# Patient Record
Sex: Female | Born: 1935 | Race: White | Hispanic: No | Marital: Married | Smoking: Never smoker
Health system: Southern US, Community
[De-identification: ages and names within clinical notes are randomized; demographics above are authoritative.]

## PROBLEM LIST (undated history)

## (undated) DIAGNOSIS — H3530 Unspecified macular degeneration: Secondary | ICD-10-CM

## (undated) DIAGNOSIS — M810 Age-related osteoporosis without current pathological fracture: Secondary | ICD-10-CM

## (undated) DIAGNOSIS — I350 Nonrheumatic aortic (valve) stenosis: Secondary | ICD-10-CM

## (undated) DIAGNOSIS — M1990 Unspecified osteoarthritis, unspecified site: Secondary | ICD-10-CM

## (undated) DIAGNOSIS — F329 Major depressive disorder, single episode, unspecified: Secondary | ICD-10-CM

## (undated) DIAGNOSIS — I4819 Other persistent atrial fibrillation: Secondary | ICD-10-CM

## (undated) DIAGNOSIS — I472 Ventricular tachycardia, unspecified: Secondary | ICD-10-CM

## (undated) DIAGNOSIS — I341 Nonrheumatic mitral (valve) prolapse: Secondary | ICD-10-CM

## (undated) DIAGNOSIS — M549 Dorsalgia, unspecified: Secondary | ICD-10-CM

## (undated) DIAGNOSIS — J320 Chronic maxillary sinusitis: Secondary | ICD-10-CM

## (undated) DIAGNOSIS — R001 Bradycardia, unspecified: Secondary | ICD-10-CM

## (undated) DIAGNOSIS — N183 Chronic kidney disease, stage 3 unspecified: Secondary | ICD-10-CM

## (undated) DIAGNOSIS — S68119A Complete traumatic metacarpophalangeal amputation of unspecified finger, initial encounter: Secondary | ICD-10-CM

## (undated) DIAGNOSIS — I10 Essential (primary) hypertension: Secondary | ICD-10-CM

## (undated) DIAGNOSIS — E785 Hyperlipidemia, unspecified: Secondary | ICD-10-CM

## (undated) DIAGNOSIS — M353 Polymyalgia rheumatica: Secondary | ICD-10-CM

## (undated) DIAGNOSIS — I252 Old myocardial infarction: Secondary | ICD-10-CM

## (undated) DIAGNOSIS — F32A Depression, unspecified: Secondary | ICD-10-CM

## (undated) DIAGNOSIS — K219 Gastro-esophageal reflux disease without esophagitis: Secondary | ICD-10-CM

## (undated) DIAGNOSIS — S72001A Fracture of unspecified part of neck of right femur, initial encounter for closed fracture: Secondary | ICD-10-CM

## (undated) DIAGNOSIS — K922 Gastrointestinal hemorrhage, unspecified: Secondary | ICD-10-CM

## (undated) DIAGNOSIS — I5181 Takotsubo syndrome: Secondary | ICD-10-CM

## (undated) DIAGNOSIS — T39395A Adverse effect of other nonsteroidal anti-inflammatory drugs [NSAID], initial encounter: Secondary | ICD-10-CM

## (undated) DIAGNOSIS — E222 Syndrome of inappropriate secretion of antidiuretic hormone: Secondary | ICD-10-CM

## (undated) HISTORY — DX: Gastrointestinal hemorrhage, unspecified: K92.2

## (undated) HISTORY — DX: Ventricular tachycardia: I47.2

## (undated) HISTORY — DX: Bradycardia, unspecified: R00.1

## (undated) HISTORY — DX: Nonrheumatic mitral (valve) prolapse: I34.1

## (undated) HISTORY — DX: Chronic kidney disease, stage 3 unspecified: N18.30

## (undated) HISTORY — DX: Major depressive disorder, single episode, unspecified: F32.9

## (undated) HISTORY — PX: CARDIAC CATHETERIZATION: SHX172

## (undated) HISTORY — DX: Ventricular tachycardia, unspecified: I47.20

## (undated) HISTORY — PX: WISDOM TOOTH EXTRACTION: SHX21

## (undated) HISTORY — DX: Chronic kidney disease, stage 3 (moderate): N18.3

## (undated) HISTORY — DX: Syndrome of inappropriate secretion of antidiuretic hormone: E22.2

## (undated) HISTORY — DX: Nonrheumatic aortic (valve) stenosis: I35.0

## (undated) HISTORY — DX: Hyperlipidemia, unspecified: E78.5

## (undated) HISTORY — DX: Chronic maxillary sinusitis: J32.0

## (undated) HISTORY — DX: Complete traumatic metacarpophalangeal amputation of unspecified finger, initial encounter: S68.119A

## (undated) HISTORY — DX: Other persistent atrial fibrillation: I48.19

## (undated) HISTORY — DX: Adverse effect of other nonsteroidal anti-inflammatory drugs (NSAID), initial encounter: T39.395A

## (undated) HISTORY — DX: Takotsubo syndrome: I51.81

## (undated) HISTORY — PX: NASAL SINUS SURGERY: SHX719

## (undated) HISTORY — DX: Unspecified osteoarthritis, unspecified site: M19.90

## (undated) HISTORY — DX: Age-related osteoporosis without current pathological fracture: M81.0

## (undated) HISTORY — DX: Old myocardial infarction: I25.2

## (undated) HISTORY — DX: Dorsalgia, unspecified: M54.9

## (undated) HISTORY — DX: Unspecified macular degeneration: H35.30

## (undated) HISTORY — DX: Depression, unspecified: F32.A

## (undated) HISTORY — DX: Polymyalgia rheumatica: M35.3

---

## 1996-10-19 HISTORY — PX: ROTATOR CUFF REPAIR: SHX139

## 1999-01-20 ENCOUNTER — Other Ambulatory Visit: Admission: RE | Admit: 1999-01-20 | Discharge: 1999-01-20 | Payer: Self-pay | Admitting: Rheumatology

## 1999-12-20 ENCOUNTER — Encounter: Admission: RE | Admit: 1999-12-20 | Discharge: 1999-12-20 | Payer: Self-pay | Admitting: Specialist

## 1999-12-20 ENCOUNTER — Encounter: Payer: Self-pay | Admitting: Specialist

## 2000-01-21 ENCOUNTER — Other Ambulatory Visit: Admission: RE | Admit: 2000-01-21 | Discharge: 2000-01-21 | Payer: Self-pay | Admitting: Rheumatology

## 2000-02-13 ENCOUNTER — Encounter: Payer: Self-pay | Admitting: Rheumatology

## 2000-02-13 ENCOUNTER — Encounter: Admission: RE | Admit: 2000-02-13 | Discharge: 2000-02-13 | Payer: Self-pay | Admitting: Rheumatology

## 2000-02-18 ENCOUNTER — Encounter: Admission: RE | Admit: 2000-02-18 | Discharge: 2000-02-18 | Payer: Self-pay | Admitting: Rheumatology

## 2000-02-18 ENCOUNTER — Encounter: Payer: Self-pay | Admitting: Rheumatology

## 2001-02-04 ENCOUNTER — Other Ambulatory Visit: Admission: RE | Admit: 2001-02-04 | Discharge: 2001-02-04 | Payer: Self-pay | Admitting: Rheumatology

## 2001-02-08 ENCOUNTER — Encounter: Payer: Self-pay | Admitting: Rheumatology

## 2001-02-08 ENCOUNTER — Encounter: Admission: RE | Admit: 2001-02-08 | Discharge: 2001-02-08 | Payer: Self-pay | Admitting: Rheumatology

## 2001-02-28 ENCOUNTER — Encounter: Payer: Self-pay | Admitting: Rheumatology

## 2001-02-28 ENCOUNTER — Encounter: Admission: RE | Admit: 2001-02-28 | Discharge: 2001-02-28 | Payer: Self-pay | Admitting: Rheumatology

## 2001-05-03 ENCOUNTER — Encounter: Admission: RE | Admit: 2001-05-03 | Discharge: 2001-05-03 | Payer: Self-pay | Admitting: Rheumatology

## 2001-05-03 ENCOUNTER — Encounter: Payer: Self-pay | Admitting: Rheumatology

## 2002-03-06 ENCOUNTER — Encounter: Payer: Self-pay | Admitting: Rheumatology

## 2002-03-06 ENCOUNTER — Encounter: Admission: RE | Admit: 2002-03-06 | Discharge: 2002-03-06 | Payer: Self-pay | Admitting: Rheumatology

## 2003-02-21 ENCOUNTER — Other Ambulatory Visit: Admission: RE | Admit: 2003-02-21 | Discharge: 2003-02-21 | Payer: Self-pay | Admitting: Rheumatology

## 2003-02-27 ENCOUNTER — Ambulatory Visit (HOSPITAL_COMMUNITY): Admission: RE | Admit: 2003-02-27 | Discharge: 2003-02-27 | Payer: Self-pay

## 2003-03-12 ENCOUNTER — Encounter: Payer: Self-pay | Admitting: Rheumatology

## 2003-03-12 ENCOUNTER — Encounter: Admission: RE | Admit: 2003-03-12 | Discharge: 2003-03-12 | Payer: Self-pay | Admitting: Rheumatology

## 2003-03-22 ENCOUNTER — Ambulatory Visit (HOSPITAL_COMMUNITY): Admission: RE | Admit: 2003-03-22 | Discharge: 2003-03-22 | Payer: Self-pay | Admitting: Ophthalmology

## 2003-07-11 ENCOUNTER — Inpatient Hospital Stay (HOSPITAL_COMMUNITY): Admission: AD | Admit: 2003-07-11 | Discharge: 2003-07-23 | Payer: Self-pay | Admitting: Internal Medicine

## 2003-07-11 ENCOUNTER — Encounter: Payer: Self-pay | Admitting: Internal Medicine

## 2003-07-13 ENCOUNTER — Encounter: Payer: Self-pay | Admitting: Internal Medicine

## 2003-07-14 ENCOUNTER — Encounter: Payer: Self-pay | Admitting: Internal Medicine

## 2003-07-15 ENCOUNTER — Encounter: Payer: Self-pay | Admitting: Gastroenterology

## 2003-07-16 ENCOUNTER — Encounter: Payer: Self-pay | Admitting: Internal Medicine

## 2003-07-17 ENCOUNTER — Encounter: Payer: Self-pay | Admitting: Internal Medicine

## 2003-07-18 ENCOUNTER — Encounter: Payer: Self-pay | Admitting: Internal Medicine

## 2003-07-19 ENCOUNTER — Encounter: Payer: Self-pay | Admitting: Internal Medicine

## 2003-07-21 ENCOUNTER — Encounter: Payer: Self-pay | Admitting: Internal Medicine

## 2003-11-27 ENCOUNTER — Encounter (INDEPENDENT_AMBULATORY_CARE_PROVIDER_SITE_OTHER): Payer: Self-pay | Admitting: *Deleted

## 2003-11-27 ENCOUNTER — Inpatient Hospital Stay (HOSPITAL_COMMUNITY): Admission: EM | Admit: 2003-11-27 | Discharge: 2003-11-29 | Payer: Self-pay | Admitting: Emergency Medicine

## 2004-03-25 ENCOUNTER — Encounter: Admission: RE | Admit: 2004-03-25 | Discharge: 2004-03-25 | Payer: Self-pay | Admitting: Obstetrics and Gynecology

## 2004-10-19 DIAGNOSIS — I4819 Other persistent atrial fibrillation: Secondary | ICD-10-CM

## 2004-10-19 HISTORY — DX: Other persistent atrial fibrillation: I48.19

## 2005-01-28 ENCOUNTER — Other Ambulatory Visit: Admission: RE | Admit: 2005-01-28 | Discharge: 2005-01-28 | Payer: Self-pay | Admitting: Obstetrics and Gynecology

## 2005-03-02 ENCOUNTER — Ambulatory Visit (HOSPITAL_COMMUNITY): Admission: RE | Admit: 2005-03-02 | Discharge: 2005-03-02 | Payer: Self-pay | Admitting: Ophthalmology

## 2005-03-12 ENCOUNTER — Encounter: Admission: RE | Admit: 2005-03-12 | Discharge: 2005-03-12 | Payer: Self-pay | Admitting: Gastroenterology

## 2005-03-30 ENCOUNTER — Encounter: Admission: RE | Admit: 2005-03-30 | Discharge: 2005-03-30 | Payer: Self-pay | Admitting: Rheumatology

## 2005-04-09 ENCOUNTER — Ambulatory Visit (HOSPITAL_COMMUNITY): Admission: RE | Admit: 2005-04-09 | Discharge: 2005-04-09 | Payer: Self-pay | Admitting: Rheumatology

## 2005-04-10 ENCOUNTER — Ambulatory Visit (HOSPITAL_COMMUNITY): Admission: RE | Admit: 2005-04-10 | Discharge: 2005-04-10 | Payer: Self-pay | Admitting: Rheumatology

## 2005-04-24 ENCOUNTER — Encounter (INDEPENDENT_AMBULATORY_CARE_PROVIDER_SITE_OTHER): Payer: Self-pay | Admitting: *Deleted

## 2005-04-24 ENCOUNTER — Inpatient Hospital Stay (HOSPITAL_COMMUNITY): Admission: EM | Admit: 2005-04-24 | Discharge: 2005-05-04 | Payer: Self-pay | Admitting: Emergency Medicine

## 2005-04-28 ENCOUNTER — Ambulatory Visit: Payer: Self-pay | Admitting: Internal Medicine

## 2005-05-04 ENCOUNTER — Encounter (INDEPENDENT_AMBULATORY_CARE_PROVIDER_SITE_OTHER): Payer: Self-pay | Admitting: *Deleted

## 2005-05-07 ENCOUNTER — Ambulatory Visit (HOSPITAL_COMMUNITY): Admission: RE | Admit: 2005-05-07 | Discharge: 2005-05-07 | Payer: Self-pay | Admitting: *Deleted

## 2005-05-08 ENCOUNTER — Encounter: Admission: RE | Admit: 2005-05-08 | Discharge: 2005-05-08 | Payer: Self-pay | Admitting: *Deleted

## 2005-05-22 ENCOUNTER — Ambulatory Visit: Payer: Self-pay | Admitting: Internal Medicine

## 2005-07-13 ENCOUNTER — Ambulatory Visit: Payer: Self-pay | Admitting: Internal Medicine

## 2005-10-16 ENCOUNTER — Encounter: Admission: RE | Admit: 2005-10-16 | Discharge: 2005-10-16 | Payer: Self-pay | Admitting: Rheumatology

## 2005-10-18 ENCOUNTER — Encounter: Admission: RE | Admit: 2005-10-18 | Discharge: 2005-10-18 | Payer: Self-pay | Admitting: Orthopedic Surgery

## 2005-11-06 ENCOUNTER — Encounter: Admission: RE | Admit: 2005-11-06 | Discharge: 2005-11-06 | Payer: Self-pay | Admitting: Surgery

## 2005-11-14 ENCOUNTER — Inpatient Hospital Stay (HOSPITAL_COMMUNITY): Admission: EM | Admit: 2005-11-14 | Discharge: 2005-11-25 | Payer: Self-pay | Admitting: Emergency Medicine

## 2005-11-14 ENCOUNTER — Ambulatory Visit: Payer: Self-pay | Admitting: Physical Medicine & Rehabilitation

## 2005-11-23 ENCOUNTER — Ambulatory Visit: Payer: Self-pay | Admitting: Oncology

## 2005-12-09 ENCOUNTER — Emergency Department (HOSPITAL_COMMUNITY): Admission: EM | Admit: 2005-12-09 | Discharge: 2005-12-09 | Payer: Self-pay | Admitting: Emergency Medicine

## 2006-01-28 ENCOUNTER — Encounter: Admission: RE | Admit: 2006-01-28 | Discharge: 2006-01-28 | Payer: Self-pay | Admitting: Internal Medicine

## 2006-02-03 ENCOUNTER — Encounter: Admission: RE | Admit: 2006-02-03 | Discharge: 2006-02-03 | Payer: Self-pay | Admitting: Rheumatology

## 2006-02-09 ENCOUNTER — Encounter: Admission: RE | Admit: 2006-02-09 | Discharge: 2006-02-09 | Payer: Self-pay | Admitting: Rheumatology

## 2006-02-26 ENCOUNTER — Ambulatory Visit (HOSPITAL_COMMUNITY): Admission: RE | Admit: 2006-02-26 | Discharge: 2006-02-26 | Payer: Self-pay | Admitting: Nephrology

## 2006-02-26 ENCOUNTER — Encounter
Admission: RE | Admit: 2006-02-26 | Discharge: 2006-02-26 | Payer: Self-pay | Admitting: Physical Medicine and Rehabilitation

## 2006-03-02 ENCOUNTER — Encounter
Admission: RE | Admit: 2006-03-02 | Discharge: 2006-03-02 | Payer: Self-pay | Admitting: Physical Medicine and Rehabilitation

## 2006-03-11 ENCOUNTER — Encounter: Admission: RE | Admit: 2006-03-11 | Discharge: 2006-03-11 | Payer: Self-pay | Admitting: Radiology

## 2006-03-26 ENCOUNTER — Encounter
Admission: RE | Admit: 2006-03-26 | Discharge: 2006-03-26 | Payer: Self-pay | Admitting: Physical Medicine and Rehabilitation

## 2006-03-31 ENCOUNTER — Encounter: Admission: RE | Admit: 2006-03-31 | Discharge: 2006-03-31 | Payer: Self-pay | Admitting: Family Medicine

## 2006-04-06 ENCOUNTER — Encounter: Payer: Self-pay | Admitting: Emergency Medicine

## 2006-04-07 ENCOUNTER — Inpatient Hospital Stay (HOSPITAL_COMMUNITY): Admission: EM | Admit: 2006-04-07 | Discharge: 2006-04-09 | Payer: Self-pay | Admitting: Internal Medicine

## 2006-04-18 ENCOUNTER — Emergency Department (HOSPITAL_COMMUNITY): Admission: EM | Admit: 2006-04-18 | Discharge: 2006-04-18 | Payer: Self-pay | Admitting: Emergency Medicine

## 2006-04-22 ENCOUNTER — Inpatient Hospital Stay (HOSPITAL_COMMUNITY): Admission: EM | Admit: 2006-04-22 | Discharge: 2006-04-27 | Payer: Self-pay | Admitting: Emergency Medicine

## 2006-05-14 ENCOUNTER — Inpatient Hospital Stay (HOSPITAL_COMMUNITY): Admission: AD | Admit: 2006-05-14 | Discharge: 2006-05-20 | Payer: Self-pay | Admitting: Internal Medicine

## 2006-06-13 ENCOUNTER — Encounter
Admission: RE | Admit: 2006-06-13 | Discharge: 2006-06-13 | Payer: Self-pay | Admitting: Physical Medicine and Rehabilitation

## 2006-06-16 ENCOUNTER — Encounter: Admission: RE | Admit: 2006-06-16 | Discharge: 2006-06-16 | Payer: Self-pay | Admitting: Family Medicine

## 2006-06-18 ENCOUNTER — Encounter: Admission: RE | Admit: 2006-06-18 | Discharge: 2006-06-18 | Payer: Self-pay | Admitting: Radiology

## 2007-01-02 ENCOUNTER — Encounter: Admission: RE | Admit: 2007-01-02 | Discharge: 2007-01-02 | Payer: Self-pay | Admitting: Pain Medicine

## 2007-02-23 ENCOUNTER — Ambulatory Visit (HOSPITAL_COMMUNITY): Admission: RE | Admit: 2007-02-23 | Discharge: 2007-02-23 | Payer: Self-pay | Admitting: Rheumatology

## 2007-04-27 ENCOUNTER — Inpatient Hospital Stay (HOSPITAL_COMMUNITY): Admission: EM | Admit: 2007-04-27 | Discharge: 2007-05-02 | Payer: Self-pay | Admitting: Emergency Medicine

## 2007-05-02 ENCOUNTER — Encounter (INDEPENDENT_AMBULATORY_CARE_PROVIDER_SITE_OTHER): Payer: Self-pay | Admitting: Gastroenterology

## 2007-12-29 ENCOUNTER — Ambulatory Visit: Payer: Self-pay | Admitting: Vascular Surgery

## 2007-12-29 ENCOUNTER — Ambulatory Visit (HOSPITAL_COMMUNITY): Admission: RE | Admit: 2007-12-29 | Discharge: 2007-12-29 | Payer: Self-pay | Admitting: Cardiology

## 2007-12-29 ENCOUNTER — Encounter (INDEPENDENT_AMBULATORY_CARE_PROVIDER_SITE_OTHER): Payer: Self-pay | Admitting: Cardiology

## 2008-03-08 ENCOUNTER — Ambulatory Visit (HOSPITAL_COMMUNITY): Admission: RE | Admit: 2008-03-08 | Discharge: 2008-03-08 | Payer: Self-pay | Admitting: Rheumatology

## 2009-01-16 ENCOUNTER — Ambulatory Visit: Payer: Self-pay | Admitting: Obstetrics and Gynecology

## 2009-01-16 ENCOUNTER — Other Ambulatory Visit: Admission: RE | Admit: 2009-01-16 | Discharge: 2009-01-16 | Payer: Self-pay | Admitting: Obstetrics and Gynecology

## 2009-01-16 ENCOUNTER — Encounter: Payer: Self-pay | Admitting: Obstetrics and Gynecology

## 2009-01-21 ENCOUNTER — Ambulatory Visit: Payer: Self-pay | Admitting: Obstetrics and Gynecology

## 2009-03-25 ENCOUNTER — Ambulatory Visit (HOSPITAL_COMMUNITY): Admission: RE | Admit: 2009-03-25 | Discharge: 2009-03-25 | Payer: Self-pay | Admitting: Rheumatology

## 2010-04-21 ENCOUNTER — Emergency Department (HOSPITAL_COMMUNITY)
Admission: EM | Admit: 2010-04-21 | Discharge: 2010-04-21 | Payer: Self-pay | Source: Home / Self Care | Admitting: Emergency Medicine

## 2010-11-09 ENCOUNTER — Encounter: Payer: Self-pay | Admitting: Rheumatology

## 2011-01-04 LAB — POCT I-STAT, CHEM 8
BUN: 16 mg/dL (ref 6–23)
Calcium, Ion: 1.18 mmol/L (ref 1.12–1.32)
Chloride: 94 mEq/L — ABNORMAL LOW (ref 96–112)
Creatinine, Ser: 0.9 mg/dL (ref 0.4–1.2)
Sodium: 130 mEq/L — ABNORMAL LOW (ref 135–145)
TCO2: 27 mmol/L (ref 0–100)

## 2011-01-04 LAB — URINE MICROSCOPIC-ADD ON

## 2011-01-04 LAB — URINALYSIS, ROUTINE W REFLEX MICROSCOPIC
Glucose, UA: NEGATIVE mg/dL
Protein, ur: NEGATIVE mg/dL
pH: 6.5 (ref 5.0–8.0)

## 2011-01-04 LAB — URINE CULTURE: Colony Count: >=100000

## 2011-03-03 NOTE — Op Note (Signed)
[name], BOUDREAU      ACCOUNT NO.:  [id number]   MEDICAL RECORD NO.:  [id number]          PATIENT TYPE:  INP   LOCATION:  3741                         FACILITY:  [facility]   PHYSICIAN:  Ronald Lobo, M.D.   DATE OF BIRTH:  [birth year]   DATE OF PROCEDURE:  04/29/2007  DATE OF DISCHARGE:                               OPERATIVE REPORT   PROCEDURE:  Upper endoscopy.   INDICATION:  77 year old female who presented with a hemoglobin of 6.8,  microcytic indices, and a history of Voltaren and aspirin exposure, with  presyncope on the day of admission and a black stool yesterday.   FINDINGS:  Essentially normal exam.   PROCEDURE IN DETAIL:  The nature, purpose, and risks of the procedure  have been discussed with the patient who provided written consent.  Sedation was Versed 2 mg IV (no fentanyl).  The Pentax video endoscope  was passed under direct vision.  The vocal cords were not well seen.  The esophagus was readily entered and was endoscopically normal without  evidence of free reflux, reflux esophagitis, Mallory-Weiss tear,  varices, infection, neoplasia or any ring or stricture or significant  hiatal hernia.   The stomach contained essentially no residual and specifically no blood  or coffee ground material.  There was a little bit of erythema but no  significant gastritis and no erosions, ulcers, polyps, or masses were  seen including retroflexed view of the cardia.  The pylorus, duodenal  bulb and second duodenum similarly looked normal.   The scope was removed and the patient tolerated the procedure well.  There were no apparent complications.   IMPRESSION:  Heme positive stool with severe anemia in a patient on  ulcerogenic medications, but without any erosive changes or source of GI  bleeding seen on current examination.   PLAN:  The patient should probably have evaluation of the small bowel  and the colon, although this could be done electively on an  outpatient  basis.  I will speak with the patient and her husband about the timing  of these tests and whether she would prefer for Dr. Sammuel Cooper, Dr. Watt Climes,  or myself (all of whom have seen her) to handle this aspect of her care.           ______________________________  Ronald Lobo, M.D.     RB/MEDQ  D:  04/28/2007  T:  04/29/2007  Job:  [id number]   cc:   Ander Slade. Rockwell Alexandria, M.D.  Jeryl Columbia, M.D.

## 2011-03-03 NOTE — Consult Note (Signed)
NAMEPRESLEIGH, [name]      ACCOUNT NO.:  [id number]   MEDICAL RECORD NO.:  [id number]          PATIENT TYPE:  INP   LOCATION:  [id number]                         FACILITY:  [facility]   PHYSICIAN:  Ronald Lobo, M.D.   DATE OF BIRTH:  08-07-1923   DATE OF CONSULTATION:  DATE OF DISCHARGE:  04/28/2007                                 CONSULTATION   GASTROENTEROLOGY CONSULTATION:  Dr. Delfina Redwood, covering for Dr. Rockwell Alexandria,  asked [place] to see this 77 year old female because of melenic stool and  severe anemia.   Mrs. Jicha was admitted through the emergency room yesterday with  a hemoglobin of 6.8 (MCV 82, RDW slightly elevated 14.7)suggestive of  iron deficiency.  She had had presyncope at home, following a several-  day prodrome and abdominal pain and diarrhea which actually had resolved  by the time the patient developed presyncope.  Yesterday, she did have a  black stool, but the diarrhea prior to that had not been melenic in  character by her report.  Of note, the patient is on Arthrotec (contains  Voltaren and misoprostol).  She has never had previous endoscopic  evaluation.  She was hemoccult positive in the emergency room.   ALLERGIES:  MULTIPLE STATED MEDICATION ALLERGIES INCLUDING AVELOX  MORPHINE, DURAGESIC PATCH, KETEK, CEPHALOSPORINS, SKELAXIN, CEFOTAXIME,  SULFA DIGOXIN AND SUDAFED.   OUTPATIENT MEDICATIONS:  1. MiraLax t.i.d.  2. Protonix.  3. HCTZ.  4. Demadex.  5. Zyrtec.  6. Coreg.  7. Nasonex.  8. Calcium carbonate.  9. Daily baby aspirin.  10.Forteo.  11.Lorazepam.  12.Ibuprofen.  13.As noted, she is on Arthrotec as well.   OPERATIONS:  No prior abdominal surgery.   MEDICAL ILLNESSES:  1. History of syncope with sustained V-tach.  2. PMR.  3. SIADH.  4. Asthma.  5. Chronic back pain associated with multiple compression fractures.  6. Hypertension.  7. Dyslipidemia.  8. History of cardiomyopathy with apical ballooning syndrome last July      with  subsequent improvement.  9. Depression.  10.History of urinary retention.  11.History of atrial fibrillation.  12.History of diverticulitis.  13.History of ischemic colitis four years ago at which time she was      colonoscoped unprepped by Dr. Watt Climes. (no gross lesions seen,      proximal ischemic colitis present).  Note that prior to that,      perhaps 6 months earlier, she had had a screening colonoscopy by      Dr. Sammuel Cooper which was apparently normal, although perhaps a small      part of the colon was not seen.   HABITS:  Nonsmoker, nondrinker.   FAMILY HISTORY:  Not obtained.   SOCIAL HISTORY:  Married, husband at bedside.   REVIEW OF SYSTEMS:  See HPI.  In general, the patient has a tendency  toward constipation which is managed by MiraLax.  When the abdominal  pain developed, MiraLax was stopped, but her stools still remained least  for a period of time.  No nausea, vomiting, dysphagia and aphagia on  MiraLax.  No anorexia.  No reflux or epigastric pain symptoms.  No  fevers.   PHYSICAL EXAMINATION:  GENERAL:  A pleasant Caucasian female, somewhat  elderly and pale in appearance.  No acute distress.  She is anicteric.  There is a rather harsh 3/6 systolic murmur at the upper left sternal  border.  The chest is clear grossly.  The abdomen is quiet, soft,  nontender without masses or organomegaly.  Stool was noted to be heme-  positive in the emergency room; I repeated a rectal exam which showed  the stool to be dark brown in color, not frankly melenic.   LABORATORY DATA:  Admission hemoglobin 6.8, MCV 82, platelets 682,000,  RDW 14.7, all consistent with iron deficiency anemia.  Post transfusion  hemoglobin 9.7.  The white count 13,000 with 81 polys, 10 lymphs, 8  monocytes.  Chemistry panel pertinent for admission sodium of 126, 134  on repeat.  BUN 33 on admission with creatinine of 1.5; following  overnight hydration, BUN 20 with creatinine of one.   IMPRESSION:  1.  Severe microcytic anemia.  2. Heme-positive stool.  3. Questionable transient dark stool, not currently melenic.  4. Recent abdominal pain and diarrhea, now resolved.   DISCUSSION AND PLAN:  It appears we are dealing with two separate but  possibly related conditions, namely the abdominal pain with diarrhea and  the anemia/presyncope.  There is evidently an element of chronicity to  the anemia, although, we know that her hemoglobin was 11.2 back in  August, suggesting that there may be an element of subacute blood loss  superimposed on chronic anemia.   RECOMMENDATIONS:  1. Urgent endoscopic evaluation today to see if she is having active      bleeding or perspective bleeding source,      especially in view of aspirin and Voltaren exposure and apparently      also low-dose aspirin exposure.  2. If the endoscopy is negative, consider updated colonoscopic      evaluation and/or small bowel capsule endoscopy, although these      could be deferred to an outpatient status when the patient is      feeling stronger.           ______________________________  Ronald Lobo, M.D.     RB/MEDQ  D:  04/28/2007  T:  04/29/2007  Job:  [id number]   cc:   Ander Slade. Rockwell Alexandria, M.D.  Jeryl Columbia, M.D.

## 2011-03-03 NOTE — H&P (Signed)
[name], [name]      ACCOUNT NO.:  [id number]   MEDICAL RECORD NO.:  [id number]          PATIENT TYPE:  INP   LOCATION:  [id number]                         FACILITY:  [facility]   PHYSICIAN:  Helen Hashimoto, MD    DATE OF BIRTH:  06-23-1923   DATE OF ADMISSION:  04/27/2007  DATE OF DISCHARGE:                              HISTORY & PHYSICAL   PRIMARY PHYSICIAN:  Knute Neu, M.D.   CHIEF COMPLAINT:  Weakness and dizziness.   HISTORY OF PRESENT ILLNESS:  This is an 77 year old female with a past  medical history of multiple medical problems who presented with the  above mentioned complaints.  The patient was in her normal state of  health until yesterday when she developed weakness.  She contacted her  primary doctor yesterday who advised her to resume eating.  This patient  was not eating very well because she has been having loose stools for a  few days so she was advised to resume eating and then to contact him  again if things are not going well.  Today the patient was feeling very  weak and while she was standing in the kitchen she had lightheadedness.  She was about to pass out but she never passed out.  She denied chest  pain, denies shortness of breath.  She came into the emergency room  where her vital signs showed a temperature of 97.2, blood pressure of  117/52, pulse of 53, respiratory rate of 16.  Her hemoglobin came to be  6.8 and the patient is guaiac positive and the hospitalist was called  for further evaluation.  I spoke to her primary physician, Dr. Knute Neu.  I will go ahead and admit this patient.  Dr. Seward Carol  will follow her during the hospitalization.   PAST MEDICAL HISTORY:  1. History of syncope with sustained v-tach.  2. Polymyalgia rheumatica.  3. 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  4. Asthma.  5. Chronic back pain with multiple compression fracture in L1, L3, T12      with vertebroplasty  in multiple sites done in 05/07.  6. Hypertension.  7. Elevated cholesterol.  8. Cardiomyopathy with apical ballooning syndrome in 07/07 with      subsequent improvement.  9. 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  10.Depression.  11.History of urinary retention.  12.History of atrial fibrillation.  13.Diverticulitis with abscess.  14.Lumbar spine spinal stenosis.  15.History of ischemic colitis in 2004.  16.Hyperglycemia.   ALLERGIES:  Avelox, morphine, Duragesic patch, K-Tec, cephalosporin,  Skelaxin, cefotaxime, sulfa, digoxin, and Sudafed.   PAST SURGICAL HISTORY:  1. Rotator cuff surgery.  2. Percutaneous lesion in the left cheek.  3. Left [place] times two.  4. Intranasal ethmoidotomy.  5. Vertebroplasty times three in 05/07.   CURRENT MEDICATIONS:  1. MiraLax 17 grams three times a day.  2. Protonix 40 mg p.o. one daily.  3. Hydrochlorothiazide 12.5 mg p.o. once daily.  4. Demadex 300 mg p.o. once daily.  5. Zyrtec 10 mg p.o. once daily.  6. Coreg 12.5 mg p.o. twice daily.  7. Nasonex  one spray once daily.  8. Calcium carbonate 600 mg once daily.  9. Aspirin 81 mg p.o. once daily.  10.Forteo 20 mg.  11.Lorazepam 0.5 mg as needed.  12.Ibuprofen 400 mg as needed.   SOCIAL HISTORY:  The patient denies smoking, denies alcohol drinking.  She lives with her husband.   FAMILY HISTORY:  The family history is positive for diabetes in her  uncle and cousin.   REVIEW OF SYSTEMS:  The review of systems is as per history and  physical.   PHYSICAL EXAMINATION:  VITAL SIGNS:  Temperature of 97.2, blood pressure  is 117/52, pulse is 53, respiratory rate of 16.  GENERAL APPEARANCE:  This is an elderly Caucasian female not in acute  distress.  HEENT:  The conjunctiva showed pallor.  There is no erythema.  The  pupils are equal, reactive to light and accommodation.  Hearing is  diminished.  There is no discharge or infection.  There is no nose  discharge or  infection.  The oral mucosa is dry.  NECK:  The neck is supple, no jugular venous distention, no carotid  bruit, no lymphadenopathy, no thyromegaly, no tenderness.  CARDIOVASCULAR:  S1 and S2 are regular.  There are no murmurs, rubs, or  gallops, no thrills.  RESPIRATORY:  The patient is breathing 16-18, no use of accessory  muscles, no intercostal retractions, no dullness, no rhonchi, no rales,  no wheezes.  ABDOMEN:  The abdomen is soft, nondistended.  There is positive  tenderness in the left lower quadrant.  EXTREMITIES:  No edema, no redness, no varicose veins.  SKIN:  No rashes, no erythema.  NEUROLOGICAL:  Cranial nerves II through XII, there are no motor or  sensory deficits.   LABORATORY RESULTS:  WBC of 13.0, hemoglobin of 6.8, hematocrit of 20.5,  MCV of 82.2, and platelet count of 682.  Sodium of 126, potassium of  4.6, chloride of 97, bicarb of 24, glucose of 108, BUN of 33, creatinine  of 1.49, calcium of 8.2, albumin of 2.1.  The patient is occult blood  positive.   EKG is normal sinus rhythm at 75 beats per minute.  There is no ST  segment abnormality, no T wave abnormalities.   ASSESSMENT:  1. Presyncope:  This is most likely secondary to her gastrointestinal      bleed.  Will resuscitate with IV fluids and will watch her on      telemetry.  2. Gastrointestinal bleed:  Will admit her to telemetry floor.  I will      give her two units of packed RBCs.  Will monitor her hemoglobin q.8      hours for 24 hours and then will reevaluate.  Will keep her NPO for      now and start diet as tolerated.  Will get a gastrointestinal      consultation for a possible colonoscopy.  3. Anemia secondary to gastrointestinal bleed, as mentioned above,      will transfuse her two units and then will recheck her hemoglobin,      retransfuse if needed.  4. Hyponatremia:  This is chronic secondary to syndrome of      inappropriate antidiuretic hormone and the patient is already       receiving demeclocycline.  Will continue to follow her sodium level      during the hospitalization.  5. Renal insufficiency:  Will follow her BUN and creatinine.   DISPOSITION:  I will discontinue her aspirin otherwise will  resume her  other medications taken at home and Dr. Seward Carol will be following  her starting from tomorrow.   The total assessment time is one hour.      Helen Hashimoto, MD  Electronically Signed     NAE/MEDQ  D:  04/27/2007  T:  04/27/2007  Job:  [id number]   cc:   Helen Hashimoto, MD  Ander Slade. Rockwell Alexandria, M.D.  Ashby Dawes. Polite, M.D.

## 2011-03-06 NOTE — Discharge Summary (Signed)
[name], [name]      ACCOUNT NO.:  [id number]   MEDICAL RECORD NO.:  [id number]          PATIENT TYPE:  INP   LOCATION:  4728                         FACILITY:  [facility]   PHYSICIAN:  Dwaine Deter, M.D.DATE OF BIRTH:  [birth year]   DATE OF ADMISSION:  04/22/2006  DATE OF DISCHARGE:  04/27/2006                                 DISCHARGE SUMMARY   DISCHARGE DIAGNOSES:  1. Probable Forteo-induced nausea, vomiting, diarrhea, resolved.  2. History of GERD.  3. History of depression.  4. Hyponatremia.  5. Chronic back pain secondary to multiple compression fractures, DJD.  6. History of HSV, Type 1.  7. History of urinary retention.  8. Takotsubo cardiomyopathy.  9. Hypercholesterolemia.  10.History of polymyalgia rheumatica.   DISCHARGE MEDICATIONS:  1. Calcium carbonate 600 mg p.o. t.i.d.  2. Estrace vaginal cream, per prior to admission, topically.  3. Prednisone 2.5 mg daily.  4. Zyrtec 10 mg p.o. nightly.  5. Coreg 12.5 mg p.o. b.i.d.  6. Protonix 40 mg daily or Prilosec 20 mg daily.  7. Simethicone 80 mg orally daily p.r.n.  8. Nasonex one spray in each nostril at bedtime.  9. Xopenex 0.62 mg aerosolized three times daily.  10.Miacalcin nasal spray one spray in alternating nostrils daily.  11.Duragesic patch 15 mcg per hour topically.  Change q.3 days.  12.MiraLax 17 grams in eight ounces of water or juice b.i.d.  13.Vicodin 5/500 one orally every six hours as needed for pain.  14.Demeclocycline 150 mg orally daily at 6 p.m.   HOSPITAL COURSE:  [name] is a pleasant 77 year old  female who experienced onset of nausea, vomiting, diarrhea and had it for  about a week.  She had restarted the Forteo three to four days before and  had similar symptoms when she tried Forteo approximately one month prior to  admission.  Four to five days prior to admission she had nausea and  shortness of breath while on Forteo and was seen in the emergency  room and  demeclocycline was decreased thinking that it might be the culprit. She was  again seen several days later in the emergency room - the day prior to  admission - with nausea and had demeclocycline decreased further.  She was  taking Forteo on a daily basis during this time.  On admission she felt  weak, had mild nausea, and was having her usual chronic back pain.  She had  episode of severe weakness on the morning of admission and may have  transiently passed out.  She was brought to the emergency room still feeling  very weak and nauseous and was admitted for observation and dehydration on  exam.   1. Nausea, vomiting, diarrhea - felt to be secondary to [place].  Forteo      was discontinued and the patient had no further diarrhea during the      hospitalization.  She was treated with intravenous fluids and was able      to start eating normally with disappearance of her nausea.  Intravenous      fluids were discontinued on April 24, 2006.  2. She was admitted with mild  hyponatremia and hypokalemia with a sodium      130, potassium 3.3.  These were replaced intravenously and electrolytes      on April 24, 2006 revealed a sodium of 138 and potassium of 4.2.      Creatinine was 0.5 and BUN was 2 on April 24, 2006.  3. Her GERD was well-controlled in hospital on Protonix therapy and      depression was well-controlled as well.  4. Back pain was controlled with Duragesic and Vicodin.  5. By April 26, 2006 the patient was actually complaining of constipation      and responded well to a Dulcolax and Fleets enema.  6. By April 27, 2006 she was actually much improved and stable vital signs.      She was to be discharged not to resume Forteo at the time and Miacalcin      was instituted.  She was tolerating that well while admitted.   PLAN:  Plans are for her to be discharged home in the care of her family in  improved condition.  A wheelchair was delivered prior to discharge for her  use as  well.   Other laboratories on discharge include normal LFTs from April 22, 2006.  White cell count on April 22, 2006 was 10,000, hemoglobin was 10.7, platelet  count was 337,000.      Dwaine Deter, M.D.  Electronically Signed     RNG/MEDQ  D:  06/05/2006  T:  06/05/2006  Job:  [id number]

## 2011-03-06 NOTE — Consult Note (Signed)
[name], [name]      ACCOUNT NO.:  [id number]   MEDICAL RECORD NO.:  [id number]          PATIENT TYPE:  EMS   LOCATION:  MAJO                         FACILITY:  [facility]   PHYSICIAN:  Wenda Low, MD      DATE OF BIRTH:  Jul 29, 1923   DATE OF CONSULTATION:  04/18/2005  DATE OF DISCHARGE:  04/18/2006                                   CONSULTATION   CHIEF COMPLAINT:  Shortness of breath, back pain and feeling weak.  An 77-  year-old female with recent admission for dehydration and weakness,  discharged on April 09, 2006, came in, brought by the family.  Started having  some complaint of some smothering feeling and shortness of breath, and back  started bothering her more and generalized malaise and some headache.  She  had taken a dose of Lasix a couple of days ago because there was some  swelling in the legs and today went the [facility] walk[facility]in [facility] and was seen  and evaluated by the doc there and thought there might be CHF with a history  of it in the past and was sent to the ER for further evaluation.  In the ER,  the patient was seen by cardiology, Dr. Pernell Dupre, and her chest x-ray did  not reveal any vascular congestion.  BNP was mildly elevated at 200.  He  clinically did not think there was any overt heart failure.   REVIEW OF SYSTEMS:  She has some low-grade temperature, 99, and for the last  two days, she has been having complaint of more back pain with her chronic  worsening symptoms and some headache and malaise symptoms.  No new  medications.  The patient has chronic history of hyponatremia, on  demeclocycline 300 mg once a day started at the time of discharge when she  was here earlier in the week.   LABORATORY:  Significant for a sodium of 121, which she had had before in  the past.  Blood counts were normal.  White count was 8.3.  Normal BUN and  creatinine.  Cardiac markers were negative.   PAST MEDICAL HISTORY:  Significant for:  1.  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  2.  Chronic lower back pain.  3.  Severe DJD.  4.  Spinal stenosis.  5.  Compression fracture, status post vertebroplasty.  6.  Osteoporosis.  7.  Polymyalgia rheumatica.  8.  Chronic hyponatremia, secondary to SIADH.   MEDICATION LIST:  1.  Fortaz subcu injections once a day.  2.  Aspirin 81 mg daily.  3.  Coreg 12.5 mg b.i.d.  4.  Estrace vaginal cream once or twice a week p.r.n.  5.  MiraLax 17 gm daily.  6.  Prilosec 20 mg daily.  7.  Calcium + vitamin D.  8.  Demeclocycline 300 mg daily.  9.  Vicodin 10/500 mg 5 times a day as needed.  10. Zyrtec 10 mg daily.  11. Xopenex nebulizers p.r.n.  12. Prednisone 2.5 mg every other day.   PHYSICAL EXAMINATION:  VITAL SIGNS:  Blood pressure 150/78, temperature  98.1, 100% sats, 76 pulse and  regular, respirations 20.  LUNGS:  Clear.  CARDIAC:  Regular rate.  Normal S1 and S2.  No murmurs.  EXTREMITIES:  There was no edema.  ABDOMEN:  Soft.  NEUROLOGIC:  She was awake and alert.   LABORATORY DATA:  As mentioned above.  Chest x-ray was unremarkable.  EKG  with normal sinus rhythm.   IMPRESSION:  1.  Hyponatremia, causing her some weakness and malaise, with history of      chronic hyponatremia in a setting of Lasix dose probably worsening, no      evidence of overt heart failure.  2.  History of chronic back pain, worsening.  3.  Spinal stenosis.  4.  Anxiety disorder.   PLAN:  I will increase the demeclocycline to 300 mg b.i.d. and prednisone to  2.5 mg every day and hold all the Lasix.  Fluid restriction to 1100 mL.  Reevaluate sodium next week.  The patient will follow up with Dr. Knute Neu on Thursday, which is her scheduled appointment.  If things get  __________ , she is with her husband, and the children stay with her.  As  far as the pain, continue on Vicodin.  Increase the lorazepam to extra 1/2  tablet during the day and 1 tablet at night to help her with  anxiety.  If  she gets any worse, they will bring her to the emergency room or call back.      Wenda Low, MD  Electronically Signed     KH/MEDQ  D:  04/18/2006  T:  04/19/2006  Job:  [id number]

## 2011-03-06 NOTE — Consult Note (Signed)
[name], [name]      ACCOUNT NO.:  [id number]   MEDICAL RECORD NO.:  [id number]          PATIENT TYPE:  INP   LOCATION:  3011                         FACILITY:  [facility]   PHYSICIAN:  Sharene Butters, P.A.     DATE OF BIRTH:  12-25-1922   DATE OF CONSULTATION:  DATE OF DISCHARGE:                                   CONSULTATION   REASON FOR CONSULTATION:  Weakness, rule out multiple myelomas.   HISTORY OF PRESENT ILLNESS:  [name] is a pleasant 77 year old woman  with a history of polymyalgia rheumatica admitted with severe generalized  weakness.  SPEP was performed showing nonspecific pattern with slight  decrease in gamma globulins.  Urine protein electrophoresis showed free  light chains.  No immunofixation was done on the urine or the serum at this  time.  She has a normal hemoglobin of 12.6, normal creatinine of 0.8, and  calcium 8.6.  Clinically, she does not fit the picture of multiple myeloma,  but will rule out the possibility versus MGUS.   PAST MEDICAL HISTORY:  1.  Polymyalgia rheumatica with resurfacing in November 2006 now ANA.  2.  Status post right hip fracture December 31.  3.  L4-L5 lumbar stenosis.  4.  Hypertension.  5.  Paroxysmal atrial fibrillation in 2004.  6.  Ischemic colitis in 2004.  7.  Degenerative joint disease, severe osteoporosis, and osteoarthritis.  8.  Recurrent bronchitis.  9.  Umbilical hernia.  10. Status post left wrist fracture.  11. Peripheral neuropathy.  12. 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  13. Questionable SIADH with admission sodium 125.   PAST SURGICAL HISTORY:  Status post right rotator cuff repair, status post  hernia surgery, status post bilateral iridectomy with lens reimplantation.   ALLERGIES:  Skelaxin, Keflex, sulfa, erythromycin, morphine  sulfate.   CURRENT MEDICATIONS:  Aspirin 81 mg daily, Miacalcin one nasal spray daily,  Coreg 12.5 mg daily, Prinivil 10 mg daily, Claritin 10 mg daily, Magic  mouthwash 5 mL t.i.d., Nasonex daily, Protonix 40 mg daily, prednisone 10 mg  daily, Tylenol p.r.n., Flexeril 10 mg t.i.d. p.r.n., Milk of Magnesia daily  p.r.n.   REVIEW OF SYMPTOMS:  Remarkable for night chills, no night sweats.  She also  has fatigue.  She has had a 10 pound weight loss over the last two weeks.  She does not have anorexia.  She has decreased acuity bilaterally in the  vision field.  No shortness of breath.  She has mild dyspnea on exertion and  deconditioning.  No dysphagia.  She has chronic low back pain and proximal  muscular weakness, more pronounced on the right than on the left in both  upper and lower extremity areas.  The rest of the review of systems is  negative.   FAMILY HISTORY:  Mother died with acute myeloleukemia at 3.  Father died at  48 of old age.  One sister alive with chronic diverticulitis, one  brother  alive with prostate cancer.   SOCIAL HISTORY:  The patient is married.  She has four children and several  grandchildren and great grandchildren.  She is a retired Engineer, site.  No alcohol or tobacco history.  She lives in [place].   PHYSICAL EXAMINATION:  GENERAL:  This is a well developed 77 year old white female in no acute  distress, alert and oriented x 3.  She is very frail.  VITAL SIGNS:  Blood pressure 146/75, pulse 74, respirations 20, temperature  97.2, pulse oximetry 95% on room air.  HEENT:  Normocephalic, atraumatic, PERRLA, oral mucosa without thrush or  lesions.  NECK:  Supple, no cervical or supraclavicular masses.  LUNGS:  Clear to auscultation in the upper levels, she has decreased breath  sounds on the right.  No axillary masses.  BREASTS:  Without masses.  HEART:  Regular rate and rhythm with 1/6 systolic murmur, no rubs or  gallops.  ABDOMEN:  Soft,   nontender, bowel sounds x 4, no palpable spleen or liver.  GU AND RECTAL:  Deferred.  EXTREMITIES:  No cyanosis, clubbing, and edema.  No bruising, no petechiae.  She has significant arthritic changes in her hands.  NEUROLOGICAL:  Remarkable for upper and lower extremity proximal weakness,  more pronounced on the right.  In the lower extremities, she has 4/5  strength.   LABORATORY DATA:  Hemoglobin 12.6, hematocrit 35.6, white count 12.2,  platelets 243, neutrophils 11.5, monocytes 0.5, lymphocytes 0.5, MCV 88.6.  Protime 12.1, INR 0.9.  Sed rate 14, dropped after the use of prednisone,  initially was 140.  TSH 0.030, T4 1.43, T3 1.43, B12 1574.  Sodium 128,  potassium 4.7, BUN 22, creatinine 0.8, glucose 138.  Not available is the  complete metabolic panel.  Her calcium is 8.6.  Thyroid globulin antibody  less than 30, thyroid antibody 52.1, prolactin 10.8, UPEP remarkable for  free chain of 0.89, kappa chain of 8.02, 24 hour total protein 9.6, alpha 1  5.2, alpha 2 16.7, gamma 9.5.  Blood cultures negative.   ASSESSMENT:  Dr. Humphrey Rolls has seen and evaluated the patient and the chart has  been reviewed.  This is an 77 year old with polymyalgia rheumatica admitted  with severe generalized weakness.  We were asked to see the patient  regarding recommendations for workup of multiple myeloma.   RECOMMENDATIONS:  1.  Check her serum urine immunofixation.  2.  Serum light chains.  3.  Quantitative immunoglobulins.  4.  The patient will eventually need a bone marrow biopsy and aspiration for      definite diagnosis.  5.  Her condition has been discussed with the patient and her daughter.      Urine protein electrophoresis shows these free light chains but her SPEP      reveals nonspecific patterns with slightly decreased gamma globulins.      Will await immunofixation results in urine and serum.  She had a normal     hemoglobin of 12.6, normal creatinine, and calcium.  Clinically, she       does not fit the picture of multiple myeloma.  Would certainly check the      possibility of MGAS.   Thank you very much for allowing [place] the opportunity to participate in the  care of [name].  We will follow with you.      Sharene Butters, P.A.     SW/MEDQ  D:  11/18/2005  T:  11/18/2005  Job:  [id number]  cc:   Dwaine Deter, M.D.  Fax: [phone]   Fabio Asa, M.D.  Fax: [phone]   Cletis Athens, M.D.  Fax: [place] Rockwell [place], M.D.  Fax: [phone]

## 2011-03-06 NOTE — H&P (Signed)
[name], [name]      ACCOUNT NO.:  [id number]   MEDICAL RECORD NO.:  [id number]          PATIENT TYPE:  INP   LOCATION:  [id number]                         FACILITY:  [facility]   PHYSICIAN:  Dwaine Deter, M.D.DATE OF BIRTH:  05/17/1923   DATE OF ADMISSION:  11/14/2005  DATE OF DISCHARGE:                                HISTORY & PHYSICAL   CHIEF COMPLAINT:  Generalized severe weakness.   PRIMARY CARE PHYSICIAN:  Dr. Knute Neu.   CARDIOLOGY:  Dr. Fabio Asa.   GASTROENTEROLOGY:  Dr. Cletis Athens.   HISTORY OF PRESENT ILLNESS:  [name] is a very pleasant 77 year old  female with multiple medical issues, who presents with progressive weakness  over the past 1-2 weeks with inability to sit up or stand. Weakness seems to  involve her proximal lower extremity muscles more, but upper extremities are  also involved. She is admitted for further work up.   The patient has the following pertinent issues:   1.  Probable PMR - Diagnosed November, 2006. Presented with proximal muscle      stiffness and weakness - currently on Prednisone 10 mg daily but has      missed the last 2 days of dosing.  2.  Question of right hip fracture on MRI on December, 31, 2006.  3.  Mild L3-4 lumbar stenosis per MRI on lumbar spine October 18, 2005.  4.  Diagnosis of takatsubo cardiomyopathy - apical ballooning syndrome -      with ejection fraction of 10-15% in July, 2006 with excellent recovery      but September, 2006 with EF of 55%. Still has cardiomegaly.   The patient developed a URI symptoms approximately 1 week ago and started Z-  Pak 5 days ago. She had symptoms of cough, sinus and chest congestion.  Denies fever. Over the past 48-72 hours she has had progression of  generalized weakness, lower extremities greater than upper extremities. She  has not taken Prednisone in more than 48 hours and stated that she forgot to  take it. The patient was seen a day prior at  [facility]  and found to have a sodium of 125 with a chloride of 93, bicarb 21, glucose  195. Thought to possibly have SIADH though p.o. fluid intake had been poor  and the patient was instructed to further fluid restrict over the past 48  hours. Today she is complaining of right low back pain and diffuse weakness.  She cannot sit or stand or walk. She had an epidural injection approximately  5 days ago in her left lower Spine per Dr. Nelva Bush, by history. Weakness  seemed to progress more after that injection though the left low back pain  has improved. Denies inability to urinate or defecate.   PAST MEDICAL HISTORY:  1.  Hypertension.  2.  Takatsubo cardiomyopathy with normal coronary arteries in July, 2006      with an ejection fraction of 10-15%.  3.  Degenerative joint disease of the lumbar spine.  4.  History of atrial fibrillation in 2004.  5.  Polymyalgia rheumatica diagnosed in November of 2006 and apparently had  a sed rate of 140 mm/Hr.  6.  History of ischemic colitis in 2004 treated by Dr. Mindi Curling.  7.  Osteoporosis.  8.  Degenerative joint disease in multiple joints - hips, knees, shoulders,      cervical spine.  9.  Recurrent bronchitis with chronic post nasal drip.  10. Umbilical hernia. Planned surgery in the near future per Dr. Johnathan Hausen.  11. Left wrist fracture in the past.  12. Cardiac arrest in July, 2006, 2 days after she was diagnosed with her      cardiomyopathy. V-tach spontaneously resolved and she wore a      defibrillator vest for several months thereafter.   PAST SURGICAL HISTORY:  1.  Right rotator cuff surgery - Dr. Lindwood Qua.  2.  Sinus surgery in the distant past.   ALLERGIES:  AVELOX, [place] - caused severe weakness. There is a  questionable history to CEPHALOSPORIN, CEFOTAXIME AND SULFA.   CURRENT MEDICATIONS:  1.  Zyrtec 10 mg daily.  2.  Darvocet N 100 1-2 p.o. p.r.n.  3.  Lasix 20 mg daily but  recently had been held.  4.  Lisinopril & hydrochlorothiazide 10/12.5 mg daily.  5.  Prilosec 20 mg daily.  6.  Coreg 12.5 mg b.i.d.  7.  Aspirin 81 mg daily.  8.  Miacalcin nasal spray, alternating nostrils daily.   FAMILY HISTORY:  Significant for mother died of leukemia at age 22. Father  died at age 65-1/2 of old age.   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 She is married and her husband is 63 years old in good  health. He is status post CABG. She has had four children and one is present  in the emergency room and very helpful. No tobacco use and just occasional  alcohol use. She exercised regularly until July, 2006 when she was diagnosed  with cardiomyopathy.   REVIEW OF SYSTEMS:  Profound weakness generally. Denies fevers or chills. It  requires a lot of effort to move her extremities by history. No recent  vaccinations. Cannot walk secondary to weakness. She does complain of right  low back pain of the paraspinal muscles.   PHYSICAL EXAMINATION:  GENERAL:  An alert female complaining of weakness.  VITAL SIGNS:  Blood pressure is 148/66, pulse 66 and regular. Respiratory  rate 22 and non labored, O2 saturations 99% on room air.  HEENT EXAM:  Atraumatic, normocephalic. Cranial nerves intact.  NECK:  Supple without JVD or thyromegaly. Carotids are 2+ bilaterally.  CHEST:  Clear to auscultation.  CARDIAC EXAM:  Reveals a regular rhythm without murmurs, rubs, or gallops.  ABDOMEN:  Soft, nontender with a positive umbilical hernia. No obvious  hepatomegaly or splenomegaly.  PELVIC/RECTAL:  Deferred.  EXTREMITIES:  Without edema or rashes. They are warm distally. Good  capillary refill.  NEUROLOGIC:  Oriented x3. She is alert. Cranial nerves II-XII grossly  intact. She has no muscle atrophy in her extremities. She has 4 about of 5 strength in her quads and ham strings bilaterally, approximately 5 out of 5  strength in her feet  and leg muscles. Reflexes are decreased throughout and  hard to elicit. Toes did not tend to plantar flex with stimulation of her  upper soles of her feet.  BACK EXAM:  Reveals moderate tenderness in the right paraspinal muscles but  no erythema, swelling of tenderness. No spinal pain to percussion.  LABORATORY DATA:  White count is 12,200, hemoglobin 12.6, platelet count  242,000, 92% neutrophils - she is on Prednisone. Protime is 12.1. INR is  0.9. Urinalysis reveals specific gravity 1.028 but otherwise normal. B-MET,  CK, sed rate and SPEP, UI EP, Thyroid function pending and B12 level  pending.   ASSESSMENT:  An 77 year old female with complaint of progressive increased  weakness over the past 1-2 weeks with an acute increase in weakness for  several days. She apparently had a sodium of 125 in our office on November 12, 2005 and was told to restrict fluids further. BUN was 21 and creatinine  1 at the time. A lot of this may simply be further dehydration and even  further hyponatremia. Need to consider if her recent epidural injection has  had a detrimental effect on her lumbar nerves. Serum sodium could be further  decreased in that she is on Lisinopril & hydrochlorothiazide but Lasix had  been held.   PLAN:  1.  Check B-MET currently and hydrate gently with normal saline overnight at      70 cc/Hr.  2.  Neurology consult to further assess weakness.  3.  History of PMR - Give Solu-Medrol 20 mg intravenously X1 then resume      Prednisone in a.m. at 15 mg daily.  4.  History of allergies - aware.  5.  Hold Zyrtec and check liver function tests. Check CK, sedimentation      rate, TSH, free T4, B12, SPEP, and UI EP and electrophoresis. Rule out      myeloma, etc.  6.  PT/OT to assist.  7.  MRI of lumbar spine.   I appreciate Dr. Gwyndolyn Saxon Hickling's evaluation of the patient as well.      Dwaine Deter, M.D.  Electronically Signed     RNG/MEDQ  D:  11/14/2005  T:   11/15/2005  Job:  [id number]   cc:   Ander Slade. Rockwell Alexandria, M.D.  Fax: [phone]   Isabel Caprice Hassell Done, [place] [address], [address]  [place]  [place] [address]   Fabio Asa, M.D.  Fax: [phone]   Princess Bruins. Gaynell Face, M.D.  Fax: [phone]

## 2011-03-06 NOTE — H&P (Signed)
[name], [name]      ACCOUNT NO.:  [id number]   MEDICAL RECORD NO.:  [id number]           PATIENT TYPE:   LOCATION:                               FACILITY:  [facility]   PHYSICIAN:  Dwaine Deter, M.D.  DATE OF BIRTH:   DATE OF ADMISSION:  04/22/2006  DATE OF DISCHARGE:                                HISTORY & PHYSICAL   CHIEF COMPLAINT:  Nausea with vomiting and diarrhea this week with  subsequent weakness.  The patient may have had momentary syncope this  morning.   HISTORY OF PRESENT ILLNESS:  [name] is an 77 year old female with  relatively extensive past medical history.  She has a history of:  1.  Polymyalgia rheumatica; 2.  [place] with hyponatremia; 3.  Asthma; 4.  Chronic  back pain with multiple compression fractures at levels of L1, L3, and T12  with vertebroplasties at those sites in May of 2007; 5.  Hypertension; 6.  Hypercholesterolemia; 7.  Tacotsubo cardiomyopathy (apical ballooning  syndrome) in July 2007 with marked improvement subsequently; 8.  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  Depression; 10.  History of  urinary retention; 11.  History of atrial fibrillation.   [name] was admitted from June 20 to April 09, 2006 to [facility] with weakness and dehydration.  She was found to be hyponatremic at  that time.   She was also diagnosed earlier this year with [facility] and treated with  Demeclocycline.  She had been on 300 mg of Demeclocycline chronically.   The patient had fairly severe diarrhea several days prior to admission with  nausea as well and both that hospitalization and this hospitalization seemed  to be also preceded by the administration of Forteo subcutaneously in the  week prior to symptoms.   The patient had onset of nausea, vomiting, and diarrhea about a week ago and  had restarted Forteo 3-4 days prior to that as mentioned.   The patient was seen both in the [facility] clinic and the emergency  room in  the last several days with shortness of breath, back pain, malaise, and  headache.  She was sent to the emergency room and evaluated by Dr. Tamala Julian.  It felt like she was not in heart failure, but she was still hyponatremic.  She was felt to be somewhat dehydrated and Lasix was discontinued from  several days previous when she had leg edema.   Her Demeclocycline was increased from 300 mg daily to twice daily and she  was placed on fluid restriction on July 1 and Forteo was restarted also at  the end of last week.   She presents now with nausea, usual back pain, after having some vomiting  and diarrhea over the last several days and she had a severe episode of  weakness this morning and was felt to have possibly passed out and came to  the emergency room.  She continues to be very weak with nausea and she is  now admitted for observation, dehydration, and to rehydrate her.  P.o.  intake has been very poor over the last 4-5 days.  There has  been some  question as to whether or not she is having nausea and vomiting secondary to  the increased dose of Demeclocycline and/or the Forteo.   MEDICATIONS:  1.  Forteo subcutaneously once daily.  2.  Aspirin 81 mg a day.  3.  Coreg 12.5 mg b.i.d.  4.  Simethicone 80 mg three times a day.  5.  Esterase vaginal cream 0.1% three times a week.  6.  MiraLax 1-2 scoops daily.  7.  Milk of magnesia p.r.n.  8.  Prilosec over the counter 20 mg daily.  9.  Calcium 600 mg Plus D b.i.d.  10. Prednisone 2.5 mg every other day.  11. Demeclocycline 300 mg b.i.d, but this has been reduced back to 300 mg      daily.  12. Hydrocodone with acetaminophen five times a day.  13. Zyrtec 10 mg a day.  14. Dicyclomine 10 mg three times a day.  15. Nasacort AQ one puff daily.  16. Lorazepam 0.5 mg one half tablet t.i.d. p.r.n.  17. Xopenex 0.63 mg t.i.d. p.r.n.  18. Antifungal powder to the groin two times a day p.r.n.   PAST MEDICAL HISTORY:  Other past  medical history not mentioned includes:  1.  Diverticulitis with abscess in 1996.  2.  Syncope with sustained ventricular tachycardia in September 2006.  3.  Atrial fibrillation.  4.  Lumbar spinal stenosis.  5.  Ischemic colitis in 2004.  6.  Hyperglycemia.   ALLERGIES:  1.  SKELAXIN.  2.  CEPHALOSPORIN.  3.  SULFA.  4.  CLITHROMYCIN.  5.  AVELOX.  6.  KETEK.   PAST MEDICAL HISTORY:  1.  Rotator cuff surgery in the past.  2.  Precancerous lesion of the left cheek.  3.  Left Caldwell-Luc x2.  4.  Intranasal ethmoidectomy.  5.  Vertebroplasties x3 in May of 2007.   SOCIAL HISTORY:  She does not smoke or drink.  She lives with her husband at  home.  Attentive family.   FAMILY HISTORY:  Family history of diabetes, colon cancer in uncle and  cousin.   REVIEW OF SYSTEMS:  Feels weak, no chest pain.  Back pain is as usual.  She  felt no chest pain or palpitations earlier this morning.   PHYSICAL EXAMINATION:  GENERAL:  She is an alert female who appears  fatigued.  She is oriented x3.  VITAL SIGNS:  Blood pressure 145/55, pulse 71 and regular, respiratory rate  16 and nonlabored, O2 saturation on room air is 97%.  HEENT:  Oropharynx is dry.  NECK:  Supple without JVD or thyromegaly.  CHEST:  Clear to auscultation.  CARDIAC:  Regular rhythm.  No murmurs or gallops.  ABDOMEN:  Soft, nontender.  No protuberance noted.  Bowel sounds are normal.  EXTREMITIES:  Without edema.  NEUROLOGIC:  Nonfocal.  SKIN:  Without rashes.   LABORATORY DATA:  Chest x-ray reveals COPD and scars in the bases, but no  active disease.  No evidence of CHF.  EKG reveals normal sinus rhythm at 67,  normal EKG.   White count is 10,300, hemoglobin 10.7, platelet count 337.  Sodium 130,  potassium 3.3, chloride 98, bicarb 24, BUN 6, creatinine 0.8, glucose 103.   ASSESSMENT:  An 77 year old female with one week of nausea, vomiting, and some diarrhea.  She has chronic hyponatremia secondary to [facility].   She may be  having intolerance to both Forteo and/or increased dose of Demeclocycline.   PLAN:  1.  Nausea,  vomiting, and diarrhea.  Discontinue Forteo and hold      Demeclocycline and gently hydrate with IV normal saline with potassium      supplementation with follow-up BMET.  2.  GERD.  Continue PPI therapy.  3.  Depression.Aware.  4.  Hyponatremia.Aware.  5.  Back pain.  Continue Hydrocodone and Tylenol.  6.  COPD/asthma.  Continue Xopenex t.i.d.      Dwaine Deter, M.D.  Electronically Signed     RNG/MEDQ  D:  04/22/2006  T:  04/22/2006  Job:  [id number]   cc:   Ander Slade. Rockwell Alexandria, M.D.  Fax: [phone]

## 2011-03-06 NOTE — Discharge Summary (Signed)
[name], [name]                ACCOUNT NO.:  [id number]   MEDICAL RECORD NO.:  [id number]                   PATIENT TYPE:  INP   LOCATION:  6523                                 FACILITY:  [facility]   PHYSICIAN:  Delanna Ahmadi, M.D.               DATE OF BIRTH:  September 11, 1923   DATE OF ADMISSION:  11/26/2003  DATE OF DISCHARGE:  11/29/2003                                 DISCHARGE SUMMARY   REASON FOR ADMISSION:  The patient is a 77 year old white female who felt  light-headedness, nausea and diaphoresis after getting up from dinner.  She  felt the pain across her upper back and tightness in the center of her  chest.  It felt like he heart was racing and irregular.  She has had a  recent asthmatic bronchitis which had been treated with Ketek, albuterol  q.i.d. and a prednisone taper.  She had no history of cardiac problems.  In  the emergency room she was found to be in atrial fibrillation with a rapid  ventricular response.   FINDINGS:  At admission in general she appeared comfortable.  Her blood  pressure was 122/71.  Heart rate was 149.  Respirations 20, temperature  97.1, oxygen saturation was 96% on room air.  Lungs clear.  Heart was  irregularly irregular without murmurs, rubs or gallops.  Extremities had no  edema.   LABORATORY DATA:  At admission CBC with white blood cell count 13.6,  hemoglobin 12.9, platelet count 420,000.  Pro Time 12.1, PTT 23.  Sodium  132, potassium 4.3, chloride 101, bicarbonate 27, glucose 183, BUN 30,  creatinine 1.3, calcium 9.0, total protein 6.9, albumin 3.7, magnesium 2.1,  AST 21, ALT 31, alkaline phosphatase 52, total bilirubin 0.6.  CK 66, CK-MB  3.8.  Troponin-I 0.05.  chest x-ray showed no acute disease.  Electrocardiogram atrial fibrillation with rapid ventricular response and  inferior lateral ST depressions.   HOSPITAL COURSE:  PROBLEM #1:  NEW ONSET ATRIAL FIBRILLATION:  The patient  was admitted with new onset atrial  fibrillation.  Her troponin did elevate  to a level of 0.32.  She was placed on Diltiazem intravenously with good  control of her rate.  She was started on Lovenox and aspirin.  Her albuterol  was discontinued.  She was seen by Dr. [name] of the cardiology service.  The patient's Diltiazem was tapered and discontinued and she was started on  a higher dose of Inderal.  Echocardiogram was done and showed normal left  ventricular function and no evidence of wall motion abnormalities.  Ejection  fraction was estimated to be between 55 to 65%.  There was mild focal basal  septal hypertrophy and Doppler evidence of a dynamic left ventricular mid  cavity extraction with a peak velocity of 2.4 mm per second and a gradient  of 23 mmHg.  Mild MAC.  Left atrium and right atrial sizes were normal.   A TSH level was normal.  A lipid profile was also within normal limits.   The patient had a cardiac catheterization on November 28, 2003 which showed  normal LV with ejection fraction of 65% and mid cavity obliteration and  normal coronary arteries.  The patient was started on Coumadin which will be  followed up as an outpatient.  Her Inderal was changed to 40 mg b.i.d.  She  converted to normal sinus rhythm on the second hospital day and remained in  sinus rhythm.  She will follow up with Dr. [name] in two weeks and with the  Coumadin clinic in one day.   PROBLEM #2: DEHYDRATION:  The patient did have some mild azotemia at  admission which was improving with gentle intravenous fluids at discharge.   PROBLEM #3: ASTHMATIC BRONCHITIS:  The patient's albuterol was discontinued.  Her prednisone was tapered down. Her asthmatic bronchitis appeared to be  resolving at discharge.   DISCHARGE DIAGNOSES:  1. Atrial fibrillation with rapid ventricular response.  2. Asthmatic bronchitis.  3. Dehydration.  4. Vascular headaches.  5. Degenerative joint disease.  6. Osteoporosis.   PROCEDURE:  1.  Echocardiogram.  2. Cardiac catheterization.   DISCHARGE MEDICATIONS:  1. Inderal 40 mg b.i.d.  2. Coumadin as directed.  3. Prednisone 20 mg daily for two days, 10 mg daily for two days and then     discontinue.  4. Fosamax 70 mg q. Week.  5. Calcium 600 mg b.i.d.  6. Ocuvite.  7. Albuterol and aspirin were discontinued.   DISPOSITION:  The patient is discharged to home.   DIET:  Regular diet.   ACTIVITY:  As tolerated.   FOLLOW UP:  Follow up in one day at the Coumadin clinic.  Follow up in two  weeks with Dr. [name].                                                Delanna Ahmadi, M.D.    Elijah Birk  D:  11/29/2003  T:  11/29/2003  Job:  [id number]   cc:   Fabio Asa, M.D.  [place] [place] [address]  Fax: [phone]   Ander Slade. Rockwell Alexandria, M.D.  [place]  [place] [address]  Fax: [phone]

## 2011-03-06 NOTE — Cardiovascular Report (Signed)
NAMECORNELLA, [name]      ACCOUNT NO.:  [id number]   MEDICAL RECORD NO.:  [id number]          PATIENT TYPE:  INP   LOCATION:  2314                         FACILITY:  [facility]   PHYSICIAN:  Peter M. Martinique, M.D.  DATE OF BIRTH:  1923-03-22   DATE OF PROCEDURE:  04/24/2005  DATE OF DISCHARGE:                              CARDIAC CATHETERIZATION   INDICATIONS FOR PROCEDURE:  The patient is an 77 year old white female who  has a history of hypertension, presents with acute onset of substernal chest  pain radiating to her back and left arm associated with nausea and vomiting.  ECG shows ST elevation consistent with anterior myocardial infarction. The  patient has ongoing chest pain.   PROCEDURES:  Left heart catheterization, coronary and left ventricular  angiography.   ACCESS:  Via the right femoral artery using standard Seldinger technique.   EQUIPMENT:  6-French 4 cm right and left Judkins catheter, 6-French pigtail  catheter, 6-French arterial sheath.   MEDICATIONS:  Local anesthesia with 1% Xylocaine. The patient was given  morphine 2 mg IV. She was on a IV nitroglycerin drip at the time of  procedure.  ACT at the end of the procedure was 176.   CONTRAST:  125 cc of Omnipaque.   HEMODYNAMIC DATA:  Aortic pressures 113/68 with a mean of 89 mmHg. Left  ventricle pressure is 146 with an EDP of 33 mmHg. By pullback there is a 30-  35 mm gradient which appears to be localized to the left ventricular outflow  tract.   ANGIOGRAPHIC DATA:  The left coronary arises and distributes normally. The  left main coronary artery is normal.   The left anterior descending artery on initial imaging, there appeared to be  TIMI grade II flow down the LAD which subsequently did improve throughout  the remainder of the study. There were no significant lesions noted in the  LAD. She had moderate plaque up to 10-20% in the proximal and mid vessel.  There was no thrombus noted.   The left  circumflex coronary artery gives rise to three marginal vessels and  appears normal.   The right coronary arises and distributes normally. It is dominant vessel.  It has minor disease less than 10% in the proximal vessel.   The left ventricular angiography was performed in the RAO view. This  demonstrates mild left ventricular dilatation. There is akinesia of the mid  to distal left ventricular segments as well as the apex circumferentially.  The base of the heart is very hyperdynamic. Overall, ejection fraction is  estimated at 35%. The appearance of the left ventricle is characteristic for  apical ballooning syndrome. There is no mitral regurgitation noted.   FINAL INTERPRETATION.:  1.  Minor nonobstructive atherosclerotic coronary disease.  2.  Impaired left ventricular contractility and pattern consistent with      apical ballooning syndrome with akinesia of the mid to distal left      ventricular wall segments and apex. The proximal segments are      hyperdynamic.  3.  Left ventricular outflow tract gradient generated by hyperdynamic basal      left ventricular segments.  PLAN:  Recommend supportive medical therapy.       PMJ/MEDQ  D:  04/24/2005  T:  04/24/2005  Job:  [id number]   cc:   Jerrye Beavers, M.D.   Ander Slade. Rockwell Alexandria, M.D.  [place]. [address]  [place] [address]  Fax: [phone]

## 2011-03-06 NOTE — Discharge Summary (Signed)
NAMEMYAR, VANDERHEYDEN                ACCOUNT NO.:  [id number]   MEDICAL RECORD NO.:  [id number]                   PATIENT TYPE:  INP   LOCATION:  [id number]                                 FACILITY:  [facility]   PHYSICIAN:  Deborah Chalk, M.D.            DATE OF BIRTH:  28-Feb-1923   DATE OF ADMISSION:  07/11/2003  DATE OF DISCHARGE:  07/23/2003                                 DISCHARGE SUMMARY   ADMISSION DIAGNOSES:  1. Severe right flank pain.  2. Polymyalgia rheumatica.  3. Osteoporosis.   DISCHARGE DIAGNOSES:  1. Obstipation with ischemic bowel.  2. Hypertension.  3. Right back pain thought to be musculoskeletal.  4. Right foot pain thought to be due to gout.   DISCHARGE MEDICATIONS:  1. Inderal 20 mg q.i.d.  2. Lisinopril 10 mg daily.  3. Protonix 40 mg daily.  4. Ambien 5 mg q.h.s. p.r.n.  5. Lorazepam 0.5 mg b.i.d. p.r.n.  6. Tylenol 650 mg q.i.d. p.r.n.   DISCHARGE INSTRUCTIONS:  Ms. Pion was to follow a low-residue diet.  She was educated on this by a dietitian prior to discharge.  She was to use  Darvocet that she had at home occasionally for pain and also Tylenol.  She  was to call if she had any further abdominal pains.  Ms. Ben was to  follow up with Dr. Rockwell Alexandria the week of her discharge and possibly with Dr.  Sammuel Cooper within the week of discharge.  She would call the office for an  appointment.  There were no restrictions on activity.   CONSULTATIONS:  1. James L. Oletta Lamas, M.D.  [place] Silos, M.D.  3. Pharmacy for TNA.  4. Nutrition and dietary services.   HISTORY OF PRESENT ILLNESS:  Please see the full H&P for details.  Briefly,  Ms. Leiss is a 77 year old woman with a history of polymyalgia  rheumatica and osteoporosis who had been followed in the office by Ander Slade.  Rockwell Alexandria, M.D.  She was seen the day of admission by the nurse practitioner  with complaints of five days of severe right back pain.  Initially  started  on both sides and localized to the right flank.  She was much better if she  sat still and she had to see her orthopedist, Dr. Ricard Dillon, who obtained films  which were negative for a fracture.  It was thought that she had exerted  herself excessively a few days prior and pulled a muscle.  She had fallen on  her back 10 days prior to this visit but did not have pain until five days  after.  It was described as sharp, 10 out of 10, and nonradiating.  Motrin  and Vicodin were not especially helpful.  In the office, she was found to be  extremely uncomfortable when getting up and down and had a urinalysis that  was negative.  Admitted for the thought that perhaps given her osteoporosis  she had a compression fracture  despite negative x-rays a few days earlier.   SIGNIFICANT DATA:  She was afebrile on admission with blood pressure 130/78,  pulse of 64.  She had a lot of pain while moving.  Otherwise, physical exam  was unremarkable.  Hemoglobin was 11.9 with normal LFTs, lipase and amylase.  Basic metabolic profile was also okay.   HOSPITAL COURSE:  Problem 1.  OBSTIPATION:  Ms. Nest was admitted  and was scheduled for x-rays of her back.  The x-rays were remarkable for  some colonic distention especially of the cecum.  A CT scan of the abdomen  showed a definitive cecum without volvulus, increased stool in the colon and  diverticulosis.  Her abdomen was distended on admission and continued to  increase in size.  She was constipated and this was treated.  A GI  consultation was obtained and a Gastrografin enema was performed showing  sigmoid stricture.  This was known from a colonoscopy done by Cletis Athens, M.D. in May 2004.  She had colonic decompression by Jeryl Columbia,  M.D. on September 26th.  This was remarkable for severe ulcerative cecum  with obvious significant ischemic changes.  She was made NPO and started on  TNA with surgical placement of a central venous  catheter.  She remained on  TNA for a couple of days, then was able to tolerate a liquid and then a low-  residue diet.  She did not require surgical intervention and the abdominal  distention improved gradually.  An MRA of her abdomen was unremarkable.   Problem 2.  HYPERTENSION:  Blood pressures remained okay until the latter  part of hospitalization when she averaged systolic values from the Q000111Q to  170s range.  She remained on Inderal and was placed on lisinopril without  any problems.  Systolic blood pressures went down to the 130s.  She was also  given some lorazepam to help her be more calm as she was prone to anxiety  with this prolonged hospitalization.  She tolerated this well.   Problem 3.  LOW BACK PAIN:  Etiology was unclear.  Initially with the workup  for her abdomen her back pain disappeared.  However, she had increasing back  pain when she was sitting up in a chair and this was better with moving  around somewhat.  She had a heating pad placed to her back and was given  Tylenol for the pain.  Narcotics were avoided secondary to the problems with  constipation and her ischemic bowel.  She eventually got much better with  ambulation and stretching.   I did not mention it earlier, but about 8 years earlier she had a history of  a perforated diverticulitis for which she had been followed by surgery and  her gastroenterologist, Cletis Athens, M.D.  She did avoid surgery at that  time.  She was known to the surgical service who was aware of her admission  though they did not follow secondary to her problem getting better with  medical treatment.   DISCHARGE CONDITION:  Stable and much improved.   ADDITIONAL DISCHARGE INSTRUCTIONS:  She was to be followed by [facility] to assist with dietary concerns.                                                Deborah Chalk, M.D.  SEJ/MEDQ  D:  08/09/2003  T:  08/09/2003  Job:  [id number]

## 2011-03-06 NOTE — Op Note (Signed)
NAME:  [name], [name]                ACCOUNT NO.:  [id number]   MEDICAL RECORD NO.:  [id number]                   PATIENT TYPE:  OIB   LOCATION:  2860                                 FACILITY:  [facility]   PHYSICIAN:  Robert L. Groat, M.D.               DATE OF BIRTH:  [birth year]   DATE OF PROCEDURE:  03/22/2003  DATE OF DISCHARGE:                                 OPERATIVE REPORT   INDICATIONS AND JUSTIFICATIONS FOR THE PROCEDURE:  This lady has been  followed in my office for a number of years and has had recent cataract  surgery and for the last several years has had worsening dermatochalasis of  the skin of each upper eyelid with visual impairment.  Authorization was  applied for through her company and to the medical insurance to have upper  eyelid blepharoplasties and this was approved for her to have surgery  between February 14, 2003 and April 15, 2003.  Examination basically shows  moderately severe dermatochalasis of the skin of each upper eyelid and this  was confirmed with photographs and with special visual field testing showing  the reduction in the visual field when the skin is pulled up compared to  when it is allowed to relax.  Basically, she feels she needs to pull the  skin of the lid up to see better and this blocks her visual field and causes  fatigue.  The possible complications were discussed. Margin reflex distance  was about 1 mm.  After discussing this, she decided to have upper eyelid  optical blepharoplasties in order to improve her vision.  Medically, she  should be stable.   JUSTIFICATION FOR OUTPATIENT SETTING:  Routine.   JUSTIFICATION FOR OVERNIGHT STAY:  None.   PREOPERATIVE DIAGNOSIS:  Severe dermatochalasis with visual impairment.   POSTOPERATIVE DIAGNOSIS:  Severe dermatochalasis with visual impairment.   OPERATION PERFORMED:  Upper eyelid blepharoplasties.   SURGEON:  Robert L. Katy Fitch, M.D.   ANESTHESIA:  1% Xylocaine with  epinephrine.   DESCRIPTION OF PROCEDURE:  The patient arrived in the minor surgery room and  was prepped and draped in the routine fashion.  1% Xylocaine with  epinephrine was given to the skin of each upper eyelid and the skin to be  excised was carefully demarcated using a marking pencil and then excised.  Underlying fatty tissue was also  excised.  Bleeding was controlled with pressure.  After this, each wound was  carefully closed with a running 6-0 nylon suture.  Pressure patches were  applied and the patient left the minor room having done well.   FOLLOWUP CARE:  The patient will be seen in my office in the next five or  six days to have the sutures removed.  [place] Katy Fitch, M.D.    RLG/MEDQ  D:  03/22/2003  T:  03/22/2003  Job:  [id number]   cc:   Ander Slade. Rockwell Alexandria, M.D.  [place]. [address]  [place]  [place] [address]  Fax: [phone]   Dr. Verna Czech.

## 2011-03-06 NOTE — Consult Note (Signed)
NAMECHERI, [name]                ACCOUNT NO.:  [id number]   MEDICAL RECORD NO.:  [id number]                   PATIENT TYPE:  INP   LOCATION:  5159                                 FACILITY:  [facility]   PHYSICIAN:  James L. Rolla Flatten., M.D.          DATE OF BIRTH:  Oct 11, 1923   DATE OF CONSULTATION:  07/12/2003  DATE OF DISCHARGE:                                   CONSULTATION   REASON FOR CONSULTATION:  Distended abdomen.   HISTORY:  The patient is a 77 year old white female who last week had  diarrhea.  She had several days of loose watery stools.  She treated this  with Imodium.  This was approximately seven to eight days ago.  She began to  have right sides abdominal pain approximately five days ago.  This was in  the right flank and began after a water aerobics class.  She felt it was a  pulled muscle.  Somewhere in there, she began to take Vicodin and Darvocet  for the pain.  Her diarrhea resolved and she has had no good bowel  movements.  She has seen Dr. Ricard Dillon, an orthopedist, in the office who did  lumbosacral spine films and these were negative for fracture.  The pain got  worse and her abdomen became more distended.  She presented with this  history and had a CT of the abdomen showing some mild cecal dilation and a  lot of stool in the cecum and throughout the colon.  The patient has  continued to have pain that is on the right side and the right abdomen.  Her  belly has continued to be distended.  She is having some occasional gas but  not much.  She had enemas and these basically caused her to have a large  bowel movement.  She is somewhat better.  She had a lot of solid stool and  had another bowel movement this afternoon with some liquid stool but still  having pain on the right side and still having a distended abdomen.  For  these reasons, we were called to see her.   MEDICATIONS ON ADMISSION:  Prilosec, Inderal, Fosamax, calcium, Celebrex,  Vicodin,  Darvocet.   MEDICAL HISTORY:  1. She does have a history of osteoporosis.  2. Polymyalgia rheumatica.  3. Migraine headaches.  4. Hyperlipidemia.  5. Ovarian cyst, etcetera.   SURGERY:  Included:  1. Rotator cuff tear.  2. Sinus surgery.   More importantly, she has had a history of active diverticulitis and has had  a perforated diverticulum with a peri-diverticular abscess, back in 1996.  It was treated with a percutaneous drain and 15 days of antibiotics.  Did  not require surgery.   FAMILY HISTORY:  Noncontributory.   REVIEW OF SYSTEMS:  Remarkable for lack of shortness of breath or fever.  Her abdominal pain is better if she tends to have soft stools but tends  towards constipation.  She takes bran.  PHYSICAL EXAMINATION:  VITAL SIGNS:  Temperature 98.6, blood pressure  190/67, pulse 74.  GENERAL:  A pleasant, alert, oriented white female in no acute distress.  EYES:  Sclera nonicteric.  NECK:  Supple.  LUNGS:  Clear.  HEART:  Regular rate and rhythm without murmur or gallops.  ABDOMEN:  Soft but distended and slightly tympanic.  Bowel sounds are  present but diminished.   PERTINENT LABS:  Positive 4.8, calcium 9.3, BUN and creatinine normal.  Liver tests normal.  Amylase and lipase are normal.  White count 8.2.   X-rays reviewed.  The CT scan shows a dilated colon down into marked sigmoid  diverticulosis without clear diverticulitis, lots of stool throughout the  colon.  This was done yesterday.   ASSESSMENT:  1. Dilated colon, possibly secondary to obstruction of the sigmoid colon due     to stool.  She has gotten marked benefit from enemas.  This may have been     exacerbated by last week of pain medicine.  Need to rule out obstruction     of the sigmoid colon.  It is also possible this could all be due to     stool.  2. Right sided abdominal pain and back pain of questionable cause.  They may     or may not be related.  3. History of diverticulitis status  post perforation and abscess treated by     percutaneous drain in the mid 1990s.   PLAN:  We will obtain a gastrograph and enema to rule out obstruction.  This  will also be likely therapeutic.  We will also keep her n.p.o. now except  for meds.                                               James L. Rolla Flatten., M.D.    [name]  D:  07/12/2003  T:  07/14/2003  Job:  [id number]   cc:   Deborah Chalk, M.D.  [address]  [address]  Fax: [phone]   Cletis Athens, M.D.  [place]  [place]  [place] [address]  Fax: [phone]

## 2011-03-06 NOTE — Consult Note (Signed)
[name], [name]      ACCOUNT NO.:  [id number]   MEDICAL RECORD NO.:  [id number]          PATIENT TYPE:  EMS   LOCATION:  MAJO                         FACILITY:  [facility]   PHYSICIAN:  Belva Crome, M.D.   DATE OF BIRTH:  July 15, 1923   DATE OF CONSULTATION:  04/18/2006  DATE OF DISCHARGE:                                   CONSULTATION   REASON FOR EVALUATION:  Possible heart failure.   CONCLUSIONS:  1.  No evidence of congestive heart failure by clinical exam. Patient's BNP      level is pending at this time.  2.  Recurrent hyponatremia recently treated on April 09, 2006.  3.  Polymyalgia rheumatica.  4.  Steroid myopathy.  5.  Osteoporosis.  6.  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   RECOMMENDATIONS:  No specific cardiac issues seem to be present currently.  I have called the internal medicine service, Dr. Lysle Rubens, who will see the  patient. Her hyponatremia needs to be again managed.   COMMENTS:  The patient is 89-years of age and was seen in the [facility] this morning.  I am not sure who the physician was that saw her but  an x-ray was done and the family was told that the patient might have heart  failure and was sent to the [facility] ER.  The family called me and asked that I  meet the patient in the emergency room since she was a patient of Dr. Bonnita Nasuti  [facility]'s and there was concern about heart failure. I did come to the  emergency room to see the patient but upon seeing her realized that this was  not a straight forward situation.  She was hospitalized a little bit over a  week ago with hyponatremia and wheezing.  Her serum sodium was 121 at that  time.  The wheezing was treated with Xopenex and sodium was managed with  fluids. The family states that once she left the hospital she was mildly  short of breath and  over the last 2 days shortness of breath became even  more severe.  They state that last evening she was unable to lie flat because she was  short of breath but there was no wheezing this time. They spoke with Dr.  Inda Merlin on the phone and he advised them to use 40 mg of Lasix last evening,  and 20 mg this morning. When she was no better they took her to the [facility] and [facility] she has ended up here.  In speaking  with her she does not complain very much with being short of breath now. She  states that she is somewhat nervous.  She appears somewhat confused. She is  complaining of a headache. She is also complaining of abdominal discomfort  and low back discomfort.   MEDICATIONS:  She is on 18 different medications, please refer to the  patient's recent discharge summary of April 09, 2006.   PAST MEDICAL PROBLEMS:  Outlined above but also include osteoporosis and  vertebroplasty.   HABITS:  Does not smoke or drink.   FAMILY HISTORY AND REVIEW OF SYSTEMS:  Please refer to old records.   PHYSICAL EXAMINATION:  GENERAL:  On exam the patient is in no acute  distress, she is sitting on the gurney in the emergency room.  VITAL SIGNS:  Blood pressure is 140/70, heart rate 72. Temperature at [facility] [place] was 99 degrees Fahrenheit.  NECK:  There is no JVD on neck exam.  CHEST EXAM:  Reveals faint basilar crackles.  CARDIAC EXAM:  Reveals an S4 gallop, 1/6 systolic murmur, no diastolic  murmur.  ABDOMEN:  Soft, bowel sounds are normal.  EXTREMITIES:  Reveal no edema.  NEUROLOGICAL EXAM:  Reveals decreased memory and somewhat confused patient.   CHEST X-RAY:  Reveals no evidence of CHF, there is bibasilar atelectasis,  cardiac enlargement and evidence of vertebroplasty.   Sodium is 121, creatinine 0.8, other laboratory data is pending.   IMPRESSION:  1.  Hyponatremia causing headache and confusion.  2.  No clinical evidence to support congestive heart  failure at this time.  3.  BNP level is pending.   PLAN:  No specific cardiac management needs to be done at this time. I have  called Dr. Lysle Rubens who will see the patient. She will probably need to be  admitted to the hospital for further management of this recurrent  hyponatremia.      Belva Crome, M.D.  Electronically Signed     HWS/MEDQ  D:  04/18/2006  T:  04/18/2006  Job:  [id number]   cc:   Ander Slade. Rockwell Alexandria, M.D.  Fax: [phone]

## 2011-03-06 NOTE — Discharge Summary (Signed)
NAMEROWYNN, KULLBERG      ACCOUNT NO.:  [id number]   MEDICAL RECORD NO.:  [id number]          PATIENT TYPE:  INP   LOCATION:  [id number]                         FACILITY:  [facility]   PHYSICIAN:  Dwaine Deter, M.D.DATE OF BIRTH:  1923-05-13   DATE OF ADMISSION:  11/14/2005  DATE OF DISCHARGE:  11/25/2005                                 DISCHARGE SUMMARY   DISCHARGE DIAGNOSES:  1.  Probable lower extremity proximal mild myopathy -- ? etiology.  It      seemed to follow respiratory illness as well as recent epidural      injection into her L4-5 lumbar region for low back pain and degenerative      joint disease.  2.  History of probable polymyalgia rheumatica diagnosed in November 2006,      presenting with proximal muscle stiffness and weakness and currently on      a prednisone taper -- followed by Dr. Knute Neu.  3.  Mild L3-L4 lumbar stenosis per MRI of lumbar spine, October 18, 2005.  4.  History of diagnosis of Takotsubo cardiomyopathy -- apical ballooning      syndrome -- with an ejection fraction of 10% to 15% in July of 2006 with      excellent recovery per September 2006 with ejection fraction of 55%.      Still has cardiomegaly.  5.  Hyponatremia likely secondary to syndrome of inappropriate antidiuretic      hormone with etiology undetermined.  Urine sodium greater than 20 mEq/L      at 96 on November 23, 2005 and urine osmolality greater than 100 mOsm/kg      at 378 mOsm/kg.  The patient has not responded adequately to fluid      restriction and fluid restriction has been discontinued and a trial of      demeclocycline will be added.  6.  Hypertension.  7.  History of atrial fibrillation in 2004.  8.  History of ischemic colitis in 2004 -- treated by Dr. Mindi Curling.  9.  History of osteoporosis.  10. Degenerative joint disease in multiple joints including hips, knees,      shoulders and cervical spine.  11. Herpes simplex, type I, involving the left naris  -- improving after      Valtrex therapy.  12. History of allergic rhinitis.  13. Mild hyperglycemia.   DISCHARGE MEDICATIONS:  1.  Calcitonin nasal spray -- one spray in alternating nostrils daily.  2.  Carvedilol 12.5 mg p.o. twice daily.  3.  Aspirin 81 mg daily.  4.  Zyrtec 10 mg daily.  5.  Nasonex one spray in each nostril daily.  6.  Prednisone 8 mg p.o. daily and to be tapered by 1 mg per month.  7.  Darvocet-N 100 one p.o. q.4 h. p.r.n. pain.  8.  Tylenol 650 mg p.o. q.4 h. p.r.n.  9.  Demeclocycline 300 mg p.o. twice daily.   ALLERGIES:  SKELAXIN, CEPHALOSPORINS, SULFA DRUGS, CLARITHROMYCIN (BIAXIN)  and MORPHINE SULFATE.   CONSULTATIONS:  1.  Dr. Wyline Copas, November 14, 2005 -- Neurology.  2.  Hematology -- Sharene Butters, P.A. along with Dr.  Humphrey Rolls.   PROCEDURES:  EMG and nerve conduction studies performed on November 23, 2005  at bedside revealed no particular etiology for her proximal muscle weakness  in her lower extremities.  Both nerve conduction velocities and EMGs were  mildly affected at preliminary reading by Dr. Wyline Copas.   HOSPITAL COURSE:  PROBLEM #1 - PROBABLE LOWER EXTREMITY PROXIMAL MILD  MYOPATHY -- ? ETIOLOGY:  Ms. Geoffroy is a very pleasant 77 year old  female with multiple medical issues, who presented with progressive weakness  over 1-2 weeks with an initial inability to sit up or stand.  Her weakness  seemed to involve her proximal lower extremities more, but her upper  extremities seemed to be possibly involved.  She was admitted for further  workup on November 14, 2005.   Initial laboratories revealed a sodium of 125, potassium 5.1, chloride 101,  glucose mildly elevated at 142, BUN 32, creatinine 1.0.  White count was  mildly elevated at 12,200, but felt to likely be secondary to steroid use.   She had 3/5 strength in the proximal hip flexors and psoas muscles on  initial examination.   The patient was unable to walk secondary  to lower extremity weakness and  continues to have weakness in the lower extremities, though strength is  slightly improved.   The patient had extensive workup to assess her weakness including nerve  conduction studies and EMGs as mentioned above.  She did have a persistent  hyponatremia while admitted, felt to likely be secondary to SIADH with urine  sodium greater than 20 mEq/L and urine osmolality greater than 100.  However, fluid restriction was not helpful.  In fact, fluid restriction  caused azotemia and this was discontinued prior to discharge.  Again, we  will try demeclocycline to try to increase her sodium levels.   Physical therapy was initiated during her hospitalization, but at discharge  was still unable to ambulate on her own.  There was some refusal as well to  be willing to work with Physical Therapy in the last several days during the  hospitalization because of complaint of fatigue.   At discharge, she had 4/5 strength in her lower extremities in terms of her  hip flexors and extensors and other muscle groups were 4-1/2 to 5/5.  She  should receive continued encouragement to participate with physical therapy  at the nursing home.   Laboratories to assess muscle weakness included a sed rate, which was 14  mm/hr on November 14, 2005.  Myasthenia gravis panel is pending.  TSH was  suppressed at 0.031; however, free T4 was normal at 1.43; total T3 was  slightly low at 55.0; this is a mixed picture for her thyroid and she was  felt to likely be euthyroid.  B12 levels were mildly elevated at 1574 pg/mL.  SPEP and UPEP were performed, revealing kappa light chains in her urine;  however, urine immunofixation revealed no monoclonal free light chains.  Also, thyroid antibodies were within normal limits.   Again, at discharge, there was no clear etiology for her lower extremity weakness, though it did improve on exam and her resistance to ambulation is  really secondary to  more of a complaint of weakness than it is to pain.   We will need to follow up on myasthenia gravis antibody panel, but this is  not likely to be abnormal.   PROBLEM #2 - HYPERTENSION:  This was well-controlled off lisinopril HCT.  This may need to be resumed as an  outpatient, as she hopefully begins to be  more active.   PROBLEM #3 - HYPERGLYCEMIA:  The patient did have some mild hyperglycemia  while admitted and will need to be on a no-concentrated-sweet diet on  discharge.  This is felt to be secondary to her steroid therapy for  polymyalgia.   PROBLEM #4 - HISTORY OF CARDIOMYOPATHY:  The patient exhibited no signs of  CHF while hospitalized.  Her lungs were clear on exam, she had no orthopnea  and she had no peripheral edema.   PROBLEM #5 - OTHER MEDICAL ISSUES:  Other medical issues while hospitalized  were essentially stable as well.   DISPOSITION:  The patient will be discharged to [facility] for rehab and  hopefully will progress in terms of her lower extremity weakness and gait.   COMMENT:  I should mention that the patient did have a hospitalization in  July '06 when she had heart failure secondary to her cardiomyopathy and she  had hyponatremia at that time as well and this was felt to be secondary to  thiazide use.      Dwaine Deter, M.D.  Electronically Signed     RNG/MEDQ  D:  11/25/2005  T:  11/25/2005  Job:  [id number]   cc:   Marcy Panning, MD  Fax: [phone]   Ander Slade. Rockwell Alexandria, M.D.  Fax: [phone]   Princess Bruins. Gaynell Face, M.D.  Fax: [phone]   Fabio Asa, M.D.  Fax: [phone]

## 2011-03-06 NOTE — Op Note (Signed)
[name], [name]                ACCOUNT NO.:  [id number]   MEDICAL RECORD NO.:  [id number]                   PATIENT TYPE:  INP   LOCATION:  5158                                 FACILITY:  [facility]   PHYSICIAN:  Jeryl Columbia, M.D.                 DATE OF BIRTH:  1923/07/09   DATE OF PROCEDURE:  07/15/2003  DATE OF DISCHARGE:                                 OPERATIVE REPORT   PROCEDURES:  Colon decompression.   INDICATIONS:  Patient with increasing pain, abdominal girth, and increased  cecum on x-ray.  Consent was signed after risks, benefits, methods, and  options were thoroughly discussed with both the patient and her son.   MEDICINES USED:  Demerol 70 mg and Versed 7 mg.   DESCRIPTION OF PROCEDURE:  Rectal inspection was pertinent for external  hemorrhoids, small.  Digital exam was negative.  The pediatric video  adjustable colonoscope was inserted.  With some difficulty due to an  unprepped exam requiring some washing and suctioning, it was able to be  advanced to the probable right colon.  This did require some abdominal  pressure, but no position changes.  No obvious abnormality was seen,  although with the unprepped exam, only minimal amount of the mucosa was  seen.  However, in the right colon was a severe ulceration and the colon  mucosa was denuded.  We did wash some of the stool off to confirm this.  We  elected not to biopsy it.  We suctioned air once pictures and quick  visualization were obtained.  We believed we were in the right colon.  A  rather quick withdrawal concerned with washing and concern more with  suctioning air unless looking at the rest of the colon.  No obvious  abnormalities were seen.  The left side was prepped worse than the  transverse with increased liquid stool.  The scope was not retroflexed in  the rectum, but air was suctioned.  The scope was removed.  The patient  tolerated the procedure adequately.  There was no obvious  immediate  complication.   ENDOSCOPIC DIAGNOSES:  1. Unprepped exam.  2. Dilated cecum with obvious dilated probable cecum with obvious     significant ischemic changes not biopsied.  3. Suctioned air on withdrawal.    PLAN:  Sips of clears only.  Will continue MiraLax to slowly cleanse her  colon.  Follow x-rays, exams, and labs.  Might need surgical options or  antibiotics and possibly even a CT if questions continue.                                               Jeryl Columbia, M.D.    MEM/MEDQ  D:  07/15/2003  T:  07/16/2003  Job:  [id number]   cc:   Sharrah E.  Arnoldo Morale, M.D.  [place]. [address], [address] [place]  [address]  Fax: [phone]   Cletis Athens, M.D.  [place]. [address]  [place]  [place] [address]  Fax: [phone]

## 2011-03-06 NOTE — Op Note (Signed)
NAMECARIN, ASKELAND      ACCOUNT NO.:  [id number]   MEDICAL RECORD NO.:  [id number]          PATIENT TYPE:  OIB   LOCATION:  2852                         FACILITY:  [facility]   PHYSICIAN:  Clent Demark. Rankin, M.D.   DATE OF BIRTH:  Dec 18, 1922   DATE OF PROCEDURE:  03/02/2005  DATE OF DISCHARGE:                                 OPERATIVE REPORT   PREOPERATIVE DIAGNOSIS:  Epiretinal membrane, right eye.   POSTOPERATIVE DIAGNOSES:  Epiretinal membrane, right eye.   PROCEDURE:  A #25 gauge with posterior vitrectomy with membrane peel, right  eye.   SURGEON:  Clent Demark. Rankin, M.D.   ANESTHESIA:  Local retrobulbar, mild sedation, anesthesia control.   INDICATIONS FOR PROCEDURE:  The patient is an 77 year old woman who has  visually significant loss of vision in the right eye, primary epinephrine-  membrane, right eye.  This is an attempt to remove the macular membrane, so  as to allow for underlying topographic distortion to improve.  She  understands the risk of anesthesia, the rare occurrence of death, but also  to the eye including but not limited to hemorrhage, infection, scarring,  need for further surgery, no change in vision, loss of vision and  progressive disease despite intervention.  The patient understands these  risks and wishes to proceed.  She understands that visual acuity will  improve slowly over time if at all.   DESCRIPTION OF PROCEDURE:  In the operating room the appropriate monitors  followed by mild sedation with 0.75% Marcaine, an additional 5 mL  retrobulbar, followed by an additional 5 mL laterally in the fashion of  modified Kirk Ruths.  The right periocular region was sterilely prepped and  draped in the usual ophthalmic fashion.  The lid speculum was applied.  A  #25 gauge trocar system was then used to introduce the infusion cannula.  Placed in the vitreous cavity to verify visually.  The superior trocars are  then placed with the microscope in position.   A #25 gauge core vitrectomy  was then begun.  The Biome visualization system was used.  The vitreous  skirt was trimmed 360 degrees.  The #25 gauge internal limiting membrane  forceps were then used to engage the epiretinal membrane, with what appeared  to be a combination with the internal limiting membrane was removed in a  continuous sheet off the entire macular region without difficulty.  The  typical pale changes of the retina occurred with the removal of the internal  limiting membrane.  At this time further trimming of the peripheral vitreous  was carried out with the #25 gauge system.  No retinal holes or tears  occurred.  Under low infusion pressures, the trocar was removed from the  eye.  Similarly the infusion was removed from the eye.  A subconjunctival  injection with sterile antibiotic was placed inferonasally.  A sterile patch  and Fox shield were applied gently.   The patient was taken to the short-stay area, and is to be discharged home  as an outpatient, in good and stable condition.       GAR/MEDQ  D:  03/02/2005  T:  03/02/2005  Job:  [id number]

## 2011-03-06 NOTE — Consult Note (Signed)
[name], [name]      ACCOUNT NO.:  [id number]   MEDICAL RECORD NO.:  [id number]          PATIENT TYPE:  INP   LOCATION:  2004                         FACILITY:  [facility]   PHYSICIAN:  Marshall Cork. Jeffie Pollock, M.D.    DATE OF BIRTH:  [birth year]   DATE OF CONSULTATION:  05/17/2006  DATE OF DISCHARGE:                                   CONSULTATION   CHIEF COMPLAINT:  Urinary retention.   HISTORY:  [name] is an 77 year old white female I saw back in  February for urinary retention who was admitted to the hospital on July 27  with hyponatremia with nausea, vomiting and confusion.  This followed  treatment of urinary tract infection with Cipro that was started on July 20.  She had upset stomach and resultant poor appetite.  Her sodium on admission  was 116.  She has been unable to void effectively since admission and  recently was catheterized for a residual of 475 mL.  She has bladder  discomfort when her bladder is full but reports that she was not having any  voiding symptoms prior to admission.  When I had seen her in February, she  was having retention related to constipation.  Foley catheter was left  indwelling for several days, and then she was giving a voiding trial with  success.   On review of her past history, she has of polymyalgia rheumatica, SIADH with  hyponatremia, asthma, chronic back pain with multiple compression fractures.  She has had vertebroplasties at L1, L3 and T12.  She has hypertension,  hypercholesterolemia.  She has cardiomyopathy.  She has history of HSV type  1, history of depression, history of urinary retention, history of atrial  fibrillation, diverticula with abscess.   ALLERGIES:  SHE HAS ALLERGIES TO AVELOX, MORPHINE, DURAGESIC,  CEPHALOSPORINS, CEFOTAXIME, SULFA, KETEK AND SKELAXIN AS WELL AS BIAXIN AND  SUDAFED.   MEDICATIONS ON ADMISSION:  1. Prilosec 20 mg.  2. Zyrtec 10 mg daily.  3. Coreg 12.5 twice daily.  4. Aspirin 81 mg  daily.  5. Prednisone 2.5 mg.  6. Demeclocycline 150 mg daily.  7. Miacalcin nasal spray.  8. Spectravite multivitamin.  9. Nasacort 2 puffs daily.  10.Calcium carbonate 600 mg 3 times daily.  11.She takes a protein supplement.  12.Xopenex 0.63 mg t.i.d. via nebulizer.   Medical history also includes lumbar spinal stenosis with a history of  ischemic colitis and hyperglycemia.  Surgical history significant for  rotator cuff surgery, precancerous lesion of left cheek removed,  _____________ procedures x2, intranasal ethmoidectomy and the  vertebroplasties as mentioned.   SOCIAL HISTORY:  Negative for tobacco or alcohol:  She lives with her  husband.   FAMILY HISTORY:  Current for diabetes and colon cancer.   REVIEW OF SYSTEMS:  She denies constipation.  She has a little bit of lower  abdominal discomfort and has small amount of urine output when she gets  urgency but is unable to void to completion.  She is otherwise without  complaints and has improved since admission.   PHYSICAL EXAMINATION:  GENERAL:  On exam, she is a well-developed elderly  white female in  no acute distress.  ABDOMEN:  Her abdomen is soft, flat, nontender without mass,  hepatosplenomegaly, CVA tenderness.  No hernias or inguinal adenopathy are  noted.  GENITOURINARY:  Exam reveals normal external genitalia.  Urethral meatus has  caruncle which was identified at prior exam as well.  The urethra is  palpably normal.  The bladder is palpably empty without tenderness or  masses.  Cervix and uterus are atrophied.  No adnexal masses were noted.  RECTAL:  The rectum feels empty through the posterior vaginal wall.  Anus  and perineum without lesions.  EXTREMITIES:  Have full range of motion without edema.   LABORATORY STUDIES:  Urinalysis on July 28 had 11-20 white cells, 3-6 red  cells, few yeast. Blood culture is pending. BMP on July 29 demonstrated an  elevation of her sodium to 125 potassium 3.8, chloride  100, CO2 21, BUN 3,  creatinine 0.6 and glucose of 115.  I have an update on her urine culture;  clean catch urine had greater than 100,000 colonies with multiple species  and is likely a contaminant.   IMPRESSION:  1. Urinary retention with a prior history of retention.  2. Recent UTI, but abnormal urine at admission is likely a contaminant.  3. Urethral caruncle.  4. Hyponatremia with history of mental status changes.   RECOMMENDATIONS:  1. I will place Foley to straight drainage and leave for 1-2 weeks to give      bladder a chance to recover.  I would like for her to follow up in my      office for voiding trial.  2. I would obtain a cath urine to see if she truly does have an abnormal      urine smear.      Marshall Cork. Jeffie Pollock, M.D.  Electronically Signed     JJW/MEDQ  D:  05/17/2006  T:  05/18/2006  Job:  [id number]

## 2011-03-06 NOTE — Op Note (Signed)
   NAMEMURRIEL, [name]                ACCOUNT NO.:  [id number]   MEDICAL RECORD NO.:  [id number]                   PATIENT TYPE:  INP   LOCATION:  5158                                 FACILITY:  [facility]   PHYSICIAN:  Merri Ray. Grandville Silos, M.D.             DATE OF BIRTH:  10-24-1922   DATE OF PROCEDURE:  07/16/2003  DATE OF DISCHARGE:                                 OPERATIVE REPORT   PRE-PROCEDURE DIAGNOSIS:  Ischemic colitis with need for central abscess for  total nutrient admixture.   POST-PROCEDURE DIAGNOSIS:  Ischemic colitis with need for central abscess  for total nutrient admixture.   PROCEDURE:  Insertion of left subclavian vein triple-lumen catheter.   SURGEON:  Merri Ray. Grandville Silos, M.D.   HISTORY OF PRESENT ILLNESS:  The patient is an 77 year old female who is  admitted with ischemic colitis and I was asked to see her in consultation  for placement of central line for TNA.   PROCEDURE IN DETAIL:  Informed consent was obtained.  The patient's PT and  PTT were normal today.  Her left clavicular area and neck were prepped and  draped in a sterile fashion.  She was placed in Trendelenburg position with  a roll between her shoulder blades.  Subclavian vein was entered with one  stick using Seldinger technique.  A triple-lumen catheter was inserted to  about 16 cm.  There was good blood return from all three ports.  Patient  tolerated the procedure very well and we will check an x-ray of her chest.                                               Merri Ray. Grandville Silos, M.D.    BET/MEDQ  D:  07/16/2003  T:  07/16/2003  Job:  [id number]

## 2011-03-06 NOTE — Discharge Summary (Signed)
NAMEMERIBETH, BIENER      ACCOUNT NO.:  [id number]   MEDICAL RECORD NO.:  [id number]          PATIENT TYPE:  INP   LOCATION:  2004                         FACILITY:  [facility]   PHYSICIAN:  Nehemiah Settle, M.D.    DATE OF BIRTH:  12-20-1922   DATE OF ADMISSION:  05/14/2006  DATE OF DISCHARGE:  05/20/2006                                 DISCHARGE SUMMARY   DISCHARGE SUMMARY   ADMITTING DIAGNOSIS:  Hyponatremia and weakness secondary to the same.   DISCHARGE DIAGNOSIS:  1. 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  2. Nausea, vomiting secondary to Cipro being used for urinary tract      infection.  3. Recent urinary tract infection.  4. Urinary retention.  5. Weakness secondary to #1, 2, and 3.  6. History of chronic back pain with multiple compression fractures.      Status post vertebroplasties of several sites.  7. History of polymyalgia rheumatica.  8. History of syncope with sustained ventricular tachycardia.  9. History of apical ballooning syndrome with marked improvement      subsequently.  10.Depression.  11.History of atrial fibrillation.  12.Asthma.   HISTORY OF PRESENT ILLNESS:  The patient is a 77 year old white female and  this is at least her third admission this year for hyponatremia.  She had an  admission earlier this year which was treated with fluid restriction and  things improved.  The subsequent admission, she was placed on  Demeclocycline.  She developed nausea and vomiting while taking a round of  Cipro for a urinary tract infection.  Her hyponatremia worsened, she became  quite weak and she was admitted.   HOSPITAL COURSE:  The patient was admitted and initially treated with IV  fluids to re hydrate her.  This caused her sodium to improve steadily with  initial sodium of 117 and improvement over the next several days to 128.  She felt much better at 128.   The day prior to admission, she did feel  poorly and had a sodium of 120 that day.  It is unclear why it suddenly  dropped.  Once again, because it was 127 the next day without any specific  intervention.  She was feeling somewhat  better and she was able to be  discharged in improved condition.  I had a number of long conversations with  the family including etiology and treatment.  At this point, we will  continue the Demeclocycline on a regular basis.   She also has trouble with constipation and this was controlled adequately  with MiraLax.  There is no question the patient is also depressed.  I  discussed with her physician, Dr. Netta Neat, the possibility of starting her  on Cymbalta but I would like for her sodium level to remain steady for a  week or so before considering that.  She also had problems with urinary  retention.  She was seen by Dr. Roni Bread and a Foley catheter was placed and she  was asked to keep this straight drainage until a follow up visit could  be  made with him 1-2 weeks after discharge.   DISCHARGE MEDICATIONS:  MiraLax 1 capful 1-2 times a day.  Prilosec 20 mg daily.  Zyrtec 10 mg daily.  Coreg 12.5 mg b.i.d.  Prednisone 2.5 mg daily.  Aspirin 81 mg daily.  Demeclocycline 150 mg b.i.d.  Miacalcin 1 spray daily.  Spectravite daily.  Nasacort or Nasonex daily.  Calcium carbonate t.i.d.  Protein powder 1-2 times a day.  Xopenex 0.63 mg nebulizer 3 times a day as needed.   FOLLOWUP:  She will call us to make an appointment with Dr. Rockwell Alexandria in about  2-3 weeks.  She will call to make an appointment with Dr. Jeffie Pollock in 1-2  weeks.   Referrals were made to [facility] and they will do physical therapy  and she can improve her activities as tolerated.   DIET:  No restrictions.      Nehemiah Settle, M.D.  Electronically Signed     JO/MEDQ  D:  05/21/2006  T:  05/22/2006  Job:  [id number]

## 2011-03-06 NOTE — Discharge Summary (Signed)
[name], [name]      ACCOUNT NO.:  [id number]   MEDICAL RECORD NO.:  [id number]          PATIENT TYPE:  INP   LOCATION:  2001                         FACILITY:  [facility]   PHYSICIAN:  Peter M. Martinique, M.D.  DATE OF BIRTH:  [birth year]   DATE OF ADMISSION:  04/24/2005  DATE OF DISCHARGE:  05/04/2005                                 DISCHARGE SUMMARY   PRIMARY CARDIOLOGIST:  Fabio Asa, M.D.   PRIMARY CARE PHYSICIAN:  Dr. Rockwell Alexandria   CONSULTANTS:  Cristopher Peru, M.D. with Electrophysiology   CHIEF COMPLAINT/REASON FOR ADMISSION:  Ms. [name] is an 77 year old  female patient with prior cardiac history only of paroxysmal atrial  fibrillation and hypertension who presented to the ER after awakening at  3:45 in the morning with severe substernal chest pain radiating across the  precordium into her back and down her left arm. This was associated with  nausea, vomiting and diaphoresis. She called 911 and was brought to the  emergency room. An EKG: Acute ST segment elevation in leads V2 and V4  consistent with anterior MI. She was evaluated by Dr. Martinique. In the ER her  BP was 132/60, pulse 91, respirations 24. Physical exam was unremarkable  except for a small 1/6 light systolic murmur in the apex. EKG: Again showed  ST elevation as previously described. Initial troponin was 1.01, CK-MB 9.6,  creatinine 0.4. The patient was taken emergently to the cath lab by Dr.  Martinique due to acute anterior MI and will subsequently be admitted to a  monitored bed post cath.   ADMITTING DIAGNOSES:  1.  Acute anterior myocardial infarction.  2.  Hypertension.  3.  Hypernatremia.  4.  History of atrial fibrillation currently maintaining sinus rhythm.  5.  Arthritis.  6.  Hyperglycemia.   HOSPITAL COURSE:  Acute anterior myocardial infarction. The patient was  taken to the cath lab on the date of admission where she was found to have  normal coronary arteries, diminished EF of 35-40%,  akinesis at the mid  distal LV and apex and a left ventricular outflow track gradient. All of  this consistent with Takats syndrome. Her troponin peaked at 3.95, MB 42.7,  relative index of 17.9. Post cath the patient remained chest pain-free.  Because of her decreased EF she was started on Lopressor and Lisinopril.  Because of the apical akinesis and her risk for apical thrombus she was  started on Coumadin anticoagulation. She was continued on IV Heparin.   By April 25, 2005 the patient was improving. Her blood pressure was 90/40,  heart rate 85. Dr. [name] opted to increase her beta blocker, Hep-Lock her  IV, start her on Coumadin and transfer her to telemetry unit. By April 26, 2005 she was without any specific complaints. BP 126/58. INR 0.9. She had  significant input greater than 4 L with less than 800 out. He felt she was  in mild CHF secondary to her LV dysfunction. He gave her Lasix 40 mg b.i.d.  starting that day via IV and recommended checking a BMET and BMP in the  morning.   That evening Dr. [name], M.D. was on  call who was called to see the patient  because she had syncope and was found to be in ventricular  tachycardia/ventricular fibrillation. She was subsequently transferred to  the CCU. She was started on IV amiodarone, continued on Coreg, ACE inhibitor  and continued on Lasix for diuresis. On exam she did have bibasilar  crackles. She was also continued on IV Heparin. It is important to note that  the patient had spontaneous resolution and reversion back to sinus rhythm.  She did not require CPR or cardioversion.   On April 27, 2005 Dr. [name] reviewed the rhythm strips and examined the  patient. She felt that with patient's recent coronary syndrome and Takats  cardiomyopathy that ventricular tachycardia was not out of the question. She  changed IV amiodarone to 400 mg p.o. b.i.d. Dr. Cristopher Peru with  electrophysiology was consulted.   Again Dr. Lovena Le agreed with  Dr. Don Broach assessment of arrhythmias  secondary to Takats syndrome. He felt that her wall motion abnormalities and  decreased EF would be transient. He recommended continuing the amiodarone  for ventricular tachycardia and following her electrolytes closely. The  patient had no further episodes of ventricular tachycardia after this.   The patient was continued on Coumadin with plans to discharge home once INR  was therapeutic. In addition discharge was delayed due to need to wait for  LifeVest to be available for patient's underlying ventricular tachycardia  history and for teaching and appropriate application and use of the LifeVest  per electrophysiology PA Marcellus Scott.   In regards to the patient's CHF exacerbation with treatment of diuretics and  appropriate titration of ACE inhibitor and Coreg the patient's heart failure  became compensated. When she was admitted she was noted to have  hyponatremia. Her sodium remained in the high 120s throughout most of the  hospitalization. Dr. [name] has opted to allow primary care physician to  evaluate this in the outpatient setting.   By May 01, 2005 the patient's INR was therapeutic at 2.1. IV Heparin was  discontinued. Unfortunately the LifeVest was not available. The patient's  stay  during the hospital was otherwise unremarkable.   By May 04, 2005 the LifeVest was available. The patient did receive  appropriate instruction in the use of this device and was deemed appropriate  for discharge home.   FINAL DISCHARGE DIAGNOSES:  1.  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  2.  Decreased left ventricular systolic function, ejection fraction 35-40%.  3.  Apical ballooning syndrome with apical akinesis, increased risk for      apical thrombus.  4.  Coumadin anticoagulation for risk for apical thrombus.  5.  Ventricular tachycardia secondary to Takats cardiomyopathy without      recurrence. 6.  Hypertension  controlled.  7.  Hyponatremia etiology unknown.  8.  Hyperglycemia with hemoglobin A1c 7.0.  9.  History of paroxysmal atrial fibrillation without additional recurrences      here.   DIAGNOSTICS THIS HOSPITALIZATION:  The patient did undergo repeat  echocardiogram on July 17 on the date of discharge. This showed that the  left ventricle was mildly dilated. EF 30-40%. Findings were suggestive of a  small laminated, nonmobile thrombus along the apical wall of the left  ventricle. She continued to have apical ballooning basilar wall  hyperdynamic.   DISCHARGE MEDICATIONS:  1.  Stop Inderal and propranolol.  2.  Lisinopril/HCTZ 10/12.5 daily. Please stop this.  3.  Prilosec over-the-counter as needed for indigestion.  4.  Lisinopril  10 mg daily.  5.  Aspirin 81 mg daily.  6.  Coreg 6.25 mg b.i.d.  7.  Colace 100 mg b.i.d.  8.  Coumadin 5 mg tablet, take 1/2 tablet daily at suppertime.  9.  Lumigan eye drops at bedtime.  10. Zyrtec 10 mg daily.  11. Ambien 5 mg as needed at bedtime.   DISCHARGE INSTRUCTIONS:  Diet: A 1200 cubic centimeters and 24-hour fluid  restriction due to hyponatremia, also heart healthy diet. Activity: Increase  activity slowly. No driving until followed up with Dr. Lovena Le.   FOLLOW UP:  Appointment with Dr. [name] Monday, July 31 at 11:30 a.m.  Coumadin Clinic at [facility] Friday, July 21 at 3 p.m.   SPECIAL INSTRUCTIONS:  Use the LifeVest as instructed. Call Dr. Lovena Le for  an appointment in 2 weeks. Call Dr. Rockwell Alexandria for followup after discharge  regarding low sodium level and for prescription for Actonel.      [place] Lissa Merlin, N.P.    ______________________________  Peter M. Martinique, M.D.    ALE/MEDQ  D:  08/18/2005  T:  08/19/2005  Job:  [id number]   cc:   Champ Mungo. Lovena Le, M.D.  [address]  [address] [place] [address]   Peter M. Rockwell Alexandria, M.D.  Fax: [phone]

## 2011-03-06 NOTE — H&P (Signed)
[name], [name]                ACCOUNT NO.:  [id number]   MEDICAL RECORD NO.:  [id number]                   PATIENT TYPE:  INP   LOCATION:  5159                                 FACILITY:  [facility]   PHYSICIAN:  Deborah Chalk, M.D.            DATE OF BIRTH:  1923-05-07   DATE OF ADMISSION:  07/11/2003  DATE OF DISCHARGE:                                HISTORY & PHYSICAL   CHIEF COMPLAINT:  Severe right flank pain.   HISTORY OF PRESENT ILLNESS:  Ms. Dundee is a very pleasant 77 year old  white female patient of Dr. Knute Neu, who presents with a five-day  history of severe back pain.  The pain initially began in the flank area on  both sides.  It has  localized in the last 24 hours to the right flank only.  She states it is sharp, spasmatic.  It occurs with movement only.  If she  sits still, she does not feel the pain.  She states the pain began the day  after an aggressive water aerobics class.  She felt she had pulled a muscle.  The pain worsened, and two days later she presented to an orthopedist, Dr.  Roxy Manns, who obtained films that were negative for fracture.  Ms. Oncale  tells [place] that 10 days ago, she fell on her right side after getting out of  the shower.  She did not think much of it and had no pain until five days  ago.  She describes the pain as sharp, a 10 on a scale of 1 to 10.  It does  not radiate.  She has had no cough, no hematuria, no shortness of breath.  She called the on-call physician in the middle of the night due to severe  pain.  He told her to take some Motrin and Vicodin and present to the office  this morning.  She was able to get rest last night if she would lie still.  She tells me that when she got up, she felt pain again and in fact is much  worse this morning.   In the office, she was found to complain of extreme discomfort when getting  up and down.  A urinalysis is negative for hematuria.  Due to severe pain  and  the uncertainty of the origin, she is being admitted for the same.   REVIEW OF SYSTEMS:  She denies any fever, cough, shortness of breath, or  dyspnea.  She also denies any radiation of pain.  No abdominal pain.  GU:  Denies any hematuria, urinary frequency, or dysuria.  INTEGUMENT: She has  seen no rash.   PAST MEDICAL HISTORY:  1. Ovarian cyst on left side.  2. Osteoporosis.  3. Polymyalgia rheumatica.  4. Migraine headaches.  5. Hyperlipidemia.   PAST SURGICAL HISTORY:  1. Right rotator cuff surgical repair.  2. Sinus surgery.   MEDICATIONS:  1. Prilosec 20 mg a day.  2. Inderal 20 mg  four times a day.  3. Fosamax 70 mg a week.  4. Calcium 600 mg with vitamin D b.i.d.  5. Celebrex 200 mg a day.   SOCIAL HISTORY:  Married, four children.  She works part time in a day care  two days a week.  She does not smoke.  Drinks alcohol socially.   FAMILY HISTORY:  There is a family history of diabetes, colon cancer in a  cousin, asthma, and hay fever.   PHYSICAL EXAMINATION:  VITAL SIGNS:  Weight 141, temperature 97.4, pulse 64,  blood pressure 130/78.  GENERAL:  Alert, pleasant.  She complains of severe discomfort in the right  flank when getting up and down, especially on exam table.  HEENT:  Eyes: PERRLA.  Ear, nose, and throat unremarkable.  NECK:  No JVD.  No carotid bruits.  HEART:  Regular S1 and S2 without murmur or rub.  LUNGS:  Clear throughout.  ABDOMEN:  Bowel sounds present in all four quadrants.  She is nontender.  Flank is nontender on both sides.  BACK:  No tenderness along the spine.  There is no rash.  Exam is  unremarkable.  EXTREMITIES:  No swelling.  She moves all without difficulty.  NEUROLOGIC:  Nonfocal.   LABORATORY DATA:  Normal urinalysis.   IMPRESSION:  Severe right flank pain.   PLAN:  Admit due to severe nature of pain.  Obtain CT of the abdomen, LS  films, possible bone scan if CT of the abdomen is unremarkable.  Follow up  per Dr. Arnoldo Morale or  associate.      Doristine Bosworth, N.P.                         Deborah Chalk, M.D.    TL/MEDQ  D:  07/11/2003  T:  07/12/2003  Job:  [id number]

## 2011-03-06 NOTE — Consult Note (Signed)
Cheryl, Everett      ACCOUNT NO.:  [id number]   MEDICAL RECORD NO.:  [id number]          PATIENT TYPE:  INP   LOCATION:  2922                         FACILITY:  [facility]   PHYSICIAN:  Champ Mungo. Lovena Le, M.D.  DATE OF BIRTH:  02-16-1923   DATE OF CONSULTATION:  04/28/2005  DATE OF DISCHARGE:                                   CONSULTATION   INDICATION FOR CONSULTATION:  Evaluation of symptomatic hemodynamically  unstable ventricular tachycardia in the setting of nonischemic  cardiomyopathy.   HISTORY OF PRESENT ILLNESS:  The patient is an 77 year old woman who was  admitted to the hospital with chest pain and was thought to have an acute  myocardial infarction based on EKG and initial enzymes. She underwent  emergent catheterization which demonstrated no obstructive coronary artery  disease. Her left ventriculogram demonstrated the evidence of anterior  apical and diaphragmatic ballooning (octopus heart) all consistent with  Takosubo's cardiomyopathy. The patient has been treated with congestive  heart failure medications. She was stable until several  days ago when she  developed symptomatic prolonged episode of ventricular tachycardia which was  quite rapid and associated with syncope. This is noted for approximately 30  seconds of tachycardia. The cycling of the tachycardia was approximately 200  milliseconds. It stopped spontaneously as the patient was being assisted and  initially being given CPR. She did not receive defibrillation shock. We are  now referred for additional evaluation. The patient denies any history of  syncope. She has a history of paroxysmal atrial fibrillation, has been on  Coumadin in the past. She has a history of hypertension, history of heart  murmur, and a history of rotator cuff surgery.   SOCIAL HISTORY:  The patient is married. She has four children. She is  retired. She is a Education officer, museum in the past. She denies tobacco or ethanol  use.   FAMILY HISTORY:  Notable for premature coronary artery disease, but  otherwise unremarkable with the patient's advanced age.   REVIEW OF SYSTEMS:  Negative for vision or hearing problems. Negative for  recent weight changes. She denies vertigo. She does have occasional  difficulty with her vision secondary to her photophobia. She denies any skin  rashes or lesions. She denies any claudication, cough, or hemoptysis. She  had one syncopal episode as previously noted. She was admitted with chest  pain and shortness of breath as previously described. She denies dysuria,  hematuria, or urinary frequency. She does have nocturia times two. She  denies weakness, numbness, or compression. She denies nausea, vomiting,  diarrhea, or abdominal pain. She does have occasional reflux symptoms. She  denies polyuria, polydipsia, heat or cold intolerance, skin or hair changes.  She denies arthralgias.   PHYSICAL EXAMINATION:  GENERAL: She is a pleasant elderly appearing woman in  no acute distress.  VITAL SIGNS: Blood pressure is 95/50, pulse 72 and regular, respirations 18,  temperature 98.  HEENT: Normocephalic and atraumatic. Pupils are equal and round. The  oropharynx is moist. Sclerae anicteric.  NECK: No jugular venous distention. No thyromegaly. Trachea midline. The  carotids are 2+ and symmetric.  LUNGS: Clear bilaterally to auscultation, except  for rare basilar rales. No  rhonchi or wheezes. There is no increased work of breathing.  CARDIOVASCULAR: Regular rate and rhythm with normal S1 and S2. There is  grade 2/6 systolic murmur at the left lower sternal border consistent with  mitral regurgitation.  ABDOMEN: Soft, nontender, and nondistended. No organomegaly.  EXTREMITIES: No clubbing, cyanosis, or edema. Pulses are 2+ and symmetric.  NEUROLOGIC: Alert and oriented times three with cranial nerves II-XII  intact. Strength is 5/5 and symmetric.  MUSCULOSKELETAL:  Grossly normal.    IMPRESSION:  1.  Transient left ventricular apical balloon syndrome (Takosubo).  2.  Ventricular tachycardia/ventricular flutter (prolonged), nonsustained      with spontaneous termination.  3.  History of paroxysmal atrial fibrillation.  4.  Coumadin therapy.   DISCUSSION:  The patient's risk for sudden death in the short term is not  trivial. However, the natural of her problem is that her LV function should  improve as should her risk for sudden death. For this reason I would  recommend that she be continued on maximum medical therapy for her  congestive heart failure symptoms, that she have her amiodarone  discontinued, and that she undergo placement of a life vest to protect her  from recurrent ventricular arrhythmias such as the ones that she sustained  several days ago.  Assuming that her LV function normalizes, then no ICD  implantation will be required. If her LV function remains persistently down,  which would be against the natural history of her present illness, then ICD  implantation would be a consideration.       GWT/MEDQ  D:  04/28/2005  T:  04/28/2005  Job:  [id number]   cc:   Fabio Asa, M.D.  [address] [place] [address]  Fax: [phone]   Ander Slade. Rockwell Alexandria, M.D.  [place]  [address] [place] [address]  Fax: [phone]

## 2011-03-06 NOTE — Consult Note (Signed)
[name], STOLTMAN                ACCOUNT NO.:  [id number]   MEDICAL RECORD NO.:  [id number]                   PATIENT TYPE:  INP   LOCATION:  5158                                 FACILITY:  [facility]   PHYSICIAN:  Merri Ray. Grandville Silos, M.D.             DATE OF BIRTH:  11-19-1922   DATE OF CONSULTATION:  DATE OF DISCHARGE:                                   CONSULTATION   REASON FOR CONSULTATION:  Central line placement.   HISTORY OF PRESENT ILLNESS:  The patient is a 77 year old white female who  was admitted by Dr. Arnoldo Morale with ischemic colitis.  She has undergone  colonoscopic decompression and recommendations from GI are that she be kept  NPO and started on TNA and I was consulted in order to insert the central  lines so she can receive her parenteral nutrition.   PAST MEDICAL HISTORY:  1. Osteoporosis  2. Polymyalgia rheumatica.  3. Hyperlipidemia.  4. Migraine headaches.  5. Diverticulitis.   PAST SURGICAL HISTORY:  1. Right rotator cuff repair.  2. Sinus surgery.  3. 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   SOCIAL HISTORY:  She does not smoke.  She occasionally drinks alcohol.   FAMILY HISTORY:  Some asthma and diabetes.   MEDICATIONS:  1. Protonix.  2. Inderal.  3. MiraLax.  4. Demerol.  5. Ambien.  6. Phenergan.   ALLERGIES:  1. SKELAXIN.  2. CEPHALOSPORINS.  3. SULFA.  4. ERYTHROMYCIN.  5. MORPHINE.   REVIEW OF SYSTEMS:  GENERAL:  Negative.  CARDIOVASCULAR:  Negative.  PULMONARY:  Negative.  GI:  She is moving her bowels but still having some  abdominal pain from her ischemic colitis.  Says this has gotten better since  she has been in the hospital.  MUSCULOSKELETAL:  She has some arthritis  pain. The remainder of the Review of Systems is noncontributory.   PHYSICAL EXAMINATION:  VITAL SIGNS:  Temperature is 98.2, blood pressure  156/67.  GENERAL:  She is awake, alert, in no  acute distress.  HEENT:  Pupils are equal and reactive.  Sclerae is nonicteric.  NECK:  Supple with no masses.  LUNGS:  Clear to auscultation bilaterally.  HEART:  Regular rate and rhythm.  PMI is palpable on the left chest.  ABDOMEN:  Moderately distended.  She has normal bowel sounds mixed with some  high-pitched bowel sounds.  She has some tenderness on exam, especially  along the right mid abdomen with no guarding.  SKIN:  Warm and dry with no rashes.   DATA REVIEWED:  White blood cell count today is 8.0, hemoglobin 10.4.   IMPRESSION AND PLAN:  Impression:  Pleasant 77 year old white female with  ischemic colitis in need for central access for TNA.  I explained with her  the risks and benefits of placing central line, including bleeding and  pneumothorax but not  limited to these.  She is agreeable with the  proceeding.  The patient is, of note, is known to Dr. Kaylyn Lim from our  practice for previous diverticulitis admissions and I will discuss the case  with him so he is aware of her being in the hospital in the event that she  goes on to need surgical intervention for her ischemic colitis.   Plan:  In the interim we will go ahead and place her central line today so  she may receive her TNA.                                               Merri Ray Grandville Silos, M.D.    BET/MEDQ  D:  07/16/2003  T:  07/16/2003  Job:  [id number]

## 2011-03-06 NOTE — Discharge Summary (Signed)
NAMEJOMANA, [name]      ACCOUNT NO.:  [id number]   MEDICAL RECORD NO.:  [id number]          PATIENT TYPE:  INP   LOCATION:  5025                         FACILITY:  [facility]   PHYSICIAN:  Nehemiah Settle, M.D.    DATE OF BIRTH:  Aug 20, 1923   DATE OF ADMISSION:  04/07/2006  DATE OF DISCHARGE:  04/09/2006                                 DISCHARGE SUMMARY   ADMITTING DIAGNOSIS:  Weakness and dehydration.   DISCHARGE DIAGNOSES:  1.  Weakness and dehydration secondary to recent gastrointestinal illness.  2.  Wheezing, mild reactive airways disease.  3.  Hyponatremia, improved.  4.  Low back pain, no evidence of recurrent fracture.  5.  Recent gastroenteritis, resolving.  6.  History of polymyalgia rheumatica.  7.  History of constipation.  8.  History of apical ballooning syndrome.  9.  Osteoporosis, status post numerous vertebroplasties.  10. History of urinary retention with recurrent problems during this      admission.   The patient is an 77 year old white female who was admitted with wheezing  and weakness.  She had had a recent GI illness, developed some shortness of  breath and wheezing, and was brought to the emergency department.   HOSPITAL COURSE:  The patient was found to be hyponatremic with a sodium of  121 at admission.  Last week in our office, her sodium at been 130.  She was  also wheezing on exam.  She was treated with Xopenex as well as intravenous  fluids.  She rapidly had improvement in her sodium level with a sodium of  130 on the second hospital day.  Wheezing improved as well.  She complained  of worsening low-back pain and x-rays were done which showed the previous  kyphoplasties but no evidence of another fracture.  She does note that the  low back pain moves around, and I believe this is most consistent with a  mechanical-like back pain and not due to another fracture.  She did not have  a bowel movement during the hospitalization, but she had had  severe diarrhea  for several days prior to admission and she just ate a small amount, so we  will observe that as she is discharged.   She was able to work with physical therapy and is able to ambulate 60 feet  with a rolling walker and is discharged to continue physical therapy at  home.   DISCHARGE MEDICATIONS:  1.  Forteo subcutaneously once daily.  2.  Aspirin 81 mg daily.  3.  Coreg 12.5 mg twice daily.  4.  Simethicone 80 mg three times a day.  5.  Estrace vaginal cream 0.01% three times a week.  6.  MiraLax one to two scoops daily.  7.  Milk of Magnesia p.r.n.  8.  Prilosec OTC 20 mg daily.  9.  Calcium 600 mg Plus D twice a day.  10. Prednisone 2.5 mg every other day.  11. Demeclocycline 300 mg daily.  12. Hydrocodone 10 mg with acetaminophen five times a day as needed.  13. Zyrtec 10 mg daily.  14. Dicyclomine 10 mg three times a day.  15. Nasacort AQ  one puff daily.  16. Lorazepam 0.5 mg 1/2 tablet t.i.d. p.r.n.  17. Xopenex 0.63 mg three times a day for a week then as needed.  18. Antifungal powder to the groin two times a day until area healed.   ACTIVITY:  She will be seen by physical therapy from [facility] and  will continue her improvement.  She will need skilled nursing visits as well  to be sure things are making progress.   FOLLOW UP:  She will call to make an appointment to see Dr. Rockwell Alexandria in  approximately two weeks.      Nehemiah Settle, M.D.  Electronically Signed     JO/MEDQ  D:  04/09/2006  T:  04/09/2006  Job:  [id number]

## 2011-03-06 NOTE — H&P (Signed)
[name], [name]      ACCOUNT NO.:  [id number]   MEDICAL RECORD NO.:  [id number]          PATIENT TYPE:  INP   LOCATION:  2004                         FACILITY:  [facility]   PHYSICIAN:  Nehemiah Settle, M.D.    DATE OF BIRTH:  [birth year]   DATE OF ADMISSION:  05/14/2006  DATE OF DISCHARGE:                                HISTORY & PHYSICAL   HISTORY OF PRESENT ILLNESS:  This is an 77 year old, white female with chief  complaint of nausea, vomiting and confusion.  She was seen on July 20, and  put on Cipro for a urinary tract infection.  It upset her stomach and caused  poor appetite.  The family continued the medicine and the patient felt  stronger by this Wednesday, but worse by Wednesday night with vomiting,  restlessness and increased confusion.  When she was seen today and found to  have a sodium of 116, we decided to admit.   She was admitted for similar symptoms June 20 to June 22, and July 5 to July  10.  She was diagnosed earlier this year with SIADH and treated with  demeclocycline 300 mg.  Initially, the symptoms started after Forteo.   PAST MEDICAL HISTORY:  1. Syncope with sustained ventricular tachycardia.  2. Polymyalgia rheumatica.  3. SIADH with hyponatremia.  4. Asthma.  5. Chronic back pain with multiple compressions of L1, L3 and T12 with      vertebroplasties of these sites in May 2007.  6. Hypertension.  7. Elevated cholesterol.  8. Cardiomyopathy (apical ballooning syndrome July 2007) with marked      improvement subsequently.  9. 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  10.Depression.  11.History of urinary retention.  12.History of atrial fibrillation.  13.Diverticulitis with abscess.  14.Lumbar spinal stenosis.  15.Ischemic colitis in 2004.  16.Hyperglycemia.   ALLERGIES:  AVELOX, MORPHINE, DURAGESIC, KETEK, CEPHALOSPORIN, SKELAXIN,  CEFOTAXIME, SULFA, BIAXIN AND SUDAFED.   MEDICATIONS:  1. Prilosec 20 mg one daily.  2. Zyrtec  10 mg one daily.  3. Coreg 12.5 mg one b.i.d.  4. Aspirin 81 mg one daily.  5. Prednisone 2.5 mg one daily.  6. Demeclocycline 150 mg one a day.  7. Miacalcin nasal spray one spray alternating nostrils.  8. Multivitamin with mineral one a day.  9. Nasacort AQ two puffs in nostrils once a day.  10.Calcium carbonate 600 mg one t.i.d.  11.Protein powder 26 g one or two doses a day.  12.Xopenex 0.63 mg t.i.d. by nebulizer.   PAST SURGICAL HISTORY:  1. Rotator cuff surgery.  2. Precancerous lesion of left cheek.  3. Left Caldwell-Luc x2.  4. Intranasal ethmoidectomy.  5. Vertebroplasties x3 in May 2007.   SOCIAL HISTORY:  No alcohol, no smoking.  Lives with husband.  She has an  attentive family.   FAMILY HISTORY:  Positive for diabetes.  Colon cancer in an uncle and a  cousin.   REVIEW OF SYSTEMS:  Per chief complaint.   PHYSICAL EXAMINATION:  GENERAL:  Elderly female who is acutely ill.  VITAL SIGNS:  Blood pressure 140/80, pulse 60, respirations 16.  HEENT:  Oropharynx dry.  NECK:  Supple without JVD or thyromegaly.  CHEST:  Clear to auscultation.  CARDIAC:  Regular rhythm.  ABDOMEN:  Soft and nontender.  EXTREMITIES:  No edema.  NEUROLOGIC:  Nonfocal.  SKIN:  Clear.   LABORATORY DATA AND X-RAY FINDINGS:  Sodium 116, potassium 3.1.   ASSESSMENT:  Hyponatremia.   PLAN:  To admit.  Plan accordingly.      Marliss Coots, N.P.      Nehemiah Settle, M.D.  Electronically Signed    MAP/MEDQ  D:  05/14/2006  T:  05/14/2006  Job:  [id number]

## 2011-03-06 NOTE — H&P (Signed)
[name], [name]                ACCOUNT NO.:  [id number]   MEDICAL RECORD NO.:  [id number]                   PATIENT TYPE:  INP   LOCATION:  6524                                 FACILITY:  [facility]   PHYSICIAN:  Delanna Ahmadi, M.D.               DATE OF BIRTH:  1923/09/13   DATE OF ADMISSION:  11/26/2003  DATE OF DISCHARGE:                                HISTORY & PHYSICAL   CHIEF COMPLAINT:  Weak and dizzy.   HISTORY OF PRESENT ILLNESS:  This is a 77 year old white female who tonight  after dinner got up and felt the sudden onset of lightheadedness, nausea and  diaphoresis.  She felt an ache across her upper back and tightness in the  center of her chest.  She felt like her heart was racing and irregular.  She  has had a recent attack of asthmatic bronchitis and was being treated with T-  Tac, which she had completed, albuterol q.i.d. since February 2 and  prednisone taper since February 2.  She denies any decongestant use.  She  has no history of cardiac problems.  She denies recent chest pain prior to  tonight.   PAST MEDICAL HISTORY:  1. Constipation status post ischemic colitis October 2004.  2. Vascular headaches.  3. Degenerative joint disease.  4. Osteoporosis.  5. PMR.  6. Elevated lipids.  7. Diverticulosis with severe diverticulitis in the 1990's and a left colon     diverticular stricture.  8. Left ovarian cyst.   PAST SURGICAL HISTORY:  1. Right rotator cuff surgery.  2. Sinus surgery.  3. Traumatic amputation right fifth finger.   ALLERGIES:  MORPHINE.   CURRENT MEDICATIONS:  1. Inderal, probably 10 mg q.i.d.  2. Fosamax 70 mg q. week.  3. Calcium 600 mg b.i.d.  4. Ocuvite.  5. Aspirin 325 mg q. day.  6. Prednisone taper.  7. Albuterol p.r.n.   FAMILY HISTORY:  Diabetes, colon cancer in cousin, asthma and allergic  rhinitis.   SOCIAL HISTORY:  Married with four children.  She works part time in daycare  twice a week.  She does not  smoke.  Social alcohol.   REVIEW OF SYMPTOMS:  She denies recent chest pain, shortness of breath,  weight loss, palpitations, sweats, fatigue.   PHYSICAL EXAMINATION:  GENERAL:  She appears comfortable.  BLOOD PRESSURE:  122/71  HEART RATE:  149  RESPIRATIONS:  20  TEMPERATURE:  97.1  OXYGEN SATURATION:  96% on room air.  HEENT:  Pupils equal, round and reactive to light.  Extraocular movements  are intact.  Ears:  TMs are clear.  Oral canal and pharynx are clear.  NECK:  Supple.  No lymphadenopathy, no thyromegaly and no carotid bruits.  LUNGS:  Clear.  HEART:  Irregularly regular without murmur, gallop or rub.  ABDOMEN:  Soft, nontender, no masses or hepatosplenomegaly.  EXTREMITIES:  No edema.  NEUROLOGIC:  Nonfocal.   LABORATORIES:  BMET; sodium 132, potassium  4.3, chloride 101, BUN 30,  creatinine 1.3, glucose 183, CK MB 1.1, magnesium 2.1, calcium 8.7,  troponin, 0.05.  Chest x-ray; cardiomegaly with no acute disease.  EKG;  atrial fibrillation, rate of 146, inferior lateral ST-T wave changes.   ASSESSMENT:  1. New onset atrial fibrillation with rapid ventricular response.  2. Asthmatic bronchitis, resolving with prednisone.  3. Mild dehydration.  4. Other chronic medical problems listed above are stable.   PLAN:  Telemetry, diltiazem IV, Lovenox, aspirin, cardiac enzymes,  cardiology consult, 2D echo, TSH, DC albuterol, continue prednisone taper,  and gentle IV fluids.                                                Delanna Ahmadi, M.D.    Elijah Birk  D:  11/27/2003  T:  11/27/2003  Job:  [id number]   cc:   Ander Slade. Rockwell Alexandria, M.D.  [address]  [place] [address]  Fax: [phone]

## 2011-03-06 NOTE — Consult Note (Signed)
[name], [name]                ACCOUNT NO.:  [id number]   MEDICAL RECORD NO.:  [id number]                   PATIENT TYPE:  INP   LOCATION:  6524                                 FACILITY:  [facility]   PHYSICIAN:  Fabio Asa, M.D.                 DATE OF BIRTH:  28-Jan-1923   DATE OF CONSULTATION:  DATE OF DISCHARGE:                                   CONSULTATION   REFERRING PHYSICIAN:  Delanna Ahmadi, M.D.   INDICATION FOR CONSULT:  New onset atrial fibrillation with positive cardiac  enzymes.   HISTORY OF PRESENT ILLNESS:  [name] is a very pleasant 77 year old female  who was in her usual state of health.  On the night prior to admission, she  noted that she had eaten some lamb stew and then felt sort of full,  lightheaded, and has associated nausea and diaphoresis.  This was followed  by an aching across her upper chest and back.  She felt as if her heart was  racing.  She presented to the emergency room for further evaluation.  The  patient has had a recent upper respiratory infection.  In the emergency  room, she was found to have atrial fibrillation with a rapid ventricular  response.  She was subsequently started on IV Cardizem with good rate  control.  She has remained rate controlled on IV Cardizem.  She has had no  further chest pain since her admission.   PAST MEDICAL HISTORY:  Significant for:  1. Recent obstipation secondary to colitis in October of 2004.  2. Vascular headaches.  3. Degenerative joint disease.  4. Osteoporosis.  5. Borderline dyslipidemia.  6. Diverticula in the 1990s.  7. Left ovarian cyst.  8. Hypertension.  9. Polymyalgia rheumatica and she is on a prednisone taper.   PAST SURGICAL HISTORY:  Significant for:  1. Right rotator cuff.  2. Sinus surgery.   FAMILY HISTORY:  Significant for diabetes, asthma, hay fever, and colon  cancer.   SOCIAL HISTORY:  She is active.  She is married.  She has four children.  Her husband is  a patient of mine as well.  She drinks alcohol on a social  basis only.  She has previously been involved in water aerobics three to  four times per week.  She has noted no change in her water aerobics,  however, she has not participated in the past week secondary to her upper  respiratory infection.   ALLERGIES:  1. CEPHALOSPORINS.  2. SULFA.  3. CLARITHROMYCIN.  4. MORPHINE.  5. METAXALONE.   CURRENT MEDICATIONS:  1. Inderal 10 mg q.i.d. p.r.n. for headaches.  2. Fosamax 70 mg weekly.  3. Calcium 600 mg b.i.d.  4. Ocuvite.  5. Aspirin 325 mg daily.  6. Prednisone taper.  7. Albuterol p.r.n.  8. Omeprazole 20 mg daily.  9. Lisinopril 10 mg daily started on April 25, 2003.   HOSPITAL MEDICATIONS:  1. Lovenox 1 mg/kg  q.12h.  2. Protonix 40 mg daily.  3. Cardizem drip.  4. Inderal 10 mg q.i.d.  5. Aspirin 325 mg daily.  6. Prednisone 30 mg taper.   PHYSICAL EXAMINATION:  GENERAL APPEARANCE:  An elderly female in no acute  distress.  Conversation is appropriate and pleasant.  VITAL SIGNS:  She is afebrile.  The blood pressure was 125/70 and heart rate  of 68.  Telemetry is revealing atrial fibrillation with a controlled  ventricular response.  HEENT:  Unremarkable.  NECK:  She has good carotid upstrokes.  There is no neck vein distention  noted.  PULMONARY:  Breath sounds are clear to auscultation.  No wheezing is noted.  CARDIOVASCULAR:  An irregular irregular rhythm.  There is a soft systolic  murmur noted at the left sternal border.  The PMI is not displaced.  ABDOMEN:  Soft and nontender.  No bruits noted.  EXTREMITIES:  No peripheral edema.  No clubbing.  No cyanosis.  Distal  pulses are palpable.  NEUROLOGIC:  Nonfocal.  SKIN:  Warm and dry.   LABORATORY DATA:  Serial cardiac enzymes reveal her troponin I to be  elevated at 0.32.  She has a normal CK and normal CK-MB.  Blood counts  marginally elevated at 13.6.  The hematocrit is 37 and platelet count  420,000.   Potassium 4.4.  The creatinine is 1.3.  Liver function tests are  normal.  The INR is 0.9.  The TSH is pending.  The initially EKG revealed  atrial fibrillation with a rapid ventricular response.  There was 1 mm ST  depression in the inferolateral leads noted.  Her 2-D echocardiogram is  currently pending.   IMPRESSION:  50. A 77 year old female previously in relatively good health, who presents     with what is presumed to be new onset atrial fibrillation.  She has not     had an EKG in quite some time.  She is noted to have borderline elevation     in troponins, as well as chest pain.  Left heart catheterization and     coronary angiography was discussed with the patient.  After considering     her options, she wishes to proceed with coronary angiography.  2. Atrial fibrillation.  Will continue with Lovenox.  The patient will then     be started on Coumadin.  She will need to be anticoagulated for four     weeks prior to initiation of antiarrhythmic therapy.  The upper     respiratory infection may have precipitated the atrial fibrillation.  The     patient may spontaneously convert back to her sinus rhythm.     In the interim, we will use beta blockers for rate control.  3. Dyslipidemia.  Will need to check a fasting lipid profile.  4. Leukocytosis.  May be secondary to dehydration and prednisone taper.                                               Fabio Asa, M.D.    HP/MEDQ  D:  11/27/2003  T:  11/27/2003  Job:  [id number]

## 2011-03-06 NOTE — Discharge Summary (Signed)
NAMESTEPHANNY, [name]      ACCOUNT NO.:  [id number]   MEDICAL RECORD NO.:  [id number]          PATIENT TYPE:  INP   LOCATION:  3741                         FACILITY:  [facility]   PHYSICIAN:  Ashby Dawes. Polite, M.D. DATE OF BIRTH:  [birth year]   DATE OF ADMISSION:  04/27/2007  DATE OF DISCHARGE:  05/02/2007                               DISCHARGE SUMMARY   DISCHARGE DIAGNOSES:  1. Blood-loss anemia secondary to gastrointestinal bleed.      Esophagogastroduodenoscopy negative for source. Colonoscopy shows      aphthous ulceration in the ascending colon.  No signs of ischemia.  2. Weakness and dizziness.  3. History of syncope.  4. PMR.  5. Syndrome of inappropriate antidiuretic hormone secretion.  6. Asthma.  7. Chronic back pain with multiple Compression fractures.  8. Hypertension.  9. Elevated cholesterol.  10.Cardiomyopathy.  11.Depression.   DISCHARGE MEDICATIONS:  Per admission H&P which includes:  1. MiraLax.  2. Protonix.  3. HCTZ.  4. Demadex.  5. Zyrtec.  6. Coreg.  7. Nasonex.  8. Calcium carbonate.  9. Aspirin 81 mg.  10.Forteo 20 mg.  11.Ativan 0.5 mg p.r.n.  12.Ibuprofen p.r.n.   DISPOSITION:  Discharged to home.   HISTORY OF PRESENT ILLNESS:  Elderly female admitted to the hospital  with above complaint of weakness and dizziness.  In the ED was  evaluated.  Admission was deemed necessary for GI bleed.  Please see  dictated H&P for further details.   PAST MEDICAL HISTORY:  Per admission H&P.   ALLERGIES:  PER ADMISSION H&P.   PAST SURGICAL HISTORY:  Per admission H&P.   MEDICATIONS:  As stated above.   HOSPITAL COURSE:  The patient was admitted  to a telemetry bed for  evaluation and treatment for GI bleed.  Hemoglobin on admission was 6.8.  The patient typed and crossed for 2 units of packed red blood cells.  Followup hemoglobin 9.7.  GI was consulted.  The patient underwent EGD  without source of bleeding.  Serial H&H did show a drop in  hemoglobin.  Partially considered to be delusional effect from IV fluids.  The  patient was given an additional unit of packed red blood cells.  The  patient stabilized over the weekend, ultimately had a colonoscopy which  revealed aphthous ulceration in the ascending colon secondary to NSAIDs.  The patient was cleared for discharge from a medial and GI standpoint.  She was asked to follow up with primary MD.   DISCHARGE MEDICATIONS:  1. MiraLax 3 times a day.  2. Protonix 40 mg daily.  3. Wellbutrin 125 mg 3 times a day.  4. HCTZ 12.5 mg daily.  5. Demadex 300 mg daily.  6. Coreg 12.5 b.i.d.  7. Ativan 0.5 mg 3 times a day p.r.n.  8. Zyrtec 10 mg daily.  9. Forteo 20 mg daily.  10.Calcium with vitamin D.   The patient was instructed to take no aspirin, Aleve or ibuprofen.  No  Arthrotec.   The patient was discharged to home in stable condition.      Ashby Dawes. Polite, M.D.  Electronically Signed     RDP/MEDQ  D:  08/11/2007  T:  08/11/2007  Job:  [id number]

## 2011-03-06 NOTE — Cardiovascular Report (Signed)
NAMEDETRICE, BEHL                ACCOUNT NO.:  [id number]   MEDICAL RECORD NO.:  [id number]                   PATIENT TYPE:  INP   LOCATION:  6523                                 FACILITY:  [facility]   PHYSICIAN:  Fabio Asa, M.D.                 DATE OF BIRTH:  1923-03-05   DATE OF PROCEDURE:  11/28/2003  DATE OF DISCHARGE:  11/29/2003                              CARDIAC CATHETERIZATION   PROCEDURE:  Cardiac catheterization.   REFERRING PHYSICIAN:  Delanna Ahmadi, M.D.   CARDIOLOGIST:  Fabio Asa, M.D.   INDICATIONS:  New onset atrial fibrillation and positive troponins.   DESCRIPTION OF PROCEDURE:  After obtaining written informed consent, the  patient was brought to the cardiac catheterization lab in the post  absorptive tate.  Preoperative sedation was achieved with IV Versed.  The  right groin was prepped and draped in the usual sterile fashion.  Local  anesthesia was achieved using 1% Xylocaine.  A 6 French hemostasis sheath  was placed into the right femoral artery using a modified Seldinger  technique.  Selective coronary angiography was performed using a JL4, JR4  Judkins catheters.  Multiple views were obtained.  All catheter exchanges  were made over a guidewire.  Single plane ventriculogram was performed in  the RAO position using a 6 French pigtail curved catheter.  The patient was  then transferred to the holding area.  The hemostasis sheath was removed  using digital pressure.  There were no immediate complications.   FINDINGS:  The aortic pressure was 172/60.  LV pressure was 176/5.  The end-  diastolic pressure was 12.   Fluoroscopy revealed no significant calcium.   CORONARY ANGIOGRAPHY:  1. The left main coronary artery bifurcates into the left anterior     descending and circumflex vessel.  There is no disease noted in the left     main coronary artery.  2. The left anterior descending gives rise to a small to moderate D1, small     to  moderate D2 and goes on to end as an apical branch.  There is also a large septal perforator.  There is no disease in the left  anterior descending or its branches.  1. The circumflex vessel is a large caliber vessel that gives rise to a     trivial OM-I, a large bifurcating OM-II, large OM-III and then an trivial     AV groove vessel.  There is no disease noted in the circumflex or its     branches.  2. The right coronary artery gives rise to a large RV marginal to a PDA     branch and a PLA branch.  There is no disease noted in the right coronary     artery or its branches.   FINAL IMPRESSION:  1. False positive troponins.  Normal ventriculogram.  Ejection fraction of     65%.  No mitral regurgitation noted.  There was mid cavity  obliteration.     There was no gradient noted in the left ventricular outflow tract.  2. Normal coronary arteries.  3. Probable new onset atrial fibrillation exacerbated by bronchitis.   PLAN:  The patient will need to be anticoagulated with an INR of 2-3.  She  currently is back into sinus rhythm.  We will decrease her Inderal to 40 mg  b.i.d.  She will need a follow up in the [facility] in two days.  Follow up with me in two weeks.                                               Fabio Asa, M.D.    HP/MEDQ  D:  11/28/2003  T:  11/29/2003  Job:  [id number]

## 2011-03-06 NOTE — H&P (Signed)
[name], [name]      ACCOUNT NO.:  [id number]   MEDICAL RECORD NO.:  [id number]          PATIENT TYPE:  INP   LOCATION:  1823                         FACILITY:  [facility]   PHYSICIAN:  Peter M. Martinique, M.D.  DATE OF BIRTH:  [birth year]   DATE OF ADMISSION:  04/24/2005  DATE OF DISCHARGE:                                HISTORY & PHYSICAL   HISTORY OF PRESENT ILLNESS:  Ms. Sickinger is an 77 year old white female  who awoke this morning with acute chest pain beginning at 2:45 a.m.  She  described the pain as severe substernal pain radiating across her precordium  into her back and down the left arm.  Associated with nausea, vomiting, and  diaphoresis.  She denied shortness of breath.  She called 911 and was  brought to the emergency room.  ECG shows acute ST elevation in leads V2-V4,  consistent with anterior myocardial infarction.  The patient reports an  episode of atrial fibrillation a year and a half ago.  She underwent cardiac  evaluation at that time with an echocardiogram which showed a slight murmur.  She also had a cardiac catheterization which she reports was unremarkable.   PAST MEDICAL HISTORY:  1.  Hypertension.  2.  Vascular headaches.  3.  History of atrial fibrillation.  4.  Arthritis.  5.  Murmur.  6.  Status post rotator cuff surgery.  7.  Status post sinus surgery.  8.  Status post retinal surgery.   She has no known allergies.   MEDICATIONS:  1.  Prilosec 20 mg daily.  2.  Inderal 20 mg q.i.d.  3.  Lisinopril/HCT 10/12.5 mg daily.  4.  Bufferin daily.   SOCIAL HISTORY:  The patient is married.  She has four children.  She is  retired.  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  She denies smoking or  alcohol use.   FAMILY HISTORY:  Noncontributory.  Is negative for cardiac disease.   REVIEW OF SYSTEMS:  The patient had felt well up until this morning.  Otherwise, review of systems is negative.   PHYSICAL EXAMINATION:  GENERAL:  Elderly white  female in mild distress.  VITAL SIGNS:  Blood pressure 132/60; pulse 91; respirations 24; she is  afebrile.  HEENT:  Pupils equal, round, and reactive.  Extraocular movements are full.  Sclerae clear.  Oropharynx is clear.  NECK:  Supple, without JVD, adenopathy, thyromegaly, or bruits.  LUNGS:  Clear.  CARDIAC:  Reveals regular rate and rhythm.  Normal S1 and S2, with a small  1/6 late systolic murmur heard in the apex.  There is no S3.  ABDOMEN:  Soft, nontender, without masses.  Bowel sounds are positive.  There is no hepatosplenomegaly.  EXTREMITIES:  Femoral and pulses 2+ and symmetric.  NEUROLOGIC:  Intact.   LABORATORY DATA:  ECG shows normal sinus rhythm, with hyperacute ST  elevation in leads V2-V4 of 2-3 mm, consistent with acute anterior injury.  CK-MB was 9.6, troponin 1.01.  Hemoglobin 11.2.  Sodium 127, potassium 5,  chloride 102, CO2 20, BUN 34, creatinine 0.4, glucose 204.   IMPRESSION:  1.  Acute anterior myocardial infarction.  2.  Hypertension.  3.  Hyponatremia.  4.  History of atrial fibrillation.  5.  Arthritis.  6.  Hyperglycemia.   PLAN:  The patient was treated in the emergency room with aspirin, IV  nitroglycerin, heparin, and morphine.  She also received Lopressor 5 mg IV x  3.  She will be taken for emergent cardiac catheterization and percutaneous  intervention if feasible.       PMJ/MEDQ  D:  04/24/2005  T:  04/24/2005  Job:  [id number]   cc:   Fabio Asa, M.D.  [address] [place] [id number]  Fax: [phone]   Ander Slade. Rockwell Alexandria, M.D.  [facility]  [place] [address]  Fax: [phone]

## 2011-03-06 NOTE — Consult Note (Signed)
[name], [name]      ACCOUNT NO.:  [id number]   MEDICAL RECORD NO.:  [id number]          PATIENT TYPE:  INP   LOCATION:  1825                         FACILITY:  [facility]   PHYSICIAN:  Princess Bruins. Hickling, M.D.DATE OF BIRTH:  02-07-1923   DATE OF CONSULTATION:  11/14/2005  DATE OF DISCHARGE:                                   CONSULTATION   CHIEF COMPLAINT:  Weakness.   HISTORY OF PRESENT CONDITION:  [name] is an 77 year old right  handed woman who presents with a 2 to 3 week history of generalized weakness  that has worsened over the past week. She has been unable to walk, unable to  sit up.   The patient's symptoms have occurred in the setting of an upper respiratory  infection, however, the weakness started prior to that time. The patient was  treated with a Z-pack for this about 5 days ago.   The patient has a diagnosis of polymyalgia rheumatica, which was present a  number of years ago but resurfaced in November 2006. The patient remembers  her sedimentation rate as 140. She was placed on prednisone 10 mg a day. The  patient had a right hip fracture diagnosed on MRI October 18, 2005 and had  mild L4-L5 lumbar spinal stenosis. An MRI diagnosed the same day. She  received a lumbar injection by Dr. Nelva Bush on Monday of this week. When she  was seen in the office earlier this week, she had a sodium of 125, chloride  of 93, bicarbonate of 21, glucose of 195. She was found to possibly have  SIADH and recommendations were made to curtail her fluid intake. She  continued to get weaker and presented to the emergency room today where she  was seen by Dr. Mertha Finders and admitted. I was asked to see her to help  determine the etiology for her generalized weakness.   PAST MEDICAL HISTORY:  Remarkable for hypertension, for Takotsubo carditis,  which caused marked atrial enlargement. Normal coronary arteries. Ejection  fraction dropped to 10% to 15% in July of 2006. It  recovered and she now has  an ejection fraction that is normal. The patient had ventricular tachycardia  during her carditis and has not had any problems since. She had atrial  fibrillation in 2004. She is in a sinus rhythm at this time. She has  significant degenerative joint disease. Ischemic colitis in 2004.  Osteoporosis that is severe. Osteoarthritis of multiple joints in her hands,  hips, knees, and shoulders and what appears to be rheumatoid arthritis in  her hands. She has recurrent bronchitis, umbilical hernia, and status post a  left wrist fracture.   PAST SURGICAL HISTORY:  The patient has bilateral rotator cuff injuries but  only the right has been repaired. She has had sinus surgery. She has also  had bilateral iridectomies with lens re-implantation.   CURRENT MEDICATIONS:  1.  Zyrtec 10 mg daily.  2.  Darvocet N-100 as needed.  3.  Lasix 20 mg per day.  4.  Lisinopril/hydrochlorothiazide 10/12.5.  5.  Prilosec 20 mg daily.  6.  Coreg 12.5 mg twice daily.  7.  Aspirin  81 mg daily.  8.  She has NOT been on prednisone for the last couple of days but takes 10      mg daily.  9.  She is on a nasal spray of some sort.   ALLERGIES:  AVELOX (causes increased weakness), DURAGESIC (causes increased  weakness). She may have allergies also to SULFA.   FAMILY HISTORY:  Mother died of leukemia at age 59. Father was 32 and 1/2  and died of old age.   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 She  is married. Her husband is 22 years old and healthy. She has 4 children. She  does not use tobacco. Occasionally uses alcohol. She exercised regularly  until July 2006, when she had her heart disease.   REVIEW OF SYSTEMS:  Remarkable for increased generalized weakness without  myalgias. Twelve system review is otherwise negative.   PHYSICAL EXAMINATION:  VITAL SIGNS:  Temperature 98.1, blood pressure  145/66, resting pulse 66, respiratory rate 22. Oxygen  saturation 99% on room  air.  HEENT:  No bruits.  LUNGS:  Clear.  HEART:  No murmurs. Pulse is normal.  ABDOMEN:  Protuberant. Bowel sounds normal. No hepatosplenomegaly.  EXTREMITIES:  Negative. Painful right buttocks.  NEUROLOGIC:  The patient is awake and alert. Without dysphagia or dyspraxia.  Cranial nerves round reactive pupils. Status post iridectomy. Fundi appear  normal. Visual fields are full. Symmetric facial strength. Midline tongue  and uvula. Extraocular movements full. She was able to protrude her tongue  and elevate her uvula to midline. Air conduction greater than bone  conduction on the left. Decreased acuity air conduction equal to bone  conduction on the right. Motor examination, deltoid 4 over 5. She has pain  biceps and triceps 4 over 5 without pain. Wrist extensors and flexors 4+  over 5. Grip 4+ over 5. Her fine motor movements and her grip are altered by  her arthritis. Hip flexor 3, psoas 5, knee flexor 4, extensor 5, foot  dorsiflexors and plantar flexors 5. Sensory examination, stocking neuropathy  to mid calf. On decreased vibration proprioception in the legs. It is fairly  normal in the upper extremities. Normal stereognosis. Cerebellar  examination, good finger to nose. Rapid repetitive movements. Gait not  tested. Deep tendon reflexes absent. The patient had bilateral flexor  plantar responses.   IMPRESSION:  1.  Myopathy, unknown etiology.  2.  Hyponatremia.  3.  Polymyalgia rheumatica with increased sedimentation rate.  4.  Lumbar spinal stenosis.  5.  Hypertension.   PLAN:  I have reviewed her CT scan. It shows multiple lacunar infarctions in  the basal ganglia, left greater than right. Diffuse atrophy as well. She has  multiple other medical problems noted above as well as peripheral  neuropathy. Plan CPK, erythrocyte sedimentation rate, serum protein electrophoresis, serum immuno-electrophoresis, basic metabolic panel.  Hydrate the patient.  Restore her sodium to normal. I doubt this is the  etiology. She will also need PT and OT.      Princess Bruins. Gaynell Face, M.D.  Electronically Signed     WHH/MEDQ  D:  11/14/2005  T:  11/15/2005  Job:  [id number]   cc:   Ander Slade. Rockwell Alexandria, M.D.  Fax: [phone]   Dwaine Deter, M.D.  Fax: [phone]

## 2011-06-10 HISTORY — PX: EYE SURGERY: SHX253

## 2011-08-04 LAB — IRON AND TIBC
Iron: 26 — ABNORMAL LOW
Saturation Ratios: 15 — ABNORMAL LOW
TIBC: 178 — ABNORMAL LOW
UIBC: 152

## 2011-08-04 LAB — COMPREHENSIVE METABOLIC PANEL
Albumin: 2.1 — ABNORMAL LOW
Alkaline Phosphatase: 129 — ABNORMAL HIGH
BUN: 33 — ABNORMAL HIGH
CO2: 24
Chloride: 97
Creatinine, Ser: 1.49 — ABNORMAL HIGH
GFR calc non Af Amer: 34 — ABNORMAL LOW
Glucose, Bld: 108 — ABNORMAL HIGH
Potassium: 4.6
Total Bilirubin: 0.3

## 2011-08-04 LAB — BASIC METABOLIC PANEL
BUN: 5 — ABNORMAL LOW
BUN: 7
CO2: 23
Calcium: 8.5
Chloride: 100
Chloride: 92 — ABNORMAL LOW
Chloride: 96
Creatinine, Ser: 0.71
Creatinine, Ser: 0.78
GFR calc Af Amer: >60
Glucose, Bld: 74
Glucose, Bld: 78
Glucose, Bld: 92
Potassium: 4
Potassium: 4.4
Sodium: 127 — ABNORMAL LOW
Sodium: 134 — ABNORMAL LOW

## 2011-08-04 LAB — FERRITIN: Ferritin: 208 (ref 10–291)

## 2011-08-04 LAB — CBC
HCT: 20.5 — ABNORMAL LOW
HCT: 32 — ABNORMAL LOW
Hemoglobin: 11.1 — ABNORMAL LOW
Hemoglobin: 6.8 — CL
MCV: 82.2
MCV: 87.3
Platelets: 527 — ABNORMAL HIGH
Platelets: 682 — ABNORMAL HIGH
RBC: 2.49 — ABNORMAL LOW
WBC: 13 — ABNORMAL HIGH
WBC: 8

## 2011-08-04 LAB — DIFFERENTIAL
Basophils Absolute: 0
Basophils Relative: 0
Lymphocytes Relative: 10 — ABNORMAL LOW
Monocytes Absolute: 1 — ABNORMAL HIGH
Neutro Abs: 10.5 — ABNORMAL HIGH

## 2011-08-04 LAB — CROSSMATCH: Antibody Screen: NEGATIVE

## 2011-08-04 LAB — HEMOGLOBIN
Hemoglobin: 7.9 — CL
Hemoglobin: 9.7 — ABNORMAL LOW

## 2011-08-04 LAB — HEMATOCRIT: HCT: 28.6 — ABNORMAL LOW

## 2011-08-04 LAB — HEMOGLOBIN AND HEMATOCRIT, BLOOD: HCT: 31.6 — ABNORMAL LOW

## 2011-08-04 LAB — OCCULT BLOOD X 1 CARD TO LAB, STOOL: Fecal Occult Bld: POSITIVE

## 2011-09-16 ENCOUNTER — Other Ambulatory Visit: Payer: Self-pay | Admitting: Geriatric Medicine

## 2011-09-16 DIAGNOSIS — R1011 Right upper quadrant pain: Secondary | ICD-10-CM

## 2011-09-17 ENCOUNTER — Ambulatory Visit
Admission: RE | Admit: 2011-09-17 | Discharge: 2011-09-17 | Disposition: A | Discharge status: Home / Self Care | Payer: Medicare Other | Source: Ambulatory Visit | Attending: Geriatric Medicine | Admitting: Geriatric Medicine

## 2011-09-17 DIAGNOSIS — R1011 Right upper quadrant pain: Secondary | ICD-10-CM

## 2011-10-07 ENCOUNTER — Ambulatory Visit (INDEPENDENT_AMBULATORY_CARE_PROVIDER_SITE_OTHER): Payer: Self-pay | Admitting: Surgery

## 2011-10-08 ENCOUNTER — Ambulatory Visit (INDEPENDENT_AMBULATORY_CARE_PROVIDER_SITE_OTHER): Payer: Medicare Other | Admitting: Surgery

## 2011-10-08 ENCOUNTER — Other Ambulatory Visit (INDEPENDENT_AMBULATORY_CARE_PROVIDER_SITE_OTHER): Payer: Self-pay | Admitting: Surgery

## 2011-10-08 ENCOUNTER — Encounter (INDEPENDENT_AMBULATORY_CARE_PROVIDER_SITE_OTHER): Payer: Self-pay | Admitting: Surgery

## 2011-10-08 DIAGNOSIS — K219 Gastro-esophageal reflux disease without esophagitis: Secondary | ICD-10-CM

## 2011-10-08 DIAGNOSIS — M353 Polymyalgia rheumatica: Secondary | ICD-10-CM

## 2011-10-08 DIAGNOSIS — I10 Essential (primary) hypertension: Secondary | ICD-10-CM

## 2011-10-08 NOTE — Progress Notes (Signed)
Chief Complaint:  Cholecystitis with multiple small gallstones and large umbilical hernia  History of Present Illness:  [name] is an 77 y.o. female who I have known since 1996 when a non-operatively managed her perforated diverticulitis and abscess formation with percutaneous drainage and antibiotics and observation. I saw her in 2007 for a large umbilical hernia but were unable to complete that surgery because of a bout of steroid-induced neuropathy. She comes in now with a history of bandlike severe upper abdominal pain and multiple small gallstones on ultrasound. Her umbilical hernia is about the size of a silver dollar. Interestingly have a daughter who had what sounds like biliary pancreatitis.  Past Medical History  Diagnosis Date  . Arthritis     Past Surgical History  Procedure Date  . Eye surgery 06/10/11    membrane peel   . Rotator cuff repair   . Nasal sinus surgery   . Wisdom tooth extraction     Medications Prior to Admission  Medication Sig Dispense Refill  . amLODipine (NORVASC) 2.5 MG tablet       . azelastine (ASTELIN) 137 MCG/SPRAY nasal spray       . buPROPion (WELLBUTRIN XL) 150 MG 24 hr tablet       . carvedilol (COREG) 25 MG tablet       . cloNIDine (CATAPRES) 0.1 MG tablet       . demeclocycline (DECLOMYCIN) 150 MG tablet       . furosemide (LASIX) 20 MG tablet       . omeprazole (PRILOSEC) 20 MG capsule       . ramipril (ALTACE) 10 MG capsule       . traMADol (ULTRAM-ER) 300 MG 24 hr tablet        No current facility-administered medications on file as of 10/08/2011.   Allergies  Allergen Reactions  . Lidocaine     Rash     Family History  Problem Relation Age of Onset  . Cancer Mother    Social History:   reports that she has never smoked. She has never used smokeless tobacco. She reports that she does not drink alcohol or use illicit drugs.   REVIEW OF SYSTEMS - PERTINENT POSITIVES ONLY: Review of systems is positive for  severe arthritis, hearing loss.  Physical Exam:   Blood pressure 144/68, pulse 60, temperature 97 F (36.1 C), temperature source Temporal, resp. rate 16, height 5\' 3"  (1.6 m), weight 129 lb 2 oz (58.571 kg). Body mass index is 22.87 kg/(m^2).  Gen:  No acute distress.  Thin and elderly appearing   Neurological: Alert and oriented to person, place, and time. Coordination normal.  Head: Normocephalic and atraumatic.  Eyes: Conjunctivae are normal. Pupils are equal, round, and reactive to light. No scleral icterus.  Neck: Normal range of motion. Neck supple. No tracheal deviation or thyromegaly present.  Cardiovascular:  SR without murmurs or gallops Respiratory: Effort normal.  No respiratory distress. No chest wall tenderness. Breath sounds normal.  No wheezes, rales or rhonchi.  GI: Soft. Bowel sounds are normal. The abdomen is soft and nontender.  There is a large soft protuberant umbilical hernia GU:   Musculoskeletal: Normal range of motion. Extremities are nontender but arthritis abounds in her joints Lymphadenopathy: No cervical, preauricular, postauricular or axillary adenopathy is present Skin: Skin is warm and dry. No rash noted. No diaphoresis. No erythema. No pallor. No clubbing, cyanosis, or edema.  Pscyh: Normal mood and affect. Behavior is normal. Judgment and thought  content normal.   LABORATORY RESULTS: No results found for this or any previous visit (from the past 48 hour(s)).  RADIOLOGY RESULTS: No results found.  Problem List: Active Problems:  * No active hospital problems. *    Assessment & Plan: Symptomatic gallstones and umbilical hernia. Patient is aware of the risks of this surgery and informed consent was obtained. We'll proceed with laparoscopic cholecystectomy and umbilical hernia repair at [facility]. Hassell Done, MD, [facility] [phone] [address]eper [phone]  10/08/2011 11:54 AM

## 2011-10-08 NOTE — Patient Instructions (Signed)
Low fat diet until surgery

## 2011-10-26 ENCOUNTER — Encounter (HOSPITAL_COMMUNITY): Payer: Self-pay | Admitting: Pharmacy Technician

## 2011-10-26 DIAGNOSIS — H35379 Puckering of macula, unspecified eye: Secondary | ICD-10-CM | POA: Diagnosis not present

## 2011-10-26 DIAGNOSIS — H353190 Nonexudative age-related macular degeneration, unspecified eye, stage unspecified: Secondary | ICD-10-CM | POA: Diagnosis not present

## 2011-10-26 DIAGNOSIS — H35359 Cystoid macular degeneration, unspecified eye: Secondary | ICD-10-CM | POA: Diagnosis not present

## 2011-11-03 ENCOUNTER — Encounter (HOSPITAL_COMMUNITY)
Admission: RE | Admit: 2011-11-03 | Discharge: 2011-11-03 | Disposition: A | Discharge status: Home / Self Care | Payer: Medicare Other | Source: Ambulatory Visit | Attending: Surgery | Admitting: Surgery

## 2011-11-03 ENCOUNTER — Other Ambulatory Visit: Payer: Self-pay

## 2011-11-03 ENCOUNTER — Ambulatory Visit (HOSPITAL_COMMUNITY)
Admission: RE | Admit: 2011-11-03 | Discharge: 2011-11-03 | Disposition: A | Discharge status: Home / Self Care | Payer: Medicare Other | Source: Ambulatory Visit | Attending: Surgery | Admitting: Surgery

## 2011-11-03 ENCOUNTER — Encounter (HOSPITAL_COMMUNITY): Payer: Self-pay

## 2011-11-03 DIAGNOSIS — Z01812 Encounter for preprocedural laboratory examination: Secondary | ICD-10-CM | POA: Insufficient documentation

## 2011-11-03 DIAGNOSIS — Z01811 Encounter for preprocedural respiratory examination: Secondary | ICD-10-CM | POA: Diagnosis not present

## 2011-11-03 DIAGNOSIS — Z9089 Acquired absence of other organs: Secondary | ICD-10-CM | POA: Diagnosis not present

## 2011-11-03 DIAGNOSIS — Z01818 Encounter for other preprocedural examination: Secondary | ICD-10-CM | POA: Diagnosis not present

## 2011-11-03 DIAGNOSIS — Z01810 Encounter for preprocedural cardiovascular examination: Secondary | ICD-10-CM | POA: Insufficient documentation

## 2011-11-03 DIAGNOSIS — I440 Atrioventricular block, first degree: Secondary | ICD-10-CM | POA: Insufficient documentation

## 2011-11-03 DIAGNOSIS — I498 Other specified cardiac arrhythmias: Secondary | ICD-10-CM | POA: Diagnosis not present

## 2011-11-03 HISTORY — DX: Gastro-esophageal reflux disease without esophagitis: K21.9

## 2011-11-03 HISTORY — DX: Essential (primary) hypertension: I10

## 2011-11-03 LAB — BASIC METABOLIC PANEL
Calcium: 10 mg/dL (ref 8.4–10.5)
Creatinine, Ser: 1.29 mg/dL — ABNORMAL HIGH (ref 0.50–1.10)
GFR calc non Af Amer: 36 mL/min — ABNORMAL LOW (ref >90)
Glucose, Bld: 94 mg/dL (ref 70–99)
Sodium: 134 mEq/L — ABNORMAL LOW (ref 135–145)

## 2011-11-03 LAB — SURGICAL PCR SCREEN: MRSA, PCR: NEGATIVE

## 2011-11-03 LAB — CBC
Hemoglobin: 11.5 g/dL — ABNORMAL LOW (ref 12.0–15.0)
MCH: 30.3 pg (ref 26.0–34.0)
MCHC: 32.7 g/dL (ref 30.0–36.0)
Platelets: 361 10*3/uL (ref 150–400)

## 2011-11-03 NOTE — Patient Instructions (Addendum)
[place]  11/03/2011   Your procedure is scheduled on:  Tuesday 11/10/2011  Report to [place] at [place] AM.  Call this number if you have problems the morning of surgery: [phone]   Remember: USE FLEET"S ENEMA NIGHT BEFORE SURGERY ON MON. 11/09/2011! Also remember to STOP Aspirin or Motrin, Advil Ibuprofen 5 days before surgery!   Do not eat food:After Midnight.  May have clear liquids:until Midnight .  Clear liquids include soda, tea, black coffee, apple or grape juice, broth.  Take these medicines the morning of surgery with A SIP OF WATER: Norvasc, Coreg, Catapress,Zyrtec,Prilosec,Lyrica, Wellbutrin, may use Astelin nasal spray and Miacalcin nasal spray   Do not wear jewelry, make-up or nail polish.  Do not wear lotions, powders, or perfumes.   Do not shave 48 hours prior to surgery.(women only-shaving legs)  Do not bring valuables to the hospital.  Contacts, dentures or bridgework may not be worn into surgery.  Leave suitcase in the car. After surgery it may be brought to your room.  For patients admitted to the hospital, checkout time is 11:00 AM the day of discharge.   Patients discharged the day of surgery will not be allowed to drive home.  Name and phone number of your driver:   Special Instructions: CHG Shower Use Special Wash: 1/2 bottle night before surgery and 1/2 bottle morning of surgery.   Please read over the following fact sheets that you were given: MRSA Information

## 2011-11-09 NOTE — Anesthesia Preprocedure Evaluation (Addendum)
Anesthesia Evaluation  Patient identified by MRN, date of birth, ID band Patient awake    Reviewed: Allergy & Precautions, H&P , NPO status , Patient's Chart, lab work & pertinent test results, reviewed documented beta blocker date and time   Airway Mallampati: II TM Distance: >3 FB Neck ROM: full    Dental  (+) Missing and Caps,    Pulmonary neg pulmonary ROS,  clear to auscultation  Pulmonary exam normal       Cardiovascular Exercise Tolerance: Good hypertension, On Home Beta Blockers + dysrhythmias Atrial Fibrillation + Valvular Problems/Murmurs regular Normal+ Systolic murmurs Episode A Fib.  NSR now   Neuro/Psych Negative Neurological ROS  Negative Psych ROS   GI/Hepatic negative GI ROS, Neg liver ROS, GERD-  Medicated and Controlled,  Endo/Other  Negative Endocrine ROS  Renal/GU negative Renal ROS  Genitourinary negative   Musculoskeletal   Abdominal   Peds  Hematology negative hematology ROS (+)   Anesthesia Other Findings   Reproductive/Obstetrics negative OB ROS                        Anesthesia Physical Anesthesia Plan  ASA: III  Anesthesia Plan: General   Post-op Pain Management:    Induction: Intravenous  Airway Management Planned: Oral ETT  Additional Equipment:   Intra-op Plan:   Post-operative Plan: Extubation in OR  Informed Consent: I have reviewed the patients History and Physical, chart, labs and discussed the procedure including the risks, benefits and alternatives for the proposed anesthesia with the patient or authorized representative who has indicated his/her understanding and acceptance.   Dental Advisory Given  Plan Discussed with: CRNA and Surgeon  Anesthesia Plan Comments:         Anesthesia Quick Evaluation

## 2011-11-10 ENCOUNTER — Inpatient Hospital Stay (HOSPITAL_COMMUNITY): Payer: Medicare Other | Admitting: Anesthesiology

## 2011-11-10 ENCOUNTER — Encounter (HOSPITAL_COMMUNITY): Payer: Self-pay | Admitting: *Deleted

## 2011-11-10 ENCOUNTER — Encounter (HOSPITAL_COMMUNITY)
Admission: RE | Disposition: A | Discharge status: Home / Self Care | Payer: Self-pay | Source: Ambulatory Visit | Attending: Surgery

## 2011-11-10 ENCOUNTER — Inpatient Hospital Stay (HOSPITAL_COMMUNITY): Payer: Medicare Other

## 2011-11-10 ENCOUNTER — Ambulatory Visit (HOSPITAL_COMMUNITY)
Admission: RE | Admit: 2011-11-10 | Discharge: 2011-11-11 | Disposition: A | Discharge status: Home / Self Care | Payer: Medicare Other | Source: Ambulatory Visit | Attending: Surgery | Admitting: Surgery

## 2011-11-10 ENCOUNTER — Encounter (HOSPITAL_COMMUNITY): Payer: Self-pay | Admitting: Anesthesiology

## 2011-11-10 ENCOUNTER — Other Ambulatory Visit (INDEPENDENT_AMBULATORY_CARE_PROVIDER_SITE_OTHER): Payer: Self-pay | Admitting: Surgery

## 2011-11-10 DIAGNOSIS — K8010 Calculus of gallbladder with chronic cholecystitis without obstruction: Principal | ICD-10-CM | POA: Insufficient documentation

## 2011-11-10 DIAGNOSIS — Z79899 Other long term (current) drug therapy: Secondary | ICD-10-CM | POA: Diagnosis not present

## 2011-11-10 DIAGNOSIS — I10 Essential (primary) hypertension: Secondary | ICD-10-CM | POA: Diagnosis not present

## 2011-11-10 DIAGNOSIS — M353 Polymyalgia rheumatica: Secondary | ICD-10-CM

## 2011-11-10 DIAGNOSIS — K429 Umbilical hernia without obstruction or gangrene: Secondary | ICD-10-CM | POA: Insufficient documentation

## 2011-11-10 DIAGNOSIS — K219 Gastro-esophageal reflux disease without esophagitis: Secondary | ICD-10-CM | POA: Insufficient documentation

## 2011-11-10 DIAGNOSIS — I4891 Unspecified atrial fibrillation: Secondary | ICD-10-CM | POA: Insufficient documentation

## 2011-11-10 DIAGNOSIS — K819 Cholecystitis, unspecified: Secondary | ICD-10-CM | POA: Diagnosis not present

## 2011-11-10 DIAGNOSIS — K8020 Calculus of gallbladder without cholecystitis without obstruction: Secondary | ICD-10-CM | POA: Diagnosis not present

## 2011-11-10 HISTORY — PX: CHOLECYSTECTOMY: SHX55

## 2011-11-10 HISTORY — PX: UMBILICAL HERNIA REPAIR: SHX196

## 2011-11-10 LAB — CBC
HCT: 31.2 % — ABNORMAL LOW (ref 36.0–46.0)
Hemoglobin: 10.4 g/dL — ABNORMAL LOW (ref 12.0–15.0)
MCH: 31.3 pg (ref 26.0–34.0)
MCHC: 33.3 g/dL (ref 30.0–36.0)
RBC: 3.32 MIL/uL — ABNORMAL LOW (ref 3.87–5.11)

## 2011-11-10 LAB — CREATININE, SERUM
Creatinine, Ser: 0.97 mg/dL (ref 0.50–1.10)
GFR calc non Af Amer: 51 mL/min — ABNORMAL LOW (ref >90)

## 2011-11-10 SURGERY — LAPAROSCOPIC CHOLECYSTECTOMY WITH INTRAOPERATIVE CHOLANGIOGRAM
Anesthesia: General | Wound class: Clean Contaminated

## 2011-11-10 MED ORDER — MIDAZOLAM HCL 5 MG/5ML IJ SOLN
INTRAMUSCULAR | Status: DC | PRN
  Administered 2011-11-10: 0.5 mg via INTRAVENOUS

## 2011-11-10 MED ORDER — ROCURONIUM BROMIDE 100 MG/10ML IV SOLN
INTRAVENOUS | Status: DC | PRN
  Administered 2011-11-10: 10 mg via INTRAVENOUS
  Administered 2011-11-10: 20 mg via INTRAVENOUS

## 2011-11-10 MED ORDER — CEFOXITIN SODIUM-DEXTROSE 1-4 GM-% IV SOLR (PREMIX)
INTRAVENOUS | Status: AC
  Filled 2011-11-10: qty 50

## 2011-11-10 MED ORDER — HYDROMORPHONE HCL PF 1 MG/ML IJ SOLN
0.2500 mg | INTRAMUSCULAR | Status: DC | PRN
  Administered 2011-11-10 (×2): 0.25 mg via INTRAVENOUS

## 2011-11-10 MED ORDER — GLYCOPYRROLATE 0.2 MG/ML IJ SOLN
INTRAMUSCULAR | Status: DC | PRN
  Administered 2011-11-10: .6 mg via INTRAVENOUS

## 2011-11-10 MED ORDER — LACTATED RINGERS IR SOLN
Status: DC | PRN
  Administered 2011-11-10: 1

## 2011-11-10 MED ORDER — ACETAMINOPHEN 10 MG/ML IV SOLN
INTRAVENOUS | Status: DC | PRN
  Administered 2011-11-10: 1000 mg via INTRAVENOUS

## 2011-11-10 MED ORDER — HYDROMORPHONE HCL PF 1 MG/ML IJ SOLN
INTRAMUSCULAR | Status: AC
  Filled 2011-11-10: qty 1

## 2011-11-10 MED ORDER — OXYCODONE-ACETAMINOPHEN 5-325 MG PO TABS
1.0000 | ORAL_TABLET | ORAL | Status: DC | PRN
  Administered 2011-11-11: 1 via ORAL
  Filled 2011-11-10: qty 1

## 2011-11-10 MED ORDER — LACTATED RINGERS IV SOLN
INTRAVENOUS | Status: DC | PRN
  Administered 2011-11-10: 07:00:00 via INTRAVENOUS

## 2011-11-10 MED ORDER — CARVEDILOL 25 MG PO TABS
25.0000 mg | ORAL_TABLET | Freq: Two times a day (BID) | ORAL | Status: DC
  Administered 2011-11-11: 25 mg via ORAL
  Filled 2011-11-10 (×4): qty 1

## 2011-11-10 MED ORDER — LACTATED RINGERS IV SOLN
INTRAVENOUS | Status: DC
  Administered 2011-11-10: 125 mL/h via INTRAVENOUS

## 2011-11-10 MED ORDER — IOHEXOL 300 MG/ML  SOLN
INTRAMUSCULAR | Status: DC | PRN
  Administered 2011-11-10: 09:00:00

## 2011-11-10 MED ORDER — MORPHINE SULFATE 2 MG/ML IJ SOLN
0.5000 mg | INTRAMUSCULAR | Status: DC | PRN
  Administered 2011-11-10 – 2011-11-11 (×3): 0.5 mg via INTRAVENOUS
  Filled 2011-11-10 (×3): qty 1

## 2011-11-10 MED ORDER — BUPIVACAINE LIPOSOME 1.3 % IJ SUSP
20.0000 mL | INTRAMUSCULAR | Status: AC
  Administered 2011-11-10: 17 mL
  Filled 2011-11-10: qty 20

## 2011-11-10 MED ORDER — SODIUM CHLORIDE 0.9 % IR SOLN
Status: DC | PRN
  Administered 2011-11-10: 1

## 2011-11-10 MED ORDER — IOHEXOL 300 MG/ML  SOLN
INTRAMUSCULAR | Status: AC
  Filled 2011-11-10: qty 1

## 2011-11-10 MED ORDER — FENTANYL CITRATE 0.05 MG/ML IJ SOLN
INTRAMUSCULAR | Status: DC | PRN
  Administered 2011-11-10 (×4): 25 ug via INTRAVENOUS

## 2011-11-10 MED ORDER — DICLOFENAC SODIUM 1 % TD GEL
1.0000 "application " | Freq: Four times a day (QID) | TRANSDERMAL | Status: DC | PRN
  Filled 2011-11-10: qty 100

## 2011-11-10 MED ORDER — MENTHOL 3 MG MT LOZG
1.0000 | LOZENGE | OROMUCOSAL | Status: DC | PRN
  Administered 2011-11-10: 3 mg via ORAL
  Filled 2011-11-10: qty 9

## 2011-11-10 MED ORDER — DEXTROSE 5 % IV SOLN
1.0000 g | INTRAVENOUS | Status: DC
  Filled 2011-11-10: qty 1

## 2011-11-10 MED ORDER — EPHEDRINE SULFATE 50 MG/ML IJ SOLN
INTRAMUSCULAR | Status: DC | PRN
  Administered 2011-11-10: 5 mg via INTRAVENOUS

## 2011-11-10 MED ORDER — ETOMIDATE 2 MG/ML IV SOLN
INTRAVENOUS | Status: DC | PRN
  Administered 2011-11-10: 8 mg via INTRAVENOUS

## 2011-11-10 MED ORDER — PROMETHAZINE HCL 25 MG/ML IJ SOLN: 6.2500 mg | INTRAMUSCULAR | Status: DC | PRN

## 2011-11-10 MED ORDER — ASPIRIN EC 81 MG PO TBEC
81.0000 mg | DELAYED_RELEASE_TABLET | Freq: Every day | ORAL | Status: DC
  Administered 2011-11-10: 81 mg via ORAL
  Filled 2011-11-10 (×2): qty 1

## 2011-11-10 MED ORDER — ONDANSETRON HCL 4 MG/2ML IJ SOLN
INTRAMUSCULAR | Status: DC | PRN
  Administered 2011-11-10: 4 mg via INTRAVENOUS

## 2011-11-10 MED ORDER — SUCCINYLCHOLINE CHLORIDE 20 MG/ML IJ SOLN
INTRAMUSCULAR | Status: DC | PRN
  Administered 2011-11-10: 100 mg via INTRAVENOUS

## 2011-11-10 MED ORDER — VITAMINS A & D EX OINT
TOPICAL_OINTMENT | CUTANEOUS | Status: AC
  Filled 2011-11-10: qty 5

## 2011-11-10 MED ORDER — HEPARIN SODIUM (PORCINE) 5000 UNIT/ML IJ SOLN
5000.0000 [IU] | Freq: Once | INTRAMUSCULAR | Status: AC
  Administered 2011-11-10: 5000 [IU] via SUBCUTANEOUS

## 2011-11-10 MED ORDER — ACETAMINOPHEN 10 MG/ML IV SOLN
INTRAVENOUS | Status: AC
  Filled 2011-11-10: qty 100

## 2011-11-10 MED ORDER — NEOSTIGMINE METHYLSULFATE 1 MG/ML IJ SOLN
INTRAMUSCULAR | Status: DC | PRN
  Administered 2011-11-10: 3 mg via INTRAVENOUS

## 2011-11-10 MED ORDER — HEPARIN SODIUM (PORCINE) 5000 UNIT/ML IJ SOLN
5000.0000 [IU] | Freq: Three times a day (TID) | INTRAMUSCULAR | Status: DC
  Administered 2011-11-10 – 2011-11-11 (×2): 5000 [IU] via SUBCUTANEOUS
  Filled 2011-11-10 (×5): qty 1

## 2011-11-10 SURGICAL SUPPLY — 62 items
APL SKNCLS STERI-STRIP NONHPOA (GAUZE/BANDAGES/DRESSINGS) ×1
APPLIER CLIP 5 13 M/L LIGAMAX5 (MISCELLANEOUS)
APPLIER CLIP ROT 10 11.4 M/L (STAPLE)
APR CLP MED LRG 11.4X10 (STAPLE)
APR CLP MED LRG 5 ANG JAW (MISCELLANEOUS)
BAG SPEC RTRVL LRG 6X4 10 (ENDOMECHANICALS) ×1
BENZOIN TINCTURE PRP APPL 2/3 (GAUZE/BANDAGES/DRESSINGS) ×2 IMPLANT
BLADE HEX COATED 2.75 (ELECTRODE) ×2 IMPLANT
BLADE SURG 15 STRL LF DISP TIS (BLADE) ×1 IMPLANT
BLADE SURG 15 STRL SS (BLADE) ×2
BLADE SURG SZ10 CARB STEEL (BLADE) ×2 IMPLANT
CABLE HIGH FREQUENCY MONO STRZ (ELECTRODE) ×1 IMPLANT
CANISTER SUCTION 2500CC (MISCELLANEOUS) ×2 IMPLANT
CATH REDDICK CHOLANGI 4FR 50CM (CATHETERS) ×1 IMPLANT
CLIP APPLIE 5 13 M/L LIGAMAX5 (MISCELLANEOUS) IMPLANT
CLIP APPLIE ROT 10 11.4 M/L (STAPLE) IMPLANT
CLOTH BEACON ORANGE TIMEOUT ST (SAFETY) ×2 IMPLANT
COVER MAYO STAND STRL (DRAPES) ×2 IMPLANT
COVER SURGICAL LIGHT HANDLE (MISCELLANEOUS) ×2 IMPLANT
DECANTER SPIKE VIAL GLASS SM (MISCELLANEOUS) ×1 IMPLANT
DRAPE C-ARM 42X72 X-RAY (DRAPES) ×2 IMPLANT
DRAPE LAPAROSCOPIC ABDOMINAL (DRAPES) ×2 IMPLANT
DRAPE LAPAROTOMY T 102X78X121 (DRAPES) ×2 IMPLANT
ELECT REM PT RETURN 9FT ADLT (ELECTROSURGICAL) ×2
ELECTRODE REM PT RTRN 9FT ADLT (ELECTROSURGICAL) ×1 IMPLANT
GLOVE BIOGEL M 8.0 STRL (GLOVE) ×2 IMPLANT
GLOVE BIOGEL PI IND STRL 7.0 (GLOVE) ×1 IMPLANT
GLOVE BIOGEL PI INDICATOR 7.0 (GLOVE) ×1
GOWN STRL NON-REIN LRG LVL3 (GOWN DISPOSABLE) ×2 IMPLANT
GOWN STRL REIN XL XLG (GOWN DISPOSABLE) ×4 IMPLANT
HEMOSTAT SURGICEL 4X8 (HEMOSTASIS) IMPLANT
IV CATH 14GX2 1/4 (CATHETERS) ×2 IMPLANT
KIT BASIN OR (CUSTOM PROCEDURE TRAY) ×2 IMPLANT
NDL HYPO 25X1 1.5 SAFETY (NEEDLE) ×1 IMPLANT
NEEDLE HYPO 22GX1.5 SAFETY (NEEDLE) ×2 IMPLANT
NEEDLE HYPO 25X1 1.5 SAFETY (NEEDLE) ×2 IMPLANT
NS IRRIG 1000ML POUR BTL (IV SOLUTION) ×2 IMPLANT
PACK BASIC VI WITH GOWN DISP (CUSTOM PROCEDURE TRAY) ×2 IMPLANT
PEN SKIN MARKING BROAD (MISCELLANEOUS) ×2 IMPLANT
PENCIL BUTTON HOLSTER BLD 10FT (ELECTRODE) ×2 IMPLANT
POUCH SPECIMEN RETRIEVAL 10MM (ENDOMECHANICALS) ×2 IMPLANT
SCISSORS LAP 5X35 DISP (ENDOMECHANICALS) ×2 IMPLANT
SET CHOLANGIOGRAPH MIX (MISCELLANEOUS) IMPLANT
SET IRRIG TUBING LAPAROSCOPIC (IRRIGATION / IRRIGATOR) ×2 IMPLANT
SLEEVE Z-THREAD 5X100MM (TROCAR) IMPLANT
SOLUTION ANTI FOG 6CC (MISCELLANEOUS) ×2 IMPLANT
SPONGE GAUZE 4X4 12PLY (GAUZE/BANDAGES/DRESSINGS) ×2 IMPLANT
SPONGE LAP 4X18 X RAY DECT (DISPOSABLE) ×4 IMPLANT
STRIP CLOSURE SKIN 1/2X4 (GAUZE/BANDAGES/DRESSINGS) ×2 IMPLANT
SUT PROLENE 0 CT 1 CR/8 (SUTURE) ×1 IMPLANT
SUT PROLENE 0 CT 2 (SUTURE) IMPLANT
SUT VIC AB 4-0 SH 18 (SUTURE) ×2 IMPLANT
SYR 30ML LL (SYRINGE) ×2 IMPLANT
SYR CONTROL 10ML LL (SYRINGE) ×2 IMPLANT
TOWEL OR 17X26 10 PK STRL BLUE (TOWEL DISPOSABLE) ×4 IMPLANT
TRAY LAP CHOLE (CUSTOM PROCEDURE TRAY) ×2 IMPLANT
TROCAR BLADELESS OPT 5 75 (ENDOMECHANICALS) ×1 IMPLANT
TROCAR XCEL BLUNT TIP 100MML (ENDOMECHANICALS) ×1 IMPLANT
TROCAR XCEL NON-BLD 11X100MML (ENDOMECHANICALS) IMPLANT
TROCAR Z-THREAD FIOS 11X100 BL (TROCAR) ×1 IMPLANT
TROCAR Z-THREAD FIOS 5X100MM (TROCAR) IMPLANT
TUBING INSUFFLATION 10FT LAP (TUBING) ×2 IMPLANT

## 2011-11-10 NOTE — Op Note (Signed)
Surgeon: Kaylyn Lim, MD, FACS  Asst:  Greer Pickerel M.D. FACS  Anes:  General  Procedure: Laparoscopic cholecystectomy with intraoperative cholangiogram with umbilical hernia repair  Diagnosis: Chronic cholecystitis with normal intraoperative cholangiogram. Golfball sized umbilical hernia repaired from rarely  Complications: None  EBL:   6 cc  Description of Procedure:  Patient was taken to room 1 have [place] and given general anesthesia. The abdomen was prepped with PCMX and draped sterilely. A timeout was performed. The patient had a pronounced chronic umbilical hernia and I elected to make a longitudinal incision down into this and easily entered the abdomen through the hernia. The fascial defect was about a centimeter in diameter and with a holding suture I was able to insert the Hassan cannula insufflate the abdomen. He has some scarring over her liver is probably related to a 15+ year ago bowel with peritonitis from perforated diverticulitis. This was the first patient I managed without surgery and she has got a long well from the standpoint of recurrent diverticulitis. Gallbladder was very floppy and almost had a phrygian cap. He was elevated and I was able to dissect the junction of the cystic duct and cystic artery across Kal[place] triangle. A placed a clip on the gallbladder incise the cystic duct and did a dynamic cholangiogram using the Reddick catheter. Unfortunately the technician using the C-arm did not know how to use it properly and did not capture the images. However I did visualize these real-time and this showed a nice cystic duct with good retrograde filling into the intrahepatic ducts visualizing the bifurcation and also demonstrating free flow into the duodenum. No stones were seen.  Catheter was removed and the cystic duct was triple clipped cystic artery was double clipped and then the gallbladder was removed without difficulty and without entering it using a hook  electrocautery. The gallbladder was placed in a bag and removed through the umbilicus. The umbilical fascial defect was then repaired primarily with 2 figure-of-eight sutures of 0 Prolene. All port sites were injected with Exparel encloses 4-0 Vicryl and Dermabond. She was taken recovery room in satisfactory condition.  Matt B. Hassell Done, [place], [facility], [place]

## 2011-11-10 NOTE — H&P (Signed)
Chief Complaint: Cholecystitis with multiple small gallstones and large umbilical hernia  History of Present Illness: [name] is an 78 y.o. female who I have known since 1996 when a non-operatively managed her perforated diverticulitis and abscess formation with percutaneous drainage and antibiotics and observation. I saw her in 2007 for a large umbilical hernia but were unable to complete that surgery because of a bout of steroid-induced neuropathy. She comes in now with a history of bandlike severe upper abdominal pain and multiple small gallstones on ultrasound. Her umbilical hernia is about the size of a silver dollar. Interestingly have a daughter who had what sounds like biliary pancreatitis.  Past Medical History   Diagnosis  Date   .  Arthritis     Past Surgical History   Procedure  Date   .  Eye surgery  06/10/11     membrane peel   .  Rotator cuff repair    .  Nasal sinus surgery    .  Wisdom tooth extraction     Medications Prior to Admission   Medication  Sig  Dispense  Refill   .  amLODipine (NORVASC) 2.5 MG tablet      .  azelastine (ASTELIN) 137 MCG/SPRAY nasal spray      .  buPROPion (WELLBUTRIN XL) 150 MG 24 hr tablet      .  carvedilol (COREG) 25 MG tablet      .  cloNIDine (CATAPRES) 0.1 MG tablet      .  demeclocycline (DECLOMYCIN) 150 MG tablet      .  furosemide (LASIX) 20 MG tablet      .  omeprazole (PRILOSEC) 20 MG capsule      .  ramipril (ALTACE) 10 MG capsule      .  traMADol (ULTRAM-ER) 300 MG 24 hr tablet       No current facility-administered medications on file as of 10/08/2011.    Allergies   Allergen  Reactions   .  Lidocaine      Rash    Family History   Problem  Relation  Age of Onset   .  Cancer  Mother     Social History: reports that she has never smoked. She has never used smokeless tobacco. She reports that she does not drink alcohol or use illicit drugs.  REVIEW OF SYSTEMS - PERTINENT POSITIVES ONLY:  Review of systems  is positive for severe arthritis, hearing loss.  Physical Exam:  Blood pressure 144/68, pulse 60, temperature 97 F (36.1 C), temperature source Temporal, resp. rate 16, height 5\' 3"  (1.6 m), weight 129 lb 2 oz (58.571 kg).  Body mass index is 22.87 kg/(m^2).  Gen: No acute distress. Thin and elderly appearing  Neurological: Alert and oriented to person, place, and time. Coordination normal.  Head: Normocephalic and atraumatic.  Eyes: Conjunctivae are normal. Pupils are equal, round, and reactive to light. No scleral icterus.  Neck: Normal range of motion. Neck supple. No tracheal deviation or thyromegaly present.  Cardiovascular: SR without murmurs or gallops  Respiratory: Effort normal. No respiratory distress. No chest wall tenderness. Breath sounds normal. No wheezes, rales or rhonchi.  GI: Soft. Bowel sounds are normal. The abdomen is soft and nontender. There is a large soft protuberant umbilical hernia  GU:  Musculoskeletal: Normal range of motion. Extremities are nontender but arthritis abounds in her joints Lymphadenopathy: No cervical, preauricular, postauricular or axillary adenopathy is present Skin: Skin is warm and dry. No rash noted. No  diaphoresis. No erythema. No pallor. No clubbing, cyanosis, or edema.  Pscyh: Normal mood and affect. Behavior is normal. Judgment and thought content normal.  LABORATORY RESULTS:  No results found for this or any previous visit (from the past 48 hour(s)).  RADIOLOGY RESULTS:  No results found.  Problem List:  Active Problems:  * No active hospital problems. *   Assessment & Plan:  Symptomatic gallstones and umbilical hernia. Patient is aware of the risks of this surgery and informed consent was obtained. We'll proceed with laparoscopic cholecystectomy and umbilical hernia repair at [place] [facility]. Hassell Done, MD, [facility]  [phone] beeper  [phone] There has been no change in the patient's  past medical history or physical exam in the past 24 hours to the best of my knowledge.  I reexamined her in the PACU/holding and PE is unchanged.  Expectations and outcome results have been discussed with the patient to include risks and benefits.  All questions have been answered and will proceed with previously discussed procedure noted and signed in the consent form in the patient's record.    Delila Kuklinski BMD @NOW  11/10/2011

## 2011-11-10 NOTE — Anesthesia Postprocedure Evaluation (Signed)
  Anesthesia Post-op Note  Patient: [name]  Procedure(s) Performed:  LAPAROSCOPIC CHOLECYSTECTOMY WITH INTRAOPERATIVE CHOLANGIOGRAM; HERNIA REPAIR UMBILICAL ADULT  Patient Location: PACU  Anesthesia Type: General  Level of Consciousness: awake and alert   Airway and Oxygen Therapy: Patient Spontanous Breathing  Post-op Pain: mild  Post-op Assessment: Post-op Vital signs reviewed, Patient's Cardiovascular Status Stable, Respiratory Function Stable, Patent Airway and No signs of Nausea or vomiting  Post-op Vital Signs: stable  Complications: No apparent anesthesia complications

## 2011-11-10 NOTE — Transfer of Care (Signed)
Immediate Anesthesia Transfer of Care Note  Patient: [name]  Procedure(s) Performed:  LAPAROSCOPIC CHOLECYSTECTOMY WITH INTRAOPERATIVE CHOLANGIOGRAM; HERNIA REPAIR UMBILICAL ADULT  Patient Location: PACU  Anesthesia Type: General  Level of Consciousness: oriented, sedated and patient cooperative  Airway & Oxygen Therapy: Patient Spontanous Breathing and Patient connected to face mask oxygen  Post-op Assessment: Report given to PACU RN, Post -op Vital signs reviewed and stable and Patient moving all extremities  Post vital signs: Reviewed and stable  Complications: No apparent anesthesia complications

## 2011-11-10 NOTE — Progress Notes (Signed)
Fleet enema done 11/09/11 with good results

## 2011-11-10 NOTE — Progress Notes (Addendum)
Rt hearing aid returned to pt by family and put in place.

## 2011-11-11 ENCOUNTER — Encounter (HOSPITAL_COMMUNITY): Payer: Self-pay | Admitting: Surgery

## 2011-11-11 MED ORDER — OXYCODONE-ACETAMINOPHEN 5-325 MG PO TABS: 1.0000 | ORAL_TABLET | ORAL | Status: AC | PRN

## 2011-11-11 NOTE — Discharge Summary (Signed)
Physician Discharge Summary  Patient ID: WENDEE GATCHELL MRN: [id number] DOB/AGE: 76-Apr-1924 76 y.o.  Admit date: 11/10/2011 Discharge date: 11/11/2011  Admission Diagnoses:  Discharge Diagnoses:  Active Problems:  * No active hospital problems. *    Discharged Condition: good  Hospital Course: Had lap chole IOC.  Did well and ready for Discharge on PD 1  Consults: none  Significant Diagnostic Studies: none  Treatments: surgery  Discharge Exam: Blood pressure 148/60, pulse 68, temperature 98.7 F (37.1 C), temperature source Oral, resp. rate 18, height 5\' 3"  (1.6 m), weight 129 lb 8 oz (58.741 kg), SpO2 95.00%. incisions ok  Disposition:   Discharge Orders    Future Orders Please Complete By Expires   Diet - low sodium heart healthy      Increase activity slowly      Discharge instructions      Comments:   followup in office in 3-4 weeks.  Call for appt.   Discharge wound care:      Comments:   May shower today.     Medication List  As of 11/11/2011  8:39 AM   TAKE these medications         acetaminophen 500 MG tablet   Commonly known as: TYLENOL   Take 500 mg by mouth every 6 (six) hours as needed. Pain        amLODipine 2.5 MG tablet   Commonly known as: NORVASC   Take 2.5 mg by mouth daily after breakfast.      aspirin EC 81 MG tablet   Take 81 mg by mouth at bedtime.      azelastine 137 MCG/SPRAY nasal spray   Commonly known as: ASTELIN   Place 2 sprays into the nose 2 (two) times daily.      buPROPion 150 MG 24 hr tablet   Commonly known as: WELLBUTRIN XL   Take 150 mg by mouth 2 (two) times daily.      calcitonin (salmon) 200 UNIT/ACT nasal spray   Commonly known as: MIACALCIN/FORTICAL   Place 1 spray into the nose daily.      CALCIUM + D PO   Take 1 tablet by mouth 2 (two) times daily.      carvedilol 25 MG tablet   Commonly known as: COREG   Take 25 mg by mouth 2 (two) times daily with a meal.      CENTRUM CARDIO PO   Take 1  tablet by mouth 2 (two) times daily.      cetirizine 10 MG tablet   Commonly known as: ZYRTEC   Take 10 mg by mouth daily.      cloNIDine 0.1 MG tablet   Commonly known as: CATAPRES   Take 0.1 mg by mouth 2 (two) times daily.      demeclocycline 150 MG tablet   Commonly known as: DECLOMYCIN   Take 150 mg by mouth 2 (two) times daily.      desonide 0.05 % ointment   Commonly known as: DESOWEN   Apply 1 application topically 2 (two) times daily.      diclofenac sodium 1 % Gel   Commonly known as: VOLTAREN   Apply 1 application topically 4 (four) times daily as needed. Pain        fish oil-omega-3 fatty acids 1000 MG capsule   Take 1 g by mouth daily.      furosemide 20 MG tablet   Commonly known as: LASIX   Take 20 mg by mouth  daily after breakfast.      methocarbamol 500 MG tablet   Commonly known as: ROBAXIN   Take 500 mg by mouth 3 (three) times daily as needed. Muscle spasm        omeprazole 20 MG capsule   Commonly known as: PRILOSEC   Take 20 mg by mouth daily.      oxyCODONE-acetaminophen 5-325 MG per tablet   Commonly known as: PERCOCET   Take 1-2 tablets by mouth every 4 (four) hours as needed.      pregabalin 50 MG capsule   Commonly known as: LYRICA   Take 50 mg by mouth daily.      ramipril 10 MG capsule   Commonly known as: ALTACE   Take 20 mg by mouth daily after breakfast.      traMADol 300 MG 24 hr tablet   Commonly known as: ULTRAM-ER   Take 300 mg by mouth daily as needed. Pain             Follow-up Information    Follow up with Laketta Soderberg B, MD in 4 weeks.   Contact information:   [employer], [place], [place] [phone]          Signed: Pedro Earls 11/11/2011, 8:39 AM

## 2011-11-11 NOTE — Progress Notes (Signed)
Pt discharged with family.  Reviewed medications, follow up appointments, and when-how to notify MD with any questions or concerns.  PIV was removed.  There were no further questions at this time.

## 2011-11-13 ENCOUNTER — Encounter (HOSPITAL_COMMUNITY): Payer: Self-pay | Admitting: Anesthesiology

## 2011-11-13 ENCOUNTER — Telehealth (INDEPENDENT_AMBULATORY_CARE_PROVIDER_SITE_OTHER): Payer: Self-pay | Admitting: Surgery

## 2011-12-03 ENCOUNTER — Ambulatory Visit (INDEPENDENT_AMBULATORY_CARE_PROVIDER_SITE_OTHER): Payer: Medicare Other | Admitting: Surgery

## 2011-12-03 DIAGNOSIS — Z9049 Acquired absence of other specified parts of digestive tract: Secondary | ICD-10-CM

## 2011-12-03 DIAGNOSIS — Z9089 Acquired absence of other organs: Secondary | ICD-10-CM

## 2011-12-03 NOTE — Progress Notes (Signed)
[name] 78 y.o.  There is no height or weight on file to calculate BMI.  Patient Active Problem List  Diagnoses  . Polymyalgia rheumatica  . Essential hypertension, benign  . Esophageal reflux    Allergies  Allergen Reactions  . Amlodipine Besylate     Swelling feet ankles-pt. States is not aware of this allergy!  . Avelox (Moxifloxacin Hcl In Nacl) Other (See Comments)    Pt does not remember   . Duragesic Disc Transdermal System (Alcohol-Fentanyl)   . Forteo Other (See Comments)    Made skin dry  . Lidocaine     Rash    . Morphine And Related     "My Sister and daughter can,t take it. I've had it before without problems."    Past Surgical History  Procedure Date  . Eye surgery 06/10/11    membrane peel   . Rotator cuff repair   . Nasal sinus surgery   . Wisdom tooth extraction   . Cardiac catheterization   . Cholecystectomy 11/10/2011    Procedure: LAPAROSCOPIC CHOLECYSTECTOMY WITH INTRAOPERATIVE CHOLANGIOGRAM;  Surgeon: Pedro Earls, MD;  Location: [facility];  Service: General;  Laterality: N/A;  . Umbilical hernia repair [id number]    Procedure: HERNIA REPAIR UMBILICAL ADULT;  Surgeon: Pedro Earls, MD;  Location: [facility];  Service: General;  Laterality: N/A;   Mathews Argyle, MD, MD No diagnosis found.  [name] And her husband of 75 years came in today in followup after her laparoscopic cholecystectomy and closure of her umbilical hernia. Her incisions have healed fine. She has gone through the surgery without any complications. She looks great and I am delighted at how she is doing. I will be happy to see her again whenever needed in the future Matt B. Hassell Done, MD, [facility] [phone] beeper [phone]  12/03/2011 12:10 PM

## 2011-12-14 DIAGNOSIS — Z1331 Encounter for screening for depression: Secondary | ICD-10-CM | POA: Diagnosis not present

## 2011-12-14 DIAGNOSIS — Z0000 Encounter for general adult medical examination without abnormal findings: Secondary | ICD-10-CM | POA: Diagnosis not present

## 2011-12-26 DIAGNOSIS — J209 Acute bronchitis, unspecified: Secondary | ICD-10-CM | POA: Diagnosis not present

## 2011-12-31 DIAGNOSIS — J209 Acute bronchitis, unspecified: Secondary | ICD-10-CM | POA: Diagnosis not present

## 2012-02-23 ENCOUNTER — Emergency Department (HOSPITAL_COMMUNITY)
Admission: EM | Admit: 2012-02-23 | Discharge: 2012-02-23 | Disposition: A | Discharge status: Home / Self Care | Payer: Medicare Other | Attending: Emergency Medicine | Admitting: Emergency Medicine

## 2012-02-23 ENCOUNTER — Other Ambulatory Visit: Payer: Self-pay

## 2012-02-23 ENCOUNTER — Emergency Department (HOSPITAL_COMMUNITY): Payer: Medicare Other

## 2012-02-23 DIAGNOSIS — R072 Precordial pain: Secondary | ICD-10-CM | POA: Diagnosis not present

## 2012-02-23 DIAGNOSIS — I4891 Unspecified atrial fibrillation: Secondary | ICD-10-CM | POA: Diagnosis not present

## 2012-02-23 DIAGNOSIS — R079 Chest pain, unspecified: Secondary | ICD-10-CM | POA: Insufficient documentation

## 2012-02-23 DIAGNOSIS — K219 Gastro-esophageal reflux disease without esophagitis: Secondary | ICD-10-CM | POA: Diagnosis not present

## 2012-02-23 DIAGNOSIS — R0602 Shortness of breath: Secondary | ICD-10-CM | POA: Diagnosis not present

## 2012-02-23 DIAGNOSIS — I440 Atrioventricular block, first degree: Secondary | ICD-10-CM | POA: Insufficient documentation

## 2012-02-23 DIAGNOSIS — I10 Essential (primary) hypertension: Secondary | ICD-10-CM | POA: Diagnosis not present

## 2012-02-23 DIAGNOSIS — R61 Generalized hyperhidrosis: Secondary | ICD-10-CM | POA: Diagnosis not present

## 2012-02-23 LAB — DIFFERENTIAL
Basophils Absolute: 0.1 10*3/uL (ref 0.0–0.1)
Lymphocytes Relative: 22 % (ref 12–46)
Lymphs Abs: 1.8 10*3/uL (ref 0.7–4.0)
Neutro Abs: 5.2 10*3/uL (ref 1.7–7.7)
Neutrophils Relative %: 65 % (ref 43–77)

## 2012-02-23 LAB — URINALYSIS, ROUTINE W REFLEX MICROSCOPIC
Glucose, UA: NEGATIVE mg/dL
Hgb urine dipstick: NEGATIVE
Leukocytes, UA: NEGATIVE
Protein, ur: NEGATIVE mg/dL
Specific Gravity, Urine: 1.014 (ref 1.005–1.030)
Urobilinogen, UA: 0.2 mg/dL (ref 0.0–1.0)

## 2012-02-23 LAB — POCT I-STAT TROPONIN I
Troponin i, poc: 0 ng/mL (ref 0.00–0.08)
Troponin i, poc: 0.01 ng/mL (ref 0.00–0.08)

## 2012-02-23 LAB — BASIC METABOLIC PANEL
CO2: 28 mEq/L (ref 19–32)
Calcium: 9.1 mg/dL (ref 8.4–10.5)
Chloride: 99 mEq/L (ref 96–112)
Glucose, Bld: 94 mg/dL (ref 70–99)
Potassium: 4.6 mEq/L (ref 3.5–5.1)
Sodium: 136 mEq/L (ref 135–145)

## 2012-02-23 LAB — CBC
Platelets: 316 10*3/uL (ref 150–400)
RBC: 3.6 MIL/uL — ABNORMAL LOW (ref 3.87–5.11)
RDW: 13.3 % (ref 11.5–15.5)
WBC: 7.9 10*3/uL (ref 4.0–10.5)

## 2012-02-23 MED ORDER — IBUPROFEN 200 MG PO TABS
400.0000 mg | ORAL_TABLET | Freq: Once | ORAL | Status: AC
  Administered 2012-02-23: 400 mg via ORAL
  Filled 2012-02-23: qty 2

## 2012-02-23 MED ORDER — OMEPRAZOLE 20 MG PO CPDR: DELAYED_RELEASE_CAPSULE | ORAL | Status: DC

## 2012-02-23 NOTE — ED Provider Notes (Signed)
History     CSN: [id number]  Arrival date & time 02/23/12  1054   First MD Initiated Contact with Patient 02/23/12 1143      No chief complaint on file.   (Consider location/radiation/quality/duration/timing/severity/associated sxs/prior treatment) HPI  Patient presents to emergency department complaining of acute onset chest pain. Patient states she woke from sleep in the middle night feeling a stabbing substernal chest pain. She states she went back to sleep but when she woke this morning she began to have chest pain once again. Patient states pain is intermittent, stabbing, and very fleeting. Patient states the pain will last for a few seconds and go away. Patient states the pain happens every few minutes. Once again patient states that a stabbing fleeting pain. She denies any associated nausea, shortness of breath, abdominal pain, fevers, chills, cough, or hemoptysis. Patient states she took 4 aspirin today prior to arrival. Denies aggravating or alleviating factors. Patient states she is followed by Dr. Fransico Him, cardiology, for history of atrial fib and remote history of cardiac arrest secondary to V. fib. She denies any recent illness.  Past Medical History  Diagnosis Date  . Arthritis   . Heart murmur   . GERD (gastroesophageal reflux disease)   . Dysrhythmia 2006    Hx A-Fib , Hx V-Fib caused cardiac arrest  . Hypertension     Past Surgical History  Procedure Date  . Eye surgery 06/10/11    membrane peel   . Rotator cuff repair   . Nasal sinus surgery   . Wisdom tooth extraction   . Cardiac catheterization   . Cholecystectomy 11/10/2011    Procedure: LAPAROSCOPIC CHOLECYSTECTOMY WITH INTRAOPERATIVE CHOLANGIOGRAM;  Surgeon: Pedro Earls, MD;  Location: [facility];  Service: General;  Laterality: N/A;  . Umbilical hernia repair [id number]    Procedure: HERNIA REPAIR UMBILICAL ADULT;  Surgeon: Pedro Earls, MD;  Location: [facility];  Service: General;  Laterality: N/A;     Family History  Problem Relation Age of Onset  . Cancer Mother     History  Substance Use Topics  . Smoking status: Never Smoker   . Smokeless tobacco: Never Used  . Alcohol Use: 0.6 oz/week    1 Glasses of wine per week     occassionally    OB History    Grav Para Term Preterm Abortions TAB SAB Ect Mult Living                  Review of Systems  All other systems reviewed and are negative.    Allergies  Amlodipine besylate; Avelox; Duragesic disc transdermal system; Lidocaine; Morphine and related; and Teriparatide (recombinant)  Home Medications   Current Outpatient Rx  Name Route Sig Dispense Refill  . ACETAMINOPHEN 500 MG PO TABS Oral Take 500 mg by mouth every 6 (six) hours as needed. Pain     . AMLODIPINE BESYLATE 2.5 MG PO TABS Oral Take 2.5 mg by mouth daily after breakfast.     . ASPIRIN EC 81 MG PO TBEC Oral Take 81 mg by mouth at bedtime.     . AZELASTINE HCL 137 MCG/SPRAY NA SOLN Nasal Place 2 sprays into the nose 2 (two) times daily.     . BUPROPION HCL ER (XL) 150 MG PO TB24 Oral Take 150 mg by mouth 2 (two) times daily.     Marland Kitchen CALCITONIN (SALMON) 200 UNIT/ACT NA SOLN Nasal Place 1 spray into the nose daily.     Marland Kitchen CALCIUM +  D PO Oral Take 1 tablet by mouth 2 (two) times daily.     Marland Kitchen CARVEDILOL 25 MG PO TABS Oral Take 25 mg by mouth 2 (two) times daily with a meal.     . CETIRIZINE HCL 10 MG PO TABS Oral Take 10 mg by mouth daily.     Marland Kitchen CLONIDINE HCL 0.1 MG PO TABS Oral Take 0.1 mg by mouth 2 (two) times daily.     . DEMECLOCYCLINE HCL 150 MG PO TABS Oral Take 150 mg by mouth 2 (two) times daily.     . DESONIDE 0.05 % EX OINT Topical Apply 1 application topically 2 (two) times daily.     Marland Kitchen DICLOFENAC SODIUM 1 % TD GEL Topical Apply 1 application topically 4 (four) times daily as needed. Pain     . OMEGA-3 FATTY ACIDS 1000 MG PO CAPS Oral Take 1 g by mouth daily.     . FUROSEMIDE 20 MG PO TABS Oral Take 20 mg by mouth daily after breakfast.     .  METHOCARBAMOL 500 MG PO TABS Oral Take 500 mg by mouth 3 (three) times daily as needed. Muscle spasm     . CENTRUM CARDIO PO Oral Take 1 tablet by mouth 2 (two) times daily.     Marland Kitchen OMEPRAZOLE 20 MG PO CPDR Oral Take 20 mg by mouth daily.     Marland Kitchen PREGABALIN 50 MG PO CAPS Oral Take 50 mg by mouth daily.     Marland Kitchen RAMIPRIL 10 MG PO CAPS Oral Take 20 mg by mouth daily after breakfast.     . TRAMADOL HCL ER 300 MG PO TB24 Oral Take 300 mg by mouth daily as needed. Pain       BP 159/60  Pulse 60  Temp(Src) 98.4 F (36.9 C) (Oral)  Resp 16  SpO2 98%  Physical Exam  Nursing note and vitals reviewed. Constitutional: She is oriented to person, place, and time. She appears well-developed and well-nourished. No distress.  HENT:  Head: Normocephalic and atraumatic.  Eyes: Conjunctivae are normal.  Neck: Normal range of motion. Neck supple.  Cardiovascular: Normal rate, regular rhythm, normal heart sounds and intact distal pulses.  Exam reveals no gallop and no friction rub.   No murmur heard. Pulmonary/Chest: Effort normal and breath sounds normal. No respiratory distress. She has no wheezes. She has no rales. She exhibits tenderness.       Mild TTP of entire anterior chest wall but no skin changes or crepitous.   Abdominal: Soft. Bowel sounds are normal. She exhibits no distension and no mass. There is no tenderness. There is no rebound and no guarding.  Musculoskeletal: Normal range of motion. She exhibits no edema and no tenderness.  Neurological: She is alert and oriented to person, place, and time.  Skin: Skin is warm and dry. No rash noted. She is not diaphoretic. No erythema.  Psychiatric: She has a normal mood and affect.    ED Course  Procedures (including critical care time)   Date: 02/23/2012  Rate: 56  Rhythm: normal sinus rhythm  QRS Axis: left  Intervals: normal  ST/T Wave abnormalities: normal  Conduction Disutrbances:first-degree A-V block   Narrative Interpretation: non  provocative EKG compared to Nov 03, 2011  Old EKG Reviewed: unchanged  2:46 PM I spoke with Dr Radford Pax who states that if second troponin is negative then she would like patient to be d/c home with increased omeprazole to BID as well as taking motrin 400mg  TID  and then follow up closely in office this week for recheck of ongoing symptoms. She would like strict precautions given for worsening pain to return to ER.   Labs Reviewed  CBC - Abnormal; Notable for the following:    RBC 3.60 (*)    Hemoglobin 11.0 (*)    HCT 33.6 (*)    All other components within normal limits  BASIC METABOLIC PANEL - Abnormal; Notable for the following:    BUN 24 (*)    GFR calc non Af Amer 49 (*)    GFR calc Af Amer 56 (*)    All other components within normal limits  URINALYSIS, ROUTINE W REFLEX MICROSCOPIC  DIFFERENTIAL  POCT I-STAT TROPONIN I  POCT I-STAT TROPONIN I   Dg Chest 2 View  02/23/2012  *RADIOLOGY REPORT*  Clinical Data: Short of breath.  Diaphoresis.  Chest pain.  CHEST - 2 VIEW  Comparison: 11/03/2011.  Findings: Diffuse nonspecific interstitial prominence is present. Patchy density is present over the left hemidiaphragm which may represent airspace disease or atelectasis.  Cardiopericardial silhouette is within normal limits.  Tortuous calcified thoracic aorta.  Multilevel vertebral augmentation identified.  Right lung appears clear.  No pleural effusion.  High-riding humeral head suggest bilateral chronic rotator cuff tears.  IMPRESSION:  1.  Left basilar density which may represent airspace disease or atelectasis.  Differential considerations for airspace disease include pneumonia or aspiration pneumonitis. 2.  Tortuous calcified thoracic aorta.  Original Report Authenticated By: Dereck Ligas, M.D.     1. Chest pain     Patient is not complaining of cough, fevers, and does not have white count. Favor atelectasis over CAP. Atypical chest pain with two negative troponins in ER and non  provocative EKG.   MDM  VSS. Patient to follow up with Dr. Radford Pax with recommendations listed above. Non toxic appearing with typical chest pain that is not pleuritic and no other worrisome features for PE.         Eben Burow, [place] 02/23/12 [phone]

## 2012-02-23 NOTE — Discharge Instructions (Signed)
Continue taking your nightly aspirin. Increase your omeprazole to one tablet daily. For the next 5 days, take 400 mg of ibuprofen at breakfast, lunch and dinner. Call Dr. Theodosia Blender office tomorrow to schedule close follow up in the next few days to further discuss your chest pain but return to ER for changing or worsening of symptoms.   Chest Pain (Nonspecific) It is often hard to give a specific diagnosis for the cause of chest pain. There is always a chance that your pain could be related to something serious, such as a heart attack or a blood clot in the lungs. You need to follow up with your caregiver for further evaluation. CAUSES   Heartburn.   Pneumonia or bronchitis.   Anxiety or stress.   Inflammation around your heart (pericarditis) or lung (pleuritis or pleurisy).   A blood clot in the lung.   A collapsed lung (pneumothorax). It can develop suddenly on its own (spontaneous pneumothorax) or from injury (trauma) to the chest.   Shingles infection (herpes zoster virus).  The chest wall is composed of bones, muscles, and cartilage. Any of these can be the source of the pain.  The bones can be bruised by injury.   The muscles or cartilage can be strained by coughing or overwork.   The cartilage can be affected by inflammation and become sore (costochondritis).  DIAGNOSIS  Lab tests or other studies, such as X-rays, electrocardiography, stress testing, or cardiac imaging, may be needed to find the cause of your pain.  TREATMENT   Treatment depends on what may be causing your chest pain. Treatment may include:   Acid blockers for heartburn.   Anti-inflammatory medicine.   Pain medicine for inflammatory conditions.   Antibiotics if an infection is present.   You may be advised to change lifestyle habits. This includes stopping smoking and avoiding alcohol, caffeine, and chocolate.   You may be advised to keep your head raised (elevated) when sleeping. This reduces the  chance of acid going backward from your stomach into your esophagus.   Most of the time, nonspecific chest pain will improve within 2 to 3 days with rest and mild pain medicine.  HOME CARE INSTRUCTIONS   If antibiotics were prescribed, take your antibiotics as directed. Finish them even if you start to feel better.   For the next few days, avoid physical activities that bring on chest pain. Continue physical activities as directed.   Do not smoke.   Avoid drinking alcohol.   Only take over-the-counter or prescription medicine for pain, discomfort, or fever as directed by your caregiver.   Follow your caregiver's suggestions for further testing if your chest pain does not go away.   Keep any follow-up appointments you made. If you do not go to an appointment, you could develop lasting (chronic) problems with pain. If there is any problem keeping an appointment, you must call to reschedule.  SEEK MEDICAL CARE IF:   You think you are having problems from the medicine you are taking. Read your medicine instructions carefully.   Your chest pain does not go away, even after treatment.   You develop a rash with blisters on your chest.  SEEK IMMEDIATE MEDICAL CARE IF:   You have increased chest pain or pain that spreads to your arm, neck, jaw, back, or abdomen.   You develop shortness of breath, an increasing cough, or you are coughing up blood.   You have severe back or abdominal pain, feel nauseous, or vomit.  You develop severe weakness, fainting, or chills.   You have a fever.  THIS IS AN EMERGENCY. Do not wait to see if the pain will go away. Get medical help at once. Call your local emergency services (911 in [place] Do not drive yourself to the hospital. MAKE SURE YOU:   Understand these instructions.   Will watch your condition.   Will get help right away if you are not doing well or get worse.  Document Released: 07/15/2005 Document Revised: 09/24/2011 Document Reviewed:  05/10/2008 [facility] Patient Information 2012 [place].

## 2012-02-23 NOTE — ED Provider Notes (Signed)
Medical screening examination/treatment/procedure(s) were conducted as a shared visit with non-physician practitioner(s) and myself.  I personally evaluated the patient during the encounter  Orlie Dakin, MD 02/23/12 1714

## 2012-02-23 NOTE — ED Notes (Signed)
Assist pt. to the bathroom.

## 2012-02-23 NOTE — ED Provider Notes (Signed)
complains of anterior chest pain at subxiphoid area several episodes lasting a split second onset 11 PM last night no other associated symptoms no shortness of breath no nausea sweatiness on exam patient is a alert nontoxic lungs clear to auscultation heart regular rate and rhythm abdomen nondistended nontender. Patient had an episode of chest discomfort while I examine her which lasted approximately one second. She remained in sinus rhythm on the cardiac monitor during the episode. Pain felt to be nonspecific   Orlie Dakin, MD 02/23/12 1458

## 2012-02-23 NOTE — ED Notes (Signed)
Pt reports she was awakened with pain in mid chest last night. She denies n,v, dizziness, sob, diaphoresis. She went back to sleep and noticed that pain was still present. Has hx of "takosubo" swelling of heart which caused cardiac arrest in past. Pt voices that this does not feel the same.

## 2012-03-03 DIAGNOSIS — Z79899 Other long term (current) drug therapy: Secondary | ICD-10-CM | POA: Diagnosis not present

## 2012-03-03 DIAGNOSIS — I5181 Takotsubo syndrome: Secondary | ICD-10-CM | POA: Diagnosis not present

## 2012-03-03 DIAGNOSIS — R079 Chest pain, unspecified: Secondary | ICD-10-CM | POA: Diagnosis not present

## 2012-03-03 DIAGNOSIS — I4891 Unspecified atrial fibrillation: Secondary | ICD-10-CM | POA: Diagnosis not present

## 2012-03-03 DIAGNOSIS — I119 Hypertensive heart disease without heart failure: Secondary | ICD-10-CM | POA: Diagnosis not present

## 2012-03-03 DIAGNOSIS — I428 Other cardiomyopathies: Secondary | ICD-10-CM | POA: Diagnosis not present

## 2012-03-04 DIAGNOSIS — Z79899 Other long term (current) drug therapy: Secondary | ICD-10-CM | POA: Diagnosis not present

## 2012-03-25 DIAGNOSIS — L259 Unspecified contact dermatitis, unspecified cause: Secondary | ICD-10-CM | POA: Diagnosis not present

## 2012-04-11 DIAGNOSIS — M549 Dorsalgia, unspecified: Secondary | ICD-10-CM | POA: Diagnosis not present

## 2012-04-11 DIAGNOSIS — I129 Hypertensive chronic kidney disease with stage 1 through stage 4 chronic kidney disease, or unspecified chronic kidney disease: Secondary | ICD-10-CM | POA: Diagnosis not present

## 2012-04-11 DIAGNOSIS — L509 Urticaria, unspecified: Secondary | ICD-10-CM | POA: Diagnosis not present

## 2012-08-03 ENCOUNTER — Ambulatory Visit (INDEPENDENT_AMBULATORY_CARE_PROVIDER_SITE_OTHER): Payer: Medicare Other | Admitting: Surgery

## 2012-08-03 ENCOUNTER — Encounter (INDEPENDENT_AMBULATORY_CARE_PROVIDER_SITE_OTHER): Payer: Self-pay | Admitting: Surgery

## 2012-08-03 VITALS — BP 140/62 | HR 64 | Temp 97.6°F | Resp 16 | Ht 63.0 in | Wt 125.6 lb

## 2012-08-03 DIAGNOSIS — K429 Umbilical hernia without obstruction or gangrene: Secondary | ICD-10-CM

## 2012-08-03 NOTE — Patient Instructions (Signed)
Thanks for your patience.  If you need further assistance after leaving the office, please call our office and speak with a [facility] nurse.  [phone].  If you want to leave a message for Dr. Derrick Orris, please call his office phone at [phone]. 

## 2012-08-03 NOTE — Progress Notes (Signed)
[name] 78 y.o.  Body mass index is 22.25 kg/(m^2).  Patient Active Problem List  Diagnosis  . Polymyalgia rheumatica  . Essential hypertension, benign  . Esophageal reflux  . S/P cholecystectomy    Allergies  Allergen Reactions  . Amlodipine Besylate     Swelling feet ankles-pt. States is not aware of this allergy!  . Avelox (Moxifloxacin Hcl In Nacl) Other (See Comments)    Pt does not remember   . Duragesic Disc Transdermal System (Alcohol-Fentanyl)   . Lidocaine     Rash    . Morphine And Related     "My Sister and daughter can,t take it. I've had it before without problems."  . Teriparatide (Recombinant) Other (See Comments)    Made skin dry    Past Surgical History  Procedure Date  . Eye surgery 06/10/11    membrane peel   . Rotator cuff repair   . Nasal sinus surgery   . Wisdom tooth extraction   . Cardiac catheterization   . Cholecystectomy 11/10/2011    Procedure: LAPAROSCOPIC CHOLECYSTECTOMY WITH INTRAOPERATIVE CHOLANGIOGRAM;  Surgeon: Pedro Earls, MD;  Location: [facility];  Service: General;  Laterality: N/A;  . Umbilical hernia repair [id number]    Procedure: HERNIA REPAIR UMBILICAL ADULT;  Surgeon: Pedro Earls, MD;  Location: [facility];  Service: General;  Laterality: N/A;   Mathews Argyle, MD No diagnosis found.   Has developed a small umbilical hernia at site that I primarily closed a large umbilical hernia when I did her lap chole.  Plan to observe. She remembered Claris Pong friend and old patient of mine who is deceased. Return PRN Matt B. Hassell Done, MD, [facility] [phone] beeper [phone]  08/03/2012 3:16 PM

## 2012-08-05 DIAGNOSIS — M545 Low back pain: Secondary | ICD-10-CM | POA: Diagnosis not present

## 2012-08-05 DIAGNOSIS — M25579 Pain in unspecified ankle and joints of unspecified foot: Secondary | ICD-10-CM | POA: Diagnosis not present

## 2012-08-05 DIAGNOSIS — M25519 Pain in unspecified shoulder: Secondary | ICD-10-CM | POA: Diagnosis not present

## 2012-08-19 DIAGNOSIS — Z23 Encounter for immunization: Secondary | ICD-10-CM | POA: Diagnosis not present

## 2012-08-25 ENCOUNTER — Other Ambulatory Visit (HOSPITAL_COMMUNITY): Payer: Self-pay | Admitting: Geriatric Medicine

## 2012-08-25 DIAGNOSIS — Z1231 Encounter for screening mammogram for malignant neoplasm of breast: Secondary | ICD-10-CM

## 2012-09-08 DIAGNOSIS — I428 Other cardiomyopathies: Secondary | ICD-10-CM | POA: Diagnosis not present

## 2012-09-08 DIAGNOSIS — I4891 Unspecified atrial fibrillation: Secondary | ICD-10-CM | POA: Diagnosis not present

## 2012-09-08 DIAGNOSIS — R011 Cardiac murmur, unspecified: Secondary | ICD-10-CM | POA: Diagnosis not present

## 2012-09-08 DIAGNOSIS — I119 Hypertensive heart disease without heart failure: Secondary | ICD-10-CM | POA: Diagnosis not present

## 2012-09-19 ENCOUNTER — Ambulatory Visit (HOSPITAL_COMMUNITY): Payer: Medicare Other

## 2012-09-20 DIAGNOSIS — R011 Cardiac murmur, unspecified: Secondary | ICD-10-CM | POA: Diagnosis not present

## 2012-09-20 DIAGNOSIS — I4891 Unspecified atrial fibrillation: Secondary | ICD-10-CM | POA: Diagnosis not present

## 2012-09-20 DIAGNOSIS — I119 Hypertensive heart disease without heart failure: Secondary | ICD-10-CM | POA: Diagnosis not present

## 2012-09-20 DIAGNOSIS — I428 Other cardiomyopathies: Secondary | ICD-10-CM | POA: Diagnosis not present

## 2012-09-26 ENCOUNTER — Ambulatory Visit (HOSPITAL_COMMUNITY)
Admission: RE | Admit: 2012-09-26 | Discharge: 2012-09-26 | Disposition: A | Discharge status: Home / Self Care | Payer: Medicare Other | Source: Ambulatory Visit | Attending: Geriatric Medicine | Admitting: Geriatric Medicine

## 2012-09-26 DIAGNOSIS — Z1231 Encounter for screening mammogram for malignant neoplasm of breast: Secondary | ICD-10-CM | POA: Insufficient documentation

## 2013-03-16 DIAGNOSIS — I119 Hypertensive heart disease without heart failure: Secondary | ICD-10-CM | POA: Diagnosis not present

## 2013-03-16 DIAGNOSIS — I4891 Unspecified atrial fibrillation: Secondary | ICD-10-CM | POA: Diagnosis not present

## 2013-03-16 DIAGNOSIS — I428 Other cardiomyopathies: Secondary | ICD-10-CM | POA: Diagnosis not present

## 2013-06-16 DIAGNOSIS — M48061 Spinal stenosis, lumbar region without neurogenic claudication: Secondary | ICD-10-CM | POA: Diagnosis not present

## 2013-06-16 DIAGNOSIS — G894 Chronic pain syndrome: Secondary | ICD-10-CM | POA: Diagnosis not present

## 2013-06-16 DIAGNOSIS — M545 Low back pain: Secondary | ICD-10-CM | POA: Diagnosis not present

## 2013-06-16 DIAGNOSIS — M79609 Pain in unspecified limb: Secondary | ICD-10-CM | POA: Diagnosis not present

## 2013-07-13 DIAGNOSIS — Z23 Encounter for immunization: Secondary | ICD-10-CM | POA: Diagnosis not present

## 2013-08-16 ENCOUNTER — Other Ambulatory Visit: Payer: Self-pay | Admitting: Cardiology

## 2013-09-06 ENCOUNTER — Encounter: Payer: Self-pay | Admitting: Cardiology

## 2013-09-07 ENCOUNTER — Encounter: Payer: Self-pay | Admitting: Cardiology

## 2013-09-07 ENCOUNTER — Ambulatory Visit (INDEPENDENT_AMBULATORY_CARE_PROVIDER_SITE_OTHER): Payer: Medicare Other | Admitting: Cardiology

## 2013-09-07 VITALS — BP 156/70 | HR 58 | Ht 63.0 in | Wt 124.0 lb

## 2013-09-07 DIAGNOSIS — I10 Essential (primary) hypertension: Secondary | ICD-10-CM | POA: Insufficient documentation

## 2013-09-07 DIAGNOSIS — M549 Dorsalgia, unspecified: Secondary | ICD-10-CM | POA: Insufficient documentation

## 2013-09-07 DIAGNOSIS — E785 Hyperlipidemia, unspecified: Secondary | ICD-10-CM

## 2013-09-07 DIAGNOSIS — I4891 Unspecified atrial fibrillation: Secondary | ICD-10-CM | POA: Diagnosis not present

## 2013-09-07 DIAGNOSIS — I5181 Takotsubo syndrome: Secondary | ICD-10-CM

## 2013-09-07 DIAGNOSIS — I480 Paroxysmal atrial fibrillation: Secondary | ICD-10-CM

## 2013-09-07 DIAGNOSIS — K922 Gastrointestinal hemorrhage, unspecified: Secondary | ICD-10-CM | POA: Insufficient documentation

## 2013-09-07 DIAGNOSIS — I472 Ventricular tachycardia: Secondary | ICD-10-CM | POA: Insufficient documentation

## 2013-09-07 DIAGNOSIS — I252 Old myocardial infarction: Secondary | ICD-10-CM | POA: Insufficient documentation

## 2013-09-07 DIAGNOSIS — E222 Syndrome of inappropriate secretion of antidiuretic hormone: Secondary | ICD-10-CM | POA: Insufficient documentation

## 2013-09-07 DIAGNOSIS — I4819 Other persistent atrial fibrillation: Secondary | ICD-10-CM | POA: Insufficient documentation

## 2013-09-07 LAB — BASIC METABOLIC PANEL
CO2: 24 mEq/L (ref 19–32)
Calcium: 9.3 mg/dL (ref 8.4–10.5)
Creatinine, Ser: 1.5 mg/dL — ABNORMAL HIGH (ref 0.4–1.2)
GFR: 35.06 mL/min — ABNORMAL LOW (ref >60.00)
Sodium: 136 mEq/L (ref 135–145)

## 2013-09-07 MED ORDER — FUROSEMIDE 20 MG PO TABS: 20.0000 mg | ORAL_TABLET | Freq: Every day | ORAL | Status: DC

## 2013-09-07 MED ORDER — AMLODIPINE BESYLATE 2.5 MG PO TABS: 2.5000 mg | ORAL_TABLET | Freq: Every day | ORAL | Status: DC

## 2013-09-07 NOTE — Progress Notes (Addendum)
[place], [address], [place]  [address] Phone: [phone] Fax:  [phone]  Date:  09/07/2013   ID:  [name], DOB [birth year], MRN [id number]  PCP:  Mathews Argyle, MD  Cardiologist:  Fransico Him, MD     History of Present Illness: [name] is a 79 y.o. female with a history of stress induced MI, Takotubo DM with normalized EF, , hypertensive heart disease , PAF who presents today for followup.  She denies any chest pain, SOB, DOE, LE edema, dizziness, palpitations or syncope.     Wt Readings from Last 3 Encounters:  09/07/13 124 lb (56.246 kg)  08/03/12 125 lb 9.6 oz (56.972 kg)  11/10/11 129 lb 8 oz (58.741 kg)     Past Medical History  Diagnosis Date  . Arthritis   . Heart murmur   . GERD (gastroesophageal reflux disease)   . Takotsubo cardiomyopathy     EF normalized to 70%  . Old MI (myocardial infarction)   . Back pain   . SIADH (syndrome of inappropriate ADH production)   . Hypertension   . PMR (polymyalgia rheumatica)   . GI bleed due to NSAIDs   . Depression   . Osteoporosis   . Hyperlipidemia   . CKD (chronic kidney disease) stage 3, GFR 30-59 ml/min   . PAF (paroxysmal atrial fibrillation) 2006    not on anticoagulation due to GI bleed  . Ventricular tachycardia     on initial presentation of MI    Current Outpatient Prescriptions  Medication Sig Dispense Refill  . AF-IBUPROFEN PO Take by mouth.      . Albuterol (VENTOLIN IN) Inhale into the lungs.      Marland Kitchen amLODipine (NORVASC) 2.5 MG tablet Take 1 tablet (2.5 mg total) by mouth daily after breakfast.  90 tablet  3  . aspirin EC 81 MG tablet Take 81 mg by mouth at bedtime.       Marland Kitchen azelastine (ASTELIN) 137 MCG/SPRAY nasal spray Place 2 sprays into the nose 2 (two) times daily.       Marland Kitchen buPROPion (WELLBUTRIN XL) 150 MG 24 hr tablet Take 150 mg by mouth 2 (two) times daily.       . Calcium Carbonate-Vitamin D (CALCIUM + D PO) Take 1 tablet by mouth 2  (two) times daily.       . carvedilol (COREG) 25 MG tablet TAKE 1 TABLET TWICE A DAY WITH FOOD  180 tablet  1  . cetirizine (ZYRTEC) 10 MG tablet Take 10 mg by mouth daily.       . cloNIDine (CATAPRES) 0.1 MG tablet Take 0.1 mg by mouth 2 (two) times daily.       Marland Kitchen demeclocycline (DECLOMYCIN) 150 MG tablet Take 150 mg by mouth 2 (two) times daily.       . diclofenac sodium (VOLTAREN) 1 % GEL Apply 1 application topically 4 (four) times daily as needed. Pain       . fish oil-omega-3 fatty acids 1000 MG capsule Take 1 g by mouth daily.       . furosemide (LASIX) 20 MG tablet Take 1 tablet (20 mg total) by mouth daily after breakfast.  90 tablet  3  . methocarbamol (ROBAXIN) 500 MG tablet Take 500 mg by mouth 3 (three) times daily as needed. Muscle spasm       . Multiple Vitamins-Minerals (CENTRUM CARDIO PO) Take 1 tablet by mouth 2 (two) times daily.       Marland Kitchen  omeprazole (PRILOSEC) 20 MG capsule One capsule by mouth BID.  40 capsule  0  . pregabalin (LYRICA) 50 MG capsule Take 50 mg by mouth daily.       . ramipril (ALTACE) 10 MG capsule Take 20 mg by mouth daily after breakfast.       . traMADol (ULTRAM-ER) 300 MG 24 hr tablet Take 300 mg by mouth daily as needed. Pain        No current facility-administered medications for this visit.    Allergies:    Allergies  Allergen Reactions  . Amlodipine Besylate     Swelling feet ankles-pt. States is not aware of this allergy!  . Avelox [Moxifloxacin Hcl In Nacl] Other (See Comments)    Pt does not remember   . Duragesic Disc Transdermal System [Alcohol-Fentanyl]   . Lidocaine     Rash    . Morphine And Related     "My Sister and daughter can,t take it. I've had it before without problems."  . Teriparatide (Recombinant) Other (See Comments)    Made skin dry    Social History:  The patient  reports that she has never smoked. She has never used smokeless tobacco. She reports that she does not drink alcohol or use illicit drugs.   Family  History:  The patient's family history includes Cancer in her mother.   ROS:  Please see the history of present illness.      All other systems reviewed and negative.   PHYSICAL EXAM: VS:  BP 156/70  Pulse 58  Ht 5\' 3"  (1.6 m)  Wt 124 lb (56.246 kg)  BMI 21.97 kg/m2  SpO2 91% Well nourished, well developed, in no acute distress HEENT: normal Neck: no JVD Cardiac:  normal S1, S2; RRR; 2/6 SM at LLSB to apex Lungs:  clear to auscultation bilaterally, no wheezing, rhonchi or rales Abd: soft, nontender, no hepatomegaly Ext: no edema Skin: warm and dry Neuro:  CNs 2-12 intact, no focal abnormalities noted  EKG:    NSR with no ST changes  ASSESSMENT AND PLAN:  1. Takosubo CM with noramlized EF at 70%  - continue Carvedilol/ACE I  - continue Lasix  - check BMET 2. Hypertensive heart disease - BP mildly elevated  - continue Carvedilol/amlodipine/clonidine/altace  - I have asked her to check her BP daily for a week and call with results 3. PAF maintaining NSR on medical therapy - not on anticoagulation due to history of GI bleed  - continue Carvedilol  Followup with me in 6 months Signed, Fransico Him, MD 09/07/2013 9:48 PM

## 2013-09-07 NOTE — Patient Instructions (Signed)
Your  90 day supply with 3 refills for your lasix and amlodipine has been sent in  Your physician recommends that you go to the lab today and have a BMET drawn  Your physician wants you to follow-up in: 6 Months with Dr. Mallie Snooks will receive a reminder letter in the mail two months in advance. If you don't receive a letter, please call our office to schedule the follow-up appointment.

## 2013-09-11 ENCOUNTER — Other Ambulatory Visit: Payer: Self-pay | Admitting: General Surgery

## 2013-09-11 DIAGNOSIS — Z79899 Other long term (current) drug therapy: Secondary | ICD-10-CM

## 2013-09-11 MED ORDER — FUROSEMIDE 20 MG PO TABS: 20.0000 mg | ORAL_TABLET | ORAL | Status: DC

## 2013-09-18 ENCOUNTER — Other Ambulatory Visit (INDEPENDENT_AMBULATORY_CARE_PROVIDER_SITE_OTHER): Payer: Medicare Other

## 2013-09-18 ENCOUNTER — Telehealth: Payer: Self-pay | Admitting: General Surgery

## 2013-09-18 DIAGNOSIS — Z79899 Other long term (current) drug therapy: Secondary | ICD-10-CM

## 2013-09-18 LAB — BASIC METABOLIC PANEL
BUN: 43 mg/dL — ABNORMAL HIGH (ref 6–23)
CO2: 25 mEq/L (ref 19–32)
Calcium: 9 mg/dL (ref 8.4–10.5)
Chloride: 107 mEq/L (ref 96–112)
Creatinine, Ser: 1.6 mg/dL — ABNORMAL HIGH (ref 0.4–1.2)
GFR: 33 mL/min — ABNORMAL LOW (ref >60.00)
Glucose, Bld: 117 mg/dL — ABNORMAL HIGH (ref 70–99)
Potassium: 5.3 mEq/L — ABNORMAL HIGH (ref 3.5–5.1)
Sodium: 139 mEq/L (ref 135–145)

## 2013-09-18 NOTE — Telephone Encounter (Signed)
Bp readings are within normal limits.  Please continue current medications.

## 2013-09-18 NOTE — Telephone Encounter (Signed)
LVM for pt to return call

## 2013-09-18 NOTE — Telephone Encounter (Signed)
BP Readings  09-08-13 136/51 09-09-13 122/54 09-10-13 128/56 09-11-13 114/52 09-12-13 107/44 09-13-13 116/51 09-14-13 129/61

## 2013-09-19 ENCOUNTER — Telehealth: Payer: Self-pay | Admitting: Cardiology

## 2013-09-19 ENCOUNTER — Other Ambulatory Visit: Payer: Self-pay | Admitting: *Deleted

## 2013-09-19 DIAGNOSIS — E875 Hyperkalemia: Secondary | ICD-10-CM

## 2013-09-19 NOTE — Telephone Encounter (Signed)
New Problem:  Pt states she is returning Danielle's call. Pt would like to see if another nurse could call her back since Andee Poles is off.

## 2013-09-19 NOTE — Telephone Encounter (Signed)
Emmaline Life, RN at 09/19/2013 9:03 AM    Status: Signed       Spoke with patient and reported Dr. Theodosia Blender advice regarding BP readings that patient called in on 12/1. Patient verbalized understanding and expressed gratitude.       Vallarie Mare at 09/19/2013 8:55 AM     Status: Signed        New Problem:  Pt states she is returning Danielle's call. Pt would like to see if another nurse could call her back since Andee Poles is off.

## 2013-09-19 NOTE — Telephone Encounter (Signed)
Spoke with patient and reported Dr. Theodosia Blender advice regarding BP readings that patient called in on 12/1.  Patient verbalized understanding and expressed gratitude.

## 2013-09-26 ENCOUNTER — Other Ambulatory Visit (INDEPENDENT_AMBULATORY_CARE_PROVIDER_SITE_OTHER): Payer: Medicare Other

## 2013-09-26 DIAGNOSIS — E875 Hyperkalemia: Secondary | ICD-10-CM | POA: Diagnosis not present

## 2013-09-26 LAB — BASIC METABOLIC PANEL
CO2: 24 mEq/L (ref 19–32)
Calcium: 8.4 mg/dL (ref 8.4–10.5)
GFR: 53.68 mL/min — ABNORMAL LOW (ref >60.00)
Potassium: 4.2 mEq/L (ref 3.5–5.1)
Sodium: 134 mEq/L — ABNORMAL LOW (ref 135–145)

## 2013-09-27 ENCOUNTER — Encounter: Payer: Self-pay | Admitting: General Surgery

## 2013-09-28 ENCOUNTER — Telehealth: Payer: Self-pay | Admitting: Cardiology

## 2013-09-28 NOTE — Telephone Encounter (Signed)
Due to [facility] on 12/2 you decided to take pt on lasix completely. Pt then came back for a bmet on 12/9 and the results were normal. Pt wants to know if she needs to stay off lasix or if she needs to go back on the lasix.

## 2013-09-28 NOTE — Telephone Encounter (Signed)
Pt made aware

## 2013-09-28 NOTE — Telephone Encounter (Signed)
Stay off lasix

## 2013-09-28 NOTE — Telephone Encounter (Signed)
New message     Want lab results and will she need to change her lasix dosage?

## 2013-11-10 DIAGNOSIS — Z1331 Encounter for screening for depression: Secondary | ICD-10-CM | POA: Diagnosis not present

## 2013-11-10 DIAGNOSIS — N183 Chronic kidney disease, stage 3 unspecified: Secondary | ICD-10-CM | POA: Diagnosis not present

## 2013-11-10 DIAGNOSIS — M549 Dorsalgia, unspecified: Secondary | ICD-10-CM | POA: Diagnosis not present

## 2013-11-10 DIAGNOSIS — I129 Hypertensive chronic kidney disease with stage 1 through stage 4 chronic kidney disease, or unspecified chronic kidney disease: Secondary | ICD-10-CM | POA: Diagnosis not present

## 2013-11-10 DIAGNOSIS — M79609 Pain in unspecified limb: Secondary | ICD-10-CM | POA: Diagnosis not present

## 2013-11-10 DIAGNOSIS — K219 Gastro-esophageal reflux disease without esophagitis: Secondary | ICD-10-CM | POA: Diagnosis not present

## 2014-01-17 DIAGNOSIS — M25579 Pain in unspecified ankle and joints of unspecified foot: Secondary | ICD-10-CM | POA: Diagnosis not present

## 2014-02-02 DIAGNOSIS — M25579 Pain in unspecified ankle and joints of unspecified foot: Secondary | ICD-10-CM | POA: Diagnosis not present

## 2014-02-21 ENCOUNTER — Other Ambulatory Visit: Payer: Self-pay | Admitting: *Deleted

## 2014-02-21 MED ORDER — CARVEDILOL 25 MG PO TABS: ORAL_TABLET | ORAL | Status: DC

## 2014-05-20 ENCOUNTER — Other Ambulatory Visit: Payer: Self-pay | Admitting: Cardiology

## 2014-06-01 DIAGNOSIS — Z23 Encounter for immunization: Secondary | ICD-10-CM | POA: Diagnosis not present

## 2014-06-01 DIAGNOSIS — I129 Hypertensive chronic kidney disease with stage 1 through stage 4 chronic kidney disease, or unspecified chronic kidney disease: Secondary | ICD-10-CM | POA: Diagnosis not present

## 2014-06-01 DIAGNOSIS — N183 Chronic kidney disease, stage 3 unspecified: Secondary | ICD-10-CM | POA: Diagnosis not present

## 2014-06-01 DIAGNOSIS — Z79899 Other long term (current) drug therapy: Secondary | ICD-10-CM | POA: Diagnosis not present

## 2014-06-21 ENCOUNTER — Other Ambulatory Visit: Payer: Self-pay | Admitting: Cardiology

## 2014-06-22 ENCOUNTER — Other Ambulatory Visit: Payer: Self-pay

## 2014-07-07 ENCOUNTER — Other Ambulatory Visit: Payer: Self-pay | Admitting: Cardiology

## 2014-07-10 DIAGNOSIS — Z23 Encounter for immunization: Secondary | ICD-10-CM | POA: Diagnosis not present

## 2014-07-13 DIAGNOSIS — M62838 Other muscle spasm: Secondary | ICD-10-CM | POA: Diagnosis not present

## 2014-07-20 ENCOUNTER — Emergency Department (HOSPITAL_COMMUNITY)
Admission: EM | Admit: 2014-07-20 | Discharge: 2014-07-20 | Disposition: A | Discharge status: Home / Self Care | Payer: Medicare Other | Attending: Emergency Medicine | Admitting: Emergency Medicine

## 2014-07-20 ENCOUNTER — Emergency Department (HOSPITAL_COMMUNITY): Payer: Medicare Other

## 2014-07-20 ENCOUNTER — Encounter (HOSPITAL_COMMUNITY): Payer: Self-pay | Admitting: Emergency Medicine

## 2014-07-20 DIAGNOSIS — R5383 Other fatigue: Secondary | ICD-10-CM | POA: Diagnosis not present

## 2014-07-20 DIAGNOSIS — J40 Bronchitis, not specified as acute or chronic: Secondary | ICD-10-CM

## 2014-07-20 DIAGNOSIS — Z791 Long term (current) use of non-steroidal anti-inflammatories (NSAID): Secondary | ICD-10-CM | POA: Insufficient documentation

## 2014-07-20 DIAGNOSIS — J189 Pneumonia, unspecified organism: Secondary | ICD-10-CM

## 2014-07-20 DIAGNOSIS — I129 Hypertensive chronic kidney disease with stage 1 through stage 4 chronic kidney disease, or unspecified chronic kidney disease: Secondary | ICD-10-CM | POA: Insufficient documentation

## 2014-07-20 DIAGNOSIS — F329 Major depressive disorder, single episode, unspecified: Secondary | ICD-10-CM | POA: Diagnosis not present

## 2014-07-20 DIAGNOSIS — J159 Unspecified bacterial pneumonia: Secondary | ICD-10-CM | POA: Insufficient documentation

## 2014-07-20 DIAGNOSIS — Z7982 Long term (current) use of aspirin: Secondary | ICD-10-CM | POA: Insufficient documentation

## 2014-07-20 DIAGNOSIS — I252 Old myocardial infarction: Secondary | ICD-10-CM | POA: Insufficient documentation

## 2014-07-20 DIAGNOSIS — M1990 Unspecified osteoarthritis, unspecified site: Secondary | ICD-10-CM | POA: Insufficient documentation

## 2014-07-20 DIAGNOSIS — Z79899 Other long term (current) drug therapy: Secondary | ICD-10-CM | POA: Insufficient documentation

## 2014-07-20 DIAGNOSIS — K219 Gastro-esophageal reflux disease without esophagitis: Secondary | ICD-10-CM | POA: Diagnosis not present

## 2014-07-20 DIAGNOSIS — N183 Chronic kidney disease, stage 3 (moderate): Secondary | ICD-10-CM | POA: Insufficient documentation

## 2014-07-20 DIAGNOSIS — R05 Cough: Secondary | ICD-10-CM | POA: Diagnosis present

## 2014-07-20 DIAGNOSIS — M545 Low back pain: Secondary | ICD-10-CM | POA: Diagnosis not present

## 2014-07-20 DIAGNOSIS — J209 Acute bronchitis, unspecified: Secondary | ICD-10-CM | POA: Diagnosis not present

## 2014-07-20 DIAGNOSIS — I480 Paroxysmal atrial fibrillation: Secondary | ICD-10-CM | POA: Diagnosis not present

## 2014-07-20 DIAGNOSIS — E871 Hypo-osmolality and hyponatremia: Secondary | ICD-10-CM

## 2014-07-20 DIAGNOSIS — M549 Dorsalgia, unspecified: Secondary | ICD-10-CM | POA: Diagnosis not present

## 2014-07-20 DIAGNOSIS — R011 Cardiac murmur, unspecified: Secondary | ICD-10-CM | POA: Diagnosis not present

## 2014-07-20 LAB — CBC WITH DIFFERENTIAL/PLATELET
BASOS ABS: 0 10*3/uL (ref 0.0–0.1)
BASOS PCT: 1 % (ref 0–1)
EOS ABS: 0.3 10*3/uL (ref 0.0–0.7)
EOS PCT: 4 % (ref 0–5)
HCT: 32.5 % — ABNORMAL LOW (ref 36.0–46.0)
Hemoglobin: 11.2 g/dL — ABNORMAL LOW (ref 12.0–15.0)
LYMPHS PCT: 18 % (ref 12–46)
Lymphs Abs: 1.5 10*3/uL (ref 0.7–4.0)
MCH: 29.9 pg (ref 26.0–34.0)
MCHC: 34.5 g/dL (ref 30.0–36.0)
MCV: 86.7 fL (ref 78.0–100.0)
Monocytes Absolute: 0.9 10*3/uL (ref 0.1–1.0)
Monocytes Relative: 11 % (ref 3–12)
Neutro Abs: 5.8 10*3/uL (ref 1.7–7.7)
Neutrophils Relative %: 68 % (ref 43–77)
PLATELETS: 363 10*3/uL (ref 150–400)
RBC: 3.75 MIL/uL — ABNORMAL LOW (ref 3.87–5.11)
RDW: 13.1 % (ref 11.5–15.5)
WBC: 8.5 10*3/uL (ref 4.0–10.5)

## 2014-07-20 LAB — I-STAT TROPONIN, ED: TROPONIN I, POC: 0 ng/mL (ref 0.00–0.08)

## 2014-07-20 LAB — COMPREHENSIVE METABOLIC PANEL
ALBUMIN: 3.8 g/dL (ref 3.5–5.2)
ALT: 14 U/L (ref 0–35)
AST: 20 U/L (ref 0–37)
Alkaline Phosphatase: 91 U/L (ref 39–117)
Anion gap: 13 (ref 5–15)
BUN: 17 mg/dL (ref 6–23)
CALCIUM: 9.2 mg/dL (ref 8.4–10.5)
CO2: 23 mEq/L (ref 19–32)
CREATININE: 0.84 mg/dL (ref 0.50–1.10)
Chloride: 91 mEq/L — ABNORMAL LOW (ref 96–112)
GFR calc Af Amer: 69 mL/min — ABNORMAL LOW (ref >90)
GFR calc non Af Amer: 60 mL/min — ABNORMAL LOW (ref >90)
Glucose, Bld: 101 mg/dL — ABNORMAL HIGH (ref 70–99)
Potassium: 4.3 mEq/L (ref 3.7–5.3)
SODIUM: 127 meq/L — AB (ref 137–147)
TOTAL PROTEIN: 7.6 g/dL (ref 6.0–8.3)
Total Bilirubin: 0.4 mg/dL (ref 0.3–1.2)

## 2014-07-20 LAB — URINALYSIS, ROUTINE W REFLEX MICROSCOPIC
Bilirubin Urine: NEGATIVE
GLUCOSE, UA: NEGATIVE mg/dL
Ketones, ur: NEGATIVE mg/dL
LEUKOCYTES UA: NEGATIVE
Nitrite: NEGATIVE
Protein, ur: 100 mg/dL — AB
SPECIFIC GRAVITY, URINE: 1.013 (ref 1.005–1.030)
Urobilinogen, UA: 1 mg/dL (ref 0.0–1.0)
pH: 6 (ref 5.0–8.0)

## 2014-07-20 LAB — URINE MICROSCOPIC-ADD ON

## 2014-07-20 LAB — I-STAT CG4 LACTIC ACID, ED: Lactic Acid, Venous: 0.73 mmol/L (ref 0.5–2.2)

## 2014-07-20 MED ORDER — AMOXICILLIN-POT CLAVULANATE 875-125 MG PO TABS: 1.0000 | ORAL_TABLET | Freq: Two times a day (BID) | ORAL | Status: DC

## 2014-07-20 MED ORDER — PIPERACILLIN-TAZOBACTAM 3.375 G IVPB 30 MIN
3.3750 g | Freq: Once | INTRAVENOUS | Status: AC
  Administered 2014-07-20: 3.375 g via INTRAVENOUS
  Filled 2014-07-20: qty 50

## 2014-07-20 MED ORDER — ALBUTEROL SULFATE HFA 108 (90 BASE) MCG/ACT IN AERS
1.0000 | INHALATION_SPRAY | Freq: Four times a day (QID) | RESPIRATORY_TRACT | Status: DC | PRN
Start: 2014-07-20 — End: 2015-02-15

## 2014-07-20 MED ORDER — SODIUM CHLORIDE 0.9 % IV BOLUS (SEPSIS)
1000.0000 mL | Freq: Once | INTRAVENOUS | Status: AC
  Administered 2014-07-20: 1000 mL via INTRAVENOUS

## 2014-07-20 MED ORDER — IPRATROPIUM-ALBUTEROL 0.5-2.5 (3) MG/3ML IN SOLN
3.0000 mL | Freq: Once | RESPIRATORY_TRACT | Status: AC
  Administered 2014-07-20: 3 mL via RESPIRATORY_TRACT
  Filled 2014-07-20: qty 3

## 2014-07-20 NOTE — ED Notes (Addendum)
Pt in c/o lower back pain, lower abd pain, productive cough, generalized fatigue. Pt states she feels like she has the flu. States she was recently treated for a pulled muscle in her back and states the pain is continued from that. Pt denies pain at this time, but states movement will make it worse.

## 2014-07-20 NOTE — ED Notes (Signed)
Lab results of CG4  Given to Dr.Yao.

## 2014-07-20 NOTE — Discharge Instructions (Signed)
Take augmentin for a week.   Use albuterol as needed for trouble breathing.   Take tylenol for pain.   Stay hydrated.   Recheck sodium in a week.   Follow up with your doctor next week.   Return to ER if you have worse weakness, abdominal pain, vomiting, fevers.

## 2014-07-20 NOTE — ED Notes (Signed)
Patient transported to X-ray 

## 2014-07-20 NOTE — ED Provider Notes (Signed)
CSN: [id number]     Arrival date & time 07/20/14  1132 History   First MD Initiated Contact with Patient 07/20/14 1140     Chief Complaint  Patient presents with  . Cough  . Back Pain     (Consider location/radiation/quality/duration/timing/severity/associated sxs/prior Treatment) The history is provided by the patient.  [name] is a 80 y.o. female hx of GERD, takotsubo cardiomyopathy, MI, CKD here with weakness, myalgias, back pain, chills. She started having lower back pain a week ago. Went to urgent care and was given muscle relaxants and felt better. For the last 3-4 days developed more sinus and chest congestion. Also has some more chills and generalized fatigue. She ran out of muscle relaxants so her back is hurting more. Has productive cough as well. Denies vomiting or diarrhea. Had flu shot and pneumonia shot this year.     Past Medical History  Diagnosis Date  . Arthritis   . Heart murmur   . GERD (gastroesophageal reflux disease)   . Takotsubo cardiomyopathy     EF normalized to 70%  . Old MI (myocardial infarction)   . Back pain   . SIADH (syndrome of inappropriate ADH production)   . Hypertension   . PMR (polymyalgia rheumatica)   . GI bleed due to NSAIDs   . Depression   . Osteoporosis   . Hyperlipidemia   . CKD (chronic kidney disease) stage 3, GFR 30-59 ml/min   . PAF (paroxysmal atrial fibrillation) 2006    not on anticoagulation due to GI bleed  . Ventricular tachycardia     on initial presentation of MI   Past Surgical History  Procedure Laterality Date  . Eye surgery  06/10/11    membrane peel   . Rotator cuff repair    . Nasal sinus surgery    . Wisdom tooth extraction    . Cholecystectomy  11/10/2011    Procedure: LAPAROSCOPIC CHOLECYSTECTOMY WITH INTRAOPERATIVE CHOLANGIOGRAM;  Surgeon: Pedro Earls, MD;  Location: [facility];  Service: General;  Laterality: N/A;  . Umbilical hernia repair  11/10/2011    Procedure: HERNIA REPAIR  UMBILICAL ADULT;  Surgeon: Pedro Earls, MD;  Location: [facility];  Service: General;  Laterality: N/A;  . Cardiac catheterization      normal coronary arteries   Family History  Problem Relation Age of Onset  . Cancer Mother    History  Substance Use Topics  . Smoking status: Never Smoker   . Smokeless tobacco: Never Used  . Alcohol Use: No     Comment: occassionally   OB History   Grav Para Term Preterm Abortions TAB SAB Ect Mult Living                 Review of Systems  Constitutional: Positive for chills and fatigue.  Respiratory: Positive for cough.   Musculoskeletal: Positive for back pain.  All other systems reviewed and are negative.     Allergies  Amlodipine besylate; Avelox; Duragesic disc transdermal system; Lidocaine; Morphine and related; and Teriparatide (recombinant)  Home Medications   Prior to Admission medications   Medication Sig Start Date End Date Taking? Authorizing Provider  acetaminophen (TYLENOL) 500 MG tablet Take 500 mg by mouth every 6 (six) hours as needed (for pain).    Yes Historical Provider, MD  amLODipine (NORVASC) 2.5 MG tablet Take 2.5 mg by mouth daily.   Yes Historical Provider, MD  aspirin EC 81 MG tablet Take 81 mg by mouth at bedtime.  Yes Historical Provider, MD  azelastine (ASTELIN) 137 MCG/SPRAY nasal spray Place 2 sprays into the nose 2 (two) times daily.  09/28/11  Yes Historical Provider, MD  buPROPion (WELLBUTRIN XL) 150 MG 24 hr tablet Take 150 mg by mouth 2 (two) times daily.  08/31/11  Yes Historical Provider, MD  carvedilol (COREG) 25 MG tablet Take 25 mg by mouth 2 (two) times daily with a meal.   Yes Historical Provider, MD  cetirizine (ZYRTEC) 10 MG tablet Take 10 mg by mouth daily.    Yes Historical Provider, MD  cloNIDine (CATAPRES) 0.1 MG tablet Take 0.1 mg by mouth 2 (two) times daily.  10/02/11  Yes Historical Provider, MD  demeclocycline (DECLOMYCIN) 150 MG tablet Take 150 mg by mouth 2 (two) times daily.   08/17/11  Yes Historical Provider, MD  diclofenac sodium (VOLTAREN) 1 % GEL Apply 1 application topically 4 (four) times daily as needed. Pain    Yes Historical Provider, MD  fish oil-omega-3 fatty acids 1000 MG capsule Take 1 g by mouth daily.    Yes Historical Provider, MD  ibuprofen (ADVIL,MOTRIN) 200 MG tablet Take 200 mg by mouth 3 (three) times daily.   Yes Historical Provider, MD  Multiple Vitamins-Minerals (CENTRUM CARDIO PO) Take 1 tablet by mouth 2 (two) times daily.    Yes Historical Provider, MD  omeprazole (PRILOSEC) 20 MG capsule Take 20 mg by mouth 2 (two) times daily.   Yes Historical Provider, MD  pregabalin (LYRICA) 50 MG capsule Take 50 mg by mouth daily.    Yes Historical Provider, MD  ramipril (ALTACE) 10 MG capsule Take 20 mg by mouth daily after breakfast.    Yes Historical Provider, MD  tiZANidine (ZANAFLEX) 4 MG tablet Take 2-4 mg by mouth every 8 (eight) hours as needed for muscle spasms.   Yes Historical Provider, MD  traMADol (ULTRAM-ER) 300 MG 24 hr tablet Take 300 mg by mouth daily as needed. Pain  09/28/11  Yes Historical Provider, MD   BP 162/71  Pulse 63  Temp(Src) 97.5 F (36.4 C) (Oral)  Resp 24  SpO2 96% Physical Exam  Nursing note and vitals reviewed. Constitutional: She is oriented to person, place, and time.  Chronically ill   HENT:  Head: Normocephalic.  MM slightly dry   Eyes: Conjunctivae are normal. Pupils are equal, round, and reactive to light.  Neck: Normal range of motion. Neck supple.  Cardiovascular: Normal rate, regular rhythm and normal heart sounds.   Pulmonary/Chest:  Slightly tachypneic, diminished throughout, ? Wheezing, minimal crackles on bases   Abdominal: Soft. She exhibits no distension. There is no tenderness. There is no rebound and no guarding.  No CVAT   Musculoskeletal:  L paralumbar tenderness   Neurological: She is alert and oriented to person, place, and time. No cranial nerve deficit. Coordination normal.  Skin:  Skin is warm and dry.  Psychiatric: She has a normal mood and affect. Her behavior is normal. Judgment and thought content normal.    ED Course  Procedures (including critical care time) Labs Review Labs Reviewed  CBC WITH DIFFERENTIAL - Abnormal; Notable for the following:    RBC 3.75 (*)    Hemoglobin 11.2 (*)    HCT 32.5 (*)    All other components within normal limits  COMPREHENSIVE METABOLIC PANEL - Abnormal; Notable for the following:    Sodium 127 (*)    Chloride 91 (*)    Glucose, Bld 101 (*)    GFR calc non Af Wyvonnia Lora  60 (*)    GFR calc Af Amer 69 (*)    All other components within normal limits  URINALYSIS, ROUTINE W REFLEX MICROSCOPIC - Abnormal; Notable for the following:    Hgb urine dipstick TRACE (*)    Protein, ur 100 (*)    All other components within normal limits  URINE CULTURE  URINE MICROSCOPIC-ADD ON  Randolm Idol, ED  I-STAT CG4 LACTIC ACID, ED    Imaging Review Dg Chest 2 View  07/20/2014   CLINICAL DATA:  Cough and back pain.  EXAM: CHEST  2 VIEW  COMPARISON:  02/23/2012  FINDINGS: Cardiomediastinal silhouette is unchanged. Cardiac silhouette is within normal limits for size. Thoracic aorta is again seen to be tortuous and calcified. Lungs are mildly hyperinflated. Mild left basilar opacity on the prior study has resolved. Mild, diffuse prominence of the interstitial markings is unchanged. No confluent airspace opacity, pleural effusion, or pneumothorax is identified. Sequelae of prior vertebral augmentation are again identified at multiple levels. High-riding humeral heads are again noted suggestive of chronic bilateral rotator cuff tears.  IMPRESSION: No evidence of active cardiopulmonary disease.   Electronically Signed   By: Logan Bores   On: 07/20/2014 13:18     EKG Interpretation None      Date: 07/20/2014  Rate: 58  Rhythm: normal sinus rhythm  QRS Axis: left  Intervals: PR prolonged  ST/T Wave abnormalities: nonspecific ST changes   Conduction Disutrbances:none  Narrative Interpretation:   Old EKG Reviewed: none available    MDM   Final diagnoses:  None    [name] is a 80 y.o. female here with cough, fatigue. I think likely bronchitis vs pneumonia vs UTI. Will get labs and hydrate and get CXR and UA.   3:30 PM Labs showed Na 127, likely from dehydration. CXR and UA clear. However, I suspect pneumonia based on her history and low Na (may legionella). Given zosyn, will d/c home on augmentin. Recommend recheck Na in a week.    Wandra Arthurs, MD 07/20/14 [phone]

## 2014-07-22 LAB — URINE CULTURE: Colony Count: 75000

## 2014-07-23 ENCOUNTER — Emergency Department (HOSPITAL_COMMUNITY): Payer: BC Managed Care – PPO

## 2014-07-23 ENCOUNTER — Emergency Department (HOSPITAL_COMMUNITY)
Admission: EM | Admit: 2014-07-23 | Discharge: 2014-07-23 | Disposition: A | Discharge status: Home / Self Care | Payer: BC Managed Care – PPO | Attending: Emergency Medicine | Admitting: Emergency Medicine

## 2014-07-23 ENCOUNTER — Encounter (HOSPITAL_COMMUNITY): Payer: Self-pay | Admitting: Emergency Medicine

## 2014-07-23 DIAGNOSIS — E222 Syndrome of inappropriate secretion of antidiuretic hormone: Secondary | ICD-10-CM | POA: Insufficient documentation

## 2014-07-23 DIAGNOSIS — I252 Old myocardial infarction: Secondary | ICD-10-CM | POA: Diagnosis not present

## 2014-07-23 DIAGNOSIS — R0602 Shortness of breath: Secondary | ICD-10-CM | POA: Diagnosis not present

## 2014-07-23 DIAGNOSIS — R011 Cardiac murmur, unspecified: Secondary | ICD-10-CM | POA: Diagnosis not present

## 2014-07-23 DIAGNOSIS — M546 Pain in thoracic spine: Secondary | ICD-10-CM

## 2014-07-23 DIAGNOSIS — R0600 Dyspnea, unspecified: Secondary | ICD-10-CM | POA: Insufficient documentation

## 2014-07-23 DIAGNOSIS — I2510 Atherosclerotic heart disease of native coronary artery without angina pectoris: Secondary | ICD-10-CM | POA: Diagnosis not present

## 2014-07-23 DIAGNOSIS — Z792 Long term (current) use of antibiotics: Secondary | ICD-10-CM | POA: Diagnosis not present

## 2014-07-23 DIAGNOSIS — K219 Gastro-esophageal reflux disease without esophagitis: Secondary | ICD-10-CM | POA: Diagnosis not present

## 2014-07-23 DIAGNOSIS — Z7982 Long term (current) use of aspirin: Secondary | ICD-10-CM | POA: Insufficient documentation

## 2014-07-23 DIAGNOSIS — Z79899 Other long term (current) drug therapy: Secondary | ICD-10-CM | POA: Diagnosis not present

## 2014-07-23 DIAGNOSIS — N183 Chronic kidney disease, stage 3 (moderate): Secondary | ICD-10-CM | POA: Insufficient documentation

## 2014-07-23 DIAGNOSIS — R069 Unspecified abnormalities of breathing: Secondary | ICD-10-CM | POA: Diagnosis not present

## 2014-07-23 DIAGNOSIS — I129 Hypertensive chronic kidney disease with stage 1 through stage 4 chronic kidney disease, or unspecified chronic kidney disease: Secondary | ICD-10-CM | POA: Insufficient documentation

## 2014-07-23 DIAGNOSIS — I472 Ventricular tachycardia: Secondary | ICD-10-CM | POA: Insufficient documentation

## 2014-07-23 DIAGNOSIS — I480 Paroxysmal atrial fibrillation: Secondary | ICD-10-CM | POA: Diagnosis not present

## 2014-07-23 LAB — CBC WITH DIFFERENTIAL/PLATELET
BASOS PCT: 1 % (ref 0–1)
Basophils Absolute: 0.1 10*3/uL (ref 0.0–0.1)
Eosinophils Absolute: 0.2 10*3/uL (ref 0.0–0.7)
Eosinophils Relative: 4 % (ref 0–5)
HCT: 30.9 % — ABNORMAL LOW (ref 36.0–46.0)
HEMOGLOBIN: 10.5 g/dL — AB (ref 12.0–15.0)
Lymphocytes Relative: 21 % (ref 12–46)
Lymphs Abs: 0.9 10*3/uL (ref 0.7–4.0)
MCH: 29.3 pg (ref 26.0–34.0)
MCHC: 34 g/dL (ref 30.0–36.0)
MCV: 86.3 fL (ref 78.0–100.0)
Monocytes Absolute: 0.3 10*3/uL (ref 0.1–1.0)
Monocytes Relative: 8 % (ref 3–12)
NEUTROS PCT: 66 % (ref 43–77)
Neutro Abs: 2.8 10*3/uL (ref 1.7–7.7)
Platelets: 300 10*3/uL (ref 150–400)
RBC: 3.58 MIL/uL — ABNORMAL LOW (ref 3.87–5.11)
RDW: 13.1 % (ref 11.5–15.5)
WBC: 4.2 10*3/uL (ref 4.0–10.5)

## 2014-07-23 LAB — COMPREHENSIVE METABOLIC PANEL
ALBUMIN: 3.1 g/dL — AB (ref 3.5–5.2)
ALT: 13 U/L (ref 0–35)
ANION GAP: 11 (ref 5–15)
AST: 24 U/L (ref 0–37)
Alkaline Phosphatase: 76 U/L (ref 39–117)
BUN: 16 mg/dL (ref 6–23)
CALCIUM: 8.7 mg/dL (ref 8.4–10.5)
CO2: 25 mEq/L (ref 19–32)
CREATININE: 0.87 mg/dL (ref 0.50–1.10)
Chloride: 96 mEq/L (ref 96–112)
GFR calc Af Amer: 66 mL/min — ABNORMAL LOW (ref >90)
GFR calc non Af Amer: 57 mL/min — ABNORMAL LOW (ref >90)
Glucose, Bld: 106 mg/dL — ABNORMAL HIGH (ref 70–99)
POTASSIUM: 4.7 meq/L (ref 3.7–5.3)
Sodium: 132 mEq/L — ABNORMAL LOW (ref 137–147)
Total Bilirubin: 0.2 mg/dL — ABNORMAL LOW (ref 0.3–1.2)
Total Protein: 6.8 g/dL (ref 6.0–8.3)

## 2014-07-23 LAB — I-STAT TROPONIN, ED: Troponin i, poc: 0.01 ng/mL (ref 0.00–0.08)

## 2014-07-23 LAB — PRO B NATRIURETIC PEPTIDE: PRO B NATRI PEPTIDE: 709.9 pg/mL — AB (ref 0–450)

## 2014-07-23 MED ORDER — FUROSEMIDE 20 MG PO TABS: 20.0000 mg | ORAL_TABLET | Freq: Every day | ORAL | Status: DC

## 2014-07-23 MED ORDER — IOHEXOL 350 MG/ML SOLN
100.0000 mL | Freq: Once | INTRAVENOUS | Status: AC | PRN
Start: 2014-07-23 — End: 2014-07-23
  Administered 2014-07-23: 100 mL via INTRAVENOUS

## 2014-07-23 NOTE — ED Provider Notes (Signed)
CSN: [id number]     Arrival date & time 07/23/14  1027 History   First MD Initiated Contact with Patient 07/23/14 1027     Chief Complaint  Patient presents with  . Shortness of Breath     (Consider location/radiation/quality/duration/timing/severity/associated sxs/prior Treatment) HPI Comments: Patient with history of hypertension, atrial fibrillation, takotsubo cardiomyopathy. She presents today with complaints of continued difficulty breathing and pain across her upper back that is pleuritic in nature. She's had these symptoms for going on 2 weeks now. She was initially seen at urgent care and was told it was likely a pulled muscle. She was given a muscle relaxer with little relief. She was seen here 3 days ago and had workup performed which revealed no obvious cause. She was diagnosed with presumed pneumonia and treated with Zosyn and Augmentin. She returns today with symptoms that are unimproved despite the above treatment. She tells me she feels as though she can breathe in, however is having difficulty exhaling. She denies any fevers or chills. She denies any productive cough. She denies any chest pain, but does report discomfort in her shoulder blades when she breathes deeply.  Patient is a 80 y.o. female presenting with shortness of breath. The history is provided by the patient.  Shortness of Breath Severity:  Moderate Onset quality:  Gradual Duration:  10 days Timing:  Constant Chronicity:  New Context: activity   Relieved by:  Nothing Worsened by:  Nothing tried Ineffective treatments:  None tried Associated symptoms: no cough, no fever and no sputum production     Past Medical History  Diagnosis Date  . Arthritis   . Heart murmur   . GERD (gastroesophageal reflux disease)   . Takotsubo cardiomyopathy     EF normalized to 70%  . Old MI (myocardial infarction)   . Back pain   . SIADH (syndrome of inappropriate ADH production)   . Hypertension   . PMR (polymyalgia  rheumatica)   . GI bleed due to NSAIDs   . Depression   . Osteoporosis   . Hyperlipidemia   . CKD (chronic kidney disease) stage 3, GFR 30-59 ml/min   . PAF (paroxysmal atrial fibrillation) 2006    not on anticoagulation due to GI bleed  . Ventricular tachycardia     on initial presentation of MI   Past Surgical History  Procedure Laterality Date  . Eye surgery  06/10/11    membrane peel   . Rotator cuff repair    . Nasal sinus surgery    . Wisdom tooth extraction    . Cholecystectomy  11/10/2011    Procedure: LAPAROSCOPIC CHOLECYSTECTOMY WITH INTRAOPERATIVE CHOLANGIOGRAM;  Surgeon: Pedro Earls, MD;  Location: [facility];  Service: General;  Laterality: N/A;  . Umbilical hernia repair  11/10/2011    Procedure: HERNIA REPAIR UMBILICAL ADULT;  Surgeon: Pedro Earls, MD;  Location: [facility];  Service: General;  Laterality: N/A;  . Cardiac catheterization      normal coronary arteries   Family History  Problem Relation Age of Onset  . Cancer Mother    History  Substance Use Topics  . Smoking status: Never Smoker   . Smokeless tobacco: Never Used  . Alcohol Use: No     Comment: occassionally   OB History   Grav Para Term Preterm Abortions TAB SAB Ect Mult Living                 Review of Systems  Constitutional: Negative for fever.  Respiratory:  Positive for shortness of breath. Negative for cough and sputum production.   All other systems reviewed and are negative.     Allergies  Amlodipine besylate; Avelox; Duragesic disc transdermal system; Lidocaine; Morphine and related; and Teriparatide (recombinant)  Home Medications   Prior to Admission medications   Medication Sig Start Date End Date Taking? Authorizing Provider  albuterol (PROVENTIL HFA;VENTOLIN HFA) 108 (90 BASE) MCG/ACT inhaler Inhale 1-2 puffs into the lungs every 6 (six) hours as needed for wheezing or shortness of breath. 07/20/14  Yes Wandra Arthurs, MD  acetaminophen (TYLENOL) 500 MG tablet Take 500 mg  by mouth every 6 (six) hours as needed (for pain).     Historical Provider, MD  amLODipine (NORVASC) 2.5 MG tablet Take 2.5 mg by mouth daily.    Historical Provider, MD  amoxicillin-clavulanate (AUGMENTIN) 875-125 MG per tablet Take 1 tablet by mouth every 12 (twelve) hours. 07/20/14   Wandra Arthurs, MD  aspirin EC 81 MG tablet Take 81 mg by mouth at bedtime.     Historical Provider, MD  azelastine (ASTELIN) 137 MCG/SPRAY nasal spray Place 2 sprays into the nose 2 (two) times daily.  09/28/11   Historical Provider, MD  buPROPion (WELLBUTRIN XL) 150 MG 24 hr tablet Take 150 mg by mouth 2 (two) times daily.  08/31/11   Historical Provider, MD  carvedilol (COREG) 25 MG tablet Take 25 mg by mouth 2 (two) times daily with a meal.    Historical Provider, MD  cetirizine (ZYRTEC) 10 MG tablet Take 10 mg by mouth daily.     Historical Provider, MD  cloNIDine (CATAPRES) 0.1 MG tablet Take 0.1 mg by mouth 2 (two) times daily.  10/02/11   Historical Provider, MD  demeclocycline (DECLOMYCIN) 150 MG tablet Take 150 mg by mouth 2 (two) times daily.  08/17/11   Historical Provider, MD  diclofenac sodium (VOLTAREN) 1 % GEL Apply 1 application topically 4 (four) times daily as needed. Pain     Historical Provider, MD  fish oil-omega-3 fatty acids 1000 MG capsule Take 1 g by mouth daily.     Historical Provider, MD  ibuprofen (ADVIL,MOTRIN) 200 MG tablet Take 200 mg by mouth 3 (three) times daily.    Historical Provider, MD  Multiple Vitamins-Minerals (CENTRUM CARDIO PO) Take 1 tablet by mouth 2 (two) times daily.     Historical Provider, MD  omeprazole (PRILOSEC) 20 MG capsule Take 20 mg by mouth 2 (two) times daily.    Historical Provider, MD  pregabalin (LYRICA) 50 MG capsule Take 50 mg by mouth daily.     Historical Provider, MD  ramipril (ALTACE) 10 MG capsule Take 20 mg by mouth daily after breakfast.     Historical Provider, MD  tiZANidine (ZANAFLEX) 4 MG tablet Take 2-4 mg by mouth every 8 (eight) hours as  needed for muscle spasms.    Historical Provider, MD  traMADol (ULTRAM-ER) 300 MG 24 hr tablet Take 300 mg by mouth daily as needed. Pain  09/28/11   Historical Provider, MD   BP 163/59  Pulse 64  Resp 15  Wt 124 lb (56.246 kg)  SpO2 99% Physical Exam  Nursing note and vitals reviewed. Constitutional: She is oriented to person, place, and time. She appears well-developed and well-nourished. No distress.  HENT:  Head: Normocephalic and atraumatic.  Neck: Normal range of motion. Neck supple.  Cardiovascular: Normal rate and regular rhythm.  Exam reveals no gallop and no friction rub.   No murmur heard. Pulmonary/Chest:  Effort normal and breath sounds normal. No respiratory distress. She has no wheezes.  Abdominal: Soft. Bowel sounds are normal. She exhibits no distension. There is no tenderness.  Musculoskeletal: Normal range of motion. She exhibits no edema.  Neurological: She is alert and oriented to person, place, and time.  Skin: Skin is warm and dry. She is not diaphoretic.    ED Course  Procedures (including critical care time) Labs Review Labs Reviewed  CBC WITH DIFFERENTIAL  COMPREHENSIVE METABOLIC PANEL  PRO B NATRIURETIC PEPTIDE  I-STAT [place], ED    Imaging Review No results found.   EKG Interpretation   Date/Time:  Monday July 23 2014 11:22:14 EDT Ventricular Rate:  62 PR Interval:  254 QRS Duration: 92 QT Interval:  417 QTC Calculation: 423 R Axis:   -41 Text Interpretation:  Sinus rhythm Prolonged PR interval Left atrial  enlargement Left axis deviation Confirmed by DELOS  MD, Muhammed Teutsch ([id number]) on  07/23/2014 12:01:48 PM      MDM   Final diagnoses:  None    Patient is an 80 year old female who presents with complaints of episodic shortness of breath and pain in her back that have been occurring independently of each other for the past 2 weeks. She was seen at urgent care, and again here at the ER 3 days ago and no definite cause was found. She  was treated with antibiotics and albuterol for presumed pneumonia. She's not improving.  Workup today reveals a CT scan that is negative for pulmonary embolism or other intrathoracic pathology. There is no evidence for pneumonia. Her EKG is unchanged and troponin is negative as well. She does have a mildly elevated BNP. She had been on Lasix until approximately 2 months ago her primary doctor stopped this. It is possible that mild CHF may be contributing to her symptoms. I will recommend restarting a low dose of Lasix and have her followup with her primary doctor towards the end of the week.    Veryl Speak, MD 07/23/14 1420

## 2014-07-23 NOTE — ED Notes (Signed)
To ED via [facility] from home with c/o  Increased shortness of breath, was seen here Friday and dx with pneumonia. States had a difficult time breathing last night, with wheezing- received 1 Albuterol neb and Solumedrol 125mg  IV per EMS. A/O x4 on arrival -- no acute resp distress.

## 2014-07-23 NOTE — Discharge Instructions (Signed)
Lasix 20 mg once daily for the next week.  Followup with your primary Dr. towards the end of the week to be rechecked, and return to the emergency department if you develop severe chest pain, difficulty breathing, or other new and concerning symptoms.   Shortness of Breath Shortness of breath means you have trouble breathing. It could also mean that you have a medical problem. You should get immediate medical care for shortness of breath. CAUSES   Not enough oxygen in the air such as with high altitudes or a smoke-filled room.  Certain lung diseases, infections, or problems.  Heart disease or conditions, such as angina or heart failure.  Low red blood cells (anemia).  Poor physical fitness, which can cause shortness of breath when you exercise.  Chest or back injuries or stiffness.  Being overweight.  Smoking.  Anxiety, which can make you feel like you are not getting enough air. DIAGNOSIS  Serious medical problems can often be found during your physical exam. Tests may also be done to determine why you are having shortness of breath. Tests may include:  Chest X-rays.  Lung function tests.  Blood tests.  An electrocardiogram (ECG).  An ambulatory electrocardiogram. An ambulatory ECG records your heartbeat patterns over a 24-hour period.  Exercise testing.  A transthoracic echocardiogram (TTE). During echocardiography, sound waves are used to evaluate how blood flows through your heart.  A transesophageal echocardiogram (TEE).  Imaging scans. Your health care provider may not be able to find a cause for your shortness of breath after your exam. In this case, it is important to have a follow-up exam with your health care provider as directed.  TREATMENT  Treatment for shortness of breath depends on the cause of your symptoms and can vary greatly. HOME CARE INSTRUCTIONS   Do not smoke. Smoking is a common cause of shortness of breath. If you smoke, ask for help to  quit.  Avoid being around chemicals or things that may bother your breathing, such as paint fumes and dust.  Rest as needed. Slowly resume your usual activities.  If medicines were prescribed, take them as directed for the full length of time directed. This includes oxygen and any inhaled medicines.  Keep all follow-up appointments as directed by your health care provider. SEEK MEDICAL CARE IF:   Your condition does not improve in the time expected.  You have a hard time doing your normal activities even with rest.  You have any new symptoms. SEEK IMMEDIATE MEDICAL CARE IF:   Your shortness of breath gets worse.  You feel light-headed, faint, or develop a cough not controlled with medicines.  You start coughing up blood.  You have pain with breathing.  You have chest pain or pain in your arms, shoulders, or abdomen.  You have a fever.  You are unable to walk up stairs or exercise the way you normally do. MAKE SURE YOU:  Understand these instructions.  Will watch your condition.  Will get help right away if you are not doing well or get worse. Document Released: 06/30/2001 Document Revised: 10/10/2013 Document Reviewed: 12/21/2011 [facility] Patient Information 2015 [place], [place]. This information is not intended to replace advice given to you by your health care provider. Make sure you discuss any questions you have with your health care provider.

## 2014-07-23 NOTE — ED Notes (Signed)
Pt states that she is having a hard time exhaling-- with some wheezes, worse with exertion.

## 2014-07-24 ENCOUNTER — Telehealth (HOSPITAL_BASED_OUTPATIENT_CLINIC_OR_DEPARTMENT_OTHER): Payer: Self-pay

## 2014-07-24 ENCOUNTER — Other Ambulatory Visit: Payer: Self-pay | Admitting: Cardiology

## 2014-07-24 NOTE — Telephone Encounter (Signed)
Post ED Visit - Positive Culture Follow-up  Culture report reviewed by antimicrobial stewardship pharmacist: []  Wes [place], Pharm.D., BCPS [x]  Heide Guile, Pharm.D., BCPS []  Alycia Rossetti, Pharm.D., BCPS []  [place], Pharm.D., BCPS, AAHIVP []  Legrand Como, Pharm.D., BCPS, AAHIVP []  Carly Sabat, Pharm.D. []  Elenor Quinones, Pharm.D.  Positive Urine culture Treated with Amoxicillin- Pot. Clavulanate, organism sensitive to the same and no further patient follow-up is required at this time.  Dortha Kern 07/24/2014, 5:36 AM

## 2014-07-25 ENCOUNTER — Other Ambulatory Visit: Payer: Self-pay

## 2014-07-25 MED ORDER — CARVEDILOL 25 MG PO TABS: 25.0000 mg | ORAL_TABLET | Freq: Two times a day (BID) | ORAL | Status: DC

## 2014-07-27 ENCOUNTER — Other Ambulatory Visit (HOSPITAL_COMMUNITY): Payer: Self-pay | Admitting: Respiratory Therapy

## 2014-07-27 DIAGNOSIS — I129 Hypertensive chronic kidney disease with stage 1 through stage 4 chronic kidney disease, or unspecified chronic kidney disease: Secondary | ICD-10-CM | POA: Diagnosis not present

## 2014-07-27 DIAGNOSIS — I480 Paroxysmal atrial fibrillation: Secondary | ICD-10-CM | POA: Diagnosis not present

## 2014-07-27 DIAGNOSIS — R0609 Other forms of dyspnea: Secondary | ICD-10-CM | POA: Diagnosis not present

## 2014-07-27 DIAGNOSIS — E871 Hypo-osmolality and hyponatremia: Secondary | ICD-10-CM | POA: Diagnosis not present

## 2014-07-27 DIAGNOSIS — R0600 Dyspnea, unspecified: Secondary | ICD-10-CM

## 2014-07-27 DIAGNOSIS — N183 Chronic kidney disease, stage 3 (moderate): Secondary | ICD-10-CM | POA: Diagnosis not present

## 2014-07-27 DIAGNOSIS — F3342 Major depressive disorder, recurrent, in full remission: Secondary | ICD-10-CM | POA: Diagnosis not present

## 2014-07-31 ENCOUNTER — Ambulatory Visit (HOSPITAL_COMMUNITY)
Admission: RE | Admit: 2014-07-31 | Discharge: 2014-07-31 | Disposition: A | Discharge status: Home / Self Care | Payer: Medicare Other | Source: Ambulatory Visit | Attending: Geriatric Medicine | Admitting: Geriatric Medicine

## 2014-07-31 DIAGNOSIS — R0600 Dyspnea, unspecified: Secondary | ICD-10-CM | POA: Diagnosis not present

## 2014-07-31 LAB — PULMONARY FUNCTION TEST
DL/VA % pred: 72 %
DL/VA: 3.4 ml/min/mmHg/L
DLCO UNC: 11.26 ml/min/mmHg
DLCO cor % pred: 49 %
DLCO cor: 11.26 ml/min/mmHg
DLCO unc % pred: 49 %
FEF 25-75 POST: 1.3 L/s
FEF 25-75 Pre: 1.21 L/sec
FEF2575-%CHANGE-POST: 7 %
FEF2575-%PRED-PRE: 142 %
FEF2575-%Pred-Post: 153 %
FEV1-%Change-Post: 3 %
FEV1-%PRED-POST: 76 %
FEV1-%PRED-PRE: 74 %
FEV1-POST: 1.15 L
FEV1-Pre: 1.11 L
FEV1FVC-%CHANGE-POST: 4 %
FEV1FVC-%Pred-Pre: 113 %
FEV6-%Change-Post: 0 %
FEV6-%PRED-PRE: 70 %
FEV6-%Pred-Post: 70 %
FEV6-PRE: 1.34 L
FEV6-Post: 1.34 L
FEV6FVC-%Change-Post: 1 %
FEV6FVC-%PRED-POST: 107 %
FEV6FVC-%Pred-Pre: 105 %
FVC-%Change-Post: -1 %
FVC-%Pred-Post: 65 %
FVC-%Pred-Pre: 66 %
FVC-PRE: 1.36 L
FVC-Post: 1.34 L
POST FEV1/FVC RATIO: 86 %
PRE FEV6/FVC RATIO: 98 %
Post FEV6/FVC ratio: 100 %
Pre FEV1/FVC ratio: 82 %
RV % PRED: 81 %
RV: 2.06 L
TLC % PRED: 75 %
TLC: 3.7 L

## 2014-07-31 MED ORDER — ALBUTEROL SULFATE (2.5 MG/3ML) 0.083% IN NEBU
2.5000 mg | INHALATION_SOLUTION | Freq: Once | RESPIRATORY_TRACT | Status: AC
  Administered 2014-07-31: 2.5 mg via RESPIRATORY_TRACT

## 2014-08-17 ENCOUNTER — Ambulatory Visit (INDEPENDENT_AMBULATORY_CARE_PROVIDER_SITE_OTHER): Payer: Medicare Other | Admitting: Cardiology

## 2014-08-17 ENCOUNTER — Ambulatory Visit: Payer: Medicare Other | Admitting: Cardiology

## 2014-08-17 ENCOUNTER — Encounter: Payer: Self-pay | Admitting: Cardiology

## 2014-08-17 VITALS — BP 128/62 | HR 60 | Ht 63.0 in | Wt 121.0 lb

## 2014-08-17 DIAGNOSIS — I10 Essential (primary) hypertension: Secondary | ICD-10-CM | POA: Diagnosis not present

## 2014-08-17 DIAGNOSIS — I480 Paroxysmal atrial fibrillation: Secondary | ICD-10-CM | POA: Diagnosis not present

## 2014-08-17 DIAGNOSIS — I5181 Takotsubo syndrome: Secondary | ICD-10-CM | POA: Diagnosis not present

## 2014-08-17 NOTE — Progress Notes (Signed)
[address], [place]  [address] Phone: [phone] Fax:  [phone]  Date:  08/17/2014   ID:  [name], DOB 01-14-1923, MRN [id number]  PCP:  Mathews Argyle, MD  Cardiologist:  Fransico Him, MD    History of Present Illness: [name] is a 80 y.o. female with a history of stress induced MI, Takotubo DM with normalized EF,  hypertensive heart disease , PAF who presents today for followup. Last month she had a flu shot and had a bad reaction and then developed PNA and was treated with antibiotics.  She continue to have some SOB and went to the ER. In the ER her BNP was elevated mildly and she was restarted on her Lasix.  She is feeling better.  She says that she never went back on the Lasix.  She denies any chest pain, SOB, DOE, dizziness, palpitations or syncope. She has had some sinus drainage.  She has chronic LLE edema which is stable.   Wt Readings from Last 3 Encounters:  08/17/14 121 lb (54.885 kg)  07/23/14 124 lb (56.246 kg)  09/07/13 124 lb (56.246 kg)     Past Medical History  Diagnosis Date  . Arthritis   . Heart murmur   . GERD (gastroesophageal reflux disease)   . Takotsubo cardiomyopathy     EF normalized to 70%  . Old MI (myocardial infarction)   . Back pain   . SIADH (syndrome of inappropriate ADH production)   . Hypertension   . PMR (polymyalgia rheumatica)   . GI bleed due to NSAIDs   . Depression   . Osteoporosis   . Hyperlipidemia   . CKD (chronic kidney disease) stage 3, GFR 30-59 ml/min   . PAF (paroxysmal atrial fibrillation) 2006    not on anticoagulation due to GI bleed  . Ventricular tachycardia     on initial presentation of MI    Current Outpatient Prescriptions  Medication Sig Dispense Refill  . acetaminophen (TYLENOL) 500 MG tablet Take 500 mg by mouth every 6 (six) hours as needed for mild pain.       Marland Kitchen albuterol (PROVENTIL HFA;VENTOLIN HFA) 108 (90 BASE) MCG/ACT inhaler Inhale 1-2  puffs into the lungs every 6 (six) hours as needed for wheezing or shortness of breath.  1 Inhaler  0  . amLODipine (NORVASC) 2.5 MG tablet Take 2.5 mg by mouth daily.      Marland Kitchen aspirin EC 81 MG tablet Take 81 mg by mouth at bedtime.       Marland Kitchen azelastine (ASTELIN) 137 MCG/SPRAY nasal spray Place 2 sprays into the nose 2 (two) times daily.       Marland Kitchen buPROPion (WELLBUTRIN XL) 150 MG 24 hr tablet Take 150 mg by mouth 2 (two) times daily.       . carvedilol (COREG) 25 MG tablet Take 1 tablet (25 mg total) by mouth 2 (two) times daily with a meal.  180 tablet  3  . cetirizine (ZYRTEC) 10 MG tablet Take 10 mg by mouth daily.       . cloNIDine (CATAPRES) 0.1 MG tablet Take 0.1 mg by mouth 2 (two) times daily.       Marland Kitchen demeclocycline (DECLOMYCIN) 150 MG tablet Take 150 mg by mouth 2 (two) times daily.       . diclofenac sodium (VOLTAREN) 1 % GEL Apply 1 application topically 4 (four) times daily as needed. Pain       . fish oil-omega-3  fatty acids 1000 MG capsule Take 1 g by mouth daily.       Marland Kitchen ibuprofen (ADVIL,MOTRIN) 200 MG tablet Take 200 mg by mouth 3 (three) times daily.      . Multiple Vitamins-Minerals (CENTRUM CARDIO PO) Take 1 tablet by mouth 2 (two) times daily.       Marland Kitchen omeprazole (PRILOSEC) 20 MG capsule Take 20 mg by mouth 2 (two) times daily.      . pregabalin (LYRICA) 50 MG capsule Take 50 mg by mouth daily.       . ramipril (ALTACE) 10 MG capsule Take 20 mg by mouth daily after breakfast.       . traMADol (ULTRAM-ER) 300 MG 24 hr tablet Take 300 mg by mouth daily as needed for pain.       Marland Kitchen tiZANidine (ZANAFLEX) 4 MG tablet        No current facility-administered medications for this visit.    Allergies:    Allergies  Allergen Reactions  . Amlodipine Besylate Other (See Comments)    Swelling feet ankles-pt. States is not aware of this allergy!  . Avelox [Moxifloxacin Hcl In Nacl] Other (See Comments)    Pt does not remember   . Duragesic Disc Transdermal System [Alcohol-Fentanyl] Other  (See Comments)    Reaction unknown  . Morphine And Related Other (See Comments)    "My Sister and daughter can,t take it. I've had it before without problems."  . Teriparatide (Recombinant) Other (See Comments)    Made skin dry  . Lidocaine Rash    Social History:  The patient  reports that she has never smoked. She has never used smokeless tobacco. She reports that she does not drink alcohol or use illicit drugs.   Family History:  The patient's family history includes Cancer in her mother.   ROS:  Please see the history of present illness.      All other systems reviewed and negative.   PHYSICAL EXAM: VS:  BP 128/62  Pulse 60  Ht 5\' 3"  (1.6 m)  Wt 121 lb (54.885 kg)  BMI 21.44 kg/m2 Well nourished, well developed, in no acute distress HEENT: normal Neck: no JVD Cardiac:  normal S1, S2; RRR; 2/6 SM at RUSB Lungs:  clear to auscultation bilaterally, no wheezing, rhonchi or rales Abd: soft, nontender, no hepatomegaly Ext: trace ankle edema Skin: warm and dry Neuro:  CNs 2-12 intact, no focal abnormalities noted     ASSESSMENT AND PLAN:  1. Takosubo CM with normalized EF at 70% - continue Carvedilol/ACE I  2. Hypertensive heart disease - BP mildly elevated - continue Carvedilol/amlodipine/clonidine/altace  - I have asked her to check her BP daily for a week and call with results  3. PAF maintaining NSR on medical therapy - not on anticoagulation due to history of GI bleed - continue Carvedilol   Followup with me in 6 months    Signed, Fransico Him, MD [facility] 08/17/2014 4:11 PM

## 2014-08-17 NOTE — Patient Instructions (Signed)
Your physician wants you to follow-up in: 6 months with Dr. Turner. You will receive a reminder letter in the mail two months in advance. If you don't receive a letter, please call our office to schedule the follow-up appointment.  Your physician recommends that you continue on your current medications as directed. Please refer to the Current Medication list given to you today.  

## 2014-08-21 DIAGNOSIS — I480 Paroxysmal atrial fibrillation: Secondary | ICD-10-CM | POA: Diagnosis not present

## 2014-08-21 DIAGNOSIS — N183 Chronic kidney disease, stage 3 (moderate): Secondary | ICD-10-CM | POA: Diagnosis not present

## 2014-08-21 DIAGNOSIS — Z9289 Personal history of other medical treatment: Secondary | ICD-10-CM | POA: Diagnosis not present

## 2014-08-21 DIAGNOSIS — E780 Pure hypercholesterolemia: Secondary | ICD-10-CM | POA: Diagnosis not present

## 2014-08-21 DIAGNOSIS — I129 Hypertensive chronic kidney disease with stage 1 through stage 4 chronic kidney disease, or unspecified chronic kidney disease: Secondary | ICD-10-CM | POA: Diagnosis not present

## 2014-09-11 ENCOUNTER — Ambulatory Visit: Payer: Medicare Other | Admitting: Cardiology

## 2014-09-25 ENCOUNTER — Other Ambulatory Visit: Payer: Self-pay

## 2014-09-25 MED ORDER — AMLODIPINE BESYLATE 2.5 MG PO TABS: 2.5000 mg | ORAL_TABLET | Freq: Every day | ORAL | Status: DC

## 2014-11-29 DIAGNOSIS — K219 Gastro-esophageal reflux disease without esophagitis: Secondary | ICD-10-CM | POA: Diagnosis not present

## 2014-11-29 DIAGNOSIS — Z79899 Other long term (current) drug therapy: Secondary | ICD-10-CM | POA: Diagnosis not present

## 2014-11-29 DIAGNOSIS — E222 Syndrome of inappropriate secretion of antidiuretic hormone: Secondary | ICD-10-CM | POA: Diagnosis not present

## 2014-11-29 DIAGNOSIS — N183 Chronic kidney disease, stage 3 (moderate): Secondary | ICD-10-CM | POA: Diagnosis not present

## 2014-11-29 DIAGNOSIS — M810 Age-related osteoporosis without current pathological fracture: Secondary | ICD-10-CM | POA: Diagnosis not present

## 2014-11-29 DIAGNOSIS — Z0000 Encounter for general adult medical examination without abnormal findings: Secondary | ICD-10-CM | POA: Diagnosis not present

## 2014-11-29 DIAGNOSIS — I129 Hypertensive chronic kidney disease with stage 1 through stage 4 chronic kidney disease, or unspecified chronic kidney disease: Secondary | ICD-10-CM | POA: Diagnosis not present

## 2014-11-29 DIAGNOSIS — Z1389 Encounter for screening for other disorder: Secondary | ICD-10-CM | POA: Diagnosis not present

## 2015-02-15 ENCOUNTER — Ambulatory Visit (INDEPENDENT_AMBULATORY_CARE_PROVIDER_SITE_OTHER): Payer: Medicare Other | Admitting: Cardiology

## 2015-02-15 ENCOUNTER — Encounter: Payer: Self-pay | Admitting: Cardiology

## 2015-02-15 VITALS — BP 118/72 | HR 54 | Ht 63.0 in | Wt 121.0 lb

## 2015-02-15 DIAGNOSIS — I480 Paroxysmal atrial fibrillation: Secondary | ICD-10-CM | POA: Diagnosis not present

## 2015-02-15 DIAGNOSIS — I5181 Takotsubo syndrome: Secondary | ICD-10-CM

## 2015-02-15 DIAGNOSIS — I10 Essential (primary) hypertension: Secondary | ICD-10-CM

## 2015-02-15 LAB — BASIC METABOLIC PANEL
BUN: 34 mg/dL — ABNORMAL HIGH (ref 6–23)
CHLORIDE: 102 meq/L (ref 96–112)
CO2: 29 mEq/L (ref 19–32)
CREATININE: 1.12 mg/dL (ref 0.40–1.20)
Calcium: 9.3 mg/dL (ref 8.4–10.5)
GFR: 48.58 mL/min — ABNORMAL LOW (ref >60.00)
GLUCOSE: 82 mg/dL (ref 70–99)
POTASSIUM: 5.3 meq/L — AB (ref 3.5–5.1)
Sodium: 136 mEq/L (ref 135–145)

## 2015-02-15 NOTE — Progress Notes (Signed)
Cardiology Office Note   Date:  02/15/2015   ID:  [name], DOB 1923/07/31, MRN [id number]  PCP:  Mathews Argyle, MD    Chief Complaint  Patient presents with  . Cardiomyopathy  . Atrial Fibrillation  . Hypertension      History of Present Illness: [name] is a 81 y.o. female with a history of stress induced MI, Takotsubo DM with normalized EF,hypertensive heart disease , PAF who presents today for followup.  She denies any chest pain, SOB, DOE, dizziness, palpitations or syncope. She has chronic LLE edema which is stable.     Past Medical History  Diagnosis Date  . Arthritis   . Heart murmur   . GERD (gastroesophageal reflux disease)   . Takotsubo cardiomyopathy     EF normalized to 70%  . Old MI (myocardial infarction)   . Back pain   . SIADH (syndrome of inappropriate ADH production)   . Hypertension   . PMR (polymyalgia rheumatica)   . GI bleed due to NSAIDs   . Depression   . Osteoporosis   . Hyperlipidemia   . CKD (chronic kidney disease) stage 3, GFR 30-59 ml/min   . PAF (paroxysmal atrial fibrillation) 2006    not on anticoagulation due to GI bleed  . Ventricular tachycardia     on initial presentation of MI    Past Surgical History  Procedure Laterality Date  . Eye surgery  06/10/11    membrane peel   . Rotator cuff repair    . Nasal sinus surgery    . Wisdom tooth extraction    . Cholecystectomy  11/10/2011    Procedure: LAPAROSCOPIC CHOLECYSTECTOMY WITH INTRAOPERATIVE CHOLANGIOGRAM;  Surgeon: Pedro Earls, MD;  Location: [facility];  Service: General;  Laterality: N/A;  . Umbilical hernia repair  11/10/2011    Procedure: HERNIA REPAIR UMBILICAL ADULT;  Surgeon: Pedro Earls, MD;  Location: [facility];  Service: General;  Laterality: N/A;  . Cardiac catheterization      normal coronary arteries     Current Outpatient Prescriptions  Medication Sig Dispense Refill  . acetaminophen (TYLENOL) 500 MG tablet  Take 500 mg by mouth every 6 (six) hours as needed for mild pain.     Marland Kitchen amLODipine (NORVASC) 2.5 MG tablet Take 1 tablet (2.5 mg total) by mouth daily. 30 tablet 6  . aspirin EC 81 MG tablet Take 81 mg by mouth at bedtime.     Marland Kitchen azelastine (ASTELIN) 137 MCG/SPRAY nasal spray Place 2 sprays into the nose 2 (two) times daily.     Marland Kitchen buPROPion (WELLBUTRIN XL) 150 MG 24 hr tablet Take 150 mg by mouth 2 (two) times daily.     . carvedilol (COREG) 25 MG tablet Take 1 tablet (25 mg total) by mouth 2 (two) times daily with a meal. 180 tablet 3  . cetirizine (ZYRTEC) 10 MG tablet Take 10 mg by mouth daily.     . cloNIDine (CATAPRES) 0.1 MG tablet Take 0.1 mg by mouth 2 (two) times daily.     Marland Kitchen demeclocycline (DECLOMYCIN) 150 MG tablet Take 150 mg by mouth 2 (two) times daily.     . diclofenac sodium (VOLTAREN) 1 % GEL Apply 1 application topically 4 (four) times daily as needed. Pain     . fish oil-omega-3 fatty acids 1000 MG capsule Take 1 g by mouth daily.     Marland Kitchen ibuprofen (ADVIL,MOTRIN) 200 MG tablet Take 200 mg by mouth 3 (three)  times daily.    . Multiple Vitamins-Minerals (CENTRUM CARDIO PO) Take 1 tablet by mouth 2 (two) times daily.     Marland Kitchen omeprazole (PRILOSEC) 20 MG capsule Take 20 mg by mouth 2 (two) times daily.    . pregabalin (LYRICA) 50 MG capsule Take 50 mg by mouth daily.     . ramipril (ALTACE) 10 MG capsule Take 20 mg by mouth daily after breakfast.     . tiZANidine (ZANAFLEX) 4 MG tablet     . traMADol (ULTRAM-ER) 300 MG 24 hr tablet Take 300 mg by mouth daily as needed for pain.      No current facility-administered medications for this visit.    Allergies:   Amlodipine besylate; Avelox; Duragesic disc transdermal system; Morphine and related; Teriparatide (recombinant); and Lidocaine    Social History:  The patient  reports that she has never smoked. She has never used smokeless tobacco. She reports that she does not drink alcohol or use illicit drugs.   Family History:  The  patient's family history includes Cancer in her mother.    ROS:  Please see the history of present illness.   Otherwise, review of systems are positive for none.   All other systems are reviewed and negative.    PHYSICAL EXAM: VS:  BP 118/72 mmHg  Pulse 54  Ht 5\' 3"  (1.6 m)  Wt 121 lb (54.885 kg)  BMI 21.44 kg/m2 , BMI Body mass index is 21.44 kg/(m^2). GEN: Well nourished, well developed, in no acute distress HEENT: normal Neck: no JVD, carotid bruits, or masses Cardiac: RRR; no murmurs, rubs, or gallops,no edema  Respiratory:  clear to auscultation bilaterally, normal work of breathing GI: soft, nontender, nondistended, + BS MS: no deformity or atrophy Skin: warm and dry, no rash Neuro:  Strength and sensation are intact Psych: euthymic mood, full affect   EKG:  EKG is not ordered today.    Recent Labs: 07/23/2014: ALT 13; BUN 16; Creatinine 0.87; Hemoglobin 10.5*; Platelets 300; Potassium 4.7; Pro B Natriuretic peptide (BNP) 709.9*; Sodium 132*    Lipid Panel No results found for: CHOL, TRIG, HDL, CHOLHDL, VLDL, LDLCALC, LDLDIRECT    Wt Readings from Last 3 Encounters:  02/15/15 121 lb (54.885 kg)  08/17/14 121 lb (54.885 kg)  07/23/14 124 lb (56.246 kg)    ASSESSMENT AND PLAN:  1. Takosubo CM with normalized EF at 70% - continue Carvedilol/ACE I  2. Hypertensive heart disease - controlled - continue Carvedilol/amlodipine/clonidine/altace  3. PAF maintaining NSR on medical therapy - not on anticoagulation due to history of GI bleed - continue Carvedilol    Current medicines are reviewed at length with the patient today.  The patient does not have concerns regarding medicines.  The following changes have been made:  no change  Labs/ tests ordered today include: see above assessment and plan No orders of the defined types were placed in this encounter.     Disposition:   FU with me in 6 months   Signed, [name], MD  02/15/2015 11:15 AM    [facility] [address], [place], [place]  [address] Phone: [phone]; Fax: [phone]

## 2015-02-15 NOTE — Patient Instructions (Addendum)

## 2015-02-19 ENCOUNTER — Telehealth: Payer: Self-pay

## 2015-02-19 DIAGNOSIS — I10 Essential (primary) hypertension: Secondary | ICD-10-CM

## 2015-02-19 NOTE — Telephone Encounter (Signed)
BMET scheduled for Monday, 5/9.  Patient agrees with treatment plan.

## 2015-02-19 NOTE — Telephone Encounter (Signed)
-----   Message from [name], MD sent at 02/16/2015  5:16 PM EDT ----- Repeat BMET in 1 week

## 2015-02-22 ENCOUNTER — Other Ambulatory Visit: Payer: Self-pay

## 2015-02-22 MED ORDER — AMLODIPINE BESYLATE 2.5 MG PO TABS: 2.5000 mg | ORAL_TABLET | Freq: Every day | ORAL | Status: DC

## 2015-02-25 ENCOUNTER — Other Ambulatory Visit (INDEPENDENT_AMBULATORY_CARE_PROVIDER_SITE_OTHER): Payer: Medicare Other | Admitting: *Deleted

## 2015-02-25 DIAGNOSIS — I10 Essential (primary) hypertension: Secondary | ICD-10-CM

## 2015-02-25 LAB — BASIC METABOLIC PANEL
BUN: 25 mg/dL — ABNORMAL HIGH (ref 6–23)
CO2: 28 mEq/L (ref 19–32)
Calcium: 9.1 mg/dL (ref 8.4–10.5)
Chloride: 101 mEq/L (ref 96–112)
Creatinine, Ser: 1.06 mg/dL (ref 0.40–1.20)
GFR: 51.76 mL/min — AB (ref >60.00)
Glucose, Bld: 97 mg/dL (ref 70–99)
Potassium: 5 mEq/L (ref 3.5–5.1)
SODIUM: 134 meq/L — AB (ref 135–145)

## 2015-05-28 DIAGNOSIS — N183 Chronic kidney disease, stage 3 (moderate): Secondary | ICD-10-CM | POA: Diagnosis not present

## 2015-05-28 DIAGNOSIS — I129 Hypertensive chronic kidney disease with stage 1 through stage 4 chronic kidney disease, or unspecified chronic kidney disease: Secondary | ICD-10-CM | POA: Diagnosis not present

## 2015-05-28 DIAGNOSIS — Z79899 Other long term (current) drug therapy: Secondary | ICD-10-CM | POA: Diagnosis not present

## 2015-05-28 DIAGNOSIS — E222 Syndrome of inappropriate secretion of antidiuretic hormone: Secondary | ICD-10-CM | POA: Diagnosis not present

## 2015-05-28 DIAGNOSIS — I4891 Unspecified atrial fibrillation: Secondary | ICD-10-CM | POA: Diagnosis not present

## 2015-06-25 ENCOUNTER — Other Ambulatory Visit: Payer: Self-pay | Admitting: Cardiology

## 2015-07-16 DIAGNOSIS — Z23 Encounter for immunization: Secondary | ICD-10-CM | POA: Diagnosis not present

## 2015-08-28 ENCOUNTER — Encounter: Payer: Self-pay | Admitting: Cardiology

## 2015-08-28 ENCOUNTER — Telehealth: Payer: Self-pay | Admitting: Cardiology

## 2015-08-28 ENCOUNTER — Ambulatory Visit (INDEPENDENT_AMBULATORY_CARE_PROVIDER_SITE_OTHER): Payer: Medicare Other | Admitting: Cardiology

## 2015-08-28 VITALS — BP 140/64 | HR 55 | Ht 63.0 in | Wt 122.2 lb

## 2015-08-28 DIAGNOSIS — R001 Bradycardia, unspecified: Secondary | ICD-10-CM

## 2015-08-28 DIAGNOSIS — I10 Essential (primary) hypertension: Secondary | ICD-10-CM

## 2015-08-28 DIAGNOSIS — I5181 Takotsubo syndrome: Secondary | ICD-10-CM

## 2015-08-28 DIAGNOSIS — I480 Paroxysmal atrial fibrillation: Secondary | ICD-10-CM

## 2015-08-28 HISTORY — DX: Bradycardia, unspecified: R00.1

## 2015-08-28 LAB — BASIC METABOLIC PANEL
BUN: 30 mg/dL — ABNORMAL HIGH (ref 7–25)
CHLORIDE: 103 mmol/L (ref 98–110)
CO2: 25 mmol/L (ref 20–31)
CREATININE: 1.18 mg/dL — AB (ref 0.60–0.88)
Calcium: 9 mg/dL (ref 8.6–10.4)
Glucose, Bld: 89 mg/dL (ref 65–99)
POTASSIUM: 4.9 mmol/L (ref 3.5–5.3)
Sodium: 136 mmol/L (ref 135–146)

## 2015-08-28 MED ORDER — AMLODIPINE BESYLATE 5 MG PO TABS: 5.0000 mg | ORAL_TABLET | Freq: Every day | ORAL | Status: DC

## 2015-08-28 MED ORDER — CARVEDILOL 12.5 MG PO TABS: 12.5000 mg | ORAL_TABLET | Freq: Two times a day (BID) | ORAL | Status: DC

## 2015-08-28 NOTE — Telephone Encounter (Signed)
Med list updated

## 2015-08-28 NOTE — Patient Instructions (Signed)
Medication Instructions:  Your physician has recommended you make the following change in your medication: 1) INCREASE AMLODIPINE to 5 mg daily 2) DECREASE COREG to 12.5 mg TWICE DAILY  Labwork: TODAY: BMET  Testing/Procedures: Your physician has requested that you have an echocardiogram. Echocardiography is a painless test that uses sound waves to create images of your heart. It provides your doctor with information about the size and shape of your heart and how well your heart's chambers and valves are working. This procedure takes approximately one hour. There are no restrictions for this procedure.  Follow-Up: Your physician recommends that you schedule a follow-up appointment in 2 weeks with the [facility] for a blood pressure check.  Your physician wants you to follow-up in: 6 months with Dr. Radford Pax. You will receive a reminder letter in the mail two months in advance. If you don't receive a letter, please call our office to schedule the follow-up appointment.   Any Other Special Instructions Will Be Listed Below (If Applicable).     If you need a refill on your cardiac medications before your next appointment, please call your pharmacy.

## 2015-08-28 NOTE — Progress Notes (Signed)
Cardiology Office Note   Date:  08/28/2015   ID:  [name], DOB [birth year], MRN [id number]  PCP:  Mathews Argyle, MD    Chief Complaint  Patient presents with  . Atrial Fibrillation  . Hypertension      History of Present Illness: [name] is a 81yo WF  wth history of stress induced MI, Takotsubo DCM with normalized EF, hypertensive heart disease , PAF who presents today for followup.  She denies any chest pain, SOB, DOE, palpitations or syncope. She has chronic LLE edema which is stable.  She has had several episodes where she could not move and function but only when getting out of a car and would have to stop.  This occurred when it was very hot out.  Past Medical History  Diagnosis Date  . Arthritis   . Heart murmur   . GERD (gastroesophageal reflux disease)   . Takotsubo cardiomyopathy     EF normalized to 70%  . Old MI (myocardial infarction)   . Back pain   . SIADH (syndrome of inappropriate ADH production) ([place])   . Hypertension   . PMR (polymyalgia rheumatica) ([facility])   . GI bleed due to NSAIDs   . Depression   . Osteoporosis   . Hyperlipidemia   . CKD (chronic kidney disease) stage 3, GFR 30-59 ml/min   . PAF (paroxysmal atrial fibrillation) ([place]) 2006    not on anticoagulation due to GI bleed  . Ventricular tachycardia ([place])     on initial presentation of MI    Past Surgical History  Procedure Laterality Date  . Eye surgery  06/10/11    membrane peel   . Rotator cuff repair    . Nasal sinus surgery    . Wisdom tooth extraction    . Cholecystectomy  11/10/2011    Procedure: LAPAROSCOPIC CHOLECYSTECTOMY WITH INTRAOPERATIVE CHOLANGIOGRAM;  Surgeon: Pedro Earls, MD;  Location: [facility];  Service: General;  Laterality: N/A;  . Umbilical hernia repair  11/10/2011    Procedure: HERNIA REPAIR UMBILICAL ADULT;  Surgeon: Pedro Earls, MD;  Location: [facility];  Service: General;  Laterality: N/A;  .  Cardiac catheterization      normal coronary arteries     Current Outpatient Prescriptions  Medication Sig Dispense Refill  . acetaminophen (TYLENOL) 500 MG tablet Take 500 mg by mouth every 6 (six) hours as needed for mild pain.     Marland Kitchen amLODipine (NORVASC) 2.5 MG tablet Take 1 tablet (2.5 mg total) by mouth daily. 90 tablet 1  . aspirin EC 81 MG tablet Take 81 mg by mouth at bedtime.     Marland Kitchen azelastine (ASTELIN) 137 MCG/SPRAY nasal spray Place 2 sprays into the nose 2 (two) times daily.     Marland Kitchen buPROPion (WELLBUTRIN XL) 150 MG 24 hr tablet Take 150 mg by mouth 2 (two) times daily.     . carvedilol (COREG) 25 MG tablet TAKE 1 TABLET TWICE A DAY WITH MEALS 180 tablet 0  . cetirizine (ZYRTEC) 10 MG tablet Take 10 mg by mouth daily.     Marland Kitchen demeclocycline (DECLOMYCIN) 150 MG tablet Take 150 mg by mouth 2 (two) times daily.     . diclofenac sodium (VOLTAREN) 1 % GEL Apply 1 application topically 4 (four) times daily as needed. Pain     . fish oil-omega-3 fatty acids 1000 MG capsule Take 1 g  by mouth daily.     Marland Kitchen FLUZONE HIGH-DOSE 0.5 ML SUSY FOR RPH ADMIN  0  . ibuprofen (ADVIL,MOTRIN) 200 MG tablet Take 200 mg by mouth 3 (three) times daily.    . Multiple Vitamins-Minerals (CENTRUM CARDIO PO) Take 1 tablet by mouth 2 (two) times daily.     Marland Kitchen omeprazole (PRILOSEC) 20 MG capsule Take 20 mg by mouth daily.     . pregabalin (LYRICA) 50 MG capsule Take 50 mg by mouth daily.     . ramipril (ALTACE) 10 MG capsule Take 20 mg by mouth daily after breakfast.     . tiZANidine (ZANAFLEX) 4 MG tablet     . traMADol (ULTRAM-ER) 300 MG 24 hr tablet Take 300 mg by mouth daily as needed for pain.      No current facility-administered medications for this visit.    Allergies:   Amlodipine besylate; Avelox; Duragesic disc transdermal system; Morphine and related; Teriparatide (recombinant); and Lidocaine    Social History:  The patient  reports that she has never smoked. She has never used smokeless tobacco. She  reports that she does not drink alcohol or use illicit drugs.   Family History:  The patient's family history includes Cancer in her mother.    ROS:  Please see the history of present illness.   Otherwise, review of systems are positive for none.   All other systems are reviewed and negative.    PHYSICAL EXAM: VS:  BP 140/64 mmHg  Pulse 55  Ht 5\' 3"  (1.6 m)  Wt 122 lb 3.2 oz (55.43 kg)  BMI 21.65 kg/m2 , BMI Body mass index is 21.65 kg/(m^2). GEN: Well nourished, well developed, in no acute distress HEENT: normal Neck: no JVD, carotid bruits, or masses Cardiac: RRR; no rubs, or gallops,no edema.  2/6 SM at RUSB to LLSB Respiratory:  clear to auscultation bilaterally, normal work of breathing GI: soft, nontender, nondistended, + BS MS: no deformity or atrophy Skin: warm and dry, no rash Neuro:  Strength and sensation are intact Psych: euthymic mood, full affect   EKG:  EKG was ordered today showing sinus bradycardia at 55bpm with no ST changes    Recent Labs: 02/25/2015: BUN 25*; Creatinine, Ser 1.06; Potassium 5.0; Sodium 134*    Lipid Panel No results found for: CHOL, TRIG, HDL, CHOLHDL, VLDL, LDLCALC, LDLDIRECT    Wt Readings from Last 3 Encounters:  08/28/15 122 lb 3.2 oz (55.43 kg)  02/15/15 121 lb (54.885 kg)  08/17/14 121 lb (54.885 kg)    ASSESSMENT AND PLAN:  1. Takosubo CM with normalized EF at 70% - continue Carvedilol/ACE I  2. Hypertensive heart disease - controlled - continue ACE I - decrease coreg to 12.5mg  BID due to bradycardia - Increase amlodipine to 5mg  daily 3. PAF maintaining NSR on medical therapy - not on anticoagulation due to history of GI bleed - decrease  Carvedilol to 12.5mg  BID due to bradycardia       4. Heart murmur - check 2D echo   Current medicines are reviewed at length with the patient today.  The patient does not have concerns regarding medicines.  The following changes have been made:  no change  Labs/ tests ordered today:  See above Assessment and Plan No orders of the defined types were placed in this encounter.     Disposition:   FU with me in 6 months  Signed, [name], MD  08/28/2015 10:26 AM     [facility]  [address], [place], [place]  [address] Phone: [phone]; Fax: [phone]    

## 2015-08-28 NOTE — Telephone Encounter (Signed)
New message      Calling to let Dr Radford Pax know pt is not taking tizanidine 4mg .  Pt was seen today

## 2015-09-11 ENCOUNTER — Other Ambulatory Visit: Payer: Self-pay

## 2015-09-11 ENCOUNTER — Ambulatory Visit (HOSPITAL_COMMUNITY): Payer: Medicare Other | Attending: Cardiology

## 2015-09-11 ENCOUNTER — Encounter: Payer: Medicare Other | Admitting: Pharmacist

## 2015-09-11 DIAGNOSIS — I480 Paroxysmal atrial fibrillation: Secondary | ICD-10-CM | POA: Diagnosis not present

## 2015-09-11 DIAGNOSIS — I517 Cardiomegaly: Secondary | ICD-10-CM | POA: Diagnosis not present

## 2015-09-11 DIAGNOSIS — I340 Nonrheumatic mitral (valve) insufficiency: Secondary | ICD-10-CM | POA: Diagnosis not present

## 2015-09-11 DIAGNOSIS — I350 Nonrheumatic aortic (valve) stenosis: Secondary | ICD-10-CM | POA: Insufficient documentation

## 2015-09-11 DIAGNOSIS — I5181 Takotsubo syndrome: Secondary | ICD-10-CM | POA: Insufficient documentation

## 2015-09-11 DIAGNOSIS — I10 Essential (primary) hypertension: Secondary | ICD-10-CM | POA: Insufficient documentation

## 2015-09-11 DIAGNOSIS — I341 Nonrheumatic mitral (valve) prolapse: Secondary | ICD-10-CM | POA: Insufficient documentation

## 2015-09-11 DIAGNOSIS — I4891 Unspecified atrial fibrillation: Secondary | ICD-10-CM | POA: Diagnosis not present

## 2015-09-11 NOTE — Progress Notes (Deleted)
Cardiology Office Note   Date:  09/11/2015   ID:  [name], DOB 15-Apr-1923, MRN [id number]  PCP:  Mathews Argyle, MD    Chief Complaint  Patient presents with  . Hypertension      History of Present Illness: [name] is a 81 yo pt of Dr. Radford Pax who is seen today for blood pressure follow up.  She has a PMH significant for stress induced MI, Takotsubo DCM with normalized EF, hypertensive heart disease , and  PAF.  At her visit on 11/9 with Dr. Radford Pax, her blood pressure was well controlled but her heart rate was low.  She decreased her carvediolol and increased amlodipine at that time.    BP goal < 140/80 due to CKD  Past Medical History  Diagnosis Date  . Arthritis   . Heart murmur   . GERD (gastroesophageal reflux disease)   . Takotsubo cardiomyopathy     EF normalized to 70%  . Old MI (myocardial infarction)   . Back pain   . SIADH (syndrome of inappropriate ADH production) ([place])   . Hypertension   . PMR (polymyalgia rheumatica) ([facility])   . GI bleed due to NSAIDs   . Depression   . Osteoporosis   . Hyperlipidemia   . CKD (chronic kidney disease) stage 3, GFR 30-59 ml/min   . PAF (paroxysmal atrial fibrillation) ([place]) 2006    not on anticoagulation due to GI bleed  . Ventricular tachycardia ([place])     on initial presentation of MI  . Bradycardia 08/28/2015     Current Outpatient Prescriptions  Medication Sig Dispense Refill  . acetaminophen (TYLENOL) 500 MG tablet Take 500 mg by mouth every 6 (six) hours as needed for mild pain.     Marland Kitchen amLODipine (NORVASC) 5 MG tablet Take 1 tablet (5 mg total) by mouth daily. 90 tablet 3  . aspirin EC 81 MG tablet Take 81 mg by mouth at bedtime.     Marland Kitchen azelastine (ASTELIN) 137 MCG/SPRAY nasal spray Place 2 sprays into the nose 2 (two) times daily.     Marland Kitchen buPROPion (WELLBUTRIN XL) 150 MG 24 hr tablet Take 150 mg by mouth 2 (two) times daily.     . carvedilol (COREG) 12.5 MG tablet  Take 1 tablet (12.5 mg total) by mouth 2 (two) times daily with a meal. 180 tablet 3  . cetirizine (ZYRTEC) 10 MG tablet Take 10 mg by mouth daily.     Marland Kitchen demeclocycline (DECLOMYCIN) 150 MG tablet Take 150 mg by mouth 2 (two) times daily.     . diclofenac sodium (VOLTAREN) 1 % GEL Apply 1 application topically 4 (four) times daily as needed. Pain     . fish oil-omega-3 fatty acids 1000 MG capsule Take 1 g by mouth daily.     Marland Kitchen FLUZONE HIGH-DOSE 0.5 ML SUSY FOR RPH ADMIN  0  . ibuprofen (ADVIL,MOTRIN) 200 MG tablet Take 200 mg by mouth 3 (three) times daily.    . Multiple Vitamins-Minerals (CENTRUM CARDIO PO) Take 1 tablet by mouth 2 (two) times daily.     Marland Kitchen omeprazole (PRILOSEC) 20 MG capsule Take 20 mg by mouth daily.     . pregabalin (LYRICA) 50 MG capsule Take 50 mg by mouth daily.     . ramipril (ALTACE) 10 MG capsule Take 20 mg by mouth daily after breakfast.     . traMADol (  ULTRAM-ER) 300 MG 24 hr tablet Take 300 mg by mouth daily as needed for pain.      No current facility-administered medications for this visit.    Allergies:   Avelox; Duragesic disc transdermal system; Morphine and related; Teriparatide (recombinant); and Lidocaine     ASSESSMENT AND PLAN:  1.  Hypertension  Weston Brass [facility]  09/11/2015 11:03 AM    [address], [place], [place]  [address] Phone: [phone]; Fax: [phone]

## 2015-09-11 NOTE — Progress Notes (Signed)
This encounter was created in error - please disregard.

## 2015-10-09 ENCOUNTER — Other Ambulatory Visit: Payer: Self-pay | Admitting: Cardiology

## 2015-10-12 DIAGNOSIS — J069 Acute upper respiratory infection, unspecified: Secondary | ICD-10-CM | POA: Diagnosis not present

## 2015-10-31 DIAGNOSIS — M25572 Pain in left ankle and joints of left foot: Secondary | ICD-10-CM | POA: Diagnosis not present

## 2015-11-05 DIAGNOSIS — R29898 Other symptoms and signs involving the musculoskeletal system: Secondary | ICD-10-CM | POA: Diagnosis not present

## 2015-11-05 DIAGNOSIS — R2689 Other abnormalities of gait and mobility: Secondary | ICD-10-CM | POA: Diagnosis not present

## 2015-11-05 DIAGNOSIS — M6281 Muscle weakness (generalized): Secondary | ICD-10-CM | POA: Diagnosis not present

## 2015-11-05 DIAGNOSIS — Z9181 History of falling: Secondary | ICD-10-CM | POA: Diagnosis not present

## 2015-11-05 DIAGNOSIS — M150 Primary generalized (osteo)arthritis: Secondary | ICD-10-CM | POA: Diagnosis not present

## 2015-11-06 DIAGNOSIS — I255 Ischemic cardiomyopathy: Secondary | ICD-10-CM | POA: Diagnosis not present

## 2015-11-06 DIAGNOSIS — E871 Hypo-osmolality and hyponatremia: Secondary | ICD-10-CM | POA: Diagnosis not present

## 2015-11-06 DIAGNOSIS — F3289 Other specified depressive episodes: Secondary | ICD-10-CM | POA: Diagnosis not present

## 2015-11-06 DIAGNOSIS — M6281 Muscle weakness (generalized): Secondary | ICD-10-CM | POA: Diagnosis not present

## 2015-11-06 DIAGNOSIS — M818 Other osteoporosis without current pathological fracture: Secondary | ICD-10-CM | POA: Diagnosis not present

## 2015-11-06 DIAGNOSIS — Z9181 History of falling: Secondary | ICD-10-CM | POA: Diagnosis not present

## 2015-11-06 DIAGNOSIS — R2689 Other abnormalities of gait and mobility: Secondary | ICD-10-CM | POA: Diagnosis not present

## 2015-11-06 DIAGNOSIS — R4189 Other symptoms and signs involving cognitive functions and awareness: Secondary | ICD-10-CM | POA: Diagnosis not present

## 2015-11-06 DIAGNOSIS — R29898 Other symptoms and signs involving the musculoskeletal system: Secondary | ICD-10-CM | POA: Diagnosis not present

## 2015-11-06 DIAGNOSIS — M545 Low back pain: Secondary | ICD-10-CM | POA: Diagnosis not present

## 2015-11-07 DIAGNOSIS — R29898 Other symptoms and signs involving the musculoskeletal system: Secondary | ICD-10-CM | POA: Diagnosis not present

## 2015-11-07 DIAGNOSIS — R2689 Other abnormalities of gait and mobility: Secondary | ICD-10-CM | POA: Diagnosis not present

## 2015-11-07 DIAGNOSIS — I255 Ischemic cardiomyopathy: Secondary | ICD-10-CM | POA: Diagnosis not present

## 2015-11-07 DIAGNOSIS — Z9181 History of falling: Secondary | ICD-10-CM | POA: Diagnosis not present

## 2015-11-07 DIAGNOSIS — M6281 Muscle weakness (generalized): Secondary | ICD-10-CM | POA: Diagnosis not present

## 2015-11-07 DIAGNOSIS — R4189 Other symptoms and signs involving cognitive functions and awareness: Secondary | ICD-10-CM | POA: Diagnosis not present

## 2015-11-08 DIAGNOSIS — R29898 Other symptoms and signs involving the musculoskeletal system: Secondary | ICD-10-CM | POA: Diagnosis not present

## 2015-11-08 DIAGNOSIS — Z9181 History of falling: Secondary | ICD-10-CM | POA: Diagnosis not present

## 2015-11-08 DIAGNOSIS — R2689 Other abnormalities of gait and mobility: Secondary | ICD-10-CM | POA: Diagnosis not present

## 2015-11-08 DIAGNOSIS — M6281 Muscle weakness (generalized): Secondary | ICD-10-CM | POA: Diagnosis not present

## 2015-11-08 DIAGNOSIS — R4189 Other symptoms and signs involving cognitive functions and awareness: Secondary | ICD-10-CM | POA: Diagnosis not present

## 2015-11-08 DIAGNOSIS — I255 Ischemic cardiomyopathy: Secondary | ICD-10-CM | POA: Diagnosis not present

## 2015-11-11 DIAGNOSIS — M6281 Muscle weakness (generalized): Secondary | ICD-10-CM | POA: Diagnosis not present

## 2015-11-11 DIAGNOSIS — R29898 Other symptoms and signs involving the musculoskeletal system: Secondary | ICD-10-CM | POA: Diagnosis not present

## 2015-11-11 DIAGNOSIS — Z9181 History of falling: Secondary | ICD-10-CM | POA: Diagnosis not present

## 2015-11-11 DIAGNOSIS — R4189 Other symptoms and signs involving cognitive functions and awareness: Secondary | ICD-10-CM | POA: Diagnosis not present

## 2015-11-11 DIAGNOSIS — I255 Ischemic cardiomyopathy: Secondary | ICD-10-CM | POA: Diagnosis not present

## 2015-11-11 DIAGNOSIS — R2689 Other abnormalities of gait and mobility: Secondary | ICD-10-CM | POA: Diagnosis not present

## 2015-11-12 DIAGNOSIS — R2689 Other abnormalities of gait and mobility: Secondary | ICD-10-CM | POA: Diagnosis not present

## 2015-11-12 DIAGNOSIS — I255 Ischemic cardiomyopathy: Secondary | ICD-10-CM | POA: Diagnosis not present

## 2015-11-12 DIAGNOSIS — R4189 Other symptoms and signs involving cognitive functions and awareness: Secondary | ICD-10-CM | POA: Diagnosis not present

## 2015-11-12 DIAGNOSIS — Z9181 History of falling: Secondary | ICD-10-CM | POA: Diagnosis not present

## 2015-11-12 DIAGNOSIS — R29898 Other symptoms and signs involving the musculoskeletal system: Secondary | ICD-10-CM | POA: Diagnosis not present

## 2015-11-12 DIAGNOSIS — M6281 Muscle weakness (generalized): Secondary | ICD-10-CM | POA: Diagnosis not present

## 2015-11-13 DIAGNOSIS — R4189 Other symptoms and signs involving cognitive functions and awareness: Secondary | ICD-10-CM | POA: Diagnosis not present

## 2015-11-13 DIAGNOSIS — Z9181 History of falling: Secondary | ICD-10-CM | POA: Diagnosis not present

## 2015-11-13 DIAGNOSIS — R29898 Other symptoms and signs involving the musculoskeletal system: Secondary | ICD-10-CM | POA: Diagnosis not present

## 2015-11-13 DIAGNOSIS — I255 Ischemic cardiomyopathy: Secondary | ICD-10-CM | POA: Diagnosis not present

## 2015-11-13 DIAGNOSIS — R2689 Other abnormalities of gait and mobility: Secondary | ICD-10-CM | POA: Diagnosis not present

## 2015-11-13 DIAGNOSIS — M6281 Muscle weakness (generalized): Secondary | ICD-10-CM | POA: Diagnosis not present

## 2015-11-15 DIAGNOSIS — R29898 Other symptoms and signs involving the musculoskeletal system: Secondary | ICD-10-CM | POA: Diagnosis not present

## 2015-11-15 DIAGNOSIS — M6281 Muscle weakness (generalized): Secondary | ICD-10-CM | POA: Diagnosis not present

## 2015-11-15 DIAGNOSIS — Z9181 History of falling: Secondary | ICD-10-CM | POA: Diagnosis not present

## 2015-11-15 DIAGNOSIS — R2689 Other abnormalities of gait and mobility: Secondary | ICD-10-CM | POA: Diagnosis not present

## 2015-11-15 DIAGNOSIS — I255 Ischemic cardiomyopathy: Secondary | ICD-10-CM | POA: Diagnosis not present

## 2015-11-15 DIAGNOSIS — R4189 Other symptoms and signs involving cognitive functions and awareness: Secondary | ICD-10-CM | POA: Diagnosis not present

## 2015-11-18 DIAGNOSIS — I255 Ischemic cardiomyopathy: Secondary | ICD-10-CM | POA: Diagnosis not present

## 2015-11-18 DIAGNOSIS — M6281 Muscle weakness (generalized): Secondary | ICD-10-CM | POA: Diagnosis not present

## 2015-11-18 DIAGNOSIS — R2689 Other abnormalities of gait and mobility: Secondary | ICD-10-CM | POA: Diagnosis not present

## 2015-11-18 DIAGNOSIS — Z9181 History of falling: Secondary | ICD-10-CM | POA: Diagnosis not present

## 2015-11-18 DIAGNOSIS — R4189 Other symptoms and signs involving cognitive functions and awareness: Secondary | ICD-10-CM | POA: Diagnosis not present

## 2015-11-18 DIAGNOSIS — R29898 Other symptoms and signs involving the musculoskeletal system: Secondary | ICD-10-CM | POA: Diagnosis not present

## 2015-11-19 DIAGNOSIS — I255 Ischemic cardiomyopathy: Secondary | ICD-10-CM | POA: Diagnosis not present

## 2015-11-19 DIAGNOSIS — Z9181 History of falling: Secondary | ICD-10-CM | POA: Diagnosis not present

## 2015-11-19 DIAGNOSIS — R29898 Other symptoms and signs involving the musculoskeletal system: Secondary | ICD-10-CM | POA: Diagnosis not present

## 2015-11-19 DIAGNOSIS — R4189 Other symptoms and signs involving cognitive functions and awareness: Secondary | ICD-10-CM | POA: Diagnosis not present

## 2015-11-19 DIAGNOSIS — R2689 Other abnormalities of gait and mobility: Secondary | ICD-10-CM | POA: Diagnosis not present

## 2015-11-19 DIAGNOSIS — M6281 Muscle weakness (generalized): Secondary | ICD-10-CM | POA: Diagnosis not present

## 2015-11-21 DIAGNOSIS — M545 Low back pain: Secondary | ICD-10-CM | POA: Diagnosis not present

## 2015-11-21 DIAGNOSIS — I255 Ischemic cardiomyopathy: Secondary | ICD-10-CM | POA: Diagnosis not present

## 2015-11-21 DIAGNOSIS — Z9181 History of falling: Secondary | ICD-10-CM | POA: Diagnosis not present

## 2015-11-21 DIAGNOSIS — E871 Hypo-osmolality and hyponatremia: Secondary | ICD-10-CM | POA: Diagnosis not present

## 2015-11-21 DIAGNOSIS — M818 Other osteoporosis without current pathological fracture: Secondary | ICD-10-CM | POA: Diagnosis not present

## 2015-11-21 DIAGNOSIS — R4189 Other symptoms and signs involving cognitive functions and awareness: Secondary | ICD-10-CM | POA: Diagnosis not present

## 2015-11-21 DIAGNOSIS — F3289 Other specified depressive episodes: Secondary | ICD-10-CM | POA: Diagnosis not present

## 2015-11-21 DIAGNOSIS — R2689 Other abnormalities of gait and mobility: Secondary | ICD-10-CM | POA: Diagnosis not present

## 2015-11-21 DIAGNOSIS — M6281 Muscle weakness (generalized): Secondary | ICD-10-CM | POA: Diagnosis not present

## 2015-11-21 DIAGNOSIS — R29898 Other symptoms and signs involving the musculoskeletal system: Secondary | ICD-10-CM | POA: Diagnosis not present

## 2015-11-25 DIAGNOSIS — Z9181 History of falling: Secondary | ICD-10-CM | POA: Diagnosis not present

## 2015-11-25 DIAGNOSIS — R4189 Other symptoms and signs involving cognitive functions and awareness: Secondary | ICD-10-CM | POA: Diagnosis not present

## 2015-11-25 DIAGNOSIS — R29898 Other symptoms and signs involving the musculoskeletal system: Secondary | ICD-10-CM | POA: Diagnosis not present

## 2015-11-25 DIAGNOSIS — M6281 Muscle weakness (generalized): Secondary | ICD-10-CM | POA: Diagnosis not present

## 2015-11-25 DIAGNOSIS — I255 Ischemic cardiomyopathy: Secondary | ICD-10-CM | POA: Diagnosis not present

## 2015-11-25 DIAGNOSIS — R2689 Other abnormalities of gait and mobility: Secondary | ICD-10-CM | POA: Diagnosis not present

## 2015-11-27 DIAGNOSIS — R4189 Other symptoms and signs involving cognitive functions and awareness: Secondary | ICD-10-CM | POA: Diagnosis not present

## 2015-11-27 DIAGNOSIS — Z9181 History of falling: Secondary | ICD-10-CM | POA: Diagnosis not present

## 2015-11-27 DIAGNOSIS — R2689 Other abnormalities of gait and mobility: Secondary | ICD-10-CM | POA: Diagnosis not present

## 2015-11-27 DIAGNOSIS — R29898 Other symptoms and signs involving the musculoskeletal system: Secondary | ICD-10-CM | POA: Diagnosis not present

## 2015-11-27 DIAGNOSIS — I255 Ischemic cardiomyopathy: Secondary | ICD-10-CM | POA: Diagnosis not present

## 2015-11-27 DIAGNOSIS — M6281 Muscle weakness (generalized): Secondary | ICD-10-CM | POA: Diagnosis not present

## 2015-11-29 DIAGNOSIS — R29898 Other symptoms and signs involving the musculoskeletal system: Secondary | ICD-10-CM | POA: Diagnosis not present

## 2015-11-29 DIAGNOSIS — R2689 Other abnormalities of gait and mobility: Secondary | ICD-10-CM | POA: Diagnosis not present

## 2015-11-29 DIAGNOSIS — R4189 Other symptoms and signs involving cognitive functions and awareness: Secondary | ICD-10-CM | POA: Diagnosis not present

## 2015-11-29 DIAGNOSIS — I255 Ischemic cardiomyopathy: Secondary | ICD-10-CM | POA: Diagnosis not present

## 2015-11-29 DIAGNOSIS — M6281 Muscle weakness (generalized): Secondary | ICD-10-CM | POA: Diagnosis not present

## 2015-11-29 DIAGNOSIS — Z9181 History of falling: Secondary | ICD-10-CM | POA: Diagnosis not present

## 2015-12-02 DIAGNOSIS — R29898 Other symptoms and signs involving the musculoskeletal system: Secondary | ICD-10-CM | POA: Diagnosis not present

## 2015-12-02 DIAGNOSIS — Z9181 History of falling: Secondary | ICD-10-CM | POA: Diagnosis not present

## 2015-12-02 DIAGNOSIS — R2689 Other abnormalities of gait and mobility: Secondary | ICD-10-CM | POA: Diagnosis not present

## 2015-12-02 DIAGNOSIS — M6281 Muscle weakness (generalized): Secondary | ICD-10-CM | POA: Diagnosis not present

## 2015-12-02 DIAGNOSIS — I255 Ischemic cardiomyopathy: Secondary | ICD-10-CM | POA: Diagnosis not present

## 2015-12-02 DIAGNOSIS — R4189 Other symptoms and signs involving cognitive functions and awareness: Secondary | ICD-10-CM | POA: Diagnosis not present

## 2015-12-03 DIAGNOSIS — R29898 Other symptoms and signs involving the musculoskeletal system: Secondary | ICD-10-CM | POA: Diagnosis not present

## 2015-12-03 DIAGNOSIS — I255 Ischemic cardiomyopathy: Secondary | ICD-10-CM | POA: Diagnosis not present

## 2015-12-03 DIAGNOSIS — R2689 Other abnormalities of gait and mobility: Secondary | ICD-10-CM | POA: Diagnosis not present

## 2015-12-03 DIAGNOSIS — M6281 Muscle weakness (generalized): Secondary | ICD-10-CM | POA: Diagnosis not present

## 2015-12-03 DIAGNOSIS — R4189 Other symptoms and signs involving cognitive functions and awareness: Secondary | ICD-10-CM | POA: Diagnosis not present

## 2015-12-03 DIAGNOSIS — Z9181 History of falling: Secondary | ICD-10-CM | POA: Diagnosis not present

## 2015-12-05 DIAGNOSIS — I255 Ischemic cardiomyopathy: Secondary | ICD-10-CM | POA: Diagnosis not present

## 2015-12-05 DIAGNOSIS — M6281 Muscle weakness (generalized): Secondary | ICD-10-CM | POA: Diagnosis not present

## 2015-12-05 DIAGNOSIS — R4189 Other symptoms and signs involving cognitive functions and awareness: Secondary | ICD-10-CM | POA: Diagnosis not present

## 2015-12-05 DIAGNOSIS — Z9181 History of falling: Secondary | ICD-10-CM | POA: Diagnosis not present

## 2015-12-05 DIAGNOSIS — R29898 Other symptoms and signs involving the musculoskeletal system: Secondary | ICD-10-CM | POA: Diagnosis not present

## 2015-12-05 DIAGNOSIS — R2689 Other abnormalities of gait and mobility: Secondary | ICD-10-CM | POA: Diagnosis not present

## 2016-01-06 DIAGNOSIS — E7800 Pure hypercholesterolemia, unspecified: Secondary | ICD-10-CM | POA: Diagnosis not present

## 2016-01-06 DIAGNOSIS — Z0000 Encounter for general adult medical examination without abnormal findings: Secondary | ICD-10-CM | POA: Diagnosis not present

## 2016-01-06 DIAGNOSIS — Z79899 Other long term (current) drug therapy: Secondary | ICD-10-CM | POA: Diagnosis not present

## 2016-01-06 DIAGNOSIS — Z1389 Encounter for screening for other disorder: Secondary | ICD-10-CM | POA: Diagnosis not present

## 2016-01-06 DIAGNOSIS — K5901 Slow transit constipation: Secondary | ICD-10-CM | POA: Diagnosis not present

## 2016-01-06 DIAGNOSIS — I129 Hypertensive chronic kidney disease with stage 1 through stage 4 chronic kidney disease, or unspecified chronic kidney disease: Secondary | ICD-10-CM | POA: Diagnosis not present

## 2016-01-06 DIAGNOSIS — E222 Syndrome of inappropriate secretion of antidiuretic hormone: Secondary | ICD-10-CM | POA: Diagnosis not present

## 2016-01-06 DIAGNOSIS — K219 Gastro-esophageal reflux disease without esophagitis: Secondary | ICD-10-CM | POA: Diagnosis not present

## 2016-01-06 DIAGNOSIS — N183 Chronic kidney disease, stage 3 (moderate): Secondary | ICD-10-CM | POA: Diagnosis not present

## 2016-01-20 DIAGNOSIS — M25572 Pain in left ankle and joints of left foot: Secondary | ICD-10-CM | POA: Diagnosis not present

## 2016-01-20 DIAGNOSIS — L602 Onychogryphosis: Secondary | ICD-10-CM | POA: Diagnosis not present

## 2016-03-03 ENCOUNTER — Ambulatory Visit (INDEPENDENT_AMBULATORY_CARE_PROVIDER_SITE_OTHER): Payer: Medicare Other | Admitting: Cardiology

## 2016-03-03 ENCOUNTER — Encounter: Payer: Self-pay | Admitting: Cardiology

## 2016-03-03 VITALS — BP 130/56 | HR 60 | Ht 63.0 in | Wt 124.8 lb

## 2016-03-03 DIAGNOSIS — I10 Essential (primary) hypertension: Secondary | ICD-10-CM | POA: Diagnosis not present

## 2016-03-03 DIAGNOSIS — I5181 Takotsubo syndrome: Secondary | ICD-10-CM | POA: Diagnosis not present

## 2016-03-03 DIAGNOSIS — I472 Ventricular tachycardia, unspecified: Secondary | ICD-10-CM

## 2016-03-03 DIAGNOSIS — I341 Nonrheumatic mitral (valve) prolapse: Secondary | ICD-10-CM

## 2016-03-03 DIAGNOSIS — I350 Nonrheumatic aortic (valve) stenosis: Secondary | ICD-10-CM

## 2016-03-03 DIAGNOSIS — I480 Paroxysmal atrial fibrillation: Secondary | ICD-10-CM | POA: Diagnosis not present

## 2016-03-03 HISTORY — DX: Nonrheumatic mitral (valve) prolapse: I34.1

## 2016-03-03 HISTORY — DX: Nonrheumatic aortic (valve) stenosis: I35.0

## 2016-03-03 NOTE — Progress Notes (Signed)
Cardiology Office Note    Date:  03/03/2016   ID:  [name], DOB 1923-09-18, MRN [id number]  PCP:  Mathews Argyle, MD  Cardiologist:  Fransico Him, MD   No chief complaint on file.   History of Present Illness:  [name] is a 82 y.o. female with history of stress induced MI, Takotsubo DCM with normalized EF, hypertensive heart disease , PAF who presents today for followup.  She denies any chest pain, SOB, DOE, palpitations. She has chronic LLE edema which is stable. She has had some more problems with falls but is not sure what causes it.  She thought she might have blacked out 2 weeks ago but states that she does not think she lost consciousness.  She uses a walker and is unsteadfy on her feet.    Past Medical History  Diagnosis Date  . Arthritis   . Heart murmur   . GERD (gastroesophageal reflux disease)   . Takotsubo cardiomyopathy     EF normalized to 70%  . Old MI (myocardial infarction)     NSTEMI secondary to stress MI/Takotsubo CM, minimal nonobstructive ASCAD at cath  . Back pain   . SIADH (syndrome of inappropriate ADH production) ([place])   . Hypertension   . PMR (polymyalgia rheumatica) (H[facility])   . GI bleed due to NSAIDs   . Depression   . Osteoporosis   . Hyperlipidemia   . CKD (chronic kidney disease) stage 3, GFR 30-59 ml/min   . PAF (paroxysmal atrial fibrillation) ([place]) 2006    not on anticoagulation due to GI bleed  . Ventricular tachycardia ([place])     on initial presentation of MI  . Bradycardia 08/28/2015  . Macular degeneration   . Amputation of finger of right hand   . Maxillary sinusitis   . Aortic stenosis, mild 03/03/2016    By echo 08/2015  . MVP (mitral valve prolapse) 03/03/2016    By echo 08/2015    Past Surgical History  Procedure Laterality Date  . Eye surgery  06/10/11    membrane peel   . Rotator cuff repair  1998  . Nasal sinus surgery Left   . Wisdom tooth extraction    . Cholecystectomy   11/10/2011    Procedure: LAPAROSCOPIC CHOLECYSTECTOMY WITH INTRAOPERATIVE CHOLANGIOGRAM;  Surgeon: Pedro Earls, MD;  Location: [facility];  Service: General;  Laterality: N/A;  . Umbilical hernia repair  11/10/2011    Procedure: HERNIA REPAIR UMBILICAL ADULT;  Surgeon: Pedro Earls, MD;  Location: [facility];  Service: General;  Laterality: N/A;  . Cardiac catheterization      normal coronary arteries    Current Medications: Outpatient Prescriptions Prior to Visit  Medication Sig Dispense Refill  . acetaminophen (TYLENOL) 500 MG tablet Take 500 mg by mouth every 6 (six) hours as needed for mild pain.     Marland Kitchen albuterol (PROVENTIL HFA;VENTOLIN HFA) 108 (90 Base) MCG/ACT inhaler Inhale 1-2 puffs into the lungs every 6 (six) hours as needed for wheezing or shortness of breath.    Marland Kitchen aspirin EC 81 MG tablet Take 81 mg by mouth at bedtime.     Marland Kitchen azelastine (ASTELIN) 137 MCG/SPRAY nasal spray Place 2 sprays into the nose 2 (two) times daily.     Marland Kitchen buPROPion (WELLBUTRIN XL) 150 MG 24 hr tablet Take 150 mg by mouth 2 (two) times daily.     . Calcium Carbonate-Vitamin D (CALCIUM 600+D) 600-400 MG-UNIT tablet Take 1 tablet by mouth 2 (two)  times daily.    . cetirizine (ZYRTEC) 10 MG tablet Take 10 mg by mouth daily.     . cloNIDine (CATAPRES) 0.1 MG tablet Take 0.1 mg by mouth 2 (two) times daily.    Marland Kitchen demeclocycline (DECLOMYCIN) 150 MG tablet Take 150 mg by mouth 2 (two) times daily.     . diclofenac sodium (VOLTAREN) 1 % GEL Apply 1 application topically 4 (four) times daily as needed. Pain     . fish oil-omega-3 fatty acids 1000 MG capsule Take 1 g by mouth daily.     Marland Kitchen FLUZONE HIGH-DOSE 0.5 ML SUSY FOR RPH ADMIN  0  . ibuprofen (ADVIL,MOTRIN) 200 MG tablet Take 200 mg by mouth 3 (three) times daily.    . methocarbamol (ROBAXIN) 500 MG tablet Take 500 mg by mouth as needed for muscle spasms.    . Multiple Vitamins-Minerals (CENTRUM CARDIO PO) Take 1 tablet by mouth 2 (two) times daily.     Marland Kitchen  omeprazole (PRILOSEC) 20 MG capsule Take 20 mg by mouth daily.     . pregabalin (LYRICA) 50 MG capsule Take 50 mg by mouth 2 (two) times daily.     . ramipril (ALTACE) 10 MG capsule Take 20 mg by mouth daily after breakfast.     . traMADol (ULTRAM-ER) 300 MG 24 hr tablet Take 300 mg by mouth daily as needed for pain.     Marland Kitchen amLODipine (NORVASC) 2.5 MG tablet Take 2.5 mg by mouth daily.    . carvedilol (COREG) 25 MG tablet Take 25 mg by mouth 2 (two) times daily.     No facility-administered medications prior to visit.     Allergies:   Amlodipine; Avelox; Duragesic disc transdermal system; Morphine and related; Teriparatide (recombinant); and Lidocaine   Social History   Social History  . Marital Status: Married    Spouse Name: N/A  . Number of Children: N/A  . Years of Education: N/A   Social History Main Topics  . Smoking status: Never Smoker   . Smokeless tobacco: Never Used  . Alcohol Use: No     Comment: occassionally  . Drug Use: No  . Sexual Activity: Not Asked   Other Topics Concern  . None   Social History Narrative     Family History:  The patient's family history includes Cancer in her mother.   ROS:   Please see the history of present illness.    Review of Systems  Musculoskeletal:       Left foot pain   All other systems reviewed and are negative.   PHYSICAL EXAM:   VS:  BP 130/56 mmHg  Pulse 60  Ht 5\' 3"  (1.6 m)  Wt 124 lb 12.8 oz (56.609 kg)  BMI 22.11 kg/m2   GEN: Well nourished, well developed, in no acute distress HEENT: normal Neck: no JVD, carotid bruits, or masses Cardiac: RRR; no rubs, or gallops,no edema.  Intact distal pulses bilaterally. 2/6 SM at RUSB Respiratory:  clear to auscultation bilaterally, normal work of breathing GI: soft, nontender, nondistended, + BS MS: no deformity or atrophy Skin: warm and dry, no rash Neuro:  Alert and Oriented x 3, Strength and sensation are intact Psych: euthymic mood, full affect  Wt Readings  from Last 3 Encounters:  03/03/16 124 lb 12.8 oz (56.609 kg)  08/28/15 122 lb 3.2 oz (55.43 kg)  02/15/15 121 lb (54.885 kg)      Studies/Labs Reviewed:   EKG:  EKG is not ordered today.  Recent Labs: 08/28/2015: BUN 30*; Creat 1.18*; Potassium 4.9; Sodium 136   Lipid Panel No results found for: CHOL, TRIG, HDL, CHOLHDL, VLDL, LDLCALC, LDLDIRECT  Additional studies/ records that were reviewed today include:  none    ASSESSMENT:    1. Takotsubo cardiomyopathy   2. Essential hypertension, benign   3. Ventricular tachycardia ([place])   4. PAF (paroxysmal atrial fibrillation) ([place])   5. Aortic stenosis, mild   6. MVP (mitral valve prolapse)      PLAN:  In order of problems listed above:  3. Takotsubo CM - EF has now normalized.  Continue BB and ACE I.  4. HTN - Bp controlled on current medical regimen.  Continue amlodipine/BB/[place]apress/ACE I 5. Ventricular tachycardia on presentation of NSTEMI in setting of Takotsubo CM - resolved on BB.  EF normal.   6. PAF - maintaining NSR on BB.  Not on anticoagulation due to history of GI bleed    Medication Adjustments/Labs and Tests Ordered: Current medicines are reviewed at length with the patient today.  Concerns regarding medicines are outlined above.  Medication changes, Labs and Tests ordered today are listed in the Patient Instructions below.  There are no Patient Instructions on file for this visit.   Signed, Fransico Him, MD  03/03/2016 10:30 AM    [facility] [place], [place], [place]  [address] Phone: [phone]; Fax: [phone]

## 2016-03-03 NOTE — Patient Instructions (Signed)

## 2016-07-07 DIAGNOSIS — E222 Syndrome of inappropriate secretion of antidiuretic hormone: Secondary | ICD-10-CM | POA: Diagnosis not present

## 2016-07-07 DIAGNOSIS — Z79899 Other long term (current) drug therapy: Secondary | ICD-10-CM | POA: Diagnosis not present

## 2016-07-07 DIAGNOSIS — I129 Hypertensive chronic kidney disease with stage 1 through stage 4 chronic kidney disease, or unspecified chronic kidney disease: Secondary | ICD-10-CM | POA: Diagnosis not present

## 2016-07-07 DIAGNOSIS — N183 Chronic kidney disease, stage 3 (moderate): Secondary | ICD-10-CM | POA: Diagnosis not present

## 2016-07-07 DIAGNOSIS — Z23 Encounter for immunization: Secondary | ICD-10-CM | POA: Diagnosis not present

## 2016-07-07 DIAGNOSIS — R269 Unspecified abnormalities of gait and mobility: Secondary | ICD-10-CM | POA: Diagnosis not present

## 2016-07-13 DIAGNOSIS — E871 Hypo-osmolality and hyponatremia: Secondary | ICD-10-CM | POA: Diagnosis not present

## 2016-07-13 DIAGNOSIS — F3289 Other specified depressive episodes: Secondary | ICD-10-CM | POA: Diagnosis not present

## 2016-07-13 DIAGNOSIS — R2689 Other abnormalities of gait and mobility: Secondary | ICD-10-CM | POA: Diagnosis not present

## 2016-07-13 DIAGNOSIS — M545 Low back pain: Secondary | ICD-10-CM | POA: Diagnosis not present

## 2016-07-13 DIAGNOSIS — R4189 Other symptoms and signs involving cognitive functions and awareness: Secondary | ICD-10-CM | POA: Diagnosis not present

## 2016-07-13 DIAGNOSIS — M818 Other osteoporosis without current pathological fracture: Secondary | ICD-10-CM | POA: Diagnosis not present

## 2016-07-13 DIAGNOSIS — R2681 Unsteadiness on feet: Secondary | ICD-10-CM | POA: Diagnosis not present

## 2016-07-13 DIAGNOSIS — R29898 Other symptoms and signs involving the musculoskeletal system: Secondary | ICD-10-CM | POA: Diagnosis not present

## 2016-07-13 DIAGNOSIS — M6281 Muscle weakness (generalized): Secondary | ICD-10-CM | POA: Diagnosis not present

## 2016-07-13 DIAGNOSIS — I255 Ischemic cardiomyopathy: Secondary | ICD-10-CM | POA: Diagnosis not present

## 2016-07-13 DIAGNOSIS — Z9181 History of falling: Secondary | ICD-10-CM | POA: Diagnosis not present

## 2016-07-16 DIAGNOSIS — R29898 Other symptoms and signs involving the musculoskeletal system: Secondary | ICD-10-CM | POA: Diagnosis not present

## 2016-07-16 DIAGNOSIS — M6281 Muscle weakness (generalized): Secondary | ICD-10-CM | POA: Diagnosis not present

## 2016-07-16 DIAGNOSIS — Z9181 History of falling: Secondary | ICD-10-CM | POA: Diagnosis not present

## 2016-07-16 DIAGNOSIS — R4189 Other symptoms and signs involving cognitive functions and awareness: Secondary | ICD-10-CM | POA: Diagnosis not present

## 2016-07-16 DIAGNOSIS — R2681 Unsteadiness on feet: Secondary | ICD-10-CM | POA: Diagnosis not present

## 2016-07-16 DIAGNOSIS — R2689 Other abnormalities of gait and mobility: Secondary | ICD-10-CM | POA: Diagnosis not present

## 2016-07-21 DIAGNOSIS — M818 Other osteoporosis without current pathological fracture: Secondary | ICD-10-CM | POA: Diagnosis not present

## 2016-07-21 DIAGNOSIS — E871 Hypo-osmolality and hyponatremia: Secondary | ICD-10-CM | POA: Diagnosis not present

## 2016-07-21 DIAGNOSIS — M545 Low back pain: Secondary | ICD-10-CM | POA: Diagnosis not present

## 2016-07-21 DIAGNOSIS — M6281 Muscle weakness (generalized): Secondary | ICD-10-CM | POA: Diagnosis not present

## 2016-07-21 DIAGNOSIS — R2681 Unsteadiness on feet: Secondary | ICD-10-CM | POA: Diagnosis not present

## 2016-07-21 DIAGNOSIS — Z9181 History of falling: Secondary | ICD-10-CM | POA: Diagnosis not present

## 2016-07-21 DIAGNOSIS — R29898 Other symptoms and signs involving the musculoskeletal system: Secondary | ICD-10-CM | POA: Diagnosis not present

## 2016-07-21 DIAGNOSIS — F3289 Other specified depressive episodes: Secondary | ICD-10-CM | POA: Diagnosis not present

## 2016-07-21 DIAGNOSIS — R4189 Other symptoms and signs involving cognitive functions and awareness: Secondary | ICD-10-CM | POA: Diagnosis not present

## 2016-07-21 DIAGNOSIS — I255 Ischemic cardiomyopathy: Secondary | ICD-10-CM | POA: Diagnosis not present

## 2016-07-21 DIAGNOSIS — R2689 Other abnormalities of gait and mobility: Secondary | ICD-10-CM | POA: Diagnosis not present

## 2016-07-23 DIAGNOSIS — M6281 Muscle weakness (generalized): Secondary | ICD-10-CM | POA: Diagnosis not present

## 2016-07-23 DIAGNOSIS — R29898 Other symptoms and signs involving the musculoskeletal system: Secondary | ICD-10-CM | POA: Diagnosis not present

## 2016-07-23 DIAGNOSIS — R4189 Other symptoms and signs involving cognitive functions and awareness: Secondary | ICD-10-CM | POA: Diagnosis not present

## 2016-07-23 DIAGNOSIS — R2681 Unsteadiness on feet: Secondary | ICD-10-CM | POA: Diagnosis not present

## 2016-07-23 DIAGNOSIS — R2689 Other abnormalities of gait and mobility: Secondary | ICD-10-CM | POA: Diagnosis not present

## 2016-07-23 DIAGNOSIS — Z9181 History of falling: Secondary | ICD-10-CM | POA: Diagnosis not present

## 2016-07-27 ENCOUNTER — Other Ambulatory Visit: Payer: Self-pay | Admitting: Cardiology

## 2016-07-28 DIAGNOSIS — R2689 Other abnormalities of gait and mobility: Secondary | ICD-10-CM | POA: Diagnosis not present

## 2016-07-28 DIAGNOSIS — R2681 Unsteadiness on feet: Secondary | ICD-10-CM | POA: Diagnosis not present

## 2016-07-28 DIAGNOSIS — R4189 Other symptoms and signs involving cognitive functions and awareness: Secondary | ICD-10-CM | POA: Diagnosis not present

## 2016-07-28 DIAGNOSIS — Z9181 History of falling: Secondary | ICD-10-CM | POA: Diagnosis not present

## 2016-07-28 DIAGNOSIS — M6281 Muscle weakness (generalized): Secondary | ICD-10-CM | POA: Diagnosis not present

## 2016-07-28 DIAGNOSIS — R29898 Other symptoms and signs involving the musculoskeletal system: Secondary | ICD-10-CM | POA: Diagnosis not present

## 2016-07-30 DIAGNOSIS — R4189 Other symptoms and signs involving cognitive functions and awareness: Secondary | ICD-10-CM | POA: Diagnosis not present

## 2016-07-30 DIAGNOSIS — Z9181 History of falling: Secondary | ICD-10-CM | POA: Diagnosis not present

## 2016-07-30 DIAGNOSIS — R2681 Unsteadiness on feet: Secondary | ICD-10-CM | POA: Diagnosis not present

## 2016-07-30 DIAGNOSIS — R29898 Other symptoms and signs involving the musculoskeletal system: Secondary | ICD-10-CM | POA: Diagnosis not present

## 2016-07-30 DIAGNOSIS — M6281 Muscle weakness (generalized): Secondary | ICD-10-CM | POA: Diagnosis not present

## 2016-07-30 DIAGNOSIS — R2689 Other abnormalities of gait and mobility: Secondary | ICD-10-CM | POA: Diagnosis not present

## 2016-08-03 DIAGNOSIS — L219 Seborrheic dermatitis, unspecified: Secondary | ICD-10-CM | POA: Diagnosis not present

## 2016-08-03 DIAGNOSIS — L821 Other seborrheic keratosis: Secondary | ICD-10-CM | POA: Diagnosis not present

## 2016-08-04 ENCOUNTER — Other Ambulatory Visit: Payer: Self-pay | Admitting: Cardiology

## 2016-08-04 DIAGNOSIS — R29898 Other symptoms and signs involving the musculoskeletal system: Secondary | ICD-10-CM | POA: Diagnosis not present

## 2016-08-04 DIAGNOSIS — R2681 Unsteadiness on feet: Secondary | ICD-10-CM | POA: Diagnosis not present

## 2016-08-04 DIAGNOSIS — Z9181 History of falling: Secondary | ICD-10-CM | POA: Diagnosis not present

## 2016-08-04 DIAGNOSIS — R4189 Other symptoms and signs involving cognitive functions and awareness: Secondary | ICD-10-CM | POA: Diagnosis not present

## 2016-08-04 DIAGNOSIS — M6281 Muscle weakness (generalized): Secondary | ICD-10-CM | POA: Diagnosis not present

## 2016-08-04 DIAGNOSIS — R2689 Other abnormalities of gait and mobility: Secondary | ICD-10-CM | POA: Diagnosis not present

## 2016-08-04 NOTE — Telephone Encounter (Signed)
amLODipine (NORVASC) 5 MG tablet  Medication  Date: 07/27/2016 Department: [address] Office Ordering/Authorizing: [name], MD  Order Providers   Prescribing Provider Encounter Provider  [name], MD [name], MD  Medication Detail    Disp Refills Start End   amLODipine (NORVASC) 5 MG tablet 90 tablet 3 07/27/2016    Sig: TAKE 1 TABLET BY MOUTH EVERY DAY   E-Prescribing Status: Receipt confirmed by pharmacy (07/27/2016 2:26 PM EDT)   Pharmacy   [id number] - [place], [place] - [place]

## 2016-08-06 ENCOUNTER — Encounter: Payer: Self-pay | Admitting: Cardiology

## 2016-08-06 DIAGNOSIS — R2681 Unsteadiness on feet: Secondary | ICD-10-CM | POA: Diagnosis not present

## 2016-08-06 DIAGNOSIS — R4189 Other symptoms and signs involving cognitive functions and awareness: Secondary | ICD-10-CM | POA: Diagnosis not present

## 2016-08-06 DIAGNOSIS — R2689 Other abnormalities of gait and mobility: Secondary | ICD-10-CM | POA: Diagnosis not present

## 2016-08-06 DIAGNOSIS — Z9181 History of falling: Secondary | ICD-10-CM | POA: Diagnosis not present

## 2016-08-06 DIAGNOSIS — M6281 Muscle weakness (generalized): Secondary | ICD-10-CM | POA: Diagnosis not present

## 2016-08-06 DIAGNOSIS — R29898 Other symptoms and signs involving the musculoskeletal system: Secondary | ICD-10-CM | POA: Diagnosis not present

## 2016-08-12 DIAGNOSIS — R2681 Unsteadiness on feet: Secondary | ICD-10-CM | POA: Diagnosis not present

## 2016-08-12 DIAGNOSIS — R2689 Other abnormalities of gait and mobility: Secondary | ICD-10-CM | POA: Diagnosis not present

## 2016-08-12 DIAGNOSIS — R4189 Other symptoms and signs involving cognitive functions and awareness: Secondary | ICD-10-CM | POA: Diagnosis not present

## 2016-08-12 DIAGNOSIS — R29898 Other symptoms and signs involving the musculoskeletal system: Secondary | ICD-10-CM | POA: Diagnosis not present

## 2016-08-12 DIAGNOSIS — M6281 Muscle weakness (generalized): Secondary | ICD-10-CM | POA: Diagnosis not present

## 2016-08-12 DIAGNOSIS — Z9181 History of falling: Secondary | ICD-10-CM | POA: Diagnosis not present

## 2016-08-14 DIAGNOSIS — Z9181 History of falling: Secondary | ICD-10-CM | POA: Diagnosis not present

## 2016-08-14 DIAGNOSIS — R29898 Other symptoms and signs involving the musculoskeletal system: Secondary | ICD-10-CM | POA: Diagnosis not present

## 2016-08-14 DIAGNOSIS — R4189 Other symptoms and signs involving cognitive functions and awareness: Secondary | ICD-10-CM | POA: Diagnosis not present

## 2016-08-14 DIAGNOSIS — M6281 Muscle weakness (generalized): Secondary | ICD-10-CM | POA: Diagnosis not present

## 2016-08-14 DIAGNOSIS — R2689 Other abnormalities of gait and mobility: Secondary | ICD-10-CM | POA: Diagnosis not present

## 2016-08-14 DIAGNOSIS — R2681 Unsteadiness on feet: Secondary | ICD-10-CM | POA: Diagnosis not present

## 2016-08-17 ENCOUNTER — Other Ambulatory Visit: Payer: Self-pay | Admitting: Cardiology

## 2016-08-17 DIAGNOSIS — R2689 Other abnormalities of gait and mobility: Secondary | ICD-10-CM | POA: Diagnosis not present

## 2016-08-17 DIAGNOSIS — R4189 Other symptoms and signs involving cognitive functions and awareness: Secondary | ICD-10-CM | POA: Diagnosis not present

## 2016-08-17 DIAGNOSIS — Z9181 History of falling: Secondary | ICD-10-CM | POA: Diagnosis not present

## 2016-08-17 DIAGNOSIS — R29898 Other symptoms and signs involving the musculoskeletal system: Secondary | ICD-10-CM | POA: Diagnosis not present

## 2016-08-17 DIAGNOSIS — M6281 Muscle weakness (generalized): Secondary | ICD-10-CM | POA: Diagnosis not present

## 2016-08-17 DIAGNOSIS — R2681 Unsteadiness on feet: Secondary | ICD-10-CM | POA: Diagnosis not present

## 2016-08-19 ENCOUNTER — Encounter (INDEPENDENT_AMBULATORY_CARE_PROVIDER_SITE_OTHER): Payer: Self-pay

## 2016-08-19 ENCOUNTER — Encounter: Payer: Self-pay | Admitting: Cardiology

## 2016-08-19 ENCOUNTER — Ambulatory Visit (INDEPENDENT_AMBULATORY_CARE_PROVIDER_SITE_OTHER): Payer: Medicare Other | Admitting: Cardiology

## 2016-08-19 VITALS — BP 102/58 | HR 56 | Ht 63.0 in | Wt 120.0 lb

## 2016-08-19 DIAGNOSIS — I350 Nonrheumatic aortic (valve) stenosis: Secondary | ICD-10-CM

## 2016-08-19 DIAGNOSIS — I10 Essential (primary) hypertension: Secondary | ICD-10-CM | POA: Diagnosis not present

## 2016-08-19 DIAGNOSIS — I4819 Other persistent atrial fibrillation: Secondary | ICD-10-CM

## 2016-08-19 DIAGNOSIS — I5181 Takotsubo syndrome: Secondary | ICD-10-CM

## 2016-08-19 DIAGNOSIS — I341 Nonrheumatic mitral (valve) prolapse: Secondary | ICD-10-CM | POA: Diagnosis not present

## 2016-08-19 DIAGNOSIS — I481 Persistent atrial fibrillation: Secondary | ICD-10-CM

## 2016-08-19 LAB — BASIC METABOLIC PANEL
BUN: 24 mg/dL (ref 7–25)
CALCIUM: 9.1 mg/dL (ref 8.6–10.4)
CO2: 24 mmol/L (ref 20–31)
Chloride: 106 mmol/L (ref 98–110)
Creat: 1.17 mg/dL — ABNORMAL HIGH (ref 0.60–0.88)
GLUCOSE: 88 mg/dL (ref 65–99)
POTASSIUM: 5 mmol/L (ref 3.5–5.3)
SODIUM: 140 mmol/L (ref 135–146)

## 2016-08-19 MED ORDER — RAMIPRIL 10 MG PO CAPS: 10.0000 mg | ORAL_CAPSULE | Freq: Every day | ORAL | 11 refills | Status: DC

## 2016-08-19 NOTE — Patient Instructions (Signed)
Medication Instructions:  1) DECREASE ALTACE to 10 mg daily  Labwork: TODAY: BMET  Testing/Procedures: None  Follow-Up: Your physician recommends that you schedule a follow-up appointment in 1 WEEK with the HTN CLINIC.  Your physician wants you to follow-up in: 6 months with Dr. Radford Pax. You will receive a reminder letter in the mail two months in advance. If you don't receive a letter, please call our office to schedule the follow-up appointment.   Any Other Special Instructions Will Be Listed Below (If Applicable).     If you need a refill on your cardiac medications before your next appointment, please call your pharmacy.

## 2016-08-19 NOTE — Progress Notes (Signed)
Cardiology Office Note    Date:  08/19/2016   ID:  [name], DOB Oct 09, 1923, MRN [id number]  PCP:  Mathews Argyle, MD  Cardiologist:  Fransico Him, MD   Chief Complaint  Patient presents with  . Atrial Fibrillation  . Hypertension  . Cardiomyopathy    History of Present Illness:  [name] is a 82 y.o. female with history of stress induced MI, Takotsubo DCM with normalized EF, hypertensive heart disease , PAF who presents today for followup.  She denies any chest pain, SOB, DOE, palpitations. She has had LE edema in the past but not recently.  She has not had any dizziness, falls or syncope.   Past Medical History:  Diagnosis Date  . Amputation of finger of right hand   . Aortic stenosis, mild 03/03/2016   By echo 08/2015  . Arthritis   . Back pain   . Bradycardia 08/28/2015  . CKD (chronic kidney disease) stage 3, GFR 30-59 ml/min   . Depression   . GERD (gastroesophageal reflux disease)   . GI bleed due to NSAIDs   . Heart murmur   . Hyperlipidemia   . Hypertension   . Macular degeneration   . Maxillary sinusitis   . MVP (mitral valve prolapse) 03/03/2016   By echo 08/2015  . Old MI (myocardial infarction)    NSTEMI secondary to stress MI/Takotsubo CM, minimal nonobstructive ASCAD at cath  . Osteoporosis   . Persistent atrial fibrillation ([place]) 2006   not on anticoagulation due to GI bleed and increased fall risk  . PMR (polymyalgia rheumatica) (H[facility])   . SIADH (syndrome of inappropriate ADH production) ([place])   . Takotsubo cardiomyopathy    EF normalized to 70%  . Ventricular tachycardia ([place])    on initial presentation of MI    Past Surgical History:  Procedure Laterality Date  . CARDIAC CATHETERIZATION     normal coronary arteries  . CHOLECYSTECTOMY  11/10/2011   Procedure: LAPAROSCOPIC CHOLECYSTECTOMY WITH INTRAOPERATIVE CHOLANGIOGRAM;  Surgeon: Pedro Earls, MD;  Location: [facility];  Service: General;  Laterality: N/A;   . EYE SURGERY  06/10/11   membrane peel   . NASAL SINUS SURGERY Left   . ROTATOR CUFF REPAIR  1998  . UMBILICAL HERNIA REPAIR  11/10/2011   Procedure: HERNIA REPAIR UMBILICAL ADULT;  Surgeon: Pedro Earls, MD;  Location: [facility];  Service: General;  Laterality: N/A;  . WISDOM TOOTH EXTRACTION      Current Medications: Outpatient Medications Prior to Visit  Medication Sig Dispense Refill  . acetaminophen (TYLENOL) 500 MG tablet Take 500 mg by mouth every 6 (six) hours as needed for mild pain.     Marland Kitchen amLODipine (NORVASC) 5 MG tablet TAKE 1 TABLET BY MOUTH EVERY DAY 90 tablet 3  . aspirin EC 81 MG tablet Take 81 mg by mouth at bedtime.     Marland Kitchen azelastine (ASTELIN) 137 MCG/SPRAY nasal spray Place 2 sprays into the nose 2 (two) times daily.     Marland Kitchen buPROPion (WELLBUTRIN XL) 150 MG 24 hr tablet Take 150 mg by mouth 2 (two) times daily.     . carvedilol (COREG) 12.5 MG tablet 12.5 mg. Take 1/2 tablet by mouth twice daily  3  . cetirizine (ZYRTEC) 10 MG tablet Take 10 mg by mouth daily.     . cloNIDine (CATAPRES) 0.1 MG tablet Take 0.1 mg by mouth 2 (two) times daily.    . diclofenac sodium (VOLTAREN) 1 % GEL Apply  1 application topically 4 (four) times daily as needed. Pain     . fish oil-omega-3 fatty acids 1000 MG capsule Take 1 g by mouth daily.     Marland Kitchen ibuprofen (ADVIL,MOTRIN) 200 MG tablet Take 200 mg by mouth 3 (three) times daily.    . Multiple Vitamins-Minerals (CENTRUM CARDIO PO) Take 1 tablet by mouth 2 (two) times daily.     Marland Kitchen omeprazole (PRILOSEC) 20 MG capsule Take 20 mg by mouth daily.     . pregabalin (LYRICA) 50 MG capsule Take 50 mg by mouth daily.     . ramipril (ALTACE) 10 MG capsule Take 20 mg by mouth daily after breakfast.     . traMADol (ULTRAM-ER) 300 MG 24 hr tablet Take 300 mg by mouth daily as needed for pain.     . Calcium Carbonate-Vitamin D (CALCIUM 600+D) 600-400 MG-UNIT tablet Take 1 tablet by mouth 2 (two) times daily.    Marland Kitchen demeclocycline (DECLOMYCIN) 150 MG tablet  Take 150 mg by mouth 2 (two) times daily.     . methocarbamol (ROBAXIN) 500 MG tablet Take 500 mg by mouth as needed for muscle spasms.    Marland Kitchen albuterol (PROVENTIL HFA;VENTOLIN HFA) 108 (90 Base) MCG/ACT inhaler Inhale 1-2 puffs into the lungs every 6 (six) hours as needed for wheezing or shortness of breath.    Marland Kitchen amLODipine (NORVASC) 5 MG tablet Take 5 mg by mouth daily.  3  . carvedilol (COREG) 12.5 MG tablet TAKE 1 TABLET BY MOUTH TWICE A DAY WITH A MEAL 180 tablet 3  . FLUZONE HIGH-DOSE 0.5 ML SUSY FOR RPH ADMIN  0   No facility-administered medications prior to visit.      Allergies:   Amlodipine; Avelox [moxifloxacin hcl in nacl]; Duragesic disc transdermal system [alcohol-fentanyl]; Morphine and related; Teriparatide (recombinant); and Lidocaine   Social History   Social History  . Marital status: Married    Spouse name: N/A  . Number of children: N/A  . Years of education: N/A   Social History Main Topics  . Smoking status: Never Smoker  . Smokeless tobacco: Never Used  . Alcohol use No     Comment: occassionally  . Drug use: No  . Sexual activity: Not Asked   Other Topics Concern  . None   Social History Narrative  . None     Family History:  The patient's family history includes Cancer in her mother.   ROS:   Please see the history of present illness.    ROS All other systems reviewed and are negative.  No flowsheet data found.     PHYSICAL EXAM:   VS:  BP (!) 102/58   Pulse (!) 56   Ht 5\' 3"  (1.6 m)   Wt 120 lb (54.4 kg)   BMI 21.26 kg/m    GEN: Well nourished, well developed, in no acute distress  HEENT: normal  Neck: no JVD, carotid bruits, or masses Cardiac: RRR; no rubs, or gallops,no edema.  Intact distal pulses bilaterally. 2/6 SM at RUSB radiating to the LLSB and carotid arteries. Respiratory:  clear to auscultation bilaterally, normal work of breathing GI: soft, nontender, nondistended, + BS MS: no deformity or atrophy  Skin: warm and dry,  no rash Neuro:  Alert and Oriented x 3, Strength and sensation are intact Psych: euthymic mood, full affect  Wt Readings from Last 3 Encounters:  08/19/16 120 lb (54.4 kg)  03/03/16 124 lb 12.8 oz (56.6 kg)  08/28/15 122 lb 3.2 oz (  55.4 kg)      Studies/Labs Reviewed:   EKG:  EKG is ordered today.  The ekg ordered today demonstrates sinus bradycardia at 56bpm with no ST changes.  Recent Labs: 08/28/2015: BUN 30; Creat 1.18; Potassium 4.9; Sodium 136   Lipid Panel No results found for: CHOL, TRIG, HDL, CHOLHDL, VLDL, LDLCALC, LDLDIRECT  Additional studies/ records that were reviewed today include:  none    ASSESSMENT:    1. Takotsubo cardiomyopathy   2. Persistent atrial fibrillation ([place])   3. MVP (mitral valve prolapse)   4. Essential hypertension, benign   5. Aortic stenosis, mild      PLAN:  In order of problems listed above:  1. Takotsubo DCM - EF normalized on echo.  Continue BB and ACE I. 2. Persistent atrial fibrillation - maintaining NSR.  Continue BB.  She is not on anticoagulation due to increased fall risk and history of GI bleed.  3. MVP - bileaflet with mild MR by echo 08/2015. 4. HTN - BP controlled and actually on the soft side on current meds.  Continue amlodipine/BB/Clonidine and ACE I but decrease altace to 10mg  daily.  Followup in HTN clinic in 1 week.   Check BMET. 5. Mild AS by echo 08/2015.      Medication Adjustments/Labs and Tests Ordered: Current medicines are reviewed at length with the patient today.  Concerns regarding medicines are outlined above.  Medication changes, Labs and Tests ordered today are listed in the Patient Instructions below.  There are no Patient Instructions on file for this visit.   Signed, Fransico Him, MD  08/19/2016 11:11 AM    [facility], [place], [place]  [address] Phone: [phone]; Fax: [phone]

## 2016-08-21 DIAGNOSIS — Z9181 History of falling: Secondary | ICD-10-CM | POA: Diagnosis not present

## 2016-08-21 DIAGNOSIS — F3289 Other specified depressive episodes: Secondary | ICD-10-CM | POA: Diagnosis not present

## 2016-08-21 DIAGNOSIS — R2689 Other abnormalities of gait and mobility: Secondary | ICD-10-CM | POA: Diagnosis not present

## 2016-08-21 DIAGNOSIS — R2681 Unsteadiness on feet: Secondary | ICD-10-CM | POA: Diagnosis not present

## 2016-08-21 DIAGNOSIS — I255 Ischemic cardiomyopathy: Secondary | ICD-10-CM | POA: Diagnosis not present

## 2016-08-21 DIAGNOSIS — M545 Low back pain: Secondary | ICD-10-CM | POA: Diagnosis not present

## 2016-08-21 DIAGNOSIS — M818 Other osteoporosis without current pathological fracture: Secondary | ICD-10-CM | POA: Diagnosis not present

## 2016-08-21 DIAGNOSIS — R4189 Other symptoms and signs involving cognitive functions and awareness: Secondary | ICD-10-CM | POA: Diagnosis not present

## 2016-08-21 DIAGNOSIS — E871 Hypo-osmolality and hyponatremia: Secondary | ICD-10-CM | POA: Diagnosis not present

## 2016-08-21 DIAGNOSIS — M6281 Muscle weakness (generalized): Secondary | ICD-10-CM | POA: Diagnosis not present

## 2016-08-21 DIAGNOSIS — R29898 Other symptoms and signs involving the musculoskeletal system: Secondary | ICD-10-CM | POA: Diagnosis not present

## 2016-08-24 DIAGNOSIS — R4189 Other symptoms and signs involving cognitive functions and awareness: Secondary | ICD-10-CM | POA: Diagnosis not present

## 2016-08-24 DIAGNOSIS — Z9181 History of falling: Secondary | ICD-10-CM | POA: Diagnosis not present

## 2016-08-24 DIAGNOSIS — R2689 Other abnormalities of gait and mobility: Secondary | ICD-10-CM | POA: Diagnosis not present

## 2016-08-24 DIAGNOSIS — M6281 Muscle weakness (generalized): Secondary | ICD-10-CM | POA: Diagnosis not present

## 2016-08-24 DIAGNOSIS — R29898 Other symptoms and signs involving the musculoskeletal system: Secondary | ICD-10-CM | POA: Diagnosis not present

## 2016-08-24 DIAGNOSIS — R2681 Unsteadiness on feet: Secondary | ICD-10-CM | POA: Diagnosis not present

## 2016-08-26 DIAGNOSIS — R2681 Unsteadiness on feet: Secondary | ICD-10-CM | POA: Diagnosis not present

## 2016-08-26 DIAGNOSIS — R29898 Other symptoms and signs involving the musculoskeletal system: Secondary | ICD-10-CM | POA: Diagnosis not present

## 2016-08-26 DIAGNOSIS — R4189 Other symptoms and signs involving cognitive functions and awareness: Secondary | ICD-10-CM | POA: Diagnosis not present

## 2016-08-26 DIAGNOSIS — M6281 Muscle weakness (generalized): Secondary | ICD-10-CM | POA: Diagnosis not present

## 2016-08-26 DIAGNOSIS — Z9181 History of falling: Secondary | ICD-10-CM | POA: Diagnosis not present

## 2016-08-26 DIAGNOSIS — R2689 Other abnormalities of gait and mobility: Secondary | ICD-10-CM | POA: Diagnosis not present

## 2016-08-27 DIAGNOSIS — R2689 Other abnormalities of gait and mobility: Secondary | ICD-10-CM | POA: Diagnosis not present

## 2016-08-27 DIAGNOSIS — R4189 Other symptoms and signs involving cognitive functions and awareness: Secondary | ICD-10-CM | POA: Diagnosis not present

## 2016-08-27 DIAGNOSIS — Z9181 History of falling: Secondary | ICD-10-CM | POA: Diagnosis not present

## 2016-08-27 DIAGNOSIS — R2681 Unsteadiness on feet: Secondary | ICD-10-CM | POA: Diagnosis not present

## 2016-08-27 DIAGNOSIS — M6281 Muscle weakness (generalized): Secondary | ICD-10-CM | POA: Diagnosis not present

## 2016-08-27 DIAGNOSIS — R29898 Other symptoms and signs involving the musculoskeletal system: Secondary | ICD-10-CM | POA: Diagnosis not present

## 2016-08-27 NOTE — Progress Notes (Signed)
Patient ID: [name]                 DOB: 1923-08-01                      MRN: [id number]     HPI: [name] is a 82 y.o. female referred by Dr. Radford Pax to HTN clinic. PMH of Takotsubo cardiomyopathy, MI, Vtach, Afib, HTN, HLD, chronic LLE edema. Last BP in clinic was 102/58 on carvedilol 12.5 mg BID, amlodipine 5 mg daily, and ramipril 20 mg daily. Her dose of ramipril was decreased to 10 mg daily. Her recent BMET from 11/1 showed Na 140, K 5.0 (baseline 5), SCr 1.17 (baseline ~1.1). Patient presents today for BP follow up.  In clinic, pt reports that she experiences some fatigue and SOB while walking which causes her to stop and collect herself. She denies dizziness, CP, and blurred vision. She denies ADRs with current antihypertensive therapy and reports that she has not taken clonidine for a few years - medication list has been updated. She occasionally checks BP at home which she says averages in the 130-140s/60s. Her BP reading in clinic today was 148/60, but patient reports having drank coffee this morning and being in relatively significantly osteopathic pain this morning. Pt has been taking ibuprofen PRN to help with pain. Physical exam was negative for edema in her ankles.   Current HTN meds: Carvedilol 12.5 mg BID, amlodipine 5 mg daily, ramipril 10 mg daily Previously tried: ramipril 20 mg daily (d/c'd due to low BP), amlodipine (d/c'd in the past due to edema), clonidine 0.1 mg BID (d/c'd several years ago) BP goal: <150/90 mmHg  Family History: Cancer (mother)  Social History: Non-smoker, denies alcohol and drug abuse.   Exercise: Exercises twice a week at the gym with a therapist  Home BP readings: Patient occasionally checks BP at home. Her BP averages 130-140s/60s.  Wt Readings from Last 3 Encounters:  08/19/16 120 lb (54.4 kg)  03/03/16 124 lb 12.8 oz (56.6 kg)  08/28/15 122 lb 3.2 oz (55.4 kg)   BP Readings from Last 3 Encounters:  08/19/16  (!) 102/58  03/03/16 (!) 130/56  08/28/15 140/64   Pulse Readings from Last 3 Encounters:  08/19/16 (!) 56  03/03/16 60  08/28/15 (!) 55    Renal function: Estimated Creatinine Clearance: 25.9 mL/min (by C-G formula based on SCr of 1.17 mg/dL (H)).  Past Medical History:  Diagnosis Date  . Amputation of finger of right hand   . Aortic stenosis, mild 03/03/2016   By echo 08/2015  . Arthritis   . Back pain   . Bradycardia 08/28/2015  . CKD (chronic kidney disease) stage 3, GFR 30-59 ml/min   . Depression   . GERD (gastroesophageal reflux disease)   . GI bleed due to NSAIDs   . Heart murmur   . Hyperlipidemia   . Hypertension   . Macular degeneration   . Maxillary sinusitis   . MVP (mitral valve prolapse) 03/03/2016   By echo 08/2015  . Old MI (myocardial infarction)    NSTEMI secondary to stress MI/Takotsubo CM, minimal nonobstructive ASCAD at cath  . Osteoporosis   . Persistent atrial fibrillation ([place]) 2006   not on anticoagulation due to GI bleed and increased fall risk  . PMR (polymyalgia rheumatica) ([facility])   . SIADH (syndrome of inappropriate ADH production) ([place])   . Takotsubo cardiomyopathy    EF normalized to 70%  . Ventricular  tachycardia ([place])    on initial presentation of MI    Current Outpatient Prescriptions on File Prior to Visit  Medication Sig Dispense Refill  . acetaminophen (TYLENOL) 500 MG tablet Take 500 mg by mouth every 6 (six) hours as needed for mild pain.     Marland Kitchen amLODipine (NORVASC) 5 MG tablet TAKE 1 TABLET BY MOUTH EVERY DAY 90 tablet 3  . aspirin EC 81 MG tablet Take 81 mg by mouth at bedtime.     Marland Kitchen azelastine (ASTELIN) 137 MCG/SPRAY nasal spray Place 2 sprays into the nose 2 (two) times daily.     Marland Kitchen buPROPion (WELLBUTRIN XL) 150 MG 24 hr tablet Take 150 mg by mouth 2 (two) times daily.     . Calcium Carbonate-Vitamin D (CALCIUM 600+D) 600-400 MG-UNIT tablet Take 1 tablet by mouth 2 (two) times daily.    . carvedilol (COREG) 12.5 MG tablet  12.5 mg. Take 1/2 tablet by mouth twice daily  3  . cetirizine (ZYRTEC) 10 MG tablet Take 10 mg by mouth daily.     . cloNIDine (CATAPRES) 0.1 MG tablet Take 0.1 mg by mouth 2 (two) times daily.    Marland Kitchen demeclocycline (DECLOMYCIN) 150 MG tablet Take 150 mg by mouth 2 (two) times daily.     . diclofenac sodium (VOLTAREN) 1 % GEL Apply 1 application topically 4 (four) times daily as needed. Pain     . fish oil-omega-3 fatty acids 1000 MG capsule Take 1 g by mouth daily.     Marland Kitchen ibuprofen (ADVIL,MOTRIN) 200 MG tablet Take 200 mg by mouth 3 (three) times daily.    . methocarbamol (ROBAXIN) 500 MG tablet Take 500 mg by mouth as needed for muscle spasms.    . Multiple Vitamins-Minerals (CENTRUM CARDIO PO) Take 1 tablet by mouth 2 (two) times daily.     Marland Kitchen omeprazole (PRILOSEC) 20 MG capsule Take 20 mg by mouth daily.     . pregabalin (LYRICA) 50 MG capsule Take 50 mg by mouth daily.     . ramipril (ALTACE) 10 MG capsule Take 1 capsule (10 mg total) by mouth daily after breakfast. 30 capsule 11  . traMADol (ULTRAM-ER) 300 MG 24 hr tablet Take 300 mg by mouth daily as needed for pain.      No current facility-administered medications on file prior to visit.     Allergies  Allergen Reactions  . Amlodipine Swelling    To feet and ankles   . Avelox [Moxifloxacin Hcl In Nacl] Other (See Comments)    Pt does not remember   . Duragesic Disc Transdermal System [Alcohol-Fentanyl] Other (See Comments)    Reaction unknown  . Morphine And Related Other (See Comments)    "My Sister and daughter can,t take it. I've had it before without problems."  . Teriparatide (Recombinant) Other (See Comments)    Made skin dry  . Lidocaine Rash     Assessment/Plan:  1. Hypertension - Pt's BP of 148/60 mmHg is within goal of <150/90 mmHg. She is currently tolerating her medications and is not experiencing side effects/issues with therapy. Will continue her current regimen of carvedilol 12.5 mg BID, amlodipine 5 mg  daily, ramipril 10 mg daily. Advised her to d/c using ibuprofen for pain and to use acetaminophen instead since NSAIDS will raise BP. Recommended 500 mg of acetaminophen Q6hr PRN with a max daily dose of 3,000 mg/day. Patient advised to call the clinic for an appointment if her systolic BP becomes elevated >150 or becomes  too low <110.   Patient seen by Leroy Libman, P4 pharmacy student.  Jax Abdelrahman E. Arafat Cocuzza, PharmD, [facility] [address] [place], [place] [address] Phone: [phone]; Fax: [phone] 08/28/2016 12:20 PM

## 2016-08-28 ENCOUNTER — Ambulatory Visit (INDEPENDENT_AMBULATORY_CARE_PROVIDER_SITE_OTHER): Payer: Medicare Other | Admitting: Pharmacist

## 2016-08-28 ENCOUNTER — Encounter: Payer: Self-pay | Admitting: Pharmacist

## 2016-08-28 VITALS — BP 148/60 | HR 56

## 2016-08-28 DIAGNOSIS — I10 Essential (primary) hypertension: Secondary | ICD-10-CM

## 2016-08-28 NOTE — Patient Instructions (Signed)
We will not be changing your medications at this time. Keep taking your medications as directed.   Recommend using acetaminophen (Tylenol) for pain instead of Ibuprofen (Advil) since this can interact with your blood pressure medications.

## 2016-09-01 DIAGNOSIS — Z9181 History of falling: Secondary | ICD-10-CM | POA: Diagnosis not present

## 2016-09-01 DIAGNOSIS — R2681 Unsteadiness on feet: Secondary | ICD-10-CM | POA: Diagnosis not present

## 2016-09-01 DIAGNOSIS — R2689 Other abnormalities of gait and mobility: Secondary | ICD-10-CM | POA: Diagnosis not present

## 2016-09-01 DIAGNOSIS — R4189 Other symptoms and signs involving cognitive functions and awareness: Secondary | ICD-10-CM | POA: Diagnosis not present

## 2016-09-01 DIAGNOSIS — M6281 Muscle weakness (generalized): Secondary | ICD-10-CM | POA: Diagnosis not present

## 2016-09-01 DIAGNOSIS — R29898 Other symptoms and signs involving the musculoskeletal system: Secondary | ICD-10-CM | POA: Diagnosis not present

## 2016-09-03 DIAGNOSIS — R2681 Unsteadiness on feet: Secondary | ICD-10-CM | POA: Diagnosis not present

## 2016-09-03 DIAGNOSIS — R29898 Other symptoms and signs involving the musculoskeletal system: Secondary | ICD-10-CM | POA: Diagnosis not present

## 2016-09-03 DIAGNOSIS — M6281 Muscle weakness (generalized): Secondary | ICD-10-CM | POA: Diagnosis not present

## 2016-09-03 DIAGNOSIS — R2689 Other abnormalities of gait and mobility: Secondary | ICD-10-CM | POA: Diagnosis not present

## 2016-09-03 DIAGNOSIS — R4189 Other symptoms and signs involving cognitive functions and awareness: Secondary | ICD-10-CM | POA: Diagnosis not present

## 2016-09-03 DIAGNOSIS — Z9181 History of falling: Secondary | ICD-10-CM | POA: Diagnosis not present

## 2016-09-07 DIAGNOSIS — Z9181 History of falling: Secondary | ICD-10-CM | POA: Diagnosis not present

## 2016-09-07 DIAGNOSIS — R29898 Other symptoms and signs involving the musculoskeletal system: Secondary | ICD-10-CM | POA: Diagnosis not present

## 2016-09-07 DIAGNOSIS — R2689 Other abnormalities of gait and mobility: Secondary | ICD-10-CM | POA: Diagnosis not present

## 2016-09-07 DIAGNOSIS — R2681 Unsteadiness on feet: Secondary | ICD-10-CM | POA: Diagnosis not present

## 2016-09-07 DIAGNOSIS — R4189 Other symptoms and signs involving cognitive functions and awareness: Secondary | ICD-10-CM | POA: Diagnosis not present

## 2016-09-07 DIAGNOSIS — M6281 Muscle weakness (generalized): Secondary | ICD-10-CM | POA: Diagnosis not present

## 2016-09-09 DIAGNOSIS — R29898 Other symptoms and signs involving the musculoskeletal system: Secondary | ICD-10-CM | POA: Diagnosis not present

## 2016-09-09 DIAGNOSIS — Z9181 History of falling: Secondary | ICD-10-CM | POA: Diagnosis not present

## 2016-09-09 DIAGNOSIS — R2681 Unsteadiness on feet: Secondary | ICD-10-CM | POA: Diagnosis not present

## 2016-09-09 DIAGNOSIS — R2689 Other abnormalities of gait and mobility: Secondary | ICD-10-CM | POA: Diagnosis not present

## 2016-09-09 DIAGNOSIS — M6281 Muscle weakness (generalized): Secondary | ICD-10-CM | POA: Diagnosis not present

## 2016-09-09 DIAGNOSIS — R4189 Other symptoms and signs involving cognitive functions and awareness: Secondary | ICD-10-CM | POA: Diagnosis not present

## 2016-09-15 DIAGNOSIS — M6281 Muscle weakness (generalized): Secondary | ICD-10-CM | POA: Diagnosis not present

## 2016-09-15 DIAGNOSIS — R2689 Other abnormalities of gait and mobility: Secondary | ICD-10-CM | POA: Diagnosis not present

## 2016-09-15 DIAGNOSIS — Z9181 History of falling: Secondary | ICD-10-CM | POA: Diagnosis not present

## 2016-09-15 DIAGNOSIS — R29898 Other symptoms and signs involving the musculoskeletal system: Secondary | ICD-10-CM | POA: Diagnosis not present

## 2016-09-15 DIAGNOSIS — R4189 Other symptoms and signs involving cognitive functions and awareness: Secondary | ICD-10-CM | POA: Diagnosis not present

## 2016-09-15 DIAGNOSIS — R2681 Unsteadiness on feet: Secondary | ICD-10-CM | POA: Diagnosis not present

## 2016-09-17 DIAGNOSIS — R4189 Other symptoms and signs involving cognitive functions and awareness: Secondary | ICD-10-CM | POA: Diagnosis not present

## 2016-09-17 DIAGNOSIS — R2689 Other abnormalities of gait and mobility: Secondary | ICD-10-CM | POA: Diagnosis not present

## 2016-09-17 DIAGNOSIS — R2681 Unsteadiness on feet: Secondary | ICD-10-CM | POA: Diagnosis not present

## 2016-09-17 DIAGNOSIS — M6281 Muscle weakness (generalized): Secondary | ICD-10-CM | POA: Diagnosis not present

## 2016-09-17 DIAGNOSIS — R29898 Other symptoms and signs involving the musculoskeletal system: Secondary | ICD-10-CM | POA: Diagnosis not present

## 2016-09-17 DIAGNOSIS — Z9181 History of falling: Secondary | ICD-10-CM | POA: Diagnosis not present

## 2016-09-22 DIAGNOSIS — M818 Other osteoporosis without current pathological fracture: Secondary | ICD-10-CM | POA: Diagnosis not present

## 2016-09-22 DIAGNOSIS — I255 Ischemic cardiomyopathy: Secondary | ICD-10-CM | POA: Diagnosis not present

## 2016-09-22 DIAGNOSIS — R4189 Other symptoms and signs involving cognitive functions and awareness: Secondary | ICD-10-CM | POA: Diagnosis not present

## 2016-09-22 DIAGNOSIS — M6281 Muscle weakness (generalized): Secondary | ICD-10-CM | POA: Diagnosis not present

## 2016-09-22 DIAGNOSIS — R2689 Other abnormalities of gait and mobility: Secondary | ICD-10-CM | POA: Diagnosis not present

## 2016-09-22 DIAGNOSIS — E871 Hypo-osmolality and hyponatremia: Secondary | ICD-10-CM | POA: Diagnosis not present

## 2016-09-22 DIAGNOSIS — R278 Other lack of coordination: Secondary | ICD-10-CM | POA: Diagnosis not present

## 2016-09-22 DIAGNOSIS — N3941 Urge incontinence: Secondary | ICD-10-CM | POA: Diagnosis not present

## 2016-09-22 DIAGNOSIS — Z9181 History of falling: Secondary | ICD-10-CM | POA: Diagnosis not present

## 2016-09-22 DIAGNOSIS — R29898 Other symptoms and signs involving the musculoskeletal system: Secondary | ICD-10-CM | POA: Diagnosis not present

## 2016-09-22 DIAGNOSIS — M545 Low back pain: Secondary | ICD-10-CM | POA: Diagnosis not present

## 2016-09-22 DIAGNOSIS — F3289 Other specified depressive episodes: Secondary | ICD-10-CM | POA: Diagnosis not present

## 2016-09-22 DIAGNOSIS — R2681 Unsteadiness on feet: Secondary | ICD-10-CM | POA: Diagnosis not present

## 2016-09-29 DIAGNOSIS — M6281 Muscle weakness (generalized): Secondary | ICD-10-CM | POA: Diagnosis not present

## 2016-09-29 DIAGNOSIS — R2681 Unsteadiness on feet: Secondary | ICD-10-CM | POA: Diagnosis not present

## 2016-09-29 DIAGNOSIS — N3941 Urge incontinence: Secondary | ICD-10-CM | POA: Diagnosis not present

## 2016-09-29 DIAGNOSIS — R278 Other lack of coordination: Secondary | ICD-10-CM | POA: Diagnosis not present

## 2016-09-29 DIAGNOSIS — R4189 Other symptoms and signs involving cognitive functions and awareness: Secondary | ICD-10-CM | POA: Diagnosis not present

## 2016-09-29 DIAGNOSIS — R2689 Other abnormalities of gait and mobility: Secondary | ICD-10-CM | POA: Diagnosis not present

## 2016-10-05 DIAGNOSIS — R2681 Unsteadiness on feet: Secondary | ICD-10-CM | POA: Diagnosis not present

## 2016-10-05 DIAGNOSIS — N3941 Urge incontinence: Secondary | ICD-10-CM | POA: Diagnosis not present

## 2016-10-05 DIAGNOSIS — R2689 Other abnormalities of gait and mobility: Secondary | ICD-10-CM | POA: Diagnosis not present

## 2016-10-05 DIAGNOSIS — R4189 Other symptoms and signs involving cognitive functions and awareness: Secondary | ICD-10-CM | POA: Diagnosis not present

## 2016-10-05 DIAGNOSIS — M6281 Muscle weakness (generalized): Secondary | ICD-10-CM | POA: Diagnosis not present

## 2016-10-05 DIAGNOSIS — R278 Other lack of coordination: Secondary | ICD-10-CM | POA: Diagnosis not present

## 2016-10-09 DIAGNOSIS — R4189 Other symptoms and signs involving cognitive functions and awareness: Secondary | ICD-10-CM | POA: Diagnosis not present

## 2016-10-09 DIAGNOSIS — M6281 Muscle weakness (generalized): Secondary | ICD-10-CM | POA: Diagnosis not present

## 2016-10-09 DIAGNOSIS — R278 Other lack of coordination: Secondary | ICD-10-CM | POA: Diagnosis not present

## 2016-10-09 DIAGNOSIS — R2689 Other abnormalities of gait and mobility: Secondary | ICD-10-CM | POA: Diagnosis not present

## 2016-10-09 DIAGNOSIS — R2681 Unsteadiness on feet: Secondary | ICD-10-CM | POA: Diagnosis not present

## 2016-10-09 DIAGNOSIS — N3941 Urge incontinence: Secondary | ICD-10-CM | POA: Diagnosis not present

## 2016-10-14 DIAGNOSIS — M6281 Muscle weakness (generalized): Secondary | ICD-10-CM | POA: Diagnosis not present

## 2016-10-14 DIAGNOSIS — R2681 Unsteadiness on feet: Secondary | ICD-10-CM | POA: Diagnosis not present

## 2016-10-14 DIAGNOSIS — R4189 Other symptoms and signs involving cognitive functions and awareness: Secondary | ICD-10-CM | POA: Diagnosis not present

## 2016-10-14 DIAGNOSIS — R2689 Other abnormalities of gait and mobility: Secondary | ICD-10-CM | POA: Diagnosis not present

## 2016-10-14 DIAGNOSIS — N3941 Urge incontinence: Secondary | ICD-10-CM | POA: Diagnosis not present

## 2016-10-14 DIAGNOSIS — R278 Other lack of coordination: Secondary | ICD-10-CM | POA: Diagnosis not present

## 2016-11-23 ENCOUNTER — Telehealth: Payer: Self-pay | Admitting: Cardiology

## 2016-11-23 MED ORDER — AMLODIPINE BESYLATE 5 MG PO TABS: 7.5000 mg | ORAL_TABLET | Freq: Every day | ORAL | 3 refills | Status: DC

## 2016-11-23 NOTE — Telephone Encounter (Signed)
Mrs. Mcconnon is calling because her blood pressure has elevated , asking to speak with a nurse . Please call

## 2016-11-23 NOTE — Telephone Encounter (Signed)
Have her increase amlodipine to 7.5mg  daily and check BP 2 hours after taking meds daily for 1 week.  Set up OV with HTN clinic in 1 week and bring BP readings with her

## 2016-11-23 NOTE — Telephone Encounter (Signed)
Instructed patient to INCREASE AMLODIPINE to 7.5 mg daily. She will check her BP daily 2 hours after taking her medications and keep a log. She will being log to HTN Clinic OV 2/14. She was grateful for assistance.

## 2016-11-23 NOTE — Telephone Encounter (Signed)
Patient states her BP is now 160/60 (roughly 2 hours after taking morning meds). She reports her BP has been slowly increasing over the last month or so from 130s/60s to the highest which was today's reading. About 2 weeks ago, she increased her Ramipril to 20 mg daily on her own to control HTN. She has no symptoms. She had her BP cuff checked by a nurse at [facility] and was told it was accurate.  She understands she will be called with Dr. Theodosia Blender medication recommendations.

## 2016-11-23 NOTE — Telephone Encounter (Signed)
Patient states her BP is 177/67 now.  She took her morning medications about 20 minutes ago. She denies any other symptoms - only high BP. Instructed her to give the medicine time to work and that she will be called around noon to get another BP reading. She was grateful for call.

## 2016-12-02 ENCOUNTER — Ambulatory Visit: Payer: Medicare Other

## 2016-12-02 ENCOUNTER — Telehealth: Payer: Self-pay | Admitting: Cardiology

## 2016-12-02 NOTE — Telephone Encounter (Signed)
New Message     Pt would like to speak to your or Dr Radford Pax about her bp

## 2016-12-02 NOTE — Progress Notes (Deleted)
Patient ID: [name]                 DOB: 04/18/1923                      MRN: [id number]     HPI: [name] is a 83 y.o. female patient of Dr. Radford Pax who presents today for hypertension follow up. PMH of Takotsubo cardiomyopathy, MI, Vtach, Afib, HTN, HLD, chronic LLE edema. She called recently with pressures that were trending up and her amlodipine was increased to 7.5mg  daily.    Current HTN meds:  Amlodipine 7.5mg  daily Ramipril 20mg  daily Carvedilol 12.5mg  BID  Previously tried: ramipril 20 mg daily (d/c'd due to low BP), amlodipine (d/c'd in the past due to edema), clonidine 0.1 mg BID (d/c'd several years ago) BP goal: <150/90 mmHg  Family History: Cancer (mother)  Social History: Non-smoker, denies alcohol and drug abuse.   Exercise: Exercises twice a week at the gym with a therapist  Home BP readings: Patient occasionally checks BP at home. Her BP averages 130-140s/60s.  Wt Readings from Last 3 Encounters:  08/19/16 120 lb (54.4 kg)  03/03/16 124 lb 12.8 oz (56.6 kg)  08/28/15 122 lb 3.2 oz (55.4 kg)   BP Readings from Last 3 Encounters:  08/28/16 (!) 148/60  08/19/16 (!) 102/58  03/03/16 (!) 130/56   Pulse Readings from Last 3 Encounters:  08/28/16 (!) 56  08/19/16 (!) 56  03/03/16 60    Renal function: CrCl cannot be calculated (Patient's most recent lab result is older than the maximum 21 days allowed.).  Past Medical History:  Diagnosis Date  . Amputation of finger of right hand   . Aortic stenosis, mild 03/03/2016   By echo 08/2015  . Arthritis   . Back pain   . Bradycardia 08/28/2015  . CKD (chronic kidney disease) stage 3, GFR 30-59 ml/min   . Depression   . GERD (gastroesophageal reflux disease)   . GI bleed due to NSAIDs   . Heart murmur   . Hyperlipidemia   . Hypertension   . Macular degeneration   . Maxillary sinusitis   . MVP (mitral valve prolapse) 03/03/2016   By echo 08/2015  . Old MI (myocardial  infarction)    NSTEMI secondary to stress MI/Takotsubo CM, minimal nonobstructive ASCAD at cath  . Osteoporosis   . Persistent atrial fibrillation ([place]) 2006   not on anticoagulation due to GI bleed and increased fall risk  . PMR (polymyalgia rheumatica) (HCC)   . SIADH (syndrome of inappropriate ADH production) ([place])   . Takotsubo cardiomyopathy    EF normalized to 70%  . Ventricular tachycardia ([place])    on initial presentation of MI    Current Outpatient Prescriptions on File Prior to Visit  Medication Sig Dispense Refill  . acetaminophen (TYLENOL) 500 MG tablet Take 500 mg by mouth every 6 (six) hours as needed for moderate pain.    Marland Kitchen amLODipine (NORVASC) 5 MG tablet Take 1.5 tablets (7.5 mg total) by mouth daily. 135 tablet 3  . aspirin EC 81 MG tablet Take 81 mg by mouth at bedtime.     Marland Kitchen azelastine (ASTELIN) 0.1 % nasal spray Place 2 sprays into both nostrils 2 (two) times daily. Use in each nostril as directed    . buPROPion (WELLBUTRIN XL) 150 MG 24 hr tablet Take 150 mg by mouth 2 (two) times daily.     . carvedilol (COREG) 12.5 MG tablet  12.5 mg. Take 1/2 tablet by mouth twice daily  3  . cetirizine (ZYRTEC) 10 MG tablet Take 10 mg by mouth daily.     Marland Kitchen demeclocycline (DECLOMYCIN) 150 MG tablet Take 150 mg by mouth 2 (two) times daily.     . diclofenac sodium (VOLTAREN) 1 % GEL Apply 1 application topically 4 (four) times daily as needed. Pain     . fish oil-omega-3 fatty acids 1000 MG capsule Take 1 g by mouth daily.     Marland Kitchen ibuprofen (ADVIL,MOTRIN) 200 MG tablet Take 200 mg by mouth 3 (three) times daily.    . methocarbamol (ROBAXIN) 500 MG tablet Take 500 mg by mouth as needed for muscle spasms.    . Multiple Vitamins-Minerals (CENTRUM CARDIO PO) Take 1 tablet by mouth 2 (two) times daily.     Marland Kitchen omeprazole (PRILOSEC) 20 MG capsule Take 20 mg by mouth daily.     . pregabalin (LYRICA) 50 MG capsule Take 50 mg by mouth daily.     . ramipril (ALTACE) 10 MG capsule Take 1 capsule  (10 mg total) by mouth daily after breakfast. (Patient taking differently: Take 20 mg by mouth daily after breakfast. ) 30 capsule 11  . traMADol (ULTRAM-ER) 300 MG 24 hr tablet Take 300 mg by mouth daily as needed for pain.      No current facility-administered medications on file prior to visit.     Allergies  Allergen Reactions  . Amlodipine Swelling    To feet and ankles   . Avelox [Moxifloxacin Hcl In Nacl] Other (See Comments)    Pt does not remember   . Duragesic Disc Transdermal System [Alcohol-Fentanyl] Other (See Comments)    Reaction unknown  . Morphine And Related Other (See Comments)    "My Sister and daughter can,t take it. I've had it before without problems."  . Teriparatide (Recombinant) Other (See Comments)    Made skin dry  . Lidocaine Rash    There were no vitals taken for this visit.   Assessment/Plan: Hypertension:    Thank you, Lelan Pons. Patterson Hammersmith, [facility]  12/02/2016 7:54 AM

## 2016-12-02 NOTE — Telephone Encounter (Signed)
Left message to call back  

## 2016-12-03 NOTE — Telephone Encounter (Signed)
Patient reports her BP is still elevated in the mornings, but she has been taking her BP prior to taking her medications. She has no readings to report at this time. Instructed patient to take her BP 2 hours after taking her medications and to bring her BP log to HTN Clinic appointment 2/20. She is currently taking Amlodipine 7.5 mg daily and Ramipril 20 mg daily. She will continue regimen until seen in the HTN Clinic. She was grateful for call and agrees with treatment plan.

## 2016-12-08 ENCOUNTER — Encounter (INDEPENDENT_AMBULATORY_CARE_PROVIDER_SITE_OTHER): Payer: Self-pay

## 2016-12-08 ENCOUNTER — Ambulatory Visit (INDEPENDENT_AMBULATORY_CARE_PROVIDER_SITE_OTHER): Payer: Medicare Other | Admitting: Pharmacist

## 2016-12-08 ENCOUNTER — Encounter: Payer: Self-pay | Admitting: Pharmacist

## 2016-12-08 VITALS — BP 144/62 | HR 57

## 2016-12-08 DIAGNOSIS — I10 Essential (primary) hypertension: Secondary | ICD-10-CM | POA: Diagnosis not present

## 2016-12-08 MED ORDER — AMLODIPINE BESYLATE 2.5 MG PO TABS: 2.5000 mg | ORAL_TABLET | Freq: Every day | ORAL | 3 refills | Status: DC

## 2016-12-08 MED ORDER — AMLODIPINE BESYLATE 5 MG PO TABS: 5.0000 mg | ORAL_TABLET | Freq: Every day | ORAL | 3 refills | Status: DC

## 2016-12-08 NOTE — Progress Notes (Signed)
Patient ID: [name]                 DOB: July 16, 1923                      MRN: [id number]     HPI: [name] is a 83 y.o. female patient of Dr. Radford Pax who presents today for hypertension evaluation. PMH of Takotsubo cardiomyopathy, MI, Vtach, Afib, HTN, HLD, chronic LLE edema. She was seen by Dr. Radford Pax in Nov 2017 and her ramipril was decreased secondary to low pressures. Since this time she has called reporting increasing pressures. She self titrated her ramipril back to 20mg  daily and her amlodipine was increased to 7.5mg  daily. Her cuff was verified by nurse at [facility].   She reports today that her pressures have been much better following the dose increase of amlodipine. She states she has had some swelling in her left ankle but she believes this is related to an injury that she irritated again recently.   She is somewhat concerned that her pressure is elevated in the morning before medications. Her log shows that her pressures are usually 140-150s/70s in the morning before medications and 120-130s/60-70s after medications.    Current HTN meds: Carvedilol 12.5 mg BID, amlodipine 7.5 mg daily, ramipril 20 mg daily  Previously tried: ramipril 20 mg daily (d/c'd due to low BP), amlodipine (d/c'd in the past due to edema), clonidine 0.1 mg BID (d/c'd several years ago)  BP goal: <150/90 mmHg  Family History: Cancer (mother)  Social History: Non-smoker, denies alcohol and drug abuse.   Exercise: Exercises twice a week at the gym with a therapist  Home BP readings: see above.   Wt Readings from Last 3 Encounters:  08/19/16 120 lb (54.4 kg)  03/03/16 124 lb 12.8 oz (56.6 kg)  08/28/15 122 lb 3.2 oz (55.4 kg)   BP Readings from Last 3 Encounters:  12/08/16 (!) 144/62  08/28/16 (!) 148/60  08/19/16 (!) 102/58   Pulse Readings from Last 3 Encounters:  12/08/16 (!) 57  08/28/16 (!) 56  08/19/16 (!) 56    Renal function: CrCl cannot be  calculated (Patient's most recent lab result is older than the maximum 21 days allowed.).  Past Medical History:  Diagnosis Date  . Amputation of finger of right hand   . Aortic stenosis, mild 03/03/2016   By echo 08/2015  . Arthritis   . Back pain   . Bradycardia 08/28/2015  . CKD (chronic kidney disease) stage 3, GFR 30-59 ml/min   . Depression   . GERD (gastroesophageal reflux disease)   . GI bleed due to NSAIDs   . Heart murmur   . Hyperlipidemia   . Hypertension   . Macular degeneration   . Maxillary sinusitis   . MVP (mitral valve prolapse) 03/03/2016   By echo 08/2015  . Old MI (myocardial infarction)    NSTEMI secondary to stress MI/Takotsubo CM, minimal nonobstructive ASCAD at cath  . Osteoporosis   . Persistent atrial fibrillation ([place]) 2006   not on anticoagulation due to GI bleed and increased fall risk  . PMR (polymyalgia rheumatica) (HCC)   . SIADH (syndrome of inappropriate ADH production) ([place])   . Takotsubo cardiomyopathy    EF normalized to 70%  . Ventricular tachycardia ([place])    on initial presentation of MI    Current Outpatient Prescriptions on File Prior to Visit  Medication Sig Dispense Refill  . aspirin EC 81  MG tablet Take 81 mg by mouth at bedtime.     Marland Kitchen azelastine (ASTELIN) 0.1 % nasal spray Place 2 sprays into both nostrils 2 (two) times daily. Use in each nostril as directed    . buPROPion (WELLBUTRIN XL) 150 MG 24 hr tablet Take 150 mg by mouth 2 (two) times daily.     . carvedilol (COREG) 12.5 MG tablet 12.5 mg. Take 1/2 tablet by mouth twice daily  3  . cetirizine (ZYRTEC) 10 MG tablet Take 10 mg by mouth daily.     Marland Kitchen demeclocycline (DECLOMYCIN) 150 MG tablet Take 150 mg by mouth 2 (two) times daily.     . diclofenac sodium (VOLTAREN) 1 % GEL Apply 1 application topically 4 (four) times daily as needed. Pain     . fish oil-omega-3 fatty acids 1000 MG capsule Take 1 g by mouth daily.     Marland Kitchen ibuprofen (ADVIL,MOTRIN) 200 MG tablet Take 200 mg by  mouth 3 (three) times daily.    . Multiple Vitamins-Minerals (CENTRUM CARDIO PO) Take 1 tablet by mouth 2 (two) times daily.     Marland Kitchen omeprazole (PRILOSEC) 20 MG capsule Take 20 mg by mouth daily.     . pregabalin (LYRICA) 50 MG capsule Take 50 mg by mouth daily.     . ramipril (ALTACE) 10 MG capsule Take 1 capsule (10 mg total) by mouth daily after breakfast. (Patient taking differently: Take 20 mg by mouth daily after breakfast. ) 30 capsule 11  . traMADol (ULTRAM-ER) 300 MG 24 hr tablet Take 300 mg by mouth daily as needed for pain.     . methocarbamol (ROBAXIN) 500 MG tablet Take 500 mg by mouth as needed for muscle spasms.     No current facility-administered medications on file prior to visit.     Allergies  Allergen Reactions  . Amlodipine Swelling    To feet and ankles   . Avelox [Moxifloxacin Hcl In Nacl] Other (See Comments)    Pt does not remember   . Duragesic Disc Transdermal System [Alcohol-Fentanyl] Other (See Comments)    Reaction unknown  . Morphine And Related Other (See Comments)    "My Sister and daughter can,t take it. I've had it before without problems."  . Teriparatide (Recombinant) Other (See Comments)    Made skin dry  . Lidocaine Rash    Blood pressure (!) 144/62, pulse (!) 57.   Assessment/Plan: Hypertension: BP at less strict goal due to age. Will move morning amlodipine to evening to better space medications and hopefully decrease morning spikes. No other medication changes made. Patient requests RX for amlodipine 5mg  and 2.5mg  as she is unable to cut tablet. This has been sent. Follow up with Dr. Radford Pax as scheduled and HTN clinic as needed.    Thank you, Lelan Pons. Patterson Hammersmith, [facility]  12/08/2016 12:13 PM

## 2016-12-08 NOTE — Patient Instructions (Addendum)
Start taking amlodipine in the evening  Continue all of your medications as prescribed - we have sent a prescription for amlodipine 5mg  and 2.5mg  to be taken together so that you do not need to cut them.   Please call if you notice any additional swelling in your legs.

## 2017-03-02 ENCOUNTER — Encounter (INDEPENDENT_AMBULATORY_CARE_PROVIDER_SITE_OTHER): Payer: Self-pay

## 2017-03-02 ENCOUNTER — Ambulatory Visit (INDEPENDENT_AMBULATORY_CARE_PROVIDER_SITE_OTHER): Payer: Medicare Other | Admitting: Cardiology

## 2017-03-02 ENCOUNTER — Encounter: Payer: Self-pay | Admitting: Cardiology

## 2017-03-02 VITALS — BP 130/62 | HR 59 | Ht 63.0 in | Wt 126.0 lb

## 2017-03-02 DIAGNOSIS — I350 Nonrheumatic aortic (valve) stenosis: Secondary | ICD-10-CM | POA: Diagnosis not present

## 2017-03-02 DIAGNOSIS — I341 Nonrheumatic mitral (valve) prolapse: Secondary | ICD-10-CM

## 2017-03-02 DIAGNOSIS — E7800 Pure hypercholesterolemia, unspecified: Secondary | ICD-10-CM

## 2017-03-02 DIAGNOSIS — I5181 Takotsubo syndrome: Secondary | ICD-10-CM

## 2017-03-02 DIAGNOSIS — I10 Essential (primary) hypertension: Secondary | ICD-10-CM

## 2017-03-02 DIAGNOSIS — I4819 Other persistent atrial fibrillation: Secondary | ICD-10-CM

## 2017-03-02 DIAGNOSIS — I481 Persistent atrial fibrillation: Secondary | ICD-10-CM | POA: Diagnosis not present

## 2017-03-02 MED ORDER — AMLODIPINE BESYLATE 2.5 MG PO TABS: 2.5000 mg | ORAL_TABLET | Freq: Every day | ORAL | 3 refills | Status: DC

## 2017-03-02 NOTE — Progress Notes (Signed)
Cardiology Office Note    Date:  03/02/2017   ID:  [name], DOB 04-13-1923, MRN [id number]  PCP:  Lajean Manes, MD  Cardiologist:  Fransico Him, MD   Chief Complaint  Patient presents with  . Cardiomyopathy  . Hypertension  . Atrial Fibrillation    History of Present Illness:  [name] is a 83 y.o. female  with history of stress induced MI, Takotsubo DCM with normalized EF, hypertensive heart disease and presistent atrial fibrillation who presents today for followup. She denies any chest pain or palpitations.She does occasionally notice some DOE when her husband wants her to walk faster with her walker. She has not had any dizziness, falls or syncope. She says that her BP has been better controlled on the higher dose of amlodipine.  She does occasionally has some mild LE edema but is quite sedentary and sits with her legs hanging down a lot. She says that the other day her legs swelled up bad but she had eaten french onion soup. They have gone down since then.     Past Medical History:  Diagnosis Date  . Amputation of finger of right hand   . Aortic stenosis, mild 03/03/2016   By echo 08/2015  . Arthritis   . Back pain   . Bradycardia 08/28/2015  . CKD (chronic kidney disease) stage 3, GFR 30-59 ml/min   . Depression   . GERD (gastroesophageal reflux disease)   . GI bleed due to NSAIDs   . Heart murmur   . Hyperlipidemia   . Hypertension   . Macular degeneration   . Maxillary sinusitis   . MVP (mitral valve prolapse) 03/03/2016   By echo 08/2015  . Old MI (myocardial infarction)    NSTEMI secondary to stress MI/Takotsubo CM, minimal nonobstructive ASCAD at cath  . Osteoporosis   . Persistent atrial fibrillation ([place]) 2006   not on anticoagulation due to GI bleed and increased fall risk  . PMR (polymyalgia rheumatica) (HCC)   . SIADH (syndrome of inappropriate ADH production) ([place])   . Takotsubo cardiomyopathy    EF normalized to 70%    . Ventricular tachycardia ([place])    on initial presentation of MI    Past Surgical History:  Procedure Laterality Date  . CARDIAC CATHETERIZATION     normal coronary arteries  . CHOLECYSTECTOMY  11/10/2011   Procedure: LAPAROSCOPIC CHOLECYSTECTOMY WITH INTRAOPERATIVE CHOLANGIOGRAM;  Surgeon: Pedro Earls, MD;  Location: [facility]S;  Service: General;  Laterality: N/A;  . EYE SURGERY  06/10/11   membrane peel   . NASAL SINUS SURGERY Left   . ROTATOR CUFF REPAIR  1998  . UMBILICAL HERNIA REPAIR  11/10/2011   Procedure: HERNIA REPAIR UMBILICAL ADULT;  Surgeon: Pedro Earls, MD;  Location: [facility];  Service: General;  Laterality: N/A;  . WISDOM TOOTH EXTRACTION      Current Medications: Current Meds  Medication Sig  . amLODipine (NORVASC) 2.5 MG tablet Take 1 tablet (2.5 mg total) by mouth daily. Take 1 tablet with the 5mg  tablet daily (for a total daily dose of 7.5mg )  . amLODipine (NORVASC) 5 MG tablet Take 1 tablet (5 mg total) by mouth daily. Take 1 tablet with the 2.5mg  tablet daily (for a total daily dose of 7.5mg )  . aspirin EC 81 MG tablet Take 81 mg by mouth at bedtime.   Marland Kitchen azelastine (ASTELIN) 0.1 % nasal spray Place 2 sprays into both nostrils 2 (two) times daily. Use in each  nostril as directed  . buPROPion (WELLBUTRIN XL) 150 MG 24 hr tablet Take 150 mg by mouth 2 (two) times daily.   . carvedilol (COREG) 12.5 MG tablet 12.5 mg. Take 1/2 tablet by mouth twice daily  . cetirizine (ZYRTEC) 10 MG tablet Take 10 mg by mouth daily.   Marland Kitchen demeclocycline (DECLOMYCIN) 150 MG tablet Take 150 mg by mouth 2 (two) times daily.   . diclofenac sodium (VOLTAREN) 1 % GEL Apply 1 application topically 4 (four) times daily as needed. Pain   . fish oil-omega-3 fatty acids 1000 MG capsule Take 1 g by mouth daily.   Marland Kitchen ibuprofen (ADVIL,MOTRIN) 200 MG tablet Take 200 mg by mouth 3 (three) times daily.  . methocarbamol (ROBAXIN) 500 MG tablet Take 500 mg by mouth as needed for muscle spasms.  .  Multiple Vitamins-Minerals (CENTRUM CARDIO PO) Take 1 tablet by mouth 2 (two) times daily.   Marland Kitchen omeprazole (PRILOSEC) 20 MG capsule Take 20 mg by mouth daily.   . pregabalin (LYRICA) 50 MG capsule Take 50 mg by mouth daily.   . ramipril (ALTACE) 10 MG capsule Take 1 capsule (10 mg total) by mouth daily after breakfast. (Patient taking differently: Take 20 mg by mouth daily after breakfast. )  . traMADol (ULTRAM-ER) 300 MG 24 hr tablet Take 300 mg by mouth daily as needed for pain.     Allergies:   Amlodipine; Avelox [moxifloxacin hcl in nacl]; Duragesic disc transdermal system [alcohol-fentanyl]; Morphine and related; Teriparatide (recombinant); and Lidocaine   Social History   Social History  . Marital status: Married    Spouse name: N/A  . Number of children: N/A  . Years of education: N/A   Social History Main Topics  . Smoking status: Never Smoker  . Smokeless tobacco: Never Used  . Alcohol use No     Comment: occassionally  . Drug use: No  . Sexual activity: Not Asked   Other Topics Concern  . None   Social History Narrative  . None     Family History:  The patient's family history includes Cancer in her mother.   ROS:   Please see the history of present illness.    ROS All other systems reviewed and are negative.  No flowsheet data found.     PHYSICAL EXAM:   VS:  BP 130/62   Pulse (!) 59   Ht 5\' 3"  (1.6 m)   Wt 126 lb (57.2 kg)   SpO2 98%   BMI 22.32 kg/m    GEN: Well nourished, well developed, in no acute distress  HEENT: normal  Neck: no JVD, carotid bruits, or masses Cardiac: RRR; no murmurs, rubs, or gallops,no edema.  Intact distal pulses bilaterally.  Respiratory:  clear to auscultation bilaterally, normal work of breathing GI: soft, nontender, nondistended, + BS MS: no deformity or atrophy  Skin: warm and dry, no rash Neuro:  Alert and Oriented x 3, Strength and sensation are intact Psych: euthymic mood, full affect  Wt Readings from Last 3  Encounters:  03/02/17 126 lb (57.2 kg)  08/19/16 120 lb (54.4 kg)  03/03/16 124 lb 12.8 oz (56.6 kg)      Studies/Labs Reviewed:   EKG:  EKG is not ordered today.    Recent Labs: 08/19/2016: BUN 24; Creat 1.17; Potassium 5.0; Sodium 140   Lipid Panel No results found for: CHOL, TRIG, HDL, CHOLHDL, VLDL, LDLCALC, LDLDIRECT  Additional studies/ records that were reviewed today include:  none    ASSESSMENT:  1. Takotsubo cardiomyopathy   2. Essential hypertension, benign   3. Persistent atrial fibrillation ([place])   4. Aortic stenosis, mild   5. Pure hypercholesterolemia   6. MVP (mitral valve prolapse)      PLAN:  In order of problems listed above:  1. Takotsubo DCM - EF has normalized.  She will continue on BB and ACE I.  I will check a BMET. 2. HTN - BP controlled on exam today.  She will continue on amlodipine 7.5mg  daily, carvedilol 6.25mg  BID, Altace 10mg  daily. 3. Persistent atrial fibrillation - she is maintaining NSR on exam.  She will continue on Carvedilol.  She is not on anticoagulation due to high fall risk.  4. Mild Aortic stenosis by echo 2016.  She is asymptomatic.  5. MVP with mild MR by echo 2016. 6. MVP with mild MR -by echo 2016    Medication Adjustments/Labs and Tests Ordered: Current medicines are reviewed at length with the patient today.  Concerns regarding medicines are outlined above.  Medication changes, Labs and Tests ordered today are listed in the Patient Instructions below.  There are no Patient Instructions on file for this visit.   Signed, Fransico Him, MD  03/02/2017 1:25 PM    [facility] [place], [place], [place]  [address] Phone: [phone]; Fax: [phone]

## 2017-03-02 NOTE — Patient Instructions (Signed)

## 2017-03-03 LAB — BASIC METABOLIC PANEL
BUN/Creatinine Ratio: 27 (ref 12–28)
BUN: 36 mg/dL (ref 10–36)
CALCIUM: 9.5 mg/dL (ref 8.7–10.3)
CO2: 26 mmol/L (ref 18–29)
CREATININE: 1.35 mg/dL — AB (ref 0.57–1.00)
Chloride: 101 mmol/L (ref 96–106)
GFR calc Af Amer: 39 mL/min/{1.73_m2} — ABNORMAL LOW (ref >59)
GFR, EST NON AFRICAN AMERICAN: 34 mL/min/{1.73_m2} — AB (ref >59)
Glucose: 100 mg/dL — ABNORMAL HIGH (ref 65–99)
Potassium: 5.5 mmol/L — ABNORMAL HIGH (ref 3.5–5.2)
SODIUM: 138 mmol/L (ref 134–144)

## 2017-03-09 ENCOUNTER — Encounter: Payer: Self-pay | Admitting: *Deleted

## 2017-03-09 ENCOUNTER — Other Ambulatory Visit: Payer: Self-pay | Admitting: *Deleted

## 2017-03-09 DIAGNOSIS — I10 Essential (primary) hypertension: Secondary | ICD-10-CM

## 2017-03-09 MED ORDER — AMLODIPINE BESYLATE 10 MG PO TABS: 10.0000 mg | ORAL_TABLET | Freq: Every day | ORAL | 3 refills | Status: DC

## 2017-03-09 NOTE — Telephone Encounter (Signed)
This encounter was created in error - please disregard.

## 2017-03-09 NOTE — Telephone Encounter (Signed)
Pt's husband and son aware of labs results and MD's recommendations. Altace 10 mg discontinued as per MD. Amlodipine dose was increased to 10 mg daily. An appointment was made for pt in the HTN clinic and labs BMET on June 5 th 2018. Husband and son aware.

## 2017-03-23 ENCOUNTER — Other Ambulatory Visit: Payer: Medicare Other | Admitting: *Deleted

## 2017-03-23 ENCOUNTER — Ambulatory Visit (INDEPENDENT_AMBULATORY_CARE_PROVIDER_SITE_OTHER): Payer: Medicare Other | Admitting: Pharmacist

## 2017-03-23 VITALS — BP 118/62 | HR 60

## 2017-03-23 DIAGNOSIS — I270 Primary pulmonary hypertension: Secondary | ICD-10-CM

## 2017-03-23 DIAGNOSIS — I10 Essential (primary) hypertension: Secondary | ICD-10-CM

## 2017-03-23 DIAGNOSIS — I5181 Takotsubo syndrome: Secondary | ICD-10-CM | POA: Diagnosis not present

## 2017-03-23 LAB — BASIC METABOLIC PANEL
BUN/Creatinine Ratio: 21 (ref 12–28)
BUN: 20 mg/dL (ref 10–36)
CALCIUM: 9.4 mg/dL (ref 8.7–10.3)
CO2: 24 mmol/L (ref 18–29)
CREATININE: 0.97 mg/dL (ref 0.57–1.00)
Chloride: 102 mmol/L (ref 96–106)
GFR calc Af Amer: 59 mL/min/{1.73_m2} — ABNORMAL LOW (ref >59)
GFR, EST NON AFRICAN AMERICAN: 51 mL/min/{1.73_m2} — AB (ref >59)
GLUCOSE: 101 mg/dL — AB (ref 65–99)
Potassium: 4.9 mmol/L (ref 3.5–5.2)
Sodium: 140 mmol/L (ref 134–144)

## 2017-03-23 MED ORDER — FUROSEMIDE 20 MG PO TABS: ORAL_TABLET | ORAL | 0 refills | Status: DC

## 2017-03-23 MED ORDER — AMLODIPINE BESYLATE 10 MG PO TABS: 5.0000 mg | ORAL_TABLET | Freq: Every day | ORAL | 3 refills | Status: DC

## 2017-03-23 NOTE — Progress Notes (Signed)
Patient ID: JOVONDA SELNER                 DOB: Dec 20, 1922                      MRN: [id number]     HPI: SRITHA CHAUNCEY is a 83 y.o. female referred by Dr. Radford Pax to HTN clinic. PMH is significant for Takotsubo cardiomyopathy, MI, Vtach, Afib, HTN, HLD, chronic LLE edema. Her last visit with Dr. Radford Pax was 5/15 with BMet that was abnormal for an increase in Scr to 1.35 (13% increase) and potassium increase to 5.5 (baseline ~5). Patient was instructed to increase amlodipine to 10 mg and discontinue ramipril by Dr. Radford Pax, which the patient did on 03/09/17.   Today, she complains of SOB during walking that has increased in frequency, and has noted an increase in LE edema (in ankles). Normally, this edema is the lowest in the mornings and increases throughout the day. However, she has noticed consistent "puffiness", including in the mornings. This is concerning given recent increase in amlodipine with noted history of edema with this medication. She doesn't weigh herself at home daily, so cannot report on any weight gain. Previously, she was on Lasix but was discontinued due to dehydration.   She denies HA and dizziness. At home, she alternates being using a walker and cane, and has had 0 falls. We discussed at length the reasoning for increasing her BP goal to be more lenient given age and fall risk.   Current HTN meds:  Carvedilol 6.25 mg BID Amlodipine 10 mg daily  Previously tried: ramipril 20 mg daily (d/c'd due to low BP), amlodipine (d/c'd in the past due to edema), clonidine 0.1 mg BID (d/c'd several years ago), furosemide (d/c'd in past due to dehydration)  BP goal: < 150/90  Family History: Cancer (mother)  Social History: Non-smoker, denies alcohol and drug abuse.  Home BP readings: Typically 130s/60s since med changes.   Diet: Doesn't drink water except with medications.  Wt Readings from Last 3 Encounters:  03/02/17 126 lb (57.2 kg)  08/19/16 120 lb (54.4 kg)    03/03/16 124 lb 12.8 oz (56.6 kg)   BP Readings from Last 3 Encounters:  03/23/17 118/62  03/02/17 130/62  12/08/16 (!) 144/62   Pulse Readings from Last 3 Encounters:  03/23/17 60  03/02/17 (!) 59  12/08/16 (!) 57    Renal function: CrCl cannot be calculated (Unknown ideal weight.).  Past Medical History:  Diagnosis Date  . Amputation of finger of right hand   . Aortic stenosis, mild 03/03/2016   By echo 08/2015  . Arthritis   . Back pain   . Bradycardia 08/28/2015  . CKD (chronic kidney disease) stage 3, GFR 30-59 ml/min   . Depression   . GERD (gastroesophageal reflux disease)   . GI bleed due to NSAIDs   . Heart murmur   . Hyperlipidemia   . Hypertension   . Macular degeneration   . Maxillary sinusitis   . MVP (mitral valve prolapse) 03/03/2016   By echo 08/2015  . Old MI (myocardial infarction)    NSTEMI secondary to stress MI/Takotsubo CM, minimal nonobstructive ASCAD at cath  . Osteoporosis   . Persistent atrial fibrillation ([place]) 2006   not on anticoagulation due to GI bleed and increased fall risk  . PMR (polymyalgia rheumatica) (HCC)   . SIADH (syndrome of inappropriate ADH production) ([place])   . Takotsubo cardiomyopathy    EF  normalized to 70%  . Ventricular tachycardia ([place])    on initial presentation of MI    Current Outpatient Prescriptions on File Prior to Visit  Medication Sig Dispense Refill  . amLODipine (NORVASC) 10 MG tablet Take 1 tablet (10 mg total) by mouth daily. 180 tablet 3  . aspirin EC 81 MG tablet Take 81 mg by mouth at bedtime.     Marland Kitchen azelastine (ASTELIN) 0.1 % nasal spray Place 2 sprays into both nostrils 2 (two) times daily. Use in each nostril as directed    . buPROPion (WELLBUTRIN XL) 150 MG 24 hr tablet Take 150 mg by mouth 2 (two) times daily.     . carvedilol (COREG) 12.5 MG tablet 12.5 mg. Take 1/2 tablet by mouth twice daily  3  . cetirizine (ZYRTEC) 10 MG tablet Take 10 mg by mouth daily.     Marland Kitchen demeclocycline (DECLOMYCIN)  150 MG tablet Take 150 mg by mouth 2 (two) times daily.     . diclofenac sodium (VOLTAREN) 1 % GEL Apply 1 application topically 4 (four) times daily as needed. Pain     . fish oil-omega-3 fatty acids 1000 MG capsule Take 1 g by mouth daily.     Marland Kitchen ibuprofen (ADVIL,MOTRIN) 200 MG tablet Take 200 mg by mouth 3 (three) times daily.    . methocarbamol (ROBAXIN) 500 MG tablet Take 500 mg by mouth as needed for muscle spasms.    . Multiple Vitamins-Minerals (CENTRUM CARDIO PO) Take 1 tablet by mouth 2 (two) times daily.     Marland Kitchen omeprazole (PRILOSEC) 20 MG capsule Take 20 mg by mouth daily.     . pregabalin (LYRICA) 50 MG capsule Take 50 mg by mouth daily.     . traMADol (ULTRAM-ER) 300 MG 24 hr tablet Take 300 mg by mouth daily as needed for pain.      No current facility-administered medications on file prior to visit.     Allergies  Allergen Reactions  . Amlodipine Swelling    To feet and ankles   . Avelox [Moxifloxacin Hcl In Nacl] Other (See Comments)    Pt does not remember   . Duragesic Disc Transdermal System [Alcohol-Fentanyl] Other (See Comments)    Reaction unknown  . Morphine And Related Other (See Comments)    "My Sister and daughter can,t take it. I've had it before without problems."  . Teriparatide (Recombinant) Other (See Comments)    Made skin dry  . Lidocaine Rash    Assessment/Plan:  1. Hypertension/Edema - BP at goal of < 150/90. Given increase in SOB and LE edema, will start furosemide 40 mg x 1 today, followed by 20 mg daily and decrease amlodipine to 5 mg daily after discussion with DOD, Dr. Curt Bears. Encouraged patient to hydrate while taking furosemide. Instructed patient to call clinic with BP > 150/90 or increase in SOB/edema. Will repeat BMet today to assess kidney function and potassium. Follow-up with Dr. Radford Pax or mid-level practitioner in 1 week, and follow-up in pharmacy HTN clinic in 2 weeks.   Patient seen by Belia Heman, PharmD, PGY1 Resident  Harrington Challenger PharmD, [place] [address] 03/23/2017 10:37 AM

## 2017-03-23 NOTE — Patient Instructions (Signed)
Please start taking Furosemide 40 mg today, then taking 20 mg daily.   Decrease amlodipine to 5 mg daily.   Please call the clinic if you have BP readings higher than 150/90 or if you have increase in shortness of breath or swelling.   Follow-up with Dr. Radford Pax in 1 week and with the pharmacy clinic in 2 weeks.

## 2017-04-05 ENCOUNTER — Ambulatory Visit: Payer: Medicare Other | Admitting: Physician Assistant

## 2017-04-07 ENCOUNTER — Encounter: Payer: Self-pay | Admitting: Cardiology

## 2017-04-07 ENCOUNTER — Ambulatory Visit (INDEPENDENT_AMBULATORY_CARE_PROVIDER_SITE_OTHER): Payer: Medicare Other | Admitting: Cardiology

## 2017-04-07 ENCOUNTER — Ambulatory Visit: Payer: Medicare Other

## 2017-04-07 VITALS — BP 138/68 | HR 54 | Ht 63.0 in | Wt 123.0 lb

## 2017-04-07 DIAGNOSIS — I10 Essential (primary) hypertension: Secondary | ICD-10-CM

## 2017-04-07 DIAGNOSIS — Z5181 Encounter for therapeutic drug level monitoring: Secondary | ICD-10-CM

## 2017-04-07 DIAGNOSIS — R0602 Shortness of breath: Secondary | ICD-10-CM

## 2017-04-07 LAB — BASIC METABOLIC PANEL
BUN/Creatinine Ratio: 23 (ref 12–28)
BUN: 27 mg/dL (ref 10–36)
CO2: 25 mmol/L (ref 20–29)
CREATININE: 1.19 mg/dL — AB (ref 0.57–1.00)
Calcium: 9.4 mg/dL (ref 8.7–10.3)
Chloride: 102 mmol/L (ref 96–106)
GFR calc Af Amer: 46 mL/min/{1.73_m2} — ABNORMAL LOW (ref >59)
GFR, EST NON AFRICAN AMERICAN: 40 mL/min/{1.73_m2} — AB (ref >59)
GLUCOSE: 94 mg/dL (ref 65–99)
POTASSIUM: 5 mmol/L (ref 3.5–5.2)
SODIUM: 140 mmol/L (ref 134–144)

## 2017-04-07 LAB — PRO B NATRIURETIC PEPTIDE: NT-Pro BNP: 212 pg/mL (ref 0–738)

## 2017-04-07 MED ORDER — FUROSEMIDE 20 MG PO TABS: 20.0000 mg | ORAL_TABLET | Freq: Every day | ORAL | 3 refills | Status: DC | PRN

## 2017-04-07 NOTE — Patient Instructions (Addendum)
Medication Instructions:  1) Take Lasix 20mg  once daily as needed.  Labwork: BMET and BNP today  Testing/Procedures: None  Follow-Up: Follow up in November with Dr. Radford Pax as planned.   Any Other Special Instructions Will Be Listed Below (If Applicable).     If you need a refill on your cardiac medications before your next appointment, please call your pharmacy.

## 2017-04-07 NOTE — Progress Notes (Signed)
04/07/2017 [name]   Oct 28, 1922  [id number]  Primary Physician Lajean Manes, MD Primary Cardiologist: Dr. Radford Pax   Reason for Visit/CC: F/u for HTN and A/c Diastolic HF  HPI: 83 y.o. female with history of stress induced MI, Takotsubo DCM with normalized EF, hypertensive heart disease and presistent atrial fibrillation. She is not on oral anticoagulation given high fall risk. She also has a h/o mild AS, and MVP with mild MR noted on echo in 2016. She is followed by Dr. Radford Pax. She was recently seen by her on 03/02/17. Basic labs were obtained including a BMP for medication monitoring. BMP was abnormal with an increase in Scr to 1.35 (13% increase) and potassium increase to 5.5 (baseline ~5). Patient was instructed to increase amlodipine to 10 mg and discontinue ramipril by Dr. Radford Pax, which the patient did on 03/09/17. She was seen in the HTN clinic for f/u on 03/23/17. She complained of increased exertional dyspnea and increased LEE. BP was controlled at 118/62. However, given her complaints of exertional dyspnea and LEE, her medications were further adjusted. She was started on Lasix, 40 mg x 1 day, followed by 20 mg daily. Amlodipine was decreased to 5 mg. F/u BMP was also obtained on 6/5. This showed improvement in SCr and K, back to normal range, at 0.97 and 4.9 respectively. She was instructed to f/u with an APP in 1 week for repeat assessment of BP and to assess response to therapy.   She notes significant improvement. Her BP is well controlled and at goal. Her exertional dyspnea has resolved. LEE is also improved. She notes a 3 lb weight loss and is now back to baseline. She ran out of her Lasix 2 days ago but has been stable w/o it.     Current Meds  Medication Sig  . amLODipine (NORVASC) 10 MG tablet Take 0.5 tablets (5 mg total) by mouth daily.  Marland Kitchen aspirin EC 81 MG tablet Take 81 mg by mouth at bedtime.   Marland Kitchen azelastine (ASTELIN) 0.1 % nasal spray Place 2 sprays into both  nostrils 2 (two) times daily. Use in each nostril as directed  . buPROPion (WELLBUTRIN XL) 150 MG 24 hr tablet Take 150 mg by mouth 2 (two) times daily.   . carvedilol (COREG) 12.5 MG tablet 12.5 mg. Take 1/2 tablet by mouth twice daily  . cetirizine (ZYRTEC) 10 MG tablet Take 10 mg by mouth daily.   Marland Kitchen demeclocycline (DECLOMYCIN) 150 MG tablet Take 150 mg by mouth 2 (two) times daily.   . diclofenac sodium (VOLTAREN) 1 % GEL Apply 1 application topically 4 (four) times daily as needed. Pain   . fish oil-omega-3 fatty acids 1000 MG capsule Take 1 g by mouth daily.   . furosemide (LASIX) 20 MG tablet Take 2 tablets by mouth today, then take 1 tablet by mouth daily starting 03/24/2017.  . ibuprofen (ADVIL,MOTRIN) 200 MG tablet Take 200 mg by mouth 3 (three) times daily.  . methocarbamol (ROBAXIN) 500 MG tablet Take 500 mg by mouth as needed for muscle spasms.  . Multiple Vitamins-Minerals (CENTRUM CARDIO PO) Take 1 tablet by mouth 2 (two) times daily.   Marland Kitchen omeprazole (PRILOSEC) 20 MG capsule Take 20 mg by mouth daily.   . pregabalin (LYRICA) 50 MG capsule Take 50 mg by mouth daily.   . traMADol (ULTRAM-ER) 300 MG 24 hr tablet Take 300 mg by mouth daily as needed for pain.    Allergies  Allergen Reactions  . Amlodipine Swelling  To feet and ankles   . Avelox [Moxifloxacin Hcl In Nacl] Other (See Comments)    Pt does not remember   . Duragesic Disc Transdermal System [Fentanyl] Other (See Comments)    Reaction unknown  . Morphine And Related Other (See Comments)    "My Sister and daughter can,t take it. I've had it before without problems."  . Teriparatide (Recombinant) Other (See Comments)    Made skin dry  . Lidocaine Rash   Past Medical History:  Diagnosis Date  . Amputation of finger of right hand   . Aortic stenosis, mild 03/03/2016   By echo 08/2015  . Arthritis   . Back pain   . Bradycardia 08/28/2015  . CKD (chronic kidney disease) stage 3, GFR 30-59 ml/min   . Depression   .  GERD (gastroesophageal reflux disease)   . GI bleed due to NSAIDs   . Heart murmur   . Hyperlipidemia   . Hypertension   . Macular degeneration   . Maxillary sinusitis   . MVP (mitral valve prolapse) 03/03/2016   By echo 08/2015  . Old MI (myocardial infarction)    NSTEMI secondary to stress MI/Takotsubo CM, minimal nonobstructive ASCAD at cath  . Osteoporosis   . Persistent atrial fibrillation ([place]) 2006   not on anticoagulation due to GI bleed and increased fall risk  . PMR (polymyalgia rheumatica) (HCC)   . SIADH (syndrome of inappropriate ADH production) ([place])   . Takotsubo cardiomyopathy    EF normalized to 70%  . Ventricular tachycardia ([place])    on initial presentation of MI   Family History  Problem Relation Age of Onset  . Cancer Mother    Past Surgical History:  Procedure Laterality Date  . CARDIAC CATHETERIZATION     normal coronary arteries  . CHOLECYSTECTOMY  11/10/2011   Procedure: LAPAROSCOPIC CHOLECYSTECTOMY WITH INTRAOPERATIVE CHOLANGIOGRAM;  Surgeon: Pedro Earls, MD;  Location: [facility];  Service: General;  Laterality: N/A;  . EYE SURGERY  06/10/11   membrane peel   . NASAL SINUS SURGERY Left   . ROTATOR CUFF REPAIR  1998  . UMBILICAL HERNIA REPAIR  11/10/2011   Procedure: HERNIA REPAIR UMBILICAL ADULT;  Surgeon: Pedro Earls, MD;  Location: [facility];  Service: General;  Laterality: N/A;  . WISDOM TOOTH EXTRACTION     Social History   Social History  . Marital status: Married    Spouse name: N/A  . Number of children: N/A  . Years of education: N/A   Occupational History  . Not on file.   Social History Main Topics  . Smoking status: Never Smoker  . Smokeless tobacco: Never Used  . Alcohol use No     Comment: occassionally  . Drug use: No  . Sexual activity: Not on file   Other Topics Concern  . Not on file   Social History Narrative  . No narrative on file     Review of Systems: General: negative for chills, fever, night sweats or  weight changes.  Cardiovascular: negative for chest pain, dyspnea on exertion, edema, orthopnea, palpitations, paroxysmal nocturnal dyspnea or shortness of breath Dermatological: negative for rash Respiratory: negative for cough or wheezing Urologic: negative for hematuria Abdominal: negative for nausea, vomiting, diarrhea, bright red blood per rectum, melena, or hematemesis Neurologic: negative for visual changes, syncope, or dizziness All other systems reviewed and are otherwise negative except as noted above.   Physical Exam:  Blood pressure 138/68, pulse (!) 54, height 5\' 3"  (1.6  m), weight 123 lb (55.8 kg).  General appearance: alert, cooperative and no distress Neck: no carotid bruit and no JVD Lungs: clear to auscultation bilaterally Heart: irregularly irregular rhythm Extremities: extremities normal, atraumatic, no cyanosis or edema Pulses: 2+ and symmetric Skin: Skin color, texture, turgor normal. No rashes or lesions Neurologic: Grossly normal  EKG not performed -- personally reviewed   ASSESSMENT AND PLAN:   1. HTN: BP is well control and at goal of <150/90 after recent medication changes. Continue amlodipine 5 mg and Coreg 12.5 mg BID. Can continue Lasix, 20 mg PRN for increased LEE/weight gain.   2. Renal Insufficiency: improved after d/c of ACE-I. SCr back to WNL at 0.97. She took lasix for several days after this. We will repeat BMP today.   3. Hypokalemia: improved after d/c of ACE-I. Repeat BMP showed improved K from 5.5>>4.9. She took lasix for several days after this. We will repeat BMP today.   4. Acute on Chronic Diastolic HF: she reports improvement in LEE and resolution of exertional dyspnea after course of Lasix. She denies any recurrent dyspnea, however she has mild crackles at the right upper lobe that does not clear with cough. No cough, fever or chills. We will check a BNP today to see if she will need any further lasix. If normal, we will plan to have her  take lasix only PRN based on LEE and weight gain of >3 lb in 24 hrs or >5 lb in 1 week, as pt reports she had issues with renal insuffiencey in the past with daily scheduled lasix.   5. H/o Stress Induced CM:  history of stress induced MI, Takotsubo DCM with normalized EF  6. Valvular Disease: mild AS, and MVP with mild MR noted on echo in 2016. No CP. Dyspnea resolved with lasix.  7. Persistent Atrial Fibrillation: rate is controlled with Coreg. Not on a/c given high fall risk   Follow-Up: keep 6 month f/u with Dr. Radford Pax in November   Julianny Milstein Ladoris Gene, MHS [facility] 04/07/2017 10:26 AM

## 2017-05-14 ENCOUNTER — Ambulatory Visit (INDEPENDENT_AMBULATORY_CARE_PROVIDER_SITE_OTHER): Payer: Medicare Other | Admitting: Cardiology

## 2017-05-14 ENCOUNTER — Encounter: Payer: Self-pay | Admitting: Cardiology

## 2017-05-14 VITALS — BP 140/64 | HR 60 | Ht 63.0 in | Wt 120.8 lb

## 2017-05-14 DIAGNOSIS — I952 Hypotension due to drugs: Secondary | ICD-10-CM | POA: Diagnosis not present

## 2017-05-14 NOTE — Progress Notes (Signed)
05/14/2017 [name]   1923-02-04  [id number]  Primary Physician Lajean Manes, MD Primary Cardiologist: Dr. Radford Pax   Reason for Visit/CC: Hypotension   HPI:  83 y.o.femalewith history of stress induced MI, Takotsubo DCM with normalized EF, hypertensive heart disease and presistent atrial fibrillation. She is not on oral anticoagulation given high fall risk. She also has a h/o mild AS, and MVP with mild MR noted on echo in 2016. She is followed by Dr. Radford Pax.  She presents to clinic today for evaluation given recent issues with hypotension at home. She resides at [facility] and had low BP last week. Systolic BP was in the mid 80s. She noted that she felt dizzy after standing up too quickly from the kitchen table. Her husband was there with her and immediately checked her BP and SBP was 85 mmHg. They called the on call RN at [facility] and she instructed her to drink water. The RN then visited her and rechecked her BP and it had stabilized. She denies any syncope, CP or dyspnea when this occurred. In retrospect, she noted that she had taken lasix earlier that day. It appears that she has been ordered to take Lasix only PRN, based on weight gain/ increased edema, given her advanced age, renal insuffiencey and prior issues with soft BP.   She has been checking her BP regularly since last week and denies any further hypotension. No symptoms. Her BP today in clinic is 140/64. Pulse rate is 60 bpm. She is w/o LEE today and notes that she has lost 6 lb since her last OV.   Current Meds  Medication Sig  . acetaminophen (TYLENOL) 500 MG tablet Take 500 mg by mouth every 6 (six) hours as needed for mild pain, fever or headache.  Marland Kitchen amLODipine (NORVASC) 5 MG tablet Take 5 mg by mouth daily.  Marland Kitchen aspirin EC 81 MG tablet Take 81 mg by mouth at bedtime.   Marland Kitchen azelastine (ASTELIN) 0.1 % nasal spray Place 2 sprays into both nostrils 2 (two) times daily. Use in each nostril as directed  .  buPROPion (WELLBUTRIN XL) 150 MG 24 hr tablet Take 150 mg by mouth 2 (two) times daily.   . Calcium Carb-Cholecalciferol (CALCIUM-VITAMIN D) 600-400 MG-UNIT TABS Take 1 tablet by mouth 2 (two) times daily.  . carvedilol (COREG) 12.5 MG tablet Take 12.5 mg by mouth 2 (two) times daily with a meal.   . cetirizine (ZYRTEC) 10 MG tablet Take 10 mg by mouth daily.   Marland Kitchen demeclocycline (DECLOMYCIN) 150 MG tablet Take 150 mg by mouth 2 (two) times daily.   . diclofenac sodium (VOLTAREN) 1 % GEL Apply 1 application topically 4 (four) times daily as needed. Pain   . fish oil-omega-3 fatty acids 1000 MG capsule Take 1 g by mouth daily.   Marland Kitchen ibuprofen (ADVIL,MOTRIN) 200 MG tablet Take 200 mg by mouth every 8 (eight) hours as needed for moderate pain.   . Multiple Vitamins-Minerals (CENTRUM CARDIO PO) Take 1 tablet by mouth 2 (two) times daily.   Marland Kitchen omeprazole (PRILOSEC) 20 MG capsule Take 20 mg by mouth daily.   . polyethylene glycol (MIRALAX / GLYCOLAX) packet Take 17 g by mouth every other day.  . pregabalin (LYRICA) 50 MG capsule Take 50 mg by mouth daily.   . traMADol (ULTRAM-ER) 300 MG 24 hr tablet Take 300 mg by mouth daily as needed for pain.   . [DISCONTINUED] furosemide (LASIX) 20 MG tablet Take 20 mg by mouth  daily as needed for fluid or edema.   Allergies  Allergen Reactions  . Amlodipine Swelling    To feet and ankles   . Avelox [Moxifloxacin Hcl In Nacl] Other (See Comments)    Pt does not remember   . Duragesic Disc Transdermal System [Fentanyl] Other (See Comments)    Reaction unknown  . Morphine And Related Other (See Comments)    "My Sister and daughter can,t take it. I've had it before without problems."  . Teriparatide (Recombinant) Other (See Comments)    Made skin dry  . Lidocaine Rash   Past Medical History:  Diagnosis Date  . Amputation of finger of right hand   . Aortic stenosis, mild 03/03/2016   By echo 08/2015  . Arthritis   . Back pain   . Bradycardia 08/28/2015  .  CKD (chronic kidney disease) stage 3, GFR 30-59 ml/min   . Depression   . GERD (gastroesophageal reflux disease)   . GI bleed due to NSAIDs   . Heart murmur   . Hyperlipidemia   . Hypertension   . Macular degeneration   . Maxillary sinusitis   . MVP (mitral valve prolapse) 03/03/2016   By echo 08/2015  . Old MI (myocardial infarction)    NSTEMI secondary to stress MI/Takotsubo CM, minimal nonobstructive ASCAD at cath  . Osteoporosis   . Persistent atrial fibrillation ([place]) 2006   not on anticoagulation due to GI bleed and increased fall risk  . PMR (polymyalgia rheumatica) (HCC)   . SIADH (syndrome of inappropriate ADH production) ([place])   . Takotsubo cardiomyopathy    EF normalized to 70%  . Ventricular tachycardia ([place])    on initial presentation of MI   Family History  Problem Relation Age of Onset  . Cancer Mother    Past Surgical History:  Procedure Laterality Date  . CARDIAC CATHETERIZATION     normal coronary arteries  . CHOLECYSTECTOMY  11/10/2011   Procedure: LAPAROSCOPIC CHOLECYSTECTOMY WITH INTRAOPERATIVE CHOLANGIOGRAM;  Surgeon: Pedro Earls, MD;  Location: [facility];  Service: General;  Laterality: N/A;  . EYE SURGERY  06/10/11   membrane peel   . NASAL SINUS SURGERY Left   . ROTATOR CUFF REPAIR  1998  . UMBILICAL HERNIA REPAIR  11/10/2011   Procedure: HERNIA REPAIR UMBILICAL ADULT;  Surgeon: Pedro Earls, MD;  Location: [facility];  Service: General;  Laterality: N/A;  . WISDOM TOOTH EXTRACTION     Social History   Social History  . Marital status: Married    Spouse name: N/A  . Number of children: N/A  . Years of education: N/A   Occupational History  . Not on file.   Social History Main Topics  . Smoking status: Never Smoker  . Smokeless tobacco: Never Used  . Alcohol use No     Comment: occassionally  . Drug use: No  . Sexual activity: Not on file   Other Topics Concern  . Not on file   Social History Narrative  . No narrative on file      Review of Systems: General: negative for chills, fever, night sweats or weight changes.  Cardiovascular: negative for chest pain, dyspnea on exertion, edema, orthopnea, palpitations, paroxysmal nocturnal dyspnea or shortness of breath Dermatological: negative for rash Respiratory: negative for cough or wheezing Urologic: negative for hematuria Abdominal: negative for nausea, vomiting, diarrhea, bright red blood per rectum, melena, or hematemesis Neurologic: negative for visual changes, syncope, or dizziness All other systems reviewed and are otherwise  negative except as noted above.   Physical Exam:  Blood pressure 140/64, pulse 60, height 5\' 3"  (1.6 m), weight 120 lb 12.8 oz (54.8 kg).  General appearance: alert, cooperative and no distress, elderly and ambulating with cane Neck: no carotid bruit and no JVD Lungs: clear to auscultation bilaterally Heart: irregularly irregular rhythm and regular rate Extremities: extremities normal, atraumatic, no cyanosis or edema Pulses: 2+ and symmetric Skin: Skin color, texture, turgor normal. No rashes or lesions Neurologic: Grossly normal  EKG not performed -- personally reviewed   ASSESSMENT AND PLAN:   1. Hypotension: resolved. Episode last week occurred after taking lasix and standing up too quickly. BP normalized and symptoms resolved after she increased PO intake and took in some fluids. She denies any recurrent low BPs at home since this occurred. She has required coreg for rate control given her persistent afib and is also on amlodipine. Her BP is stable today. Volume is also stable. Given her advanced age, fall risk , h/o renal insuffiencey and low tolerance to lasix, I have instructed her to avoid further use of lasix for now. She will continue to monitor her weight at home. If she gains > 5 lb in 1 week or notices increased dyspnea, she is to call our office for recommendations prior to taking her lasix. She is currently w/o LEE today,  however she is on amlodipine which may contribute. She was also given advised to use extreme caution with possitional changes to lower risk of orthostatic hypotension. Pt also encouraged to stay well hydrated and to take morning meds with food.   Keep f/u with Dr. Radford Pax in Nov. F/u sooner if needed.    Brittainy Ladoris Gene, MHS [facility] 05/14/2017 12:26 PM

## 2017-05-14 NOTE — Patient Instructions (Addendum)
Medication Instructions:  STOP Furosemide (Lasix)  Weigh yourself daily and call our office if your weight increases 3 lb in 1 day or 5 lb or more in 1 week  Labwork: None Ordered   Testing/Procedures: None Ordered   Follow-Up: Your physician recommends that you schedule a follow-up appointment in: 3 months with Dr. Radford Pax   If you need a refill on your cardiac medications before your next appointment, please call your pharmacy.   Thank you for choosing [facility]! Christen Bame, RN [phone]

## 2017-05-19 ENCOUNTER — Telehealth: Payer: Self-pay | Admitting: Cardiology

## 2017-05-19 ENCOUNTER — Other Ambulatory Visit: Payer: Self-pay | Admitting: Cardiology

## 2017-05-19 DIAGNOSIS — I5181 Takotsubo syndrome: Secondary | ICD-10-CM

## 2017-05-19 NOTE — Telephone Encounter (Signed)
Called pt's daughter Quita Skye back and left message informing her that the pt is no longer taking furosemide, medication was D/C at [facility] on 05/14/17, by Ellen Henri, PA, and if she has any other problems, questions or concerns to call the office.

## 2017-05-26 ENCOUNTER — Other Ambulatory Visit: Payer: Self-pay | Admitting: Cardiology

## 2017-08-11 ENCOUNTER — Ambulatory Visit: Payer: Medicare Other | Admitting: Cardiology

## 2017-08-13 ENCOUNTER — Other Ambulatory Visit: Payer: Self-pay | Admitting: Cardiology

## 2017-09-01 ENCOUNTER — Ambulatory Visit (INDEPENDENT_AMBULATORY_CARE_PROVIDER_SITE_OTHER): Payer: Medicare Other | Admitting: Cardiology

## 2017-09-01 ENCOUNTER — Encounter: Payer: Self-pay | Admitting: Cardiology

## 2017-09-01 VITALS — BP 160/70 | HR 58 | Ht 63.0 in | Wt 119.2 lb

## 2017-09-01 DIAGNOSIS — I350 Nonrheumatic aortic (valve) stenosis: Secondary | ICD-10-CM

## 2017-09-01 DIAGNOSIS — I10 Essential (primary) hypertension: Secondary | ICD-10-CM | POA: Diagnosis not present

## 2017-09-01 DIAGNOSIS — I341 Nonrheumatic mitral (valve) prolapse: Secondary | ICD-10-CM

## 2017-09-01 DIAGNOSIS — I481 Persistent atrial fibrillation: Secondary | ICD-10-CM | POA: Diagnosis not present

## 2017-09-01 DIAGNOSIS — I5181 Takotsubo syndrome: Secondary | ICD-10-CM

## 2017-09-01 DIAGNOSIS — I4819 Other persistent atrial fibrillation: Secondary | ICD-10-CM

## 2017-09-01 NOTE — Progress Notes (Signed)
Cardiology Office Note:    Date:  09/01/2017   ID:  [name], DOB Mar 15, 1923, MRN [id number]  PCP:  Lajean Manes, MD  Cardiologist:  Fransico Him, MD   Referring MD: Lajean Manes, MD   Chief Complaint  Patient presents with  . Cardiomyopathy  . Hypertension    History of Present Illness:    [name] is a 83 y.o. female with a hx of    [name] is a 83 y.o. female with history of stress induced MI, Takotsubo DCM with normalized EF, hypertensive heart disease and persistent atrial fibrillation.  He is here today for followup and is doing well.  He denies any chest pain or pressure, SOB, DOE, PND, orthopnea, LE edema, dizziness, palpitations or syncope. He is compliant with his meds and is tolerating meds with no SE.     Past Medical History:  Diagnosis Date  . Amputation of finger of right hand   . Aortic stenosis, mild 03/03/2016   By echo 08/2015  . Arthritis   . Back pain   . Bradycardia 08/28/2015  . CKD (chronic kidney disease) stage 3, GFR 30-59 ml/min (HCC)   . Depression   . GERD (gastroesophageal reflux disease)   . GI bleed due to NSAIDs   . Heart murmur   . Hyperlipidemia   . Hypertension   . Macular degeneration   . Maxillary sinusitis   . MVP (mitral valve prolapse) 03/03/2016   By echo 08/2015  . Old MI (myocardial infarction)    NSTEMI secondary to stress MI/Takotsubo CM, minimal nonobstructive ASCAD at cath  . Osteoporosis   . Persistent atrial fibrillation ([place]) 2006   not on anticoagulation due to GI bleed and increased fall risk  . PMR (polymyalgia rheumatica) (HCC)   . SIADH (syndrome of inappropriate ADH production) ([place])   . Takotsubo cardiomyopathy    EF normalized to 70%  . Ventricular tachycardia ([place])    on initial presentation of MI    Past Surgical History:  Procedure Laterality Date  . CARDIAC CATHETERIZATION     normal coronary arteries  . EYE SURGERY  06/10/11   membrane peel   .  NASAL SINUS SURGERY Left   . ROTATOR CUFF REPAIR  1998  . WISDOM TOOTH EXTRACTION      Current Medications: Current Meds  Medication Sig  . acetaminophen (TYLENOL) 500 MG tablet Take 500 mg by mouth every 6 (six) hours as needed for mild pain, fever or headache.  Marland Kitchen amLODipine (NORVASC) 5 MG tablet TAKE 1 TABLET BY MOUTH EVERY DAY  . aspirin EC 81 MG tablet Take 81 mg by mouth at bedtime.   Marland Kitchen azelastine (ASTELIN) 0.1 % nasal spray Place 2 sprays into both nostrils 2 (two) times daily. Use in each nostril as directed  . buPROPion (WELLBUTRIN XL) 150 MG 24 hr tablet Take 150 mg by mouth 2 (two) times daily.   . carvedilol (COREG) 12.5 MG tablet TAKE 1 TABLET BY MOUTH TWICE A DAY WITH A MEAL  . cetirizine (ZYRTEC) 10 MG tablet Take 10 mg by mouth daily.   Marland Kitchen demeclocycline (DECLOMYCIN) 150 MG tablet Take 150 mg by mouth 2 (two) times daily.   . diclofenac sodium (VOLTAREN) 1 % GEL Apply 1 application topically 4 (four) times daily as needed. Pain   . fish oil-omega-3 fatty acids 1000 MG capsule Take 1 g by mouth daily.   Marland Kitchen ibuprofen (ADVIL,MOTRIN) 200 MG tablet Take 200 mg by mouth every  8 (eight) hours as needed for moderate pain.   . Multiple Vitamins-Minerals (CENTRUM CARDIO PO) Take 1 tablet by mouth 2 (two) times daily.   Marland Kitchen omeprazole (PRILOSEC) 20 MG capsule Take 20 mg by mouth daily.   . polyethylene glycol (MIRALAX / GLYCOLAX) packet Take 17 g by mouth every other day.  . pregabalin (LYRICA) 50 MG capsule Take 50 mg by mouth daily.   . traMADol (ULTRAM-ER) 300 MG 24 hr tablet Take 300 mg by mouth daily as needed for pain.      Allergies:   Amlodipine; Avelox [moxifloxacin hcl in nacl]; Duragesic disc transdermal system [fentanyl]; Morphine and related; Teriparatide (recombinant); and Lidocaine   Social History   Socioeconomic History  . Marital status: Married    Spouse name: None  . Number of children: None  . Years of education: None  . Highest education level: None  Social  Needs  . Financial resource strain: None  . Food insecurity - worry: None  . Food insecurity - inability: None  . Transportation needs - medical: None  . Transportation needs - non-medical: None  Occupational History  . None  Tobacco Use  . Smoking status: Never Smoker  . Smokeless tobacco: Never Used  Substance and Sexual Activity  . Alcohol use: No    Alcohol/week: 0.6 oz    Types: 1 Glasses of wine per week    Comment: occassionally  . Drug use: No  . Sexual activity: None  Other Topics Concern  . None  Social History Narrative  . None     Family History: The patient's family history includes Cancer in her mother.  ROS:   Please see the history of present illness.    ROS  All other systems reviewed and negative.   EKGs/Labs/Other Studies Reviewed:    The following studies were reviewed today: none  EKG:  EKG is ordered today and showed SB at 58bpm with RBBB  Recent Labs: 04/07/2017: BUN 27; Creatinine, Ser 1.19; NT-Pro BNP 212; Potassium 5.0; Sodium 140   Recent Lipid Panel No results found for: CHOL, TRIG, HDL, CHOLHDL, VLDL, LDLCALC, LDLDIRECT  Physical Exam:    VS:  BP (!) 160/70   Pulse (!) 58   Ht 5\' 3"  (1.6 m)   Wt 119 lb 3.2 oz (54.1 kg)   BMI 21.12 kg/m     Wt Readings from Last 3 Encounters:  09/01/17 119 lb 3.2 oz (54.1 kg)  05/14/17 120 lb 12.8 oz (54.8 kg)  04/07/17 123 lb (55.8 kg)     GEN:  Well nourished, well developed in no acute distress HEENT: Normal NECK: No JVD; No carotid bruits LYMPHATICS: No lymphadenopathy CARDIAC: RRR, no  rubs, gallops. Midsystolic click with late peaking SM 1/6 RESPIRATORY:  Clear to auscultation without rales, wheezing or rhonchi  ABDOMEN: Soft, non-tender, non-distended MUSCULOSKELETAL:  No edema; No deformity  SKIN: Warm and dry NEUROLOGIC:  Alert and oriented x 3 PSYCHIATRIC:  Normal affect   ASSESSMENT:    1. Takotsubo cardiomyopathy   2. Essential hypertension, benign   3. Persistent  atrial fibrillation ([place])   4. Aortic stenosis, mild   5. MVP (mitral valve prolapse)    PLAN:    In order of problems listed above:  1.  Takotsubo DCM - EF has normalized on echo.  2.  HTN - BP elevated today on exam. She lost her husband on Monday and her BP is likely elevated due to stress. She will continue on carvedilol 12.5mg  BID  and amlodipine 5mg  daily.    3.  Persistent atrial fibrillation - she is maintaining NSR. She will continue on carvedilol 12.5mg  BID  4.  Mild AS by echo 2016.  5.  MVP - bileaflet with mild MR on echo 2016.     Medication Adjustments/Labs and Tests Ordered: Current medicines are reviewed at length with the patient today.  Concerns regarding medicines are outlined above.  No orders of the defined types were placed in this encounter.  No orders of the defined types were placed in this encounter.   Signed, Fransico Him, MD  09/01/2017 3:31 PM    [place]

## 2017-09-01 NOTE — Patient Instructions (Signed)

## 2017-12-23 ENCOUNTER — Other Ambulatory Visit: Payer: Self-pay | Admitting: Cardiology

## 2017-12-23 NOTE — Telephone Encounter (Signed)
REFILL 

## 2018-02-21 ENCOUNTER — Other Ambulatory Visit: Payer: Self-pay | Admitting: Cardiology

## 2018-04-12 ENCOUNTER — Ambulatory Visit: Payer: Medicare Other | Admitting: Cardiology

## 2018-04-12 ENCOUNTER — Encounter: Payer: Self-pay | Admitting: Cardiology

## 2018-04-12 VITALS — BP 124/58 | HR 57 | Ht 63.0 in | Wt 120.6 lb

## 2018-04-12 DIAGNOSIS — I350 Nonrheumatic aortic (valve) stenosis: Secondary | ICD-10-CM

## 2018-04-12 DIAGNOSIS — R600 Localized edema: Secondary | ICD-10-CM | POA: Diagnosis not present

## 2018-04-12 DIAGNOSIS — I4819 Other persistent atrial fibrillation: Secondary | ICD-10-CM

## 2018-04-12 DIAGNOSIS — I481 Persistent atrial fibrillation: Secondary | ICD-10-CM

## 2018-04-12 DIAGNOSIS — I10 Essential (primary) hypertension: Secondary | ICD-10-CM | POA: Diagnosis not present

## 2018-04-12 DIAGNOSIS — I5181 Takotsubo syndrome: Secondary | ICD-10-CM | POA: Diagnosis not present

## 2018-04-12 DIAGNOSIS — I341 Nonrheumatic mitral (valve) prolapse: Secondary | ICD-10-CM

## 2018-04-12 MED ORDER — FUROSEMIDE 20 MG PO TABS: 20.0000 mg | ORAL_TABLET | Freq: Every day | ORAL | 3 refills | Status: DC | PRN

## 2018-04-12 MED ORDER — CARVEDILOL 12.5 MG PO TABS: 12.5000 mg | ORAL_TABLET | Freq: Two times a day (BID) | ORAL | 3 refills | Status: DC

## 2018-04-12 NOTE — Progress Notes (Addendum)
Cardiology Office Note:    Date:  04/12/2018   ID:  [name], DOB 06/26/1923, MRN [id number]  PCP:  Lajean Manes, MD  Cardiologist:  No primary care provider on file.    Referring MD: Lajean Manes, MD   Chief Complaint  Patient presents with  . Cardiomyopathy  . Hypertension  . Atrial Fibrillation  . Mitral Regurgitation    History of Present Illness:    [name] is a 84 y.o. female with a hx of stress induced MI, Takotsubo DCM with normalized EF, hypertensive heart diseaseand persistent atrial fibrillation. She is here today for followup and is doing well.  She denies any chest pain or pressure, SOB, DOE, PND, orthopnea,  dizziness, palpitations or syncope.  She has had some problems recently with intermittent lower extremity edema.  Back in November she did have an episode where her leg swelled up and they were weeping.  She had that same problem about a week ago.  She is tried using compression hose with her too hard for her to get on.  She has been taking her Lasix intermittently for edema but I am not convinced that she can always tell when her legs are swollen.  Her PCP had tried to have her take her Lasix only on an as-needed basis due to chronic kidney disease.  She is compliant with her meds and is tolerating meds with no SE.    Past Medical History:  Diagnosis Date  . Amputation of finger of right hand   . Aortic stenosis, mild 03/03/2016   By echo 08/2015  . Arthritis   . Back pain   . Bradycardia 08/28/2015  . CKD (chronic kidney disease) stage 3, GFR 30-59 ml/min (HCC)   . Depression   . GERD (gastroesophageal reflux disease)   . GI bleed due to NSAIDs   . Hyperlipidemia   . Hypertension   . Macular degeneration   . Maxillary sinusitis   . MVP (mitral valve prolapse) 03/03/2016   Bileaflet MVP with mild MR by echo 2016  . Old MI (myocardial infarction)    NSTEMI secondary to stress MI/Takotsubo CM, minimal nonobstructive ASCAD at  cath  . Osteoporosis   . Persistent atrial fibrillation ([place]) 2006   not on anticoagulation due to GI bleed and increased fall risk  . PMR (polymyalgia rheumatica) (HCC)   . SIADH (syndrome of inappropriate ADH production) ([place])   . Takotsubo cardiomyopathy    EF normalized to 70%  . Ventricular tachycardia ([place])    on initial presentation of MI    Past Surgical History:  Procedure Laterality Date  . CARDIAC CATHETERIZATION     normal coronary arteries  . CHOLECYSTECTOMY  11/10/2011   Procedure: LAPAROSCOPIC CHOLECYSTECTOMY WITH INTRAOPERATIVE CHOLANGIOGRAM;  Surgeon: Pedro Earls, MD;  Location: [facility];  Service: General;  Laterality: N/A;  . EYE SURGERY  06/10/11   membrane peel   . NASAL SINUS SURGERY Left   . ROTATOR CUFF REPAIR  1998  . UMBILICAL HERNIA REPAIR  11/10/2011   Procedure: HERNIA REPAIR UMBILICAL ADULT;  Surgeon: Pedro Earls, MD;  Location: [facility];  Service: General;  Laterality: N/A;  . WISDOM TOOTH EXTRACTION      Current Medications: Current Meds  Medication Sig  . acetaminophen (TYLENOL) 500 MG tablet Take 500 mg by mouth every 6 (six) hours as needed for mild pain, fever or headache.  Marland Kitchen amLODipine (NORVASC) 2.5 MG tablet TAKE 1 TABLET BY MOUTH W/ 5MG  TAB  DAILY  . amLODipine (NORVASC) 5 MG tablet TAKE 1 TABLET BY MOUTH EVERY DAY  . aspirin EC 81 MG tablet Take 81 mg by mouth at bedtime.   Marland Kitchen azelastine (ASTELIN) 0.1 % nasal spray Place 2 sprays into both nostrils 2 (two) times daily. Use in each nostril as directed  . buPROPion (WELLBUTRIN XL) 150 MG 24 hr tablet Take 150 mg by mouth 2 (two) times daily.   . carvedilol (COREG) 12.5 MG tablet TAKE 1 TABLET BY MOUTH TWICE A DAY WITH A MEAL  . cetirizine (ZYRTEC) 10 MG tablet Take 10 mg by mouth daily.   Marland Kitchen demeclocycline (DECLOMYCIN) 150 MG tablet Take 150 mg by mouth 2 (two) times daily.   . diclofenac sodium (VOLTAREN) 1 % GEL Apply 1 application topically 4 (four) times daily as needed. Pain   . fish  oil-omega-3 fatty acids 1000 MG capsule Take 1 g by mouth daily.   Marland Kitchen ibuprofen (ADVIL,MOTRIN) 200 MG tablet Take 200 mg by mouth every 8 (eight) hours as needed for moderate pain.   . Multiple Vitamins-Minerals (CENTRUM CARDIO PO) Take 1 tablet by mouth 2 (two) times daily.   . polyethylene glycol (MIRALAX / GLYCOLAX) packet Take 17 g by mouth every other day.  . pregabalin (LYRICA) 50 MG capsule Take 50 mg by mouth daily.   . raNITIdine HCl (ZANTAC PO) Take by mouth as directed.  . traMADol (ULTRAM-ER) 300 MG 24 hr tablet Take 300 mg by mouth daily as needed for pain.      Allergies:   Amlodipine; Avelox [moxifloxacin hcl in nacl]; Duragesic disc transdermal system [fentanyl]; Morphine and related; Teriparatide (recombinant); and Lidocaine   Social History   Socioeconomic History  . Marital status: Married    Spouse name: Not on file  . Number of children: Not on file  . Years of education: Not on file  . Highest education level: Not on file  Occupational History  . Not on file  Social Needs  . Financial resource strain: Not on file  . Food insecurity:    Worry: Not on file    Inability: Not on file  . Transportation needs:    Medical: Not on file    Non-medical: Not on file  Tobacco Use  . Smoking status: Never Smoker  . Smokeless tobacco: Never Used  Substance and Sexual Activity  . Alcohol use: No    Alcohol/week: 0.6 oz    Types: 1 Glasses of wine per week    Comment: occassionally  . Drug use: No  . Sexual activity: Not on file  Lifestyle  . Physical activity:    Days per week: Not on file    Minutes per session: Not on file  . Stress: Not on file  Relationships  . Social connections:    Talks on phone: Not on file    Gets together: Not on file    Attends religious service: Not on file    Active member of club or organization: Not on file    Attends meetings of clubs or organizations: Not on file    Relationship status: Not on file  Other Topics Concern  .  Not on file  Social History Narrative  . Not on file     Family History: The patient's family history includes Cancer in her mother.  ROS:   Please see the history of present illness.    ROS  All other systems reviewed and negative.   EKGs/Labs/Other Studies Reviewed:  The following studies were reviewed today: none  EKG:  EKG is  ordered today and showed sinus bradycardia 57 bpm with first-degree AV block and right bundle branch block.  Low voltage QRS.  Recent Labs: No results found for requested labs within last 8760 hours.   Recent Lipid Panel No results found for: CHOL, TRIG, HDL, CHOLHDL, VLDL, LDLCALC, LDLDIRECT  Physical Exam:    VS:  BP (!) 124/58   Pulse (!) 57   Ht 5\' 3"  (1.6 m)   Wt 120 lb 9.6 oz (54.7 kg)   BMI 21.36 kg/m     Wt Readings from Last 3 Encounters:  04/12/18 120 lb 9.6 oz (54.7 kg)  09/01/17 119 lb 3.2 oz (54.1 kg)  05/14/17 120 lb 12.8 oz (54.8 kg)     GEN:  Well nourished, well developed in no acute distress HEENT: Normal NECK: No JVD; No carotid bruits LYMPHATICS: No lymphadenopathy CARDIAC: RRR, no murmurs, rubs, gallops RESPIRATORY:  Clear to auscultation without rales, wheezing or rhonchi  ABDOMEN: Soft, non-tender, non-distended MUSCULOSKELETAL: 1+ edema; No deformity  SKIN: Warm and dry NEUROLOGIC:  Alert and oriented x 3 PSYCHIATRIC:  Normal affect   ASSESSMENT:    1. Takotsubo cardiomyopathy   2. Essential hypertension, benign   3. Persistent atrial fibrillation ([place])   4. Aortic stenosis, mild   5. MVP (mitral valve prolapse)   6. Edema extremities    PLAN:    In order of problems listed above:  1.  H/O Takotsubo DCM -EF normalized at 60 to 65% by echo 08/2015.  She will continue on beta-blocker therapy.  2.  HTN - BP is well controlled on exam today.  She will continue on amlodipine 7.5 mg daily, carvedilol 12.5 mg twice daily.  3.  Persistent atrial fibrillation - she is maintaining normal sinus rhythm on  exam today.  She will continue on carvedilol.  She is not on chronic anti-coagulation due to increased risk of falls history of GI bleed..  4.  Mild AS -very mild by echo 2016 with mean gradient 9 mmHg.  5.  MVP -leaflet prolapse with mild MR on echo 2016.  6.  LE edema - she had some edema last week in her legs with oozing of fluid but it  resolved.  She does have some edema on exam today.  I encouraged her to take Lasix when she gets home today.  I told her daughter that someone may need to be checking her legs daily to see if she is swelling.  She will continue on Lasix PRN for edema.  I will check a bmet today.   Medication Adjustments/Labs and Tests Ordered: Current medicines are reviewed at length with the patient today.  Concerns regarding medicines are outlined above.  Orders Placed This Encounter  Procedures  . EKG 12-Lead   No orders of the defined types were placed in this encounter.   Signed, Fransico Him, MD  04/12/2018 1:19 PM    [name]

## 2018-04-12 NOTE — Patient Instructions (Signed)
Medication Instructions:  Your physician has recommended you make the following change in your medication:  START: Lasix 20 mg once a day as needed for swelling   If you need a refill on your cardiac medications, please contact your pharmacy first.  Labwork: Today for kidney function test (BMET)  Testing/Procedures: None ordered   Follow-Up: Your physician wants you to follow-up in: 6 months with Dr. Radford Pax. You will receive a reminder letter in the mail two months in advance. If you don't receive a letter, please call our office to schedule the follow-up appointment.  Any Other Special Instructions Will Be Listed Below (If Applicable).   Thank you for choosing [facility], RN  [phone]  If you need a refill on your cardiac medications before your next appointment, please call your pharmacy.

## 2018-04-13 LAB — BASIC METABOLIC PANEL
BUN/Creatinine Ratio: 17 (ref 12–28)
BUN: 20 mg/dL (ref 10–36)
CALCIUM: 9.1 mg/dL (ref 8.7–10.3)
CHLORIDE: 102 mmol/L (ref 96–106)
CO2: 24 mmol/L (ref 20–29)
Creatinine, Ser: 1.15 mg/dL — ABNORMAL HIGH (ref 0.57–1.00)
GFR calc non Af Amer: 41 mL/min/{1.73_m2} — ABNORMAL LOW (ref >59)
GFR, EST AFRICAN AMERICAN: 47 mL/min/{1.73_m2} — AB (ref >59)
GLUCOSE: 96 mg/dL (ref 65–99)
Potassium: 4.3 mmol/L (ref 3.5–5.2)
Sodium: 139 mmol/L (ref 134–144)

## 2018-04-15 ENCOUNTER — Telehealth: Payer: Self-pay | Admitting: Cardiology

## 2018-04-15 DIAGNOSIS — Z79899 Other long term (current) drug therapy: Secondary | ICD-10-CM

## 2018-04-15 MED ORDER — FUROSEMIDE 20 MG PO TABS: 20.0000 mg | ORAL_TABLET | Freq: Every day | ORAL | 3 refills | Status: DC

## 2018-04-15 NOTE — Telephone Encounter (Signed)
New Message:    Daughter called and wanted pt's lab results from  Tuesday(04-12-18)please. She wanted also to know if you had sent her lab results to Dr Carlyle Lipa office? She forgot to tell you that pt had been taken off of her 81 mg of Aspirin by her other doctor.

## 2018-04-15 NOTE — Telephone Encounter (Signed)
Notes recorded by Teressa Senter, RN on 04/15/2018 at 9:56 AM EDT Patient's daughter made aware of lab results. Instructed her to start Lasix 20 mg daily due to having LE edema and repeat BMET in 1 week. Pt scheduled for repeat bmet 04/22/18. Results forwarded to pcp and mailed to home address. She verbalized understanding and thankful for the call  Notes recorded by [name], MD on 04/13/2018 at 9:57 PM EDT Renal function stable so recommend starting Lasix 20mg  daily since she has been having LE edema and repeat BMET in 1 week   She also states that Dr. Felipa Eth d/c'd aspirin 81 mg back in May. Informed her that I would forward to Dr. Radford Pax.

## 2018-04-15 NOTE — Telephone Encounter (Signed)
Left message to call back  

## 2018-04-22 ENCOUNTER — Other Ambulatory Visit: Payer: Medicare Other | Admitting: *Deleted

## 2018-04-22 DIAGNOSIS — Z79899 Other long term (current) drug therapy: Secondary | ICD-10-CM

## 2018-04-23 LAB — BASIC METABOLIC PANEL
BUN / CREAT RATIO: 21 (ref 12–28)
BUN: 28 mg/dL (ref 10–36)
CALCIUM: 9 mg/dL (ref 8.7–10.3)
CO2: 25 mmol/L (ref 20–29)
Chloride: 101 mmol/L (ref 96–106)
Creatinine, Ser: 1.31 mg/dL — ABNORMAL HIGH (ref 0.57–1.00)
GFR calc Af Amer: 40 mL/min/{1.73_m2} — ABNORMAL LOW (ref >59)
GFR calc non Af Amer: 35 mL/min/{1.73_m2} — ABNORMAL LOW (ref >59)
GLUCOSE: 77 mg/dL (ref 65–99)
POTASSIUM: 5.2 mmol/L (ref 3.5–5.2)
SODIUM: 140 mmol/L (ref 134–144)

## 2018-04-25 ENCOUNTER — Telehealth: Payer: Self-pay

## 2018-04-25 DIAGNOSIS — Z79899 Other long term (current) drug therapy: Secondary | ICD-10-CM

## 2018-04-25 MED ORDER — FUROSEMIDE 20 MG PO TABS: 20.0000 mg | ORAL_TABLET | ORAL | 3 refills | Status: DC

## 2018-04-25 NOTE — Telephone Encounter (Signed)
Notes recorded by Teressa Senter, RN on 04/25/2018 at 1:16 PM EDT Pt's daughter Quita Skye made aware of lab results. Informed her that Dr. Radford Pax recommends decreasing Lasix to 20 mg qod due to elevated creatinine level and repeat BMET and BNP in 1 week. She stated understanding and scheduled for labs on 05/02/18. She verbalized understanding and thankful for the call   Notes recorded by [name], MD on 04/23/2018 at 5:12 PM EDT Creatinine bumped with initiation of daily Lasix - please decrease Lasix to 20mg  qod and check BMET in 1 week and BNP

## 2018-05-02 ENCOUNTER — Other Ambulatory Visit: Payer: Medicare Other | Admitting: *Deleted

## 2018-05-02 DIAGNOSIS — Z79899 Other long term (current) drug therapy: Secondary | ICD-10-CM

## 2018-05-03 ENCOUNTER — Telehealth: Payer: Self-pay

## 2018-05-03 LAB — BASIC METABOLIC PANEL
BUN/Creatinine Ratio: 18 (ref 12–28)
BUN: 26 mg/dL (ref 10–36)
CALCIUM: 9 mg/dL (ref 8.7–10.3)
CO2: 25 mmol/L (ref 20–29)
Chloride: 101 mmol/L (ref 96–106)
Creatinine, Ser: 1.41 mg/dL — ABNORMAL HIGH (ref 0.57–1.00)
GFR, EST AFRICAN AMERICAN: 37 mL/min/{1.73_m2} — AB (ref >59)
GFR, EST NON AFRICAN AMERICAN: 32 mL/min/{1.73_m2} — AB (ref >59)
Glucose: 120 mg/dL — ABNORMAL HIGH (ref 65–99)
POTASSIUM: 4.4 mmol/L (ref 3.5–5.2)
Sodium: 141 mmol/L (ref 134–144)

## 2018-05-03 LAB — PRO B NATRIURETIC PEPTIDE: NT-PRO BNP: 419 pg/mL (ref 0–738)

## 2018-05-03 MED ORDER — FUROSEMIDE 20 MG PO TABS: 20.0000 mg | ORAL_TABLET | Freq: Every day | ORAL | 0 refills | Status: DC | PRN

## 2018-05-03 NOTE — Telephone Encounter (Signed)
Called and made daughter Quita Skye [facility] on file) aware of lab results and recommendations to take lasix 20 mg PRN for edema instead of QOD. Daughter verbalized understanding and thanked me for the call. ------  Notes recorded by [name], MD on 05/03/2018 at 10:18 AM EDT Creatinine remains elevated on 20 mg Lasix every other day. Recommend taking Lasix 20 mg as needed for edema.

## 2018-06-29 ENCOUNTER — Other Ambulatory Visit: Payer: Self-pay | Admitting: Cardiology

## 2018-08-29 ENCOUNTER — Encounter: Payer: Self-pay | Admitting: Cardiology

## 2018-09-19 ENCOUNTER — Ambulatory Visit: Payer: Medicare Other | Admitting: Cardiology

## 2018-09-19 ENCOUNTER — Encounter: Payer: Self-pay | Admitting: Cardiology

## 2018-09-19 ENCOUNTER — Encounter (INDEPENDENT_AMBULATORY_CARE_PROVIDER_SITE_OTHER): Payer: Self-pay

## 2018-09-19 VITALS — BP 140/59 | HR 60 | Ht 63.0 in | Wt 120.1 lb

## 2018-09-19 DIAGNOSIS — I5181 Takotsubo syndrome: Secondary | ICD-10-CM | POA: Diagnosis not present

## 2018-09-19 DIAGNOSIS — I4819 Other persistent atrial fibrillation: Secondary | ICD-10-CM

## 2018-09-19 DIAGNOSIS — I350 Nonrheumatic aortic (valve) stenosis: Secondary | ICD-10-CM | POA: Diagnosis not present

## 2018-09-19 DIAGNOSIS — R600 Localized edema: Secondary | ICD-10-CM

## 2018-09-19 DIAGNOSIS — I10 Essential (primary) hypertension: Secondary | ICD-10-CM

## 2018-09-19 DIAGNOSIS — E7800 Pure hypercholesterolemia, unspecified: Secondary | ICD-10-CM

## 2018-09-19 DIAGNOSIS — I341 Nonrheumatic mitral (valve) prolapse: Secondary | ICD-10-CM

## 2018-09-19 DIAGNOSIS — R0602 Shortness of breath: Secondary | ICD-10-CM

## 2018-09-19 NOTE — Patient Instructions (Signed)
Medication Instructions:  Your physician recommends that you continue on your current medications as directed. Please refer to the Current Medication list given to you today.  If you need a refill on your cardiac medications before your next appointment, please call your pharmacy.   Lab work: Today: BMET, CBC, TSH and ProBNP  If you have labs (blood work) drawn today and your tests are completely normal, you will receive your results only by: Marland Kitchen MyChart Message (if you have MyChart) OR . A paper copy in the mail If you have any lab test that is abnormal or we need to change your treatment, we will call you to review the results.  Testing/Procedures: Your physician has requested that you have an echocardiogram. Echocardiography is a painless test that uses sound waves to create images of your heart. It provides your doctor with information about the size and shape of your heart and how well your heart's chambers and valves are working. This procedure takes approximately one hour. There are no restrictions for this procedure.  Follow-Up: At [facility], you and your health needs are our priority.  As part of our continuing mission to provide you with exceptional heart care, we have created designated Provider Care Teams.  These Care Teams include your primary Cardiologist (physician) and Advanced Practice Providers (APPs -  Physician Assistants and Nurse Practitioners) who all work together to provide you with the care you need, when you need it. You will need a follow up appointment in 6 months.  Please call our office 2 months in advance to schedule this appointment.  You may see Fransico Him, MD or one of the following Advanced Practice Providers on your designated Care Team:   [place], PA-C Melina Copa, PA-C . Ermalinda Barrios, PA-C

## 2018-09-19 NOTE — Progress Notes (Signed)
Cardiology Office Note:    Date:  09/19/2018   ID:  [name], DOB October 17, 1923, MRN [id number]  PCP:  Lajean Manes, MD  Cardiologist:  Fransico Him, MD    Referring MD: Lajean Manes, MD   Chief Complaint  Patient presents with  . Cardiomyopathy  . Hypertension  . Atrial Fibrillation    History of Present Illness:    [name] is a 84 y.o. female with a hx of stress induced MI (NSTEMI with minimal nonobstructive CAD at cath), Takotsubo DCM with normalized EF, hypertensive heart disease, mild aortic stenosis by echo 2016, bileaflet mitral valve prolapse with mild MR by echo 2016and persistent atrial fibrillation.  She is here today for followup and is doing well.  She denies any chest pain or pressure,  PND, orthopnea,  dizziness, palpitations or syncope.  She has chronic shortness of breath which she thinks is gotten worse recently.  She also has chronic lower extremity edema but does not have to use Lasix on a daily basis.  She is compliant with her meds and is tolerating meds with no SE.    Past Medical History:  Diagnosis Date  . Amputation of finger of right hand   . Aortic stenosis, mild 03/03/2016   By echo 08/2015  . Arthritis   . Back pain   . Bradycardia 08/28/2015  . CKD (chronic kidney disease) stage 3, GFR 30-59 ml/min (HCC)   . Depression   . GERD (gastroesophageal reflux disease)   . GI bleed due to NSAIDs   . Hyperlipidemia   . Hypertension   . Macular degeneration   . Maxillary sinusitis   . MVP (mitral valve prolapse) 03/03/2016   Bileaflet MVP with mild MR by echo 2016  . Old MI (myocardial infarction)    NSTEMI secondary to stress MI/Takotsubo CM, minimal nonobstructive ASCAD at cath  . Osteoporosis   . Persistent atrial fibrillation 2006   not on anticoagulation due to GI bleed and increased fall risk  . PMR (polymyalgia rheumatica) (HCC)   . SIADH (syndrome of inappropriate ADH production) ([place])   . Takotsubo  cardiomyopathy    EF normalized to 70%  . Ventricular tachycardia ([place])    on initial presentation of MI    Past Surgical History:  Procedure Laterality Date  . CARDIAC CATHETERIZATION     normal coronary arteries  . CHOLECYSTECTOMY  11/10/2011   Procedure: LAPAROSCOPIC CHOLECYSTECTOMY WITH INTRAOPERATIVE CHOLANGIOGRAM;  Surgeon: Pedro Earls, MD;  Location: [facility];  Service: General;  Laterality: N/A;  . EYE SURGERY  06/10/11   membrane peel   . NASAL SINUS SURGERY Left   . ROTATOR CUFF REPAIR  1998  . UMBILICAL HERNIA REPAIR  11/10/2011   Procedure: HERNIA REPAIR UMBILICAL ADULT;  Surgeon: Pedro Earls, MD;  Location: [facility];  Service: General;  Laterality: N/A;  . WISDOM TOOTH EXTRACTION      Current Medications: No outpatient medications have been marked as taking for the 09/19/18 encounter (Office Visit) with [name], MD.     Allergies:   Amlodipine; Avelox [moxifloxacin hcl in nacl]; Duragesic disc transdermal system [fentanyl]; Morphine and related; Teriparatide (recombinant); and Lidocaine   Social History   Socioeconomic History  . Marital status: Married    Spouse name: Not on file  . Number of children: Not on file  . Years of education: Not on file  . Highest education level: Not on file  Occupational History  . Not on file  Social Needs  . Financial resource strain: Not on file  . Food insecurity:    Worry: Not on file    Inability: Not on file  . Transportation needs:    Medical: Not on file    Non-medical: Not on file  Tobacco Use  . Smoking status: Never Smoker  . Smokeless tobacco: Never Used  Substance and Sexual Activity  . Alcohol use: No    Alcohol/week: 1.0 standard drinks    Types: 1 Glasses of wine per week    Comment: occassionally  . Drug use: No  . Sexual activity: Not on file  Lifestyle  . Physical activity:    Days per week: Not on file    Minutes per session: Not on file  . Stress: Not on file  Relationships  .  Social connections:    Talks on phone: Not on file    Gets together: Not on file    Attends religious service: Not on file    Active member of club or organization: Not on file    Attends meetings of clubs or organizations: Not on file    Relationship status: Not on file  Other Topics Concern  . Not on file  Social History Narrative  . Not on file     Family History: The patient's family history includes Cancer in her mother.  ROS:   Please see the history of present illness.    ROS  All other systems reviewed and negative.   EKGs/Labs/Other Studies Reviewed:    The following studies were reviewed today: none  EKG:  EKG is not ordered today.   Recent Labs: 05/02/2018: BUN 26; Creatinine, Ser 1.41; NT-Pro BNP 419; Potassium 4.4; Sodium 141   Recent Lipid Panel No results found for: CHOL, TRIG, HDL, CHOLHDL, VLDL, LDLCALC, LDLDIRECT  Physical Exam:    VS:  There were no vitals taken for this visit.    Wt Readings from Last 3 Encounters:  04/12/18 120 lb 9.6 oz (54.7 kg)  09/01/17 119 lb 3.2 oz (54.1 kg)  05/14/17 120 lb 12.8 oz (54.8 kg)     GEN:  Well nourished, well developed in no acute distress HEENT: Normal NECK: No JVD; No carotid bruits LYMPHATICS: No lymphadenopathy CARDIAC: RRR, no rubs, gallops.  2/6 systolic murmur at the right upper sternal border to the left lower sternal border.  RESPIRATORY:  Clear to auscultation without rales, wheezing or rhonchi  ABDOMEN: Soft, non-tender, non-distended MUSCULOSKELETAL:  No edema; No deformity  SKIN: Warm and dry NEUROLOGIC:  Alert and oriented x 3 PSYCHIATRIC:  Normal affect   ASSESSMENT:    1. Takotsubo cardiomyopathy   2. Essential hypertension, benign   3. Persistent atrial fibrillation ([place])   4. Aortic stenosis, mild   5. MVP (mitral valve prolapse)   6. Pure hypercholesterolemia   7. Edema of extremities    PLAN:    In order of problems listed above:  1.  Takotsubo Cardiomyopathy -it is post  remote NSTEMI with cath showing minimal nonobstructive CAD.  Dilated cardiomyopathy has resolved with EF 55 to 60% by echo in 2016.  She has been having some shortness of breath but appears euvolemic on exam.  I am going to order a be met, CBC, TSH and BNP today.  2.  Hypertension  -EP well controlled on exam today.  She will continue on amlodipine 7.5 mg daily, carvedilol 12.5 mg twice daily.  3.  Persistent atrial fibrillation -normal sinus rhythm on exam.  She  will continue on amlodipine 7.5 mg daily and carvedilol 12.5 mg twice daily.  4.  Mild AS -asymptomatic.  This was mild on echo in 2016.  Since she is having shortness of breath I am going to get a 2D echocardiogram to make sure that her left ear is not progressed since she has not had an echo in 3 years.  5.  MVP -bileaflet with mild MR by echo 2016.  7.  LE edema -this is stable and controlled on Lasix as needed.   Medication Adjustments/Labs and Tests Ordered: Current medicines are reviewed at length with the patient today.  Concerns regarding medicines are outlined above.  No orders of the defined types were placed in this encounter.  No orders of the defined types were placed in this encounter.   Signed, Fransico Him, MD  09/19/2018 2:49 PM    [place]

## 2018-09-20 LAB — BASIC METABOLIC PANEL
BUN/Creatinine Ratio: 24 (ref 12–28)
BUN: 36 mg/dL (ref 10–36)
CHLORIDE: 99 mmol/L (ref 96–106)
CO2: 23 mmol/L (ref 20–29)
CREATININE: 1.49 mg/dL — AB (ref 0.57–1.00)
Calcium: 9.8 mg/dL (ref 8.7–10.3)
GFR, EST AFRICAN AMERICAN: 35 mL/min/{1.73_m2} — AB (ref >59)
GFR, EST NON AFRICAN AMERICAN: 30 mL/min/{1.73_m2} — AB (ref >59)
Glucose: 95 mg/dL (ref 65–99)
Potassium: 5.4 mmol/L — ABNORMAL HIGH (ref 3.5–5.2)
Sodium: 137 mmol/L (ref 134–144)

## 2018-09-20 LAB — CBC
HEMATOCRIT: 34.3 % (ref 34.0–46.6)
HEMOGLOBIN: 12.2 g/dL (ref 11.1–15.9)
MCH: 31.1 pg (ref 26.6–33.0)
MCHC: 35.6 g/dL (ref 31.5–35.7)
MCV: 88 fL (ref 79–97)
Platelets: 367 10*3/uL (ref 150–450)
RBC: 3.92 x10E6/uL (ref 3.77–5.28)
RDW: 12.7 % (ref 12.3–15.4)
WBC: 7.3 10*3/uL (ref 3.4–10.8)

## 2018-09-20 LAB — TSH: TSH: 2.6 u[IU]/mL (ref 0.450–4.500)

## 2018-09-20 LAB — PRO B NATRIURETIC PEPTIDE: NT-PRO BNP: 485 pg/mL (ref 0–738)

## 2018-09-21 ENCOUNTER — Telehealth: Payer: Self-pay

## 2018-09-21 DIAGNOSIS — R7989 Other specified abnormal findings of blood chemistry: Secondary | ICD-10-CM

## 2018-09-21 MED ORDER — FUROSEMIDE 20 MG PO TABS: 20.0000 mg | ORAL_TABLET | ORAL | 3 refills | Status: DC

## 2018-09-21 NOTE — Telephone Encounter (Signed)
Spoke with the patient's daughter, she expressed understanding and accepted to a BMET on 12/11.

## 2018-09-21 NOTE — Telephone Encounter (Signed)
Notes recorded by [name], MD on 09/20/2018 at 6:10 PM EST Please decrease lasix to QOD and use compression hose during the day for LE edema. Repeat BMEt in 1 week

## 2018-09-28 ENCOUNTER — Ambulatory Visit (HOSPITAL_COMMUNITY): Payer: Medicare Other | Attending: Cardiology

## 2018-09-28 ENCOUNTER — Other Ambulatory Visit: Payer: Medicare Other | Admitting: *Deleted

## 2018-09-28 ENCOUNTER — Other Ambulatory Visit: Payer: Self-pay

## 2018-09-28 DIAGNOSIS — R0602 Shortness of breath: Secondary | ICD-10-CM

## 2018-09-28 DIAGNOSIS — R7989 Other specified abnormal findings of blood chemistry: Secondary | ICD-10-CM

## 2018-09-29 LAB — BASIC METABOLIC PANEL
BUN/Creatinine Ratio: 31 — ABNORMAL HIGH (ref 12–28)
BUN: 32 mg/dL (ref 10–36)
CHLORIDE: 104 mmol/L (ref 96–106)
CO2: 21 mmol/L (ref 20–29)
Calcium: 8.9 mg/dL (ref 8.7–10.3)
Creatinine, Ser: 1.04 mg/dL — ABNORMAL HIGH (ref 0.57–1.00)
GFR calc Af Amer: 54 mL/min/{1.73_m2} — ABNORMAL LOW (ref >59)
GFR, EST NON AFRICAN AMERICAN: 46 mL/min/{1.73_m2} — AB (ref >59)
Glucose: 99 mg/dL (ref 65–99)
POTASSIUM: 5 mmol/L (ref 3.5–5.2)
Sodium: 143 mmol/L (ref 134–144)

## 2018-12-18 DIAGNOSIS — S72001A Fracture of unspecified part of neck of right femur, initial encounter for closed fracture: Secondary | ICD-10-CM

## 2018-12-18 HISTORY — DX: Fracture of unspecified part of neck of right femur, initial encounter for closed fracture: S72.001A

## 2018-12-20 ENCOUNTER — Inpatient Hospital Stay (HOSPITAL_COMMUNITY)
Admission: EM | Admit: 2018-12-20 | Discharge: 2018-12-26 | DRG: 480 | Disposition: A | Discharge status: Skilled Nursing Facility | Payer: Medicare Other | Attending: Nephrology | Admitting: Nephrology

## 2018-12-20 DIAGNOSIS — H3530 Unspecified macular degeneration: Secondary | ICD-10-CM | POA: Diagnosis present

## 2018-12-20 DIAGNOSIS — M353 Polymyalgia rheumatica: Secondary | ICD-10-CM | POA: Diagnosis present

## 2018-12-20 DIAGNOSIS — S92534A Nondisplaced fracture of distal phalanx of right lesser toe(s), initial encounter for closed fracture: Secondary | ICD-10-CM | POA: Diagnosis present

## 2018-12-20 DIAGNOSIS — W1830XA Fall on same level, unspecified, initial encounter: Secondary | ICD-10-CM | POA: Diagnosis present

## 2018-12-20 DIAGNOSIS — I4510 Unspecified right bundle-branch block: Secondary | ICD-10-CM | POA: Diagnosis present

## 2018-12-20 DIAGNOSIS — F329 Major depressive disorder, single episode, unspecified: Secondary | ICD-10-CM | POA: Diagnosis present

## 2018-12-20 DIAGNOSIS — Z79891 Long term (current) use of opiate analgesic: Secondary | ICD-10-CM

## 2018-12-20 DIAGNOSIS — S7223XA Displaced subtrochanteric fracture of unspecified femur, initial encounter for closed fracture: Secondary | ICD-10-CM | POA: Diagnosis present

## 2018-12-20 DIAGNOSIS — I252 Old myocardial infarction: Secondary | ICD-10-CM

## 2018-12-20 DIAGNOSIS — Z6821 Body mass index (BMI) 21.0-21.9, adult: Secondary | ICD-10-CM

## 2018-12-20 DIAGNOSIS — I10 Essential (primary) hypertension: Secondary | ICD-10-CM | POA: Diagnosis present

## 2018-12-20 DIAGNOSIS — Z7982 Long term (current) use of aspirin: Secondary | ICD-10-CM

## 2018-12-20 DIAGNOSIS — E222 Syndrome of inappropriate secretion of antidiuretic hormone: Secondary | ICD-10-CM | POA: Diagnosis present

## 2018-12-20 DIAGNOSIS — E785 Hyperlipidemia, unspecified: Secondary | ICD-10-CM | POA: Diagnosis present

## 2018-12-20 DIAGNOSIS — S92333A Displaced fracture of third metatarsal bone, unspecified foot, initial encounter for closed fracture: Secondary | ICD-10-CM | POA: Diagnosis present

## 2018-12-20 DIAGNOSIS — S72141A Displaced intertrochanteric fracture of right femur, initial encounter for closed fracture: Principal | ICD-10-CM | POA: Diagnosis present

## 2018-12-20 DIAGNOSIS — Z8673 Personal history of transient ischemic attack (TIA), and cerebral infarction without residual deficits: Secondary | ICD-10-CM

## 2018-12-20 DIAGNOSIS — Z791 Long term (current) use of non-steroidal anti-inflammatories (NSAID): Secondary | ICD-10-CM

## 2018-12-20 DIAGNOSIS — W19XXXA Unspecified fall, initial encounter: Secondary | ICD-10-CM

## 2018-12-20 DIAGNOSIS — Z809 Family history of malignant neoplasm, unspecified: Secondary | ICD-10-CM

## 2018-12-20 DIAGNOSIS — I472 Ventricular tachycardia: Secondary | ICD-10-CM | POA: Diagnosis present

## 2018-12-20 DIAGNOSIS — D62 Acute posthemorrhagic anemia: Secondary | ICD-10-CM | POA: Diagnosis not present

## 2018-12-20 DIAGNOSIS — Z419 Encounter for procedure for purposes other than remedying health state, unspecified: Secondary | ICD-10-CM

## 2018-12-20 DIAGNOSIS — N183 Chronic kidney disease, stage 3 (moderate): Secondary | ICD-10-CM | POA: Diagnosis present

## 2018-12-20 DIAGNOSIS — S92323A Displaced fracture of second metatarsal bone, unspecified foot, initial encounter for closed fracture: Secondary | ICD-10-CM | POA: Diagnosis present

## 2018-12-20 DIAGNOSIS — S0003XA Contusion of scalp, initial encounter: Secondary | ICD-10-CM | POA: Diagnosis present

## 2018-12-20 DIAGNOSIS — M4312 Spondylolisthesis, cervical region: Secondary | ICD-10-CM | POA: Diagnosis present

## 2018-12-20 DIAGNOSIS — S92343A Displaced fracture of fourth metatarsal bone, unspecified foot, initial encounter for closed fracture: Secondary | ICD-10-CM | POA: Diagnosis present

## 2018-12-20 DIAGNOSIS — Z9049 Acquired absence of other specified parts of digestive tract: Secondary | ICD-10-CM

## 2018-12-20 DIAGNOSIS — Y92009 Unspecified place in unspecified non-institutional (private) residence as the place of occurrence of the external cause: Secondary | ICD-10-CM

## 2018-12-20 DIAGNOSIS — M25552 Pain in left hip: Secondary | ICD-10-CM | POA: Diagnosis present

## 2018-12-20 DIAGNOSIS — M810 Age-related osteoporosis without current pathological fracture: Secondary | ICD-10-CM | POA: Diagnosis present

## 2018-12-20 DIAGNOSIS — I129 Hypertensive chronic kidney disease with stage 1 through stage 4 chronic kidney disease, or unspecified chronic kidney disease: Secondary | ICD-10-CM | POA: Diagnosis present

## 2018-12-20 DIAGNOSIS — I341 Nonrheumatic mitral (valve) prolapse: Secondary | ICD-10-CM | POA: Diagnosis present

## 2018-12-20 DIAGNOSIS — K219 Gastro-esophageal reflux disease without esophagitis: Secondary | ICD-10-CM | POA: Diagnosis present

## 2018-12-20 DIAGNOSIS — Z66 Do not resuscitate: Secondary | ICD-10-CM | POA: Diagnosis present

## 2018-12-20 DIAGNOSIS — I5181 Takotsubo syndrome: Secondary | ICD-10-CM | POA: Diagnosis present

## 2018-12-20 DIAGNOSIS — E43 Unspecified severe protein-calorie malnutrition: Secondary | ICD-10-CM | POA: Diagnosis present

## 2018-12-20 DIAGNOSIS — S72001A Fracture of unspecified part of neck of right femur, initial encounter for closed fracture: Secondary | ICD-10-CM

## 2018-12-20 DIAGNOSIS — I4819 Other persistent atrial fibrillation: Secondary | ICD-10-CM | POA: Diagnosis present

## 2018-12-20 HISTORY — DX: Fracture of unspecified part of neck of right femur, initial encounter for closed fracture: S72.001A

## 2018-12-20 NOTE — ED Triage Notes (Signed)
Pt coming from [facility] after loosing balance and falling in kitchen. Independent living. Pt scooted herself over on floor to phone after falling to call EMS. Pt reports no LOC. Received 200 mcg of Fentanyl total. Per pt she is not on blood thinners. Swollen area on right side of pt's face. CBG 176. HR 70 and BP 163/60. Desating to 91% after EMS gave Fentanyl. Placed on 2 L [place]

## 2018-12-21 ENCOUNTER — Emergency Department (HOSPITAL_COMMUNITY): Payer: Medicare Other

## 2018-12-21 ENCOUNTER — Other Ambulatory Visit: Payer: Self-pay

## 2018-12-21 ENCOUNTER — Inpatient Hospital Stay (HOSPITAL_COMMUNITY): Payer: Medicare Other | Admitting: Certified Registered"

## 2018-12-21 ENCOUNTER — Inpatient Hospital Stay (HOSPITAL_COMMUNITY): Payer: Medicare Other

## 2018-12-21 ENCOUNTER — Encounter (HOSPITAL_COMMUNITY): Payer: Self-pay | Admitting: Internal Medicine

## 2018-12-21 ENCOUNTER — Encounter (HOSPITAL_COMMUNITY)
Admission: EM | Disposition: A | Discharge status: Skilled Nursing Facility | Payer: Self-pay | Source: Home / Self Care | Attending: Family Medicine

## 2018-12-21 DIAGNOSIS — M810 Age-related osteoporosis without current pathological fracture: Secondary | ICD-10-CM | POA: Diagnosis present

## 2018-12-21 DIAGNOSIS — E785 Hyperlipidemia, unspecified: Secondary | ICD-10-CM | POA: Diagnosis present

## 2018-12-21 DIAGNOSIS — N183 Chronic kidney disease, stage 3 (moderate): Secondary | ICD-10-CM | POA: Diagnosis present

## 2018-12-21 DIAGNOSIS — E43 Unspecified severe protein-calorie malnutrition: Secondary | ICD-10-CM | POA: Diagnosis present

## 2018-12-21 DIAGNOSIS — I4819 Other persistent atrial fibrillation: Secondary | ICD-10-CM

## 2018-12-21 DIAGNOSIS — S72001A Fracture of unspecified part of neck of right femur, initial encounter for closed fracture: Secondary | ICD-10-CM

## 2018-12-21 DIAGNOSIS — I10 Essential (primary) hypertension: Secondary | ICD-10-CM | POA: Diagnosis not present

## 2018-12-21 DIAGNOSIS — Z6821 Body mass index (BMI) 21.0-21.9, adult: Secondary | ICD-10-CM | POA: Diagnosis not present

## 2018-12-21 DIAGNOSIS — M353 Polymyalgia rheumatica: Secondary | ICD-10-CM | POA: Diagnosis present

## 2018-12-21 DIAGNOSIS — I472 Ventricular tachycardia: Secondary | ICD-10-CM | POA: Diagnosis present

## 2018-12-21 DIAGNOSIS — I5181 Takotsubo syndrome: Secondary | ICD-10-CM | POA: Diagnosis present

## 2018-12-21 DIAGNOSIS — S7223XA Displaced subtrochanteric fracture of unspecified femur, initial encounter for closed fracture: Secondary | ICD-10-CM | POA: Diagnosis present

## 2018-12-21 DIAGNOSIS — H3530 Unspecified macular degeneration: Secondary | ICD-10-CM | POA: Diagnosis present

## 2018-12-21 DIAGNOSIS — S72141A Displaced intertrochanteric fracture of right femur, initial encounter for closed fracture: Secondary | ICD-10-CM | POA: Diagnosis present

## 2018-12-21 DIAGNOSIS — E222 Syndrome of inappropriate secretion of antidiuretic hormone: Secondary | ICD-10-CM

## 2018-12-21 DIAGNOSIS — S92534A Nondisplaced fracture of distal phalanx of right lesser toe(s), initial encounter for closed fracture: Secondary | ICD-10-CM | POA: Diagnosis not present

## 2018-12-21 DIAGNOSIS — S72001D Fracture of unspecified part of neck of right femur, subsequent encounter for closed fracture with routine healing: Secondary | ICD-10-CM | POA: Diagnosis not present

## 2018-12-21 DIAGNOSIS — Z66 Do not resuscitate: Secondary | ICD-10-CM | POA: Diagnosis present

## 2018-12-21 DIAGNOSIS — Y92009 Unspecified place in unspecified non-institutional (private) residence as the place of occurrence of the external cause: Secondary | ICD-10-CM | POA: Diagnosis not present

## 2018-12-21 DIAGNOSIS — S0003XA Contusion of scalp, initial encounter: Secondary | ICD-10-CM | POA: Diagnosis present

## 2018-12-21 DIAGNOSIS — S92333A Displaced fracture of third metatarsal bone, unspecified foot, initial encounter for closed fracture: Secondary | ICD-10-CM | POA: Diagnosis present

## 2018-12-21 DIAGNOSIS — I4510 Unspecified right bundle-branch block: Secondary | ICD-10-CM | POA: Diagnosis present

## 2018-12-21 DIAGNOSIS — I129 Hypertensive chronic kidney disease with stage 1 through stage 4 chronic kidney disease, or unspecified chronic kidney disease: Secondary | ICD-10-CM | POA: Diagnosis present

## 2018-12-21 DIAGNOSIS — F329 Major depressive disorder, single episode, unspecified: Secondary | ICD-10-CM | POA: Diagnosis present

## 2018-12-21 DIAGNOSIS — D62 Acute posthemorrhagic anemia: Secondary | ICD-10-CM | POA: Diagnosis not present

## 2018-12-21 DIAGNOSIS — S92343A Displaced fracture of fourth metatarsal bone, unspecified foot, initial encounter for closed fracture: Secondary | ICD-10-CM | POA: Diagnosis present

## 2018-12-21 DIAGNOSIS — S92323A Displaced fracture of second metatarsal bone, unspecified foot, initial encounter for closed fracture: Secondary | ICD-10-CM | POA: Diagnosis present

## 2018-12-21 DIAGNOSIS — I252 Old myocardial infarction: Secondary | ICD-10-CM | POA: Diagnosis not present

## 2018-12-21 DIAGNOSIS — W1830XA Fall on same level, unspecified, initial encounter: Secondary | ICD-10-CM | POA: Diagnosis present

## 2018-12-21 HISTORY — PX: INTRAMEDULLARY (IM) NAIL INTERTROCHANTERIC: SHX5875

## 2018-12-21 LAB — URINALYSIS, ROUTINE W REFLEX MICROSCOPIC
BILIRUBIN URINE: NEGATIVE
Glucose, UA: NEGATIVE mg/dL
Hgb urine dipstick: NEGATIVE
Ketones, ur: NEGATIVE mg/dL
Leukocytes,Ua: NEGATIVE
Nitrite: NEGATIVE
Protein, ur: NEGATIVE mg/dL
Specific Gravity, Urine: 1.021 (ref 1.005–1.030)
pH: 5 (ref 5.0–8.0)

## 2018-12-21 LAB — CBC WITH DIFFERENTIAL/PLATELET
Abs Immature Granulocytes: 0.05 10*3/uL (ref 0.00–0.07)
Basophils Absolute: 0.1 10*3/uL (ref 0.0–0.1)
Basophils Relative: 1 %
EOS ABS: 0.3 10*3/uL (ref 0.0–0.5)
Eosinophils Relative: 3 %
HCT: 35.8 % — ABNORMAL LOW (ref 36.0–46.0)
Hemoglobin: 11.3 g/dL — ABNORMAL LOW (ref 12.0–15.0)
Immature Granulocytes: 1 %
Lymphocytes Relative: 15 %
Lymphs Abs: 1.6 10*3/uL (ref 0.7–4.0)
MCH: 29.4 pg (ref 26.0–34.0)
MCHC: 31.6 g/dL (ref 30.0–36.0)
MCV: 93.2 fL (ref 80.0–100.0)
Monocytes Absolute: 1 10*3/uL (ref 0.1–1.0)
Monocytes Relative: 9 %
Neutro Abs: 7.5 10*3/uL (ref 1.7–7.7)
Neutrophils Relative %: 71 %
Platelets: 319 10*3/uL (ref 150–400)
RBC: 3.84 MIL/uL — AB (ref 3.87–5.11)
RDW: 13.4 % (ref 11.5–15.5)
WBC: 10.4 10*3/uL (ref 4.0–10.5)
nRBC: 0 % (ref 0.0–0.2)

## 2018-12-21 LAB — BASIC METABOLIC PANEL
Anion gap: 7 (ref 5–15)
BUN: 35 mg/dL — ABNORMAL HIGH (ref 8–23)
CO2: 23 mmol/L (ref 22–32)
CREATININE: 1.21 mg/dL — AB (ref 0.44–1.00)
Calcium: 8.9 mg/dL (ref 8.9–10.3)
Chloride: 106 mmol/L (ref 98–111)
GFR calc Af Amer: 45 mL/min — ABNORMAL LOW (ref >60)
GFR calc non Af Amer: 39 mL/min — ABNORMAL LOW (ref >60)
Glucose, Bld: 140 mg/dL — ABNORMAL HIGH (ref 70–99)
Potassium: 5 mmol/L (ref 3.5–5.1)
SODIUM: 136 mmol/L (ref 135–145)

## 2018-12-21 LAB — MRSA PCR SCREENING: MRSA by PCR: NEGATIVE

## 2018-12-21 SURGERY — FIXATION, FRACTURE, INTERTROCHANTERIC, WITH INTRAMEDULLARY ROD
Anesthesia: Spinal | Laterality: Right

## 2018-12-21 MED ORDER — ACETAMINOPHEN 325 MG PO TABS
325.0000 mg | ORAL_TABLET | Freq: Four times a day (QID) | ORAL | Status: DC | PRN
  Administered 2018-12-23 – 2018-12-25 (×6): 650 mg via ORAL
  Administered 2018-12-25: 325 mg via ORAL
  Filled 2018-12-21: qty 1
  Filled 2018-12-21 (×6): qty 2

## 2018-12-21 MED ORDER — PHENYLEPHRINE HCL 10 MG/ML IJ SOLN
INTRAMUSCULAR | Status: DC | PRN
  Administered 2018-12-21: 80 ug via INTRAVENOUS

## 2018-12-21 MED ORDER — ROCURONIUM BROMIDE 50 MG/5ML IV SOSY
PREFILLED_SYRINGE | INTRAVENOUS | Status: AC
  Filled 2018-12-21: qty 5

## 2018-12-21 MED ORDER — DEXMEDETOMIDINE HCL IN NACL 200 MCG/50ML IV SOLN
INTRAVENOUS | Status: AC
  Filled 2018-12-21: qty 50

## 2018-12-21 MED ORDER — CEFAZOLIN SODIUM-DEXTROSE 2-3 GM-%(50ML) IV SOLR
INTRAVENOUS | Status: DC | PRN
  Administered 2018-12-21: 2 g via INTRAVENOUS

## 2018-12-21 MED ORDER — ONDANSETRON HCL 4 MG/2ML IJ SOLN
INTRAMUSCULAR | Status: DC | PRN
  Administered 2018-12-21: 4 mg via INTRAVENOUS

## 2018-12-21 MED ORDER — HYDROCODONE-ACETAMINOPHEN 5-325 MG PO TABS
1.0000 | ORAL_TABLET | ORAL | Status: DC | PRN
  Administered 2018-12-22: 2 via ORAL
  Administered 2018-12-24 – 2018-12-26 (×6): 1 via ORAL
  Filled 2018-12-21 (×4): qty 1
  Filled 2018-12-21: qty 2
  Filled 2018-12-21 (×2): qty 1

## 2018-12-21 MED ORDER — PROPOFOL 1000 MG/100ML IV EMUL
INTRAVENOUS | Status: AC
  Filled 2018-12-21: qty 100

## 2018-12-21 MED ORDER — METOCLOPRAMIDE HCL 5 MG PO TABS: 5.0000 mg | ORAL_TABLET | Freq: Three times a day (TID) | ORAL | Status: DC | PRN

## 2018-12-21 MED ORDER — HYDROCODONE-ACETAMINOPHEN 7.5-325 MG PO TABS
1.0000 | ORAL_TABLET | ORAL | Status: DC | PRN
  Administered 2018-12-22 – 2018-12-23 (×2): 2 via ORAL
  Administered 2018-12-23: 1 via ORAL
  Filled 2018-12-21: qty 2
  Filled 2018-12-21 (×2): qty 1

## 2018-12-21 MED ORDER — HYDROMORPHONE HCL 1 MG/ML IJ SOLN
INTRAMUSCULAR | Status: AC
  Administered 2018-12-21: 0.5 mg via INTRAVENOUS
  Filled 2018-12-21: qty 1

## 2018-12-21 MED ORDER — PROPOFOL 10 MG/ML IV BOLUS
INTRAVENOUS | Status: AC
  Filled 2018-12-21: qty 20

## 2018-12-21 MED ORDER — CEFAZOLIN SODIUM-DEXTROSE 2-4 GM/100ML-% IV SOLN
2.0000 g | INTRAVENOUS | Status: DC
  Filled 2018-12-21: qty 100

## 2018-12-21 MED ORDER — FENTANYL CITRATE (PF) 100 MCG/2ML IJ SOLN
50.0000 ug | Freq: Once | INTRAMUSCULAR | Status: AC
  Administered 2018-12-21: 50 ug via INTRAVENOUS
  Filled 2018-12-21: qty 2

## 2018-12-21 MED ORDER — HYDROMORPHONE HCL 1 MG/ML IJ SOLN
0.2500 mg | INTRAMUSCULAR | Status: DC | PRN
  Administered 2018-12-21 (×2): 0.5 mg via INTRAVENOUS

## 2018-12-21 MED ORDER — ADULT MULTIVITAMIN W/MINERALS CH
1.0000 | ORAL_TABLET | Freq: Every day | ORAL | Status: DC
  Administered 2018-12-22 – 2018-12-26 (×5): 1 via ORAL
  Filled 2018-12-21 (×5): qty 1

## 2018-12-21 MED ORDER — MORPHINE SULFATE (PF) 2 MG/ML IV SOLN
0.5000 mg | INTRAVENOUS | Status: DC | PRN
  Administered 2018-12-25: 0.5 mg via INTRAVENOUS
  Administered 2018-12-26 (×2): 1 mg via INTRAVENOUS
  Filled 2018-12-21 (×3): qty 1

## 2018-12-21 MED ORDER — CHLORHEXIDINE GLUCONATE 4 % EX LIQD
60.0000 mL | Freq: Once | CUTANEOUS | Status: DC
  Filled 2018-12-21: qty 60

## 2018-12-21 MED ORDER — METOCLOPRAMIDE HCL 5 MG/ML IJ SOLN: 5.0000 mg | Freq: Three times a day (TID) | INTRAMUSCULAR | Status: DC | PRN

## 2018-12-21 MED ORDER — LIDOCAINE 2% (20 MG/ML) 5 ML SYRINGE
INTRAMUSCULAR | Status: AC
  Filled 2018-12-21: qty 5

## 2018-12-21 MED ORDER — ONDANSETRON HCL 4 MG/2ML IJ SOLN
INTRAMUSCULAR | Status: AC
  Filled 2018-12-21: qty 2

## 2018-12-21 MED ORDER — AMLODIPINE BESYLATE 5 MG PO TABS
7.5000 mg | ORAL_TABLET | Freq: Every day | ORAL | Status: DC
  Filled 2018-12-21: qty 1

## 2018-12-21 MED ORDER — DEMECLOCYCLINE HCL 150 MG PO TABS
150.0000 mg | ORAL_TABLET | Freq: Two times a day (BID) | ORAL | Status: DC
  Administered 2018-12-21 – 2018-12-26 (×10): 150 mg via ORAL
  Filled 2018-12-21 (×11): qty 1

## 2018-12-21 MED ORDER — DOCUSATE SODIUM 100 MG PO CAPS
100.0000 mg | ORAL_CAPSULE | Freq: Two times a day (BID) | ORAL | Status: DC
  Administered 2018-12-21 – 2018-12-26 (×10): 100 mg via ORAL
  Filled 2018-12-21 (×9): qty 1

## 2018-12-21 MED ORDER — ONDANSETRON HCL 4 MG PO TABS: 4.0000 mg | ORAL_TABLET | Freq: Four times a day (QID) | ORAL | Status: DC | PRN

## 2018-12-21 MED ORDER — FENTANYL CITRATE (PF) 100 MCG/2ML IJ SOLN
25.0000 ug | INTRAMUSCULAR | Status: DC | PRN
  Administered 2018-12-21 – 2018-12-22 (×3): 25 ug via INTRAVENOUS
  Filled 2018-12-21 (×4): qty 2

## 2018-12-21 MED ORDER — BUPIVACAINE IN DEXTROSE 0.75-8.25 % IT SOLN
INTRATHECAL | Status: DC | PRN
  Administered 2018-12-21: 1.6 mL via INTRATHECAL

## 2018-12-21 MED ORDER — 0.9 % SODIUM CHLORIDE (POUR BTL) OPTIME
TOPICAL | Status: DC | PRN
  Administered 2018-12-21: 1000 mL

## 2018-12-21 MED ORDER — LACTATED RINGERS IV SOLN
INTRAVENOUS | Status: DC | PRN
  Administered 2018-12-21: 15:00:00 via INTRAVENOUS

## 2018-12-21 MED ORDER — ONDANSETRON HCL 4 MG/2ML IJ SOLN: 4.0000 mg | Freq: Once | INTRAMUSCULAR | Status: DC | PRN

## 2018-12-21 MED ORDER — CARVEDILOL 12.5 MG PO TABS
12.5000 mg | ORAL_TABLET | Freq: Two times a day (BID) | ORAL | Status: DC
  Administered 2018-12-21 – 2018-12-25 (×9): 12.5 mg via ORAL
  Filled 2018-12-21 (×10): qty 1

## 2018-12-21 MED ORDER — ACETAMINOPHEN 500 MG PO TABS
500.0000 mg | ORAL_TABLET | Freq: Four times a day (QID) | ORAL | Status: AC
  Administered 2018-12-21 – 2018-12-22 (×2): 500 mg via ORAL
  Filled 2018-12-21 (×2): qty 1

## 2018-12-21 MED ORDER — ONDANSETRON HCL 4 MG/2ML IJ SOLN: 4.0000 mg | Freq: Four times a day (QID) | INTRAMUSCULAR | Status: DC | PRN

## 2018-12-21 MED ORDER — SODIUM CHLORIDE 0.9 % IV SOLN
INTRAVENOUS | Status: DC | PRN
  Administered 2018-12-21: 25 ug/min via INTRAVENOUS

## 2018-12-21 MED ORDER — TRAMADOL HCL 50 MG PO TABS
50.0000 mg | ORAL_TABLET | Freq: Four times a day (QID) | ORAL | Status: DC | PRN
  Administered 2018-12-23 – 2018-12-25 (×8): 50 mg via ORAL
  Filled 2018-12-21 (×8): qty 1

## 2018-12-21 MED ORDER — POVIDONE-IODINE 10 % EX SWAB: 2.0000 "application " | Freq: Once | CUTANEOUS | Status: DC

## 2018-12-21 MED ORDER — PROPOFOL 500 MG/50ML IV EMUL
INTRAVENOUS | Status: DC | PRN
  Administered 2018-12-21: 25 ug/kg/min via INTRAVENOUS

## 2018-12-21 MED ORDER — EPHEDRINE SULFATE 50 MG/ML IJ SOLN
INTRAMUSCULAR | Status: DC | PRN
  Administered 2018-12-21: 15 mg via INTRAVENOUS

## 2018-12-21 MED ORDER — ENSURE ENLIVE PO LIQD
237.0000 mL | Freq: Two times a day (BID) | ORAL | Status: DC
  Administered 2018-12-21 – 2018-12-22 (×3): 237 mL via ORAL
  Administered 2018-12-23: 120 mL via ORAL
  Administered 2018-12-24 (×2): 237 mL via ORAL
  Administered 2018-12-25: 120 mL via ORAL
  Administered 2018-12-25 – 2018-12-26 (×2): 237 mL via ORAL

## 2018-12-21 SURGICAL SUPPLY — 40 items
ALCOHOL 70% 16 OZ (MISCELLANEOUS) ×3 IMPLANT
BIT DRILL FLUTED FEMUR 4.2/3 (BIT) ×2 IMPLANT
BNDG COHESIVE 6X5 TAN STRL LF (GAUZE/BANDAGES/DRESSINGS) ×6 IMPLANT
CANISTER SUCTION WELLS/JOHNSON (MISCELLANEOUS) ×3 IMPLANT
COVER PERINEAL POST (MISCELLANEOUS) ×3 IMPLANT
COVER SURGICAL LIGHT HANDLE (MISCELLANEOUS) ×3 IMPLANT
COVER WAND RF STERILE (DRAPES) ×3 IMPLANT
DRAPE HALF SHEET 40X57 (DRAPES) IMPLANT
DRAPE INCISE IOBAN 66X45 STRL (DRAPES) ×3 IMPLANT
DRAPE STERI IOBAN 125X83 (DRAPES) ×3 IMPLANT
DRSG ADAPTIC 3X8 NADH LF (GAUZE/BANDAGES/DRESSINGS) ×3 IMPLANT
DRSG EMULSION OIL 3X3 NADH (GAUZE/BANDAGES/DRESSINGS) ×2 IMPLANT
DURAPREP 26ML APPLICATOR (WOUND CARE) ×3 IMPLANT
ELECT CAUTERY BLADE 6.4 (BLADE) ×3 IMPLANT
ELECT REM PT RETURN 9FT ADLT (ELECTROSURGICAL) ×3
ELECTRODE REM PT RTRN 9FT ADLT (ELECTROSURGICAL) ×1 IMPLANT
GAUZE SPONGE 4X4 12PLY STRL (GAUZE/BANDAGES/DRESSINGS) ×2 IMPLANT
GAUZE SPONGE 4X4 12PLY STRL LF (GAUZE/BANDAGES/DRESSINGS) ×3 IMPLANT
GLOVE BIO SURGEON STRL SZ7.5 (GLOVE) ×3 IMPLANT
GLOVE BIOGEL PI IND STRL 8 (GLOVE) ×1 IMPLANT
GLOVE BIOGEL PI INDICATOR 8 (GLOVE) ×2
GOWN STRL REUS W/ TWL LRG LVL3 (GOWN DISPOSABLE) ×1 IMPLANT
GOWN STRL REUS W/ TWL XL LVL3 (GOWN DISPOSABLE) ×1 IMPLANT
GOWN STRL REUS W/TWL LRG LVL3 (GOWN DISPOSABLE) ×3
GOWN STRL REUS W/TWL XL LVL3 (GOWN DISPOSABLE) ×3
GUIDEWIRE 3.2X400 (WIRE) ×2 IMPLANT
KIT BASIN OR (CUSTOM PROCEDURE TRAY) ×3 IMPLANT
KIT TURNOVER KIT B (KITS) ×3 IMPLANT
NAIL TROCH FIX 10X235 RT 130 (Nail) ×2 IMPLANT
NS IRRIG 1000ML POUR BTL (IV SOLUTION) ×3 IMPLANT
PACK GENERAL/GYN (CUSTOM PROCEDURE TRAY) ×3 IMPLANT
PAD ARMBOARD 7.5X6 YLW CONV (MISCELLANEOUS) ×6 IMPLANT
SCREW LOCK T25 FT 36X5X4.3X (Screw) IMPLANT
SCREW LOCKING 5.0X36MM (Screw) ×3 IMPLANT
SCREW TFNA 90MM HIP (Orthopedic Implant) ×2 IMPLANT
STAPLER VISISTAT 35W (STAPLE) ×3 IMPLANT
SUT MON AB 2-0 CT1 36 (SUTURE) ×3 IMPLANT
TOWEL OR 17X24 6PK STRL BLUE (TOWEL DISPOSABLE) ×3 IMPLANT
TOWEL OR 17X26 10 PK STRL BLUE (TOWEL DISPOSABLE) ×3 IMPLANT
WATER STERILE IRR 1000ML POUR (IV SOLUTION) ×3 IMPLANT

## 2018-12-21 NOTE — ED Notes (Signed)
Pt reports R hip pain and head pain. EMS to give another 50 mcg upon arrival.

## 2018-12-21 NOTE — Anesthesia Preprocedure Evaluation (Addendum)
Anesthesia Evaluation  Patient identified by MRN, date of birth, ID band Patient awake    Reviewed: Allergy & Precautions, NPO status , Patient's Chart, lab work & pertinent test results  Airway Mallampati: III  TM Distance: >3 FB Neck ROM: Full    Dental  (+) Poor Dentition, Chipped, Dental Advisory Given   Pulmonary neg pulmonary ROS,    Pulmonary exam normal breath sounds clear to auscultation       Cardiovascular hypertension, Pt. on medications and Pt. on home beta blockers + Past MI  Normal cardiovascular exam+ dysrhythmias Atrial Fibrillation + Valvular Problems/Murmurs MVP and AS  Rhythm:Irregular Rate:Normal + Systolic murmurs Takotsubo CM Hx/o MI AS- mild  Echo 09/28/2018- Left ventricle: The cavity size was normal. There was mild focal   basal hypertrophy of the septum. Systolic function was normal.   The estimated ejection fraction was in the range of 55% to 60%.   Wall motion was normal; there were no regional wall motion   abnormalities. Doppler parameters are consistent with abnormal   left ventricular relaxation (grade 1 diastolic dysfunction). - Aortic valve: Trileaflet; moderately thickened, moderately   calcified leaflets. There was very mild stenosis. - Mitral valve: Moderately thickened leaflets . Trivial, late   systolicprolapse, involving the anterior leaflet and the   posterior leaflet. There was trivial regurgitation. - Left atrium: The atrium was mildly dilated. - Atrial septum: No defect or patent foramen ovale was identified. - Tricuspid valve: There was mild regurgitation. - Pulmonary arteries: PA peak pressure: 33 mm Hg (S).   Neuro/Psych PSYCHIATRIC DISORDERS Depression negative neurological ROS     GI/Hepatic GERD  Medicated and Controlled,  Endo/Other  Hyperlipidemia  Renal/GU Renal InsufficiencyRenal disease  negative genitourinary   Musculoskeletal  (+) Arthritis ,  Osteoarthritis,  Polymyalgia rheumatica   Abdominal   Peds  Hematology  (+) anemia ,   Anesthesia Other Findings   Reproductive/Obstetrics                           Anesthesia Physical Anesthesia Plan  ASA: III  Anesthesia Plan: Spinal   Post-op Pain Management:    Induction:   PONV Risk Score and Plan: 3 and Ondansetron and Treatment may vary due to age or medical condition  Airway Management Planned: Natural Airway, Nasal Cannula and Simple Face Mask  Additional Equipment:   Intra-op Plan:   Post-operative Plan:   Informed Consent: I have reviewed the patients History and Physical, chart, labs and discussed the procedure including the risks, benefits and alternatives for the proposed anesthesia with the patient or authorized representative who has indicated his/her understanding and acceptance.   Patient has DNR.  Discussed DNR with patient and Suspend DNR.   Dental advisory given  Plan Discussed with: CRNA and Surgeon  Anesthesia Plan Comments:        Anesthesia Quick Evaluation

## 2018-12-21 NOTE — Transfer of Care (Signed)
Immediate Anesthesia Transfer of Care Note  Patient: [name]  Procedure(s) Performed: INTRAMEDULLARY (IM) NAIL INTERTROCHANTRIC (Right )  Patient Location: PACU  Anesthesia Type:MAC and Spinal  Level of Consciousness: awake, alert , oriented and patient cooperative  Airway & Oxygen Therapy: Patient Spontanous Breathing  Post-op Assessment: Report given to RN and Post -op Vital signs reviewed and stable  Post vital signs: Reviewed and stable  Last Vitals:  Vitals Value Taken Time  BP 89/33 12/21/2018  4:33 PM  Temp    Pulse 64 12/21/2018  4:34 PM  Resp 14 12/21/2018  4:34 PM  SpO2 98 % 12/21/2018  4:34 PM  Vitals shown include unvalidated device data.  Last Pain:  Vitals:   12/21/18 1102  TempSrc:   PainSc: Asleep      Patients Stated Pain Goal: 0 (24/58/09 9833)  Complications: No apparent anesthesia complications

## 2018-12-21 NOTE — Discharge Instructions (Signed)
Orthopedic discharge instructions: -Okay for full weightbearing as tolerated to the right lower extremity with no precautions. -Apply ice to the right hip incision for 20 to 30 minutes at a time throughout the day.  Otherwise use Tylenol and tramadol for pain as needed. -Maintain postoperative bandage until your follow-up appointment.  You may shower with this in place but do not submerge underwater. -Return to see Dr. Stann Mainland in 2 weeks for routine postoperative follow-up. -For the prevention of blood clots take an 81 mg aspirin twice daily for 6 weeks.

## 2018-12-21 NOTE — Progress Notes (Signed)
[place] OF CARE NOTE Patient: [name] HID:[id number]   PCP: Lajean Manes, MD DOB: April 10, 1923   DOA: 12/20/2018   DOS: 12/21/2018    Patient was admitted by my colleague Dr. Hal Hope earlier on 12/21/2018. I have reviewed the H&P as well as assessment and plan and agree with the same. Important changes in the plan are listed below.  Plan of care: Principal Problem:   Closed right hip fracture (HCC) Active Problems:   Essential hypertension, benign   Takotsubo cardiomyopathy   SIADH (syndrome of inappropriate ADH production) (HCC)   Persistent atrial fibrillation (HCC)   MVP (mitral valve prolapse)   Closed right hip fracture, initial encounter (HCC)   Protein-calorie malnutrition, severe pt in the surgery. Will monitor course.   Author: Berle Mull, MD [facility] 12/21/2018 5:07 PM   If 7PM-7AM, please contact night-coverage at [email or web address]

## 2018-12-21 NOTE — Progress Notes (Signed)
I have discussed this patient via the telephone with Dr. Tyrone Nine in the ER.  Our plan will be for operative fixation of the right hip fracture, Later today December 21, 2018.  Please keep her nothing by mouth beginning now.  Full consult note to follow later today.

## 2018-12-21 NOTE — Op Note (Signed)
Date of Surgery: 12/21/2018  INDICATIONS: Ms. Lowdermilk is a 85 y.o.-year-old female who sustained a right hip fracture. The risks and benefits of the procedure discussed with the patient prior to the procedure and all questions were answered; consent was obtained.  PREOPERATIVE DIAGNOSIS: right hip fracture   POSTOPERATIVE DIAGNOSIS: Same   PROCEDURE: Treatment of intertrochanteric, pertrochanteric, subtrochanteric fracture with intramedullary implant. CPT [phone]   SURGEON: Dannielle Karvonen. Stann Mainland, M.D.   ANESTHESIA: general   IV FLUIDS AND URINE: See anesthesia record   ESTIMATED BLOOD LOSS: 50 cc  IMPLANTS:   Synthes TFNA 10 * 235 mm 95 mm compression screw proximal 36x 5.0 mm distal screw  DRAINS: None.   COMPLICATIONS: None.   DESCRIPTION OF PROCEDURE: The patient was brought to the operating room and placed supine on the operating table. The patient's leg had been signed prior to the procedure. The patient had the anesthesia placed by the anesthesiologist. The prep verification and incision time-outs were performed to confirm that this was the correct patient, site, side and location. The patient had an SCD on the opposite lower extremity. The patient did receive antibiotics prior to the incision and was re-dosed during the procedure as needed at indicated intervals. The patient was positioned on the fracture table with the table in traction and internal rotation to reduce the hip. The well leg was placed in a scissor position and all bony prominences were well-padded. The patient had the lower extremity prepped and draped in the standard surgical fashion. The incision was made 4 finger breadths superior to the greater trochanter. A guide pin was inserted into the tip of the greater trochanter under fluoroscopic guidance. An opening reamer was used to gain access to the femoral canal. The nail length was measured and inserted down the femoral canal to its proper depth. The appropriate  version of insertion for the lag screw was found under fluoroscopy. A pin was inserted up the femoral neck through the jig. The length of the lag screw was then measured. The lag screw was inserted as near to center-center in the head as possible. The leg was taken out of traction, then the compression screw was used to compress across the fracture. Compression was visualized on serial xrays.   We next turned our attention to the distal interlocking screw.  This was placed through the drill guide of the nail inserter.  A small incision was made overlying the lateral thigh at the screw site, and a tonsil was used to disect down to bone.  A drill pass was made through the jig and across the nail through both cortices.  This was measured, and the appropriate screw was placed under hand power and found to have good bite.    The wound was copiously irrigated with saline and the subcutaneous layer closed with 2.0 vicryl and the skin was reapproximated with staples. The wounds were cleaned and dried a final time and a sterile dressing was placed. The hip was taken through a range of motion at the end of the case under fluoroscopic imaging to visualize the approach-withdraw phenomenon and confirm implant length in the head. The patient was then awakened from anesthesia and taken to the recovery room in stable condition. All counts were correct at the end of the case.   POSTOPERATIVE PLAN: The patient will be weight bearing as tolerated and will return in 2 weeks for staple removal and the patient will receive DVT prophylaxis based on other medications, activity level,  and risk ratio of bleeding to thrombosis.  She will be on bid 81 mg asa for 6 weeks for DVT PPx.    Geralynn Rile, MD [facility] [phone] 4:19 PM

## 2018-12-21 NOTE — ED Provider Notes (Signed)
[facility] EMERGENCY DEPARTMENT Provider Note   CSN: [id number] Arrival date & time: 12/20/18  2353    History   Chief Complaint Chief Complaint  Patient presents with  . Fall    HPI [name] is a 85 y.o. female.     85 yo F with a chief complaint of a fall.  Per EMS the patient was able to scoot on the ground to the phone and was able to call 911.  Complaining mostly of posterior right hip pain as well as has a obvious bump to her head.  I am unable to see who obtain a history from the patient because she got 250 mcg of fentanyl in route.  Level 5 caveat altered mental status.  The history is provided by the patient.  Fall  Pertinent negatives include no chest pain, no headaches and no shortness of breath.  Illness  Severity:  Mild Onset quality:  Sudden Duration:  2 days Timing:  Constant Progression:  Worsening Chronicity:  New Associated symptoms: no chest pain, no congestion, no fever, no headaches, no myalgias, no nausea, no rhinorrhea, no shortness of breath, no vomiting and no wheezing     Past Medical History:  Diagnosis Date  . Amputation of finger of right hand   . Aortic stenosis, mild 03/03/2016   By echo 08/2015  . Arthritis   . Back pain   . Bradycardia 08/28/2015  . CKD (chronic kidney disease) stage 3, GFR 30-59 ml/min (HCC)   . Depression   . GERD (gastroesophageal reflux disease)   . GI bleed due to NSAIDs   . Hyperlipidemia   . Hypertension   . Macular degeneration   . Maxillary sinusitis   . MVP (mitral valve prolapse) 03/03/2016   Bileaflet MVP with mild MR by echo 2016  . Old MI (myocardial infarction)    NSTEMI secondary to stress MI/Takotsubo CM, minimal nonobstructive ASCAD at cath  . Osteoporosis   . Persistent atrial fibrillation 2006   not on anticoagulation due to GI bleed and increased fall risk  . PMR (polymyalgia rheumatica) (HCC)   . SIADH (syndrome of inappropriate ADH production) ([place])   .  Takotsubo cardiomyopathy    EF normalized to 70%  . Ventricular tachycardia ([place])    on initial presentation of MI    Patient Active Problem List   Diagnosis Date Noted  . Edema of extremities 04/12/2018  . Aortic stenosis, mild 03/03/2016  . MVP (mitral valve prolapse) 03/03/2016  . Bradycardia 08/28/2015  . Takotsubo cardiomyopathy   . Old MI (myocardial infarction)   . Back pain   . SIADH (syndrome of inappropriate ADH production) ([place])   . Ventricular tachycardia ([place])   . GI bleed due to NSAIDs   . Hyperlipidemia   . Persistent atrial fibrillation ([place])   . Umbilical hernia 28/78/6767  . S/P cholecystectomy 12/03/2011  . Polymyalgia rheumatica ([place]) 10/08/2011  . Essential hypertension, benign 10/08/2011  . Esophageal reflux 10/08/2011    Past Surgical History:  Procedure Laterality Date  . CARDIAC CATHETERIZATION     normal coronary arteries  . CHOLECYSTECTOMY  11/10/2011   Procedure: LAPAROSCOPIC CHOLECYSTECTOMY WITH INTRAOPERATIVE CHOLANGIOGRAM;  Surgeon: Pedro Earls, MD;  Location: [facility];  Service: General;  Laterality: N/A;  . EYE SURGERY  06/10/11   membrane peel   . NASAL SINUS SURGERY Left   . ROTATOR CUFF REPAIR  1998  . UMBILICAL HERNIA REPAIR  11/10/2011   Procedure: HERNIA REPAIR UMBILICAL  ADULT;  Surgeon: Pedro Earls, MD;  Location: [facility];  Service: General;  Laterality: N/A;  . WISDOM TOOTH EXTRACTION       OB History   No obstetric history on file.      Home Medications    Prior to Admission medications   Medication Sig Start Date End Date Taking? Authorizing Provider  acetaminophen (TYLENOL) 500 MG tablet Take 500 mg by mouth every 6 (six) hours as needed for mild pain, fever or headache.    [provider]  amLODipine (NORVASC) 2.5 MG tablet TAKE 1 TABLET BY MOUTH W/ 5MG  TAB DAILY 02/21/18   Turner, Eber Hong, MD  amLODipine (NORVASC) 5 MG tablet TAKE 1 TABLET BY MOUTH EVERY DAY 06/29/18   [name], MD  aspirin EC 81 MG  tablet Take 81 mg by mouth at bedtime.     [provider]  azelastine (ASTELIN) 0.1 % nasal spray Place 2 sprays into both nostrils 2 (two) times daily. Use in each nostril as directed    [provider]  buPROPion (WELLBUTRIN SR) 150 MG 12 hr tablet Take 150 mg by mouth every morning.    [provider]  carvedilol (COREG) 12.5 MG tablet Take 1 tablet (12.5 mg total) by mouth 2 (two) times daily with a meal. 04/12/18   Turner, Eber Hong, MD  cetirizine (ZYRTEC) 10 MG tablet Take 10 mg by mouth daily.     [provider]  demeclocycline (DECLOMYCIN) 150 MG tablet Take 150 mg by mouth 2 (two) times daily.  08/17/11   [provider]  diclofenac sodium (VOLTAREN) 1 % GEL Apply 1 application topically 4 (four) times daily as needed. Pain     [provider]  fish oil-omega-3 fatty acids 1000 MG capsule Take 1 g by mouth daily.     [provider]  furosemide (LASIX) 20 MG tablet Take 1 tablet (20 mg total) by mouth every other day. 09/21/18 09/21/19  [name], MD  ibuprofen (ADVIL,MOTRIN) 200 MG tablet Take 200 mg by mouth every 8 (eight) hours as needed for moderate pain.     [provider]  Multiple Vitamins-Minerals (CENTRUM CARDIO PO) Take 1 tablet by mouth 2 (two) times daily.     [provider]  polyethylene glycol (MIRALAX / GLYCOLAX) packet Take 17 g by mouth every other day.    [provider]  pregabalin (LYRICA) 50 MG capsule Take 50 mg by mouth daily.     [provider]  raNITIdine HCl (ZANTAC PO) Take by mouth as directed.    [provider]  traMADol (ULTRAM-ER) 300 MG 24 hr tablet Take 300 mg by mouth daily as needed for pain.  09/28/11   [provider]    Family History Family History  Problem Relation Age of Onset  . Cancer Mother     Social History Social History   Tobacco Use  . Smoking status: Never Smoker  . Smokeless tobacco: Never Used    Substance Use Topics  . Alcohol use: No    Alcohol/week: 1.0 standard drinks    Types: 1 Glasses of wine per week    Comment: occassionally  . Drug use: No     Allergies   Amlodipine; Avelox [moxifloxacin hcl in nacl]; Duragesic disc transdermal system [fentanyl]; Morphine and related; Teriparatide (recombinant); and Lidocaine   Review of Systems Review of Systems  Unable to perform ROS: Mental status change  Constitutional: Negative for chills and  fever.  HENT: Negative for congestion and rhinorrhea.   Eyes: Negative for redness and visual disturbance.  Respiratory: Negative for shortness of breath and wheezing.   Cardiovascular: Negative for chest pain and palpitations.  Gastrointestinal: Negative for nausea and vomiting.  Genitourinary: Negative for dysuria and urgency.  Musculoskeletal: Negative for arthralgias and myalgias.  Skin: Negative for pallor and wound.  Neurological: Negative for dizziness and headaches.     Physical Exam Updated Vital Signs BP (!) 155/53   Pulse 69   Temp 97.7 F (36.5 C) (Oral)   Resp 15   SpO2 100%   Physical Exam Vitals signs and nursing note reviewed.  Constitutional:      General: She is not in acute distress.    Appearance: She is well-developed. She is not diaphoretic.  HENT:     Head: Normocephalic and atraumatic.     Comments: Hematoma to the right frontal region.  No noted midline spinal tenderness Eyes:     Pupils: Pupils are equal, round, and reactive to light.  Neck:     Musculoskeletal: Normal range of motion and neck supple.  Cardiovascular:     Rate and Rhythm: Normal rate and regular rhythm.     Heart sounds: No murmur. No friction rub. No gallop.   Pulmonary:     Effort: Pulmonary effort is normal.     Breath sounds: No wheezing or rales.  Abdominal:     General: There is no distension.     Palpations: Abdomen is soft.     Tenderness: There is no abdominal tenderness.  Musculoskeletal:        General: No  tenderness.     Comments: I am unable to elicit pain on my palpation of the patient from head to toe.  Skin:    General: Skin is warm and dry.  Neurological:     Mental Status: She is alert.     Comments: Confused, slow response  Psychiatric:        Behavior: Behavior normal.      ED Treatments / Results  Labs (all labs ordered are listed, but only abnormal results are displayed) Labs Reviewed  CBC WITH DIFFERENTIAL/PLATELET - Abnormal; Notable for the following components:      Result Value   RBC 3.84 (*)    Hemoglobin 11.3 (*)    HCT 35.8 (*)    All other components within normal limits  BASIC METABOLIC PANEL - Abnormal; Notable for the following components:   Glucose, Bld 140 (*)    BUN 35 (*)    Creatinine, Ser 1.21 (*)    GFR calc non Af Amer 39 (*)    GFR calc Af Amer 45 (*)    All other components within normal limits  URINALYSIS, ROUTINE W REFLEX MICROSCOPIC    EKG EKG Interpretation  Date/Time:  Wednesday December 21 2018 00:14:18 EST Ventricular Rate:  67 PR Interval:    QRS Duration: 180 QT Interval:  461 QTC Calculation: 487 R Axis:   -99 Text Interpretation:  Sinus rhythm Borderline prolonged PR interval Probable left atrial enlargement Right bundle branch block with QRS widening Otherwise no significant change Confirmed by Deno Etienne [phone]) on 12/21/2018 12:44:13 AM   Radiology Dg Chest 1 View  Result Date: 12/21/2018 CLINICAL DATA:  Leg pain EXAM: CHEST  1 VIEW COMPARISON:  07/20/2014 FINDINGS: No acute consolidation or effusion. Mild cardiomegaly with aortic atherosclerosis. No pneumothorax. IMPRESSION: No active disease.  Mild cardiomegaly Electronically Signed   By: Maudie Mercury  Francoise Ceo M.D.   On: 12/21/2018 01:25   Dg Lumbar Spine Complete  Result Date: 12/21/2018 CLINICAL DATA:  Leg pain EXAM: LUMBAR SPINE - COMPLETE 4+ VIEW COMPARISON:  MRI 01/02/2007 FINDINGS: Dense aortic atherosclerosis. Study limited by scoliosis and osteopenia. Multiple treated  compression fractures at L3, T12 and L1 with additional treated compression deformities at the lower thoracic spine. Moderate severe compression fracture T11, chronic. Advanced degenerative change at L4-L5 and L5-S1. Moderate severe degenerative change L3-L4. IMPRESSION: 1. Limited study secondary to scoliosis and osteopenia. 2. Multiple treated compression fractures of the thoracolumbar spine with multiple level degenerative changes, most marked at L3 through S1. No gross acute osseous abnormality. Electronically Signed   By: Donavan Foil M.D.   On: 12/21/2018 01:30   Ct Head Wo Contrast  Result Date: 12/21/2018 CLINICAL DATA:  Fall with head trauma EXAM: CT HEAD WITHOUT CONTRAST CT CERVICAL SPINE WITHOUT CONTRAST TECHNIQUE: Multidetector CT imaging of the head and cervical spine was performed following the standard protocol without intravenous contrast. Multiplanar CT image reconstructions of the cervical spine were also generated. COMPARISON:  Head CT 11/14/2005 FINDINGS: CT HEAD FINDINGS Brain: There is no mass, hemorrhage or extra-axial collection. There is generalized atrophy without lobar predilection. Areas of hypoattenuation of the deep gray nuclei and confluent periventricular white matter hypodensity, consistent with chronic small vessel disease. Old left basal ganglia lacunar infarct. Vascular: Atherosclerotic calcification of the internal carotid arteries at the skull base. No abnormal hyperdensity of the major intracranial arteries or dural venous sinuses. Skull: Right frontoparietal scalp hematoma.  No skull fracture. Sinuses/Orbits: No fluid levels or advanced mucosal thickening of the visualized paranasal sinuses. No mastoid or middle ear effusion. The orbits are normal. CT CERVICAL SPINE FINDINGS Alignment: Grade 1 anterolisthesis at C3-4 and C4-5, likely secondary to facet arthrosis. Skull base and vertebrae: No acute fracture. Soft tissues and spinal canal: No prevertebral fluid or swelling.  No visible canal hematoma. Disc levels: There is severe overgrowth at the right atlantoaxial joint with a large anterior osteophyte. Hypertrophy and calcific fragments surround the odontoid process. There is multilevel severe facet hypertrophy. No bony spinal canal stenosis. Severe left C3-4, left C4-5, right C5-6 neural foraminal stenosis. Upper chest: No pneumothorax, pulmonary nodule or pleural effusion. Other: Normal visualized paraspinal cervical soft tissues. IMPRESSION: 1. No acute intracranial abnormality. Findings of chronic small vessel disease and generalized atrophy. 2. Right frontoparietal scalp hematoma without skull fracture. 3. No acute fracture of the cervical spine. 4. Severe chronic atlantoaxial degenerative change. 5. Multilevel severe facet arthrosis with associated foraminal stenosis. Electronically Signed   By: Ulyses Jarred M.D.   On: 12/21/2018 01:19   Ct Cervical Spine Wo Contrast  Result Date: 12/21/2018 CLINICAL DATA:  Fall with head trauma EXAM: CT HEAD WITHOUT CONTRAST CT CERVICAL SPINE WITHOUT CONTRAST TECHNIQUE: Multidetector CT imaging of the head and cervical spine was performed following the standard protocol without intravenous contrast. Multiplanar CT image reconstructions of the cervical spine were also generated. COMPARISON:  Head CT 11/14/2005 FINDINGS: CT HEAD FINDINGS Brain: There is no mass, hemorrhage or extra-axial collection. There is generalized atrophy without lobar predilection. Areas of hypoattenuation of the deep gray nuclei and confluent periventricular white matter hypodensity, consistent with chronic small vessel disease. Old left basal ganglia lacunar infarct. Vascular: Atherosclerotic calcification of the internal carotid arteries at the skull base. No abnormal hyperdensity of the major intracranial arteries or dural venous sinuses. Skull: Right frontoparietal scalp hematoma.  No skull fracture. Sinuses/Orbits: No fluid levels or  advanced mucosal thickening  of the visualized paranasal sinuses. No mastoid or middle ear effusion. The orbits are normal. CT CERVICAL SPINE FINDINGS Alignment: Grade 1 anterolisthesis at C3-4 and C4-5, likely secondary to facet arthrosis. Skull base and vertebrae: No acute fracture. Soft tissues and spinal canal: No prevertebral fluid or swelling. No visible canal hematoma. Disc levels: There is severe overgrowth at the right atlantoaxial joint with a large anterior osteophyte. Hypertrophy and calcific fragments surround the odontoid process. There is multilevel severe facet hypertrophy. No bony spinal canal stenosis. Severe left C3-4, left C4-5, right C5-6 neural foraminal stenosis. Upper chest: No pneumothorax, pulmonary nodule or pleural effusion. Other: Normal visualized paraspinal cervical soft tissues. IMPRESSION: 1. No acute intracranial abnormality. Findings of chronic small vessel disease and generalized atrophy. 2. Right frontoparietal scalp hematoma without skull fracture. 3. No acute fracture of the cervical spine. 4. Severe chronic atlantoaxial degenerative change. 5. Multilevel severe facet arthrosis with associated foraminal stenosis. Electronically Signed   By: Ulyses Jarred M.D.   On: 12/21/2018 01:19   Dg Hip Unilat W Or Wo Pelvis 2-3 Views Right  Result Date: 12/21/2018 CLINICAL DATA:  Leg pain EXAM: DG HIP (WITH OR WITHOUT PELVIS) 2-3V RIGHT COMPARISON:  None. FINDINGS: SI joints are non widened. Pubic symphysis appears intact. Probable chronic fracture deformity of the left inferior pubic ramus. Acute comminuted displaced right intertrochanteric fracture. Right femoral head projects in joint. The shaft of the femur is migrated superiorly. There are vascular calcifications. IMPRESSION: 1. Acute comminuted and displaced right intertrochanteric fracture 2. Probable chronic fracture of the left inferior pubic ramus Electronically Signed   By: Donavan Foil M.D.   On: 12/21/2018 01:26    Procedures Procedures (including  critical care time)  Medications Ordered in ED Medications - No data to display   Initial Impression / Assessment and Plan / ED Course  I have reviewed the triage vital signs and the nursing notes.  Pertinent labs & imaging results that were available during my care of the patient were reviewed by me and considered in my medical decision making (see chart for details).        85 yo F with a chief complaint of fall.  History is obtained exclusively from EMS because of the amount of pain medicine that she arrived prior to arrival which left her confused and unable to answer questions.  Per the history she is complaining mostly of pain to the right hip, will obtain a plain film of the right hip chest x-ray CT the head and C-spine.  Basic lab work.  Reassess the patient when she is a bit more cognizant.  Patient reassessed and is now able to carry on a conversation with me, states that she was walking into the kitchen and turned too quickly and lost her balance and fell to the ground.  Planing of pain to the posterior aspect of the right hip.  She struck her head but does not think that hurts her that much.  Denies numbness or tingling to the leg.  Denies back pain denies neck pain.  Plain film viewed by me with intertrochanteric right hip fracture.  I discussed the case with Dr. Stann Mainland, recommended making the patient n.p.o. and he would evaluate her this morning for potentially procedure later today.  The patients results and plan were reviewed and discussed.   Any x-rays performed were independently reviewed by myself.   Differential diagnosis were considered with the presenting HPI.  Medications - No data to  display  Vitals:   12/21/18 0015 12/21/18 0045 12/21/18 0100 12/21/18 0145  BP: (!) 150/50 (!) 149/69 (!) 144/49 (!) 155/53  Pulse: 65 (!) 120 66 69  Resp: 14 16 17 15   Temp: 97.7 F (36.5 C)     TempSrc: Oral     SpO2: 97% 100% 98% 100%    Final diagnoses:  Closed  intertrochanteric fracture of hip, right, initial encounter [facility])    Admission/ observation were discussed with the admitting physician, patient and/or family and they are comfortable with the plan.    Final Clinical Impressions(s) / ED Diagnoses   Final diagnoses:  Closed intertrochanteric fracture of hip, right, initial encounter [facility])    ED Discharge Orders    None       Deno Etienne, DO 12/21/18 0151

## 2018-12-21 NOTE — Clinical Social Work Note (Signed)
Clinical Social Work Assessment  Patient Details  Name: [name] MRN: [id number] Date of Birth: 1923-09-18  Date of referral:  12/21/18               Reason for consult:  Facility Placement                Permission sought to share information with:  Family Supports Permission granted to share information::  Yes, Verbal Permission Granted  Name::     Theme park manager::  family  Relationship::  son  Contact Information:  [facility] [phone]  Housing/Transportation Living arrangements for the past 2 months:  Independent Living Facility([facility]. ) Source of Information:  Patient, Adult Children Patient Interpreter Needed:  None Criminal Activity/Legal Involvement Pertinent to Current Situation/Hospitalization:  No - Comment as needed Significant Relationships:  Adult Children, Other Family Members Lives with:  Self, Facility Resident Do you feel safe going back to the place where you live?  Yes Need for family participation in patient care:  Yes (Comment)  Care giving concerns:  CSW consulted as pt has fallen and broken her hip. CSW made aware that pt may be in need of SNF placement once surgery has taken place. CSW met with pt and son at bedside to discuss this further.    Social Worker assessment / plan:  CSW spoke with pt and son [name]) at bedside. CSW was advised that pt is from [facility]. CSW was informed that pt lives in the ILF portion of the facility. Pt reports that she has been at the facility for almost 4 years. Per son pt has trouble seeing therefore either himself, sisters or nieces take pt food daily. CSW addressed concerns with pt and son regarding how long they have been in the ED. CSW advised them that ED is very busy and as soon as bed becomes available pt should be moving. This appeared to calm both pt and son a little however both still very anxious to get upstairs to a room.   CSW discussed with pt plans for after surgery. CSW was  informed that pt is interested in SNF rather it be at [facility] or another facility. Son verbalized that he went through the same situation with his father and mother in the past and mother who is pt went to [facility] for rehab.   During this assessment pt was lying in bed in pain while son stood beside CSW speaking. RN came into room to give pt medication for pain. Once this was complete pt expressed no further desire to speak. CSW understanding and expressed that CSW has gathered all information and would let pt rest.   Employment status:  Retired Nurse, adult PT Recommendations:  Not assessed at this time Information / Referral to community resources:  [place]  Patient/Family's Response to care:  Pt and son appeared to be understanding and agreeable to plan of care at this time. Son and pt both expressed understanding why pt hasn't eaten and why pt cant eat.   Patient/Family's Understanding of and Emotional Response to Diagnosis, Current Treatment, and Prognosis:  At this time no further questions or concerns have been presented to CSW.   Emotional Assessment Appearance:  Appears stated age Attitude/Demeanor/Rapport:  Other(pt appeared to be in pain. ) Affect (typically observed):  Appropriate Orientation:  Oriented to Self, Oriented to Place, Oriented to  Time, Oriented to Situation Alcohol / Substance use:  Not Applicable  Psych involvement (Current and /or in the community):  No (Comment)  Discharge Needs  Concerns to be addressed:  Care Coordination, Discharge Planning Concerns Readmission within the last 30 days:  No Current discharge risk:  Dependent with Mobility, Lives alone Barriers to Discharge:  Continued Medical Work up, Other(pt supposed to have surgery. )   Wetzel Bjornstad, [place] 12/21/2018, 8:11 AM

## 2018-12-21 NOTE — Anesthesia Postprocedure Evaluation (Signed)
Anesthesia Post Note  Patient: [name]  Procedure(s) Performed: INTRAMEDULLARY (IM) NAIL INTERTROCHANTRIC (Right )     Patient location during evaluation: PACU Anesthesia Type: Spinal Level of consciousness: oriented and awake Pain management: pain level controlled Vital Signs Assessment: post-procedure vital signs reviewed and stable Respiratory status: spontaneous breathing, respiratory function stable and patient connected to nasal cannula oxygen Cardiovascular status: blood pressure returned to baseline and stable Postop Assessment: no headache, no backache, no apparent nausea or vomiting and spinal receding Anesthetic complications: no    Last Vitals:  Vitals:   12/21/18 1755 12/21/18 1805  BP: (!) 119/50 (!) 113/50  Pulse: 70 67  Resp: 14 13  Temp: (!) 36.2 C   SpO2: 90% 100%    Last Pain:  Vitals:   12/21/18 1755  TempSrc:   PainSc: Asleep                 Vonzella Althaus P Prinston Kynard

## 2018-12-21 NOTE — ED Notes (Signed)
ED TO INPATIENT HANDOFF REPORT  ED Nurse Name and Phone #: [phone]  S Name/Age/Gender [name] 85 y.o. female Room/Bed: 025C/025C  Code Status   Code Status: DNR  Home/SNF/Other [place] Patient oriented to: self, place, time and situation Is this baseline? Yes   Triage Complete: Triage complete  Chief Complaint Fall & Right hip fracture  Triage Note Pt coming from [facility] after loosing balance and falling in kitchen. Independent living. Pt scooted herself over on floor to phone after falling to call EMS. Pt reports no LOC. Received 200 mcg of Fentanyl total. Per pt she is not on blood thinners. Swollen area on right side of pt's face. CBG 176. HR 70 and BP 163/60. Desating to 91% after EMS gave Fentanyl. Placed on 2 L [place]   Allergies Allergies  Allergen Reactions  . Amlodipine Swelling    To feet and ankles   . Avelox [Moxifloxacin Hcl In Nacl] Other (See Comments)    Pt does not remember   . Duragesic Disc Transdermal System [Fentanyl] Other (See Comments)    Reaction unknown  . Morphine And Related Other (See Comments)    "My Sister and daughter can,t take it. I've had it before without problems."  . Teriparatide (Recombinant) Other (See Comments)    Made skin dry  . Lidocaine Rash    Level of Care/Admitting Diagnosis ED Disposition    ED Disposition Condition [facility] Area: [place] [100100]  Level of Care: Medical Telemetry [104]  Diagnosis: Closed right hip fracture, initial encounter [facility]) [[id number]]  Admitting Physician: Rise Patience [phone]  Attending Physician: Rise Patience [phone]  Estimated length of stay: past midnight tomorrow  Certification:: I certify this patient will need inpatient services for at least 2 midnights  PT Class (Do Not Modify): Inpatient [101]  PT Acc Code (Do Not Modify): Private [1]       B Medical/Surgery History Past Medical  History:  Diagnosis Date  . Amputation of finger of right hand   . Aortic stenosis, mild 03/03/2016   By echo 08/2015  . Arthritis   . Back pain   . Bradycardia 08/28/2015  . CKD (chronic kidney disease) stage 3, GFR 30-59 ml/min (HCC)   . Depression   . GERD (gastroesophageal reflux disease)   . GI bleed due to NSAIDs   . Hyperlipidemia   . Hypertension   . Macular degeneration   . Maxillary sinusitis   . MVP (mitral valve prolapse) 03/03/2016   Bileaflet MVP with mild MR by echo 2016  . Old MI (myocardial infarction)    NSTEMI secondary to stress MI/Takotsubo CM, minimal nonobstructive ASCAD at cath  . Osteoporosis   . Persistent atrial fibrillation 2006   not on anticoagulation due to GI bleed and increased fall risk  . PMR (polymyalgia rheumatica) (HCC)   . SIADH (syndrome of inappropriate ADH production) ([place])   . Takotsubo cardiomyopathy    EF normalized to 70%  . Ventricular tachycardia ([place])    on initial presentation of MI   Past Surgical History:  Procedure Laterality Date  . CARDIAC CATHETERIZATION     normal coronary arteries  . CHOLECYSTECTOMY  11/10/2011   Procedure: LAPAROSCOPIC CHOLECYSTECTOMY WITH INTRAOPERATIVE CHOLANGIOGRAM;  Surgeon: Pedro Earls, MD;  Location: [facility]S;  Service: General;  Laterality: N/A;  . EYE SURGERY  06/10/11   membrane peel   . NASAL SINUS SURGERY Left   .  ROTATOR CUFF REPAIR  1998  . UMBILICAL HERNIA REPAIR  11/10/2011   Procedure: HERNIA REPAIR UMBILICAL ADULT;  Surgeon: Pedro Earls, MD;  Location: [facility]S;  Service: General;  Laterality: N/A;  . WISDOM TOOTH EXTRACTION       A IV Location/Drains/Wounds Patient Lines/Drains/Airways Status   Active Line/Drains/Airways    Name:   Placement date:   Placement time:   Site:   Days:   Peripheral IV 12/21/18 Anterior;Left Forearm   12/21/18    -    Forearm   less than 1          Intake/Output Last 24 hours No intake or output data in the 24 hours ending 12/21/18  1028  Labs/Imaging Results for orders placed or performed during the hospital encounter of 12/20/18 (from the past 48 hour(s))  CBC with Differential     Status: Abnormal   Collection Time: 12/21/18 12:19 AM  Result Value Ref Range   WBC 10.4 4.0 - 10.5 K/uL   RBC 3.84 (L) 3.87 - 5.11 MIL/uL   Hemoglobin 11.3 (L) 12.0 - 15.0 g/dL   HCT 35.8 (L) 36.0 - 46.0 %   MCV 93.2 80.0 - 100.0 fL   MCH 29.4 26.0 - 34.0 pg   MCHC 31.6 30.0 - 36.0 g/dL   RDW 13.4 11.5 - 15.5 %   Platelets 319 150 - 400 K/uL   nRBC 0.0 0.0 - 0.2 %   Neutrophils Relative % 71 %   Neutro Abs 7.5 1.7 - 7.7 K/uL   Lymphocytes Relative 15 %   Lymphs Abs 1.6 0.7 - 4.0 K/uL   Monocytes Relative 9 %   Monocytes Absolute 1.0 0.1 - 1.0 K/uL   Eosinophils Relative 3 %   Eosinophils Absolute 0.3 0.0 - 0.5 K/uL   Basophils Relative 1 %   Basophils Absolute 0.1 0.0 - 0.1 K/uL   Immature Granulocytes 1 %   Abs Immature Granulocytes 0.05 0.00 - 0.07 K/uL    Comment: Performed at [facility], [address], [place], [place] [address]  Basic metabolic panel     Status: Abnormal   Collection Time: 12/21/18 12:19 AM  Result Value Ref Range   Sodium 136 135 - 145 mmol/L   Potassium 5.0 3.5 - 5.1 mmol/L   Chloride 106 98 - 111 mmol/L   CO2 23 22 - 32 mmol/L   Glucose, Bld 140 (H) 70 - 99 mg/dL   BUN 35 (H) 8 - 23 mg/dL   Creatinine, Ser 1.21 (H) 0.44 - 1.00 mg/dL   Calcium 8.9 8.9 - 10.3 mg/dL   GFR calc non Af Amer 39 (L) >60 mL/min   GFR calc Af Amer 45 (L) >60 mL/min   Anion gap 7 5 - 15    Comment: Performed at [facility] [address] [place], [place] [address]  Urinalysis, Routine w reflex microscopic     Status: None   Collection Time: 12/21/18  3:47 AM  Result Value Ref Range   Color, Urine YELLOW YELLOW   APPearance CLEAR CLEAR   Specific Gravity, Urine 1.021 1.005 - 1.030   pH 5.0 5.0 - 8.0   Glucose, UA NEGATIVE NEGATIVE mg/dL   Hgb urine dipstick NEGATIVE NEGATIVE   Bilirubin Urine  NEGATIVE NEGATIVE   Ketones, ur NEGATIVE NEGATIVE mg/dL   Protein, ur NEGATIVE NEGATIVE mg/dL   Nitrite NEGATIVE NEGATIVE   Leukocytes,Ua NEGATIVE NEGATIVE    Comment: Performed at [facility],  [address] [place], [place] [address]   Dg Chest 1 View  Result Date: 12/21/2018 CLINICAL DATA:  Leg pain EXAM: CHEST  1 VIEW COMPARISON:  07/20/2014 FINDINGS: No acute consolidation or effusion. Mild cardiomegaly with aortic atherosclerosis. No pneumothorax. IMPRESSION: No active disease.  Mild cardiomegaly Electronically Signed   By: Donavan Foil M.D.   On: 12/21/2018 01:25   Dg Lumbar Spine Complete  Result Date: 12/21/2018 CLINICAL DATA:  Leg pain EXAM: LUMBAR SPINE - COMPLETE 4+ VIEW COMPARISON:  MRI 01/02/2007 FINDINGS: Dense aortic atherosclerosis. Study limited by scoliosis and osteopenia. Multiple treated compression fractures at L3, T12 and L1 with additional treated compression deformities at the lower thoracic spine. Moderate severe compression fracture T11, chronic. Advanced degenerative change at L4-L5 and L5-S1. Moderate severe degenerative change L3-L4. IMPRESSION: 1. Limited study secondary to scoliosis and osteopenia. 2. Multiple treated compression fractures of the thoracolumbar spine with multiple level degenerative changes, most marked at L3 through S1. No gross acute osseous abnormality. Electronically Signed   By: Donavan Foil M.D.   On: 12/21/2018 01:30   Ct Head Wo Contrast  Result Date: 12/21/2018 CLINICAL DATA:  Fall with head trauma EXAM: CT HEAD WITHOUT CONTRAST CT CERVICAL SPINE WITHOUT CONTRAST TECHNIQUE: Multidetector CT imaging of the head and cervical spine was performed following the standard protocol without intravenous contrast. Multiplanar CT image reconstructions of the cervical spine were also generated. COMPARISON:  Head CT 11/14/2005 FINDINGS: CT HEAD FINDINGS Brain: There is no mass, hemorrhage or extra-axial collection. There is generalized atrophy  without lobar predilection. Areas of hypoattenuation of the deep gray nuclei and confluent periventricular white matter hypodensity, consistent with chronic small vessel disease. Old left basal ganglia lacunar infarct. Vascular: Atherosclerotic calcification of the internal carotid arteries at the skull base. No abnormal hyperdensity of the major intracranial arteries or dural venous sinuses. Skull: Right frontoparietal scalp hematoma.  No skull fracture. Sinuses/Orbits: No fluid levels or advanced mucosal thickening of the visualized paranasal sinuses. No mastoid or middle ear effusion. The orbits are normal. CT CERVICAL SPINE FINDINGS Alignment: Grade 1 anterolisthesis at C3-4 and C4-5, likely secondary to facet arthrosis. Skull base and vertebrae: No acute fracture. Soft tissues and spinal canal: No prevertebral fluid or swelling. No visible canal hematoma. Disc levels: There is severe overgrowth at the right atlantoaxial joint with a large anterior osteophyte. Hypertrophy and calcific fragments surround the odontoid process. There is multilevel severe facet hypertrophy. No bony spinal canal stenosis. Severe left C3-4, left C4-5, right C5-6 neural foraminal stenosis. Upper chest: No pneumothorax, pulmonary nodule or pleural effusion. Other: Normal visualized paraspinal cervical soft tissues. IMPRESSION: 1. No acute intracranial abnormality. Findings of chronic small vessel disease and generalized atrophy. 2. Right frontoparietal scalp hematoma without skull fracture. 3. No acute fracture of the cervical spine. 4. Severe chronic atlantoaxial degenerative change. 5. Multilevel severe facet arthrosis with associated foraminal stenosis. Electronically Signed   By: Ulyses Jarred M.D.   On: 12/21/2018 01:19   Ct Cervical Spine Wo Contrast  Result Date: 12/21/2018 CLINICAL DATA:  Fall with head trauma EXAM: CT HEAD WITHOUT CONTRAST CT CERVICAL SPINE WITHOUT CONTRAST TECHNIQUE: Multidetector CT imaging of the head and  cervical spine was performed following the standard protocol without intravenous contrast. Multiplanar CT image reconstructions of the cervical spine were also generated. COMPARISON:  Head CT 11/14/2005 FINDINGS: CT HEAD FINDINGS Brain: There is no mass, hemorrhage or extra-axial collection. There is generalized atrophy without lobar predilection. Areas of hypoattenuation of the deep gray  nuclei and confluent periventricular white matter hypodensity, consistent with chronic small vessel disease. Old left basal ganglia lacunar infarct. Vascular: Atherosclerotic calcification of the internal carotid arteries at the skull base. No abnormal hyperdensity of the major intracranial arteries or dural venous sinuses. Skull: Right frontoparietal scalp hematoma.  No skull fracture. Sinuses/Orbits: No fluid levels or advanced mucosal thickening of the visualized paranasal sinuses. No mastoid or middle ear effusion. The orbits are normal. CT CERVICAL SPINE FINDINGS Alignment: Grade 1 anterolisthesis at C3-4 and C4-5, likely secondary to facet arthrosis. Skull base and vertebrae: No acute fracture. Soft tissues and spinal canal: No prevertebral fluid or swelling. No visible canal hematoma. Disc levels: There is severe overgrowth at the right atlantoaxial joint with a large anterior osteophyte. Hypertrophy and calcific fragments surround the odontoid process. There is multilevel severe facet hypertrophy. No bony spinal canal stenosis. Severe left C3-4, left C4-5, right C5-6 neural foraminal stenosis. Upper chest: No pneumothorax, pulmonary nodule or pleural effusion. Other: Normal visualized paraspinal cervical soft tissues. IMPRESSION: 1. No acute intracranial abnormality. Findings of chronic small vessel disease and generalized atrophy. 2. Right frontoparietal scalp hematoma without skull fracture. 3. No acute fracture of the cervical spine. 4. Severe chronic atlantoaxial degenerative change. 5. Multilevel severe facet arthrosis  with associated foraminal stenosis. Electronically Signed   By: Ulyses Jarred M.D.   On: 12/21/2018 01:19   Dg Hip Unilat W Or Wo Pelvis 2-3 Views Right  Result Date: 12/21/2018 CLINICAL DATA:  Leg pain EXAM: DG HIP (WITH OR WITHOUT PELVIS) 2-3V RIGHT COMPARISON:  None. FINDINGS: SI joints are non widened. Pubic symphysis appears intact. Probable chronic fracture deformity of the left inferior pubic ramus. Acute comminuted displaced right intertrochanteric fracture. Right femoral head projects in joint. The shaft of the femur is migrated superiorly. There are vascular calcifications. IMPRESSION: 1. Acute comminuted and displaced right intertrochanteric fracture 2. Probable chronic fracture of the left inferior pubic ramus Electronically Signed   By: Donavan Foil M.D.   On: 12/21/2018 01:26    Pending Labs Unresulted Labs (From admission, onward)   None      Vitals/Pain Today's Vitals   12/21/18 0815 12/21/18 0830 12/21/18 0845 12/21/18 0900  BP: (!) 148/56 (!) 146/63 136/62 (!) 147/53  Pulse: 71 71 70 71  Resp: 17 19 15  (!) 23  Temp:      TempSrc:      SpO2: 100% 99% 97% 99%  PainSc:        Isolation Precautions No active isolations  Medications Medications  chlorhexidine (HIBICLENS) 4 % liquid 4 application (0 application Topical Hold 12/21/18 0941)  povidone-iodine 10 % swab 2 application (0 application Topical Hold 12/21/18 0941)  demeclocycline (DECLOMYCIN) tablet 150 mg (has no administration in time range)  amLODipine (NORVASC) tablet 7.5 mg (has no administration in time range)  carvedilol (COREG) tablet 12.5 mg (12.5 mg Oral Given 12/21/18 0803)  fentaNYL (SUBLIMAZE) injection 25 mcg (25 mcg Intravenous Given 12/21/18 0803)  fentaNYL (SUBLIMAZE) injection 50 mcg (50 mcg Intravenous Given 12/21/18 0237)    Mobility walks with person assist High fall risk   Focused Assessments A&O x4   R Recommendations: See Admitting Provider Note  Report given to:   Additional  Notes:  Right hip surgery today, NPO sips with meds. Right hip fracture.

## 2018-12-21 NOTE — Anesthesia Procedure Notes (Signed)
Procedure Name: MAC Date/Time: 12/21/2018 3:30 PM Performed by: Kathryne Hitch, CRNA Pre-anesthesia Checklist: Patient identified, Emergency Drugs available, Suction available, Patient being monitored and Timeout performed Patient Re-evaluated:Patient Re-evaluated prior to induction Oxygen Delivery Method: Nasal cannula Preoxygenation: Pre-oxygenation with 100% oxygen Induction Type: IV induction Dental Injury: Teeth and Oropharynx as per pre-operative assessment

## 2018-12-21 NOTE — H&P (Signed)
History and Physical    [name] BMW:[id number] DOB: [birth year] DOA: 12/20/2018  PCP: Lajean Manes, MD  [facility]  Chief Complaint: Fall.  HPI: [name] is a 85 y.o. female with history of Takotsubo's cardiomyopathy which has improved last EF measured was in December 2019 EF of 55 to 60% with grade 1 diastolic dysfunction on Lasix, hypertension, paroxysmal atrial fibrillation not on anticoagulation, depression, SIADH on demeclocycline had a fall at her living facility when she was trying to turn.  Had hit her head and has a small hematoma.  Denies losing consciousness.  Denies any chest pain or shortness of breath or palpitations.  ED Course: In the ER patient had a CT scan of the head C-spine x-rays of the lumbar spine and hip shows acute fracture of the right hip.  CAT scan of the head shows scalp hematoma.  EKG shows normal sinus rhythm with RBBB.  On-call orthopedic surgeon Dr. Stann Mainland was consulted and admitted for further management of the right hip fracture.  Review of Systems: As per HPI, rest all negative.   Past Medical History:  Diagnosis Date  . Amputation of finger of right hand   . Aortic stenosis, mild 03/03/2016   By echo 08/2015  . Arthritis   . Back pain   . Bradycardia 08/28/2015  . CKD (chronic kidney disease) stage 3, GFR 30-59 ml/min (HCC)   . Depression   . GERD (gastroesophageal reflux disease)   . GI bleed due to NSAIDs   . Hyperlipidemia   . Hypertension   . Macular degeneration   . Maxillary sinusitis   . MVP (mitral valve prolapse) 03/03/2016   Bileaflet MVP with mild MR by echo 2016  . Old MI (myocardial infarction)    NSTEMI secondary to stress MI/Takotsubo CM, minimal nonobstructive ASCAD at cath  . Osteoporosis   . Persistent atrial fibrillation 2006   not on anticoagulation due to GI bleed and increased fall risk  . PMR (polymyalgia rheumatica) (HCC)   . SIADH (syndrome of  inappropriate ADH production) ([place])   . Takotsubo cardiomyopathy    EF normalized to 70%  . Ventricular tachycardia ([place])    on initial presentation of MI    Past Surgical History:  Procedure Laterality Date  . CARDIAC CATHETERIZATION     normal coronary arteries  . CHOLECYSTECTOMY  11/10/2011   Procedure: LAPAROSCOPIC CHOLECYSTECTOMY WITH INTRAOPERATIVE CHOLANGIOGRAM;  Surgeon: Pedro Earls, MD;  Location: [facility];  Service: General;  Laterality: N/A;  . EYE SURGERY  06/10/11   membrane peel   . NASAL SINUS SURGERY Left   . ROTATOR CUFF REPAIR  1998  . UMBILICAL HERNIA REPAIR  11/10/2011   Procedure: HERNIA REPAIR UMBILICAL ADULT;  Surgeon: Pedro Earls, MD;  Location: [facility];  Service: General;  Laterality: N/A;  . WISDOM TOOTH EXTRACTION       reports that she has never smoked. She has never used smokeless tobacco. She reports that she does not drink alcohol or use drugs.  Allergies  Allergen Reactions  . Amlodipine Swelling    To feet and ankles   . Avelox [Moxifloxacin Hcl In Nacl] Other (See Comments)    Pt does not remember   . Duragesic Disc Transdermal System [Fentanyl] Other (See Comments)    Reaction unknown  . Morphine And Related Other (See Comments)    "My Sister and daughter can,t take it. I've had it before without problems."  . Teriparatide (  Recombinant) Other (See Comments)    Made skin dry  . Lidocaine Rash    Family History  Problem Relation Age of Onset  . Cancer Mother     Prior to Admission medications   Medication Sig Start Date End Date Taking? Authorizing Provider  acetaminophen (TYLENOL) 500 MG tablet Take 500 mg by mouth every 6 (six) hours as needed for mild pain, fever or headache.    [provider]  amLODipine (NORVASC) 2.5 MG tablet TAKE 1 TABLET BY MOUTH W/ 5MG  TAB DAILY 02/21/18   Turner, Eber Hong, MD  amLODipine (NORVASC) 5 MG tablet TAKE 1 TABLET BY MOUTH EVERY DAY 06/29/18   [name], MD  aspirin EC 81 MG tablet  Take 81 mg by mouth at bedtime.     [provider]  azelastine (ASTELIN) 0.1 % nasal spray Place 2 sprays into both nostrils 2 (two) times daily. Use in each nostril as directed    [provider]  buPROPion (WELLBUTRIN SR) 150 MG 12 hr tablet Take 150 mg by mouth every morning.    [provider]  carvedilol (COREG) 12.5 MG tablet Take 1 tablet (12.5 mg total) by mouth 2 (two) times daily with a meal. 04/12/18   Turner, Eber Hong, MD  cetirizine (ZYRTEC) 10 MG tablet Take 10 mg by mouth daily.     [provider]  demeclocycline (DECLOMYCIN) 150 MG tablet Take 150 mg by mouth 2 (two) times daily.  08/17/11   [provider]  diclofenac sodium (VOLTAREN) 1 % GEL Apply 1 application topically 4 (four) times daily as needed. Pain     [provider]  fish oil-omega-3 fatty acids 1000 MG capsule Take 1 g by mouth daily.     [provider]  furosemide (LASIX) 20 MG tablet Take 1 tablet (20 mg total) by mouth every other day. 09/21/18 09/21/19  [name], MD  ibuprofen (ADVIL,MOTRIN) 200 MG tablet Take 200 mg by mouth every 8 (eight) hours as needed for moderate pain.     [provider]  Multiple Vitamins-Minerals (CENTRUM CARDIO PO) Take 1 tablet by mouth 2 (two) times daily.     [provider]  polyethylene glycol (MIRALAX / GLYCOLAX) packet Take 17 g by mouth every other day.    [provider]  pregabalin (LYRICA) 50 MG capsule Take 50 mg by mouth daily.     [provider]  raNITIdine HCl (ZANTAC PO) Take by mouth as directed.    [provider]  traMADol (ULTRAM-ER) 300 MG 24 hr tablet Take 300 mg by mouth daily as needed for pain.  09/28/11   [provider]    Physical Exam: Vitals:   12/21/18 0015 12/21/18 0045 12/21/18 0100 12/21/18 0145  BP: (!) 150/50 (!) 149/69 (!) 144/49 (!) 155/53  Pulse: 65 (!) 120 66 69  Resp: 14 16 17 15   Temp: 97.7 F (36.5 C)     TempSrc:  Oral     SpO2: 97% 100% 98% 100%      Constitutional: Moderately built and nourished. Vitals:   12/21/18 0015 12/21/18 0045 12/21/18 0100 12/21/18 0145  BP: (!) 150/50 (!) 149/69 (!) 144/49 (!) 155/53  Pulse: 65 (!) 120 66 69  Resp: 14 16 17 15   Temp: 97.7 F (36.5 C)     TempSrc: Oral     SpO2: 97% 100% 98% 100%   Eyes: Anicteric no pallor. ENMT: No discharge from the ears eyes nose  and mouth. Neck: No mass felt.  No neck rigidity. Respiratory: No rhonchi or crepitations. Cardiovascular: S1-S2 heard. Abdomen: Soft nontender bowel sounds present. Musculoskeletal: Pain on moving right hip. Skin: No rash. Neurologic: Alert awake oriented to time place and person.  Moves all extremities. Psychiatric: Appears normal.  Normal affect.   Labs on Admission: I have personally reviewed following labs and imaging studies  CBC: Recent Labs  Lab 12/21/18 0019  WBC 10.4  NEUTROABS 7.5  HGB 11.3*  HCT 35.8*  MCV 93.2  PLT 767   Basic Metabolic Panel: Recent Labs  Lab 12/21/18 0019  NA 136  K 5.0  CL 106  CO2 23  GLUCOSE 140*  BUN 35*  CREATININE 1.21*  CALCIUM 8.9   GFR: CrCl cannot be calculated (Unknown ideal weight.). Liver Function Tests: No results for input(s): AST, ALT, ALKPHOS, BILITOT, PROT, ALBUMIN in the last 168 hours. No results for input(s): LIPASE, AMYLASE in the last 168 hours. No results for input(s): AMMONIA in the last 168 hours. Coagulation Profile: No results for input(s): INR, PROTIME in the last 168 hours. Cardiac Enzymes: No results for input(s): CKTOTAL, CKMB, CKMBINDEX, TROPONINI in the last 168 hours. BNP (last 3 results) Recent Labs    05/02/18 1514 09/19/18 1551  PROBNP 419 485   HbA1C: No results for input(s): HGBA1C in the last 72 hours. CBG: No results for input(s): GLUCAP in the last 168 hours. Lipid Profile: No results for input(s): CHOL, HDL, LDLCALC, TRIG, CHOLHDL, LDLDIRECT in the last 72 hours. Thyroid Function  Tests: No results for input(s): TSH, T4TOTAL, FREET4, T3FREE, THYROIDAB in the last 72 hours. Anemia Panel: No results for input(s): VITAMINB12, FOLATE, FERRITIN, TIBC, IRON, RETICCTPCT in the last 72 hours. Urine analysis:    Component Value Date/Time   COLORURINE YELLOW 07/20/2014 [place] 07/20/2014 1209   LABSPEC 1.013 07/20/2014 1209   PHURINE 6.0 07/20/2014 1209   GLUCOSEU NEGATIVE 07/20/2014 1209   HGBUR TRACE (A) 07/20/2014 1209   BILIRUBINUR NEGATIVE 07/20/2014 1209   KETONESUR NEGATIVE 07/20/2014 1209   PROTEINUR 100 (A) 07/20/2014 1209   UROBILINOGEN 1.0 07/20/2014 1209   NITRITE NEGATIVE 07/20/2014 1209   LEUKOCYTESUR NEGATIVE 07/20/2014 1209   Sepsis Labs: @LABRCNTIP (procalcitonin:4,lacticidven:4) )No results found for this or any previous visit (from the past 240 hour(s)).   Radiological Exams on Admission: Dg Chest 1 View  Result Date: 12/21/2018 CLINICAL DATA:  Leg pain EXAM: CHEST  1 VIEW COMPARISON:  07/20/2014 FINDINGS: No acute consolidation or effusion. Mild cardiomegaly with aortic atherosclerosis. No pneumothorax. IMPRESSION: No active disease.  Mild cardiomegaly Electronically Signed   By: Donavan Foil M.D.   On: 12/21/2018 01:25   Dg Lumbar Spine Complete  Result Date: 12/21/2018 CLINICAL DATA:  Leg pain EXAM: LUMBAR SPINE - COMPLETE 4+ VIEW COMPARISON:  MRI 01/02/2007 FINDINGS: Dense aortic atherosclerosis. Study limited by scoliosis and osteopenia. Multiple treated compression fractures at L3, T12 and L1 with additional treated compression deformities at the lower thoracic spine. Moderate severe compression fracture T11, chronic. Advanced degenerative change at L4-L5 and L5-S1. Moderate severe degenerative change L3-L4. IMPRESSION: 1. Limited study secondary to scoliosis and osteopenia. 2. Multiple treated compression fractures of the thoracolumbar spine with multiple level degenerative changes, most marked at L3 through S1. No gross acute  osseous abnormality. Electronically Signed   By: Donavan Foil M.D.   On: 12/21/2018 01:30   Ct Head Wo Contrast  Result Date: 12/21/2018 CLINICAL DATA:  Fall with head trauma EXAM: CT  HEAD WITHOUT CONTRAST CT CERVICAL SPINE WITHOUT CONTRAST TECHNIQUE: Multidetector CT imaging of the head and cervical spine was performed following the standard protocol without intravenous contrast. Multiplanar CT image reconstructions of the cervical spine were also generated. COMPARISON:  Head CT 11/14/2005 FINDINGS: CT HEAD FINDINGS Brain: There is no mass, hemorrhage or extra-axial collection. There is generalized atrophy without lobar predilection. Areas of hypoattenuation of the deep gray nuclei and confluent periventricular white matter hypodensity, consistent with chronic small vessel disease. Old left basal ganglia lacunar infarct. Vascular: Atherosclerotic calcification of the internal carotid arteries at the skull base. No abnormal hyperdensity of the major intracranial arteries or dural venous sinuses. Skull: Right frontoparietal scalp hematoma.  No skull fracture. Sinuses/Orbits: No fluid levels or advanced mucosal thickening of the visualized paranasal sinuses. No mastoid or middle ear effusion. The orbits are normal. CT CERVICAL SPINE FINDINGS Alignment: Grade 1 anterolisthesis at C3-4 and C4-5, likely secondary to facet arthrosis. Skull base and vertebrae: No acute fracture. Soft tissues and spinal canal: No prevertebral fluid or swelling. No visible canal hematoma. Disc levels: There is severe overgrowth at the right atlantoaxial joint with a large anterior osteophyte. Hypertrophy and calcific fragments surround the odontoid process. There is multilevel severe facet hypertrophy. No bony spinal canal stenosis. Severe left C3-4, left C4-5, right C5-6 neural foraminal stenosis. Upper chest: No pneumothorax, pulmonary nodule or pleural effusion. Other: Normal visualized paraspinal cervical soft tissues. IMPRESSION: 1.  No acute intracranial abnormality. Findings of chronic small vessel disease and generalized atrophy. 2. Right frontoparietal scalp hematoma without skull fracture. 3. No acute fracture of the cervical spine. 4. Severe chronic atlantoaxial degenerative change. 5. Multilevel severe facet arthrosis with associated foraminal stenosis. Electronically Signed   By: Ulyses Jarred M.D.   On: 12/21/2018 01:19   Ct Cervical Spine Wo Contrast  Result Date: 12/21/2018 CLINICAL DATA:  Fall with head trauma EXAM: CT HEAD WITHOUT CONTRAST CT CERVICAL SPINE WITHOUT CONTRAST TECHNIQUE: Multidetector CT imaging of the head and cervical spine was performed following the standard protocol without intravenous contrast. Multiplanar CT image reconstructions of the cervical spine were also generated. COMPARISON:  Head CT 11/14/2005 FINDINGS: CT HEAD FINDINGS Brain: There is no mass, hemorrhage or extra-axial collection. There is generalized atrophy without lobar predilection. Areas of hypoattenuation of the deep gray nuclei and confluent periventricular white matter hypodensity, consistent with chronic small vessel disease. Old left basal ganglia lacunar infarct. Vascular: Atherosclerotic calcification of the internal carotid arteries at the skull base. No abnormal hyperdensity of the major intracranial arteries or dural venous sinuses. Skull: Right frontoparietal scalp hematoma.  No skull fracture. Sinuses/Orbits: No fluid levels or advanced mucosal thickening of the visualized paranasal sinuses. No mastoid or middle ear effusion. The orbits are normal. CT CERVICAL SPINE FINDINGS Alignment: Grade 1 anterolisthesis at C3-4 and C4-5, likely secondary to facet arthrosis. Skull base and vertebrae: No acute fracture. Soft tissues and spinal canal: No prevertebral fluid or swelling. No visible canal hematoma. Disc levels: There is severe overgrowth at the right atlantoaxial joint with a large anterior osteophyte. Hypertrophy and calcific  fragments surround the odontoid process. There is multilevel severe facet hypertrophy. No bony spinal canal stenosis. Severe left C3-4, left C4-5, right C5-6 neural foraminal stenosis. Upper chest: No pneumothorax, pulmonary nodule or pleural effusion. Other: Normal visualized paraspinal cervical soft tissues. IMPRESSION: 1. No acute intracranial abnormality. Findings of chronic small vessel disease and generalized atrophy. 2. Right frontoparietal scalp hematoma without skull fracture. 3. No acute fracture of the cervical  spine. 4. Severe chronic atlantoaxial degenerative change. 5. Multilevel severe facet arthrosis with associated foraminal stenosis. Electronically Signed   By: Ulyses Jarred M.D.   On: 12/21/2018 01:19   Dg Hip Unilat W Or Wo Pelvis 2-3 Views Right  Result Date: 12/21/2018 CLINICAL DATA:  Leg pain EXAM: DG HIP (WITH OR WITHOUT PELVIS) 2-3V RIGHT COMPARISON:  None. FINDINGS: SI joints are non widened. Pubic symphysis appears intact. Probable chronic fracture deformity of the left inferior pubic ramus. Acute comminuted displaced right intertrochanteric fracture. Right femoral head projects in joint. The shaft of the femur is migrated superiorly. There are vascular calcifications. IMPRESSION: 1. Acute comminuted and displaced right intertrochanteric fracture 2. Probable chronic fracture of the left inferior pubic ramus Electronically Signed   By: Donavan Foil M.D.   On: 12/21/2018 01:26    EKG: Independently reviewed.  Normal sinus rhythm with RBBB.  Assessment/Plan Principal Problem:   Closed right hip fracture (HCC) Active Problems:   Essential hypertension, benign   Takotsubo cardiomyopathy   SIADH (syndrome of inappropriate ADH production) (HCC)   Persistent atrial fibrillation (HCC)   MVP (mitral valve prolapse)   Closed right hip fracture, initial encounter ([place])    1. Right hip fracture status post mechanical fall -patient kept n.p.o. in anticipation of surgery.  Patient is  at moderate risk for intermediate risk procedure.  Pain relief medications.  Note that patient is tolerating fentanyl though it is in her allergy list. 2. Hypertension we will continue amlodipine and Coreg. 3. Paroxysmal atrial fibrillation on Coreg but not on anticoagulation exact reason not known.  Rate controlled.  Presently in sinus rhythm. 4. SIADH on demeclocycline.  Follow metabolic panel. 5. History of Takotsubo's cardiomyopathy improved EF at this time with last 2D echo done in December 2019 showed EF of 55 to 60% with grade 1 diastolic dysfunction.  Appears compensated.  Hold Lasix for now and restart after surgery. 6. Normocytic normochromic anemia follow CBC.   DVT prophylaxis: SCDs. Code Status: DNR as confirmed with patient's son. Family Communication: [name]. Disposition Plan: May need rehab. Consults called: Orthopedics. Admission status: Inpatient.   Rise Patience MD [facility] Pager [phone].  If 7PM-7AM, please contact night-coverage [email or web address] Password [email or web address]  12/21/2018, 2:14 AM

## 2018-12-21 NOTE — Anesthesia Procedure Notes (Signed)
Spinal  Patient location during procedure: OR Start time: 12/21/2018 3:25 PM End time: 12/21/2018 3:30 PM Staffing Anesthesiologist: Murvin Natal, MD Performed: anesthesiologist  Preanesthetic Checklist Completed: patient identified, surgical consent, pre-op evaluation, timeout performed, IV checked, risks and benefits discussed and monitors and equipment checked Spinal Block Patient position: right lateral decubitus Prep: DuraPrep Patient monitoring: cardiac monitor, continuous pulse ox and blood pressure Approach: right paramedian Location: L4-5 Injection technique: single-shot Needle Needle type: Pencan  Needle gauge: 24 G Needle length: 9 cm Assessment Sensory level: T10 Additional Notes Functioning IV was confirmed and monitors were applied. Sterile prep and drape, including hand hygiene and sterile gloves were used. The patient was positioned and the spine was prepped. The skin was anesthetized with lidocaine.  Free flow of clear CSF was obtained prior to injecting local anesthetic into the CSF.  The spinal needle aspirated freely following injection.  The needle was carefully withdrawn.  The patient tolerated the procedure well.

## 2018-12-21 NOTE — Progress Notes (Signed)
Initial Nutrition Assessment  DOCUMENTATION CODES:   Severe malnutrition in context of social or environmental circumstances  INTERVENTION:   After surgery, begin:  Ensure Enlive po BID, each supplement provides 350 kcal and 20 grams of protein  Multivitamin daily  NUTRITION DIAGNOSIS:   Severe Malnutrition related to social / environmental circumstances(advanced age with multiple chronic illnesses) as evidenced by severe fat depletion, severe muscle depletion.  GOAL:   Patient will meet greater than or equal to 90% of their needs  MONITOR:   PO intake, Supplement acceptance, Skin  REASON FOR ASSESSMENT:   Consult Hip fracture protocol  ASSESSMENT:   85 yo female with PMH of arthritis, Takutsubo's cardiomyopathy, SIADH, HTN, PMR, HLD, osteoporosis, CKD, macular degeneration, MVP, A fib who was admitted with right hip fx [facility]/p fall at [facility].   Plans for surgery today. Currently NPO.  Spoke with patient and her son. Patient typically eats 3 meals per day at [facility] independent living facility. Son or another family member sits with patient during evening meals. Son says she eats well and does not take any PO supplements. Patient says she has a good appetite. At recent MD appointment patient had lost ~5 lbs.  NFPE revealed severe muscle and subcutaneous fat depletion. Could be partially related to advanced age, but suspect intake is inadequate.   Labs and medications reviewed.  NUTRITION - FOCUSED PHYSICAL EXAM:    Most Recent Value  Orbital Region  Severe depletion  Upper Arm Region  Severe depletion  Thoracic and Lumbar Region  Moderate depletion  Buccal Region  Severe depletion  Temple Region  Severe depletion  Clavicle Bone Region  Severe depletion  Clavicle and Acromion Bone Region  Severe depletion  Scapular Bone Region  Unable to assess  Dorsal Hand  Severe depletion  Patellar Region  Severe depletion  Anterior Thigh Region  Severe depletion  Posterior  Calf Region  Severe depletion  Edema (RD Assessment)  None  Hair  Reviewed  Eyes  Reviewed  Mouth  Reviewed  Skin  Reviewed  Nails  Reviewed       Diet Order:   Diet Order            Diet NPO time specified Except for: Sips with Meds  Diet effective midnight              EDUCATION NEEDS:   No education needs have been identified at this time  Skin:  Skin Assessment: Reviewed RN Assessment  Last BM:  no BM documented since admission  Height:   Ht Readings from Last 1 Encounters:  12/21/18 5\' 3"  (1.6 m)    Weight:   Wt Readings from Last 1 Encounters:  09/19/18 54.5 kg    Ideal Body Weight:  52.3 kg  BMI:  Body mass index is 21.28 kg/m.  Estimated Nutritional Needs:   Kcal:  1400-1600  Protein:  75-90 gm  Fluid:  1.5 L    Molli Barrows, RD, LDN, [place] Pager [phone] After Hours Pager [phone]

## 2018-12-21 NOTE — Brief Op Note (Signed)
12/21/2018  4:18 PM  PATIENT:  [name]  85 y.o. female  PRE-OPERATIVE DIAGNOSIS:  right hip fracture  POST-OPERATIVE DIAGNOSIS:  right hip fracture  PROCEDURE:  Procedure(s): INTRAMEDULLARY (IM) NAIL INTERTROCHANTRIC (Right)  SURGEON:  Surgeon(s) and Role:    * Nicholes Stairs, MD - Primary  PHYSICIAN ASSISTANT:   ASSISTANTS: none   ANESTHESIA:   spinal  EBL:  50 mL   BLOOD ADMINISTERED:none  DRAINS: none   LOCAL MEDICATIONS USED:  NONE  SPECIMEN:  No Specimen  DISPOSITION OF SPECIMEN:  N/A  COUNTS:  YES  TOURNIQUET:  * No tourniquets in log *  DICTATION: .Note written in EPIC  PLAN OF CARE: Admit to inpatient   PATIENT DISPOSITION:  PACU - hemodynamically stable.   Delay start of Pharmacological VTE agent (>24hrs) due to surgical blood loss or risk of bleeding: not applicable

## 2018-12-21 NOTE — Consult Note (Signed)
ORTHOPAEDIC CONSULTATION  REQUESTING PHYSICIAN: Lavina Hamman, MD  PCP:  Lajean Manes, MD  Chief Complaint: Right hip fracture.  HPI: [name] is a 85 y.o. female who complains of right hip pain following a fall at home prior to presentation to the emergency department earlier this morning.  She does live in an independent living situation at friend's home [place] here in [place].  She does use the aid of a walker and/or cane at times.  Currently she describes right groin pain as well as hip pain and inability to bear weight.  She presented to the emergency department where x-rays demonstrated a right intertrochanteric hip fracture.  I was consulted for operative management.  Currently she is here with her son and daughter-in-law as well as 2 grandchildren.  They help provide history to me.  She is able to do such on her own as well.  She denies any numbness or tingling currently.  No history of previous right hip surgery.  Past Medical History:  Diagnosis Date  . Amputation of finger of right hand   . Aortic stenosis, mild 03/03/2016   By echo 08/2015  . Arthritis   . Back pain   . Bradycardia 08/28/2015  . CKD (chronic kidney disease) stage 3, GFR 30-59 ml/min (HCC)   . Closed right hip fracture ([place]) 12/2018  . Depression   . GERD (gastroesophageal reflux disease)   . GI bleed due to NSAIDs   . Hyperlipidemia   . Hypertension   . Macular degeneration   . Maxillary sinusitis   . MVP (mitral valve prolapse) 03/03/2016   Bileaflet MVP with mild MR by echo 2016  . Old MI (myocardial infarction)    NSTEMI secondary to stress MI/Takotsubo CM, minimal nonobstructive ASCAD at cath  . Osteoporosis   . Persistent atrial fibrillation 2006   not on anticoagulation due to GI bleed and increased fall risk  . PMR (polymyalgia rheumatica) (HCC)   . SIADH (syndrome of inappropriate ADH production) ([place])   . Takotsubo cardiomyopathy    EF normalized to 70%  .  Ventricular tachycardia ([place])    on initial presentation of MI   Past Surgical History:  Procedure Laterality Date  . CARDIAC CATHETERIZATION     normal coronary arteries  . CHOLECYSTECTOMY  11/10/2011   Procedure: LAPAROSCOPIC CHOLECYSTECTOMY WITH INTRAOPERATIVE CHOLANGIOGRAM;  Surgeon: Pedro Earls, MD;  Location: [facility];  Service: General;  Laterality: N/A;  . EYE SURGERY  06/10/11   membrane peel   . NASAL SINUS SURGERY Left   . ROTATOR CUFF REPAIR  1998  . UMBILICAL HERNIA REPAIR  11/10/2011   Procedure: HERNIA REPAIR UMBILICAL ADULT;  Surgeon: Pedro Earls, MD;  Location: [facility];  Service: General;  Laterality: N/A;  . WISDOM TOOTH EXTRACTION     Social History   Socioeconomic History  . Marital status: Married    Spouse name: Not on file  . Number of children: Not on file  . Years of education: Not on file  . Highest education level: Not on file  Occupational History  . Not on file  Social Needs  . Financial resource strain: Not on file  . Food insecurity:    Worry: Not on file    Inability: Not on file  . Transportation needs:    Medical: Not on file    Non-medical: Not on file  Tobacco Use  . Smoking status: Never Smoker  . Smokeless tobacco: Never Used  Substance and  Sexual Activity  . Alcohol use: No    Alcohol/week: 1.0 standard drinks    Types: 1 Glasses of wine per week    Comment: occassionally  . Drug use: No  . Sexual activity: Not on file  Lifestyle  . Physical activity:    Days per week: Not on file    Minutes per session: Not on file  . Stress: Not on file  Relationships  . Social connections:    Talks on phone: Not on file    Gets together: Not on file    Attends religious service: Not on file    Active member of club or organization: Not on file    Attends meetings of clubs or organizations: Not on file    Relationship status: Not on file  Other Topics Concern  . Not on file  Social History Narrative  . Not on file   Family  History  Problem Relation Age of Onset  . Cancer Mother    Allergies  Allergen Reactions  . Amlodipine Swelling    To feet and ankles   . Avelox [Moxifloxacin Hcl In Nacl] Other (See Comments)    Pt does not remember   . Duragesic Disc Transdermal System [Fentanyl] Other (See Comments)    Reaction unknown  . Morphine And Related Other (See Comments)    "My Sister and daughter can,t take it. I've had it before without problems."  . Teriparatide (Recombinant) Other (See Comments)    Made skin dry  . Lidocaine Rash   Prior to Admission medications   Medication Sig Start Date End Date Taking? Authorizing Provider  acetaminophen (TYLENOL) 500 MG tablet Take 500 mg by mouth every 6 (six) hours as needed for mild pain, fever or headache.   Yes [provider]  amLODipine (NORVASC) 2.5 MG tablet TAKE 1 TABLET BY MOUTH W/ 5MG  TAB DAILY Patient taking differently: Take 7.5 mg by mouth daily. Takes along with a 5 mg tablet 02/21/18  Yes Turner, Traci R, MD  amLODipine (NORVASC) 5 MG tablet TAKE 1 TABLET BY MOUTH EVERY DAY Patient taking differently: Take 7.5 mg by mouth daily. Takes along with a 2.5 mg tablet 06/29/18  Yes Turner, Traci R, MD  azelastine (ASTELIN) 0.1 % nasal spray Place 2 sprays into both nostrils 2 (two) times daily as needed for allergies.    Yes [provider]  buPROPion (WELLBUTRIN SR) 150 MG 12 hr tablet Take 150 mg by mouth every morning.   Yes [provider]  carvedilol (COREG) 12.5 MG tablet Take 1 tablet (12.5 mg total) by mouth 2 (two) times daily with a meal. 04/12/18  Yes Turner, Traci R, MD  cetirizine (ZYRTEC) 10 MG tablet Take 10 mg by mouth daily.    Yes [provider]  demeclocycline (DECLOMYCIN) 150 MG tablet Take 150 mg by mouth 2 (two) times daily.  08/17/11  Yes [provider]  diclofenac sodium (VOLTAREN) 1 % GEL Apply 1 application topically 4 (four) times daily as needed (pain).    Yes [provider]    fish oil-omega-3 fatty acids 1000 MG capsule Take 1 g by mouth daily.    Yes [provider]  furosemide (LASIX) 20 MG tablet Take 1 tablet (20 mg total) by mouth every other day. Patient taking differently: Take 20 mg by mouth daily as needed for fluid.  09/21/18 09/21/19 Yes Turner, Eber Hong, MD  ibuprofen (ADVIL,MOTRIN) 200 MG tablet Take 200 mg by mouth every 8 (eight)  hours as needed for moderate pain.    Yes [provider]  Multiple Vitamins-Minerals (CENTRUM CARDIO PO) Take 1 tablet by mouth 2 (two) times daily.    Yes [provider]  polyethylene glycol (MIRALAX / GLYCOLAX) packet Take 17 g by mouth daily as needed for mild constipation.    Yes [provider]  raNITIdine HCl (ZANTAC PO) Take 1 tablet by mouth daily.    Yes [provider]  traMADol (ULTRAM-ER) 300 MG 24 hr tablet Take 300 mg by mouth daily as needed for pain.  09/28/11  Yes [provider]   Dg Chest 1 View  Result Date: 12/21/2018 CLINICAL DATA:  Leg pain EXAM: CHEST  1 VIEW COMPARISON:  07/20/2014 FINDINGS: No acute consolidation or effusion. Mild cardiomegaly with aortic atherosclerosis. No pneumothorax. IMPRESSION: No active disease.  Mild cardiomegaly Electronically Signed   By: Donavan Foil M.D.   On: 12/21/2018 01:25   Dg Lumbar Spine Complete  Result Date: 12/21/2018 CLINICAL DATA:  Leg pain EXAM: LUMBAR SPINE - COMPLETE 4+ VIEW COMPARISON:  MRI 01/02/2007 FINDINGS: Dense aortic atherosclerosis. Study limited by scoliosis and osteopenia. Multiple treated compression fractures at L3, T12 and L1 with additional treated compression deformities at the lower thoracic spine. Moderate severe compression fracture T11, chronic. Advanced degenerative change at L4-L5 and L5-S1. Moderate severe degenerative change L3-L4. IMPRESSION: 1. Limited study secondary to scoliosis and osteopenia. 2. Multiple treated compression fractures of the thoracolumbar spine with multiple level  degenerative changes, most marked at L3 through S1. No gross acute osseous abnormality. Electronically Signed   By: Donavan Foil M.D.   On: 12/21/2018 01:30   Ct Head Wo Contrast  Result Date: 12/21/2018 CLINICAL DATA:  Fall with head trauma EXAM: CT HEAD WITHOUT CONTRAST CT CERVICAL SPINE WITHOUT CONTRAST TECHNIQUE: Multidetector CT imaging of the head and cervical spine was performed following the standard protocol without intravenous contrast. Multiplanar CT image reconstructions of the cervical spine were also generated. COMPARISON:  Head CT 11/14/2005 FINDINGS: CT HEAD FINDINGS Brain: There is no mass, hemorrhage or extra-axial collection. There is generalized atrophy without lobar predilection. Areas of hypoattenuation of the deep gray nuclei and confluent periventricular white matter hypodensity, consistent with chronic small vessel disease. Old left basal ganglia lacunar infarct. Vascular: Atherosclerotic calcification of the internal carotid arteries at the skull base. No abnormal hyperdensity of the major intracranial arteries or dural venous sinuses. Skull: Right frontoparietal scalp hematoma.  No skull fracture. Sinuses/Orbits: No fluid levels or advanced mucosal thickening of the visualized paranasal sinuses. No mastoid or middle ear effusion. The orbits are normal. CT CERVICAL SPINE FINDINGS Alignment: Grade 1 anterolisthesis at C3-4 and C4-5, likely secondary to facet arthrosis. Skull base and vertebrae: No acute fracture. Soft tissues and spinal canal: No prevertebral fluid or swelling. No visible canal hematoma. Disc levels: There is severe overgrowth at the right atlantoaxial joint with a large anterior osteophyte. Hypertrophy and calcific fragments surround the odontoid process. There is multilevel severe facet hypertrophy. No bony spinal canal stenosis. Severe left C3-4, left C4-5, right C5-6 neural foraminal stenosis. Upper chest: No pneumothorax, pulmonary nodule or pleural effusion. Other:  Normal visualized paraspinal cervical soft tissues. IMPRESSION: 1. No acute intracranial abnormality. Findings of chronic small vessel disease and generalized atrophy. 2. Right frontoparietal scalp hematoma without skull fracture. 3. No acute fracture of the cervical spine. 4. Severe chronic atlantoaxial degenerative change. 5. Multilevel severe facet arthrosis with associated foraminal stenosis. Electronically Signed   By: Ulyses Jarred  M.D.   On: 12/21/2018 01:19   Ct Cervical Spine Wo Contrast  Result Date: 12/21/2018 CLINICAL DATA:  Fall with head trauma EXAM: CT HEAD WITHOUT CONTRAST CT CERVICAL SPINE WITHOUT CONTRAST TECHNIQUE: Multidetector CT imaging of the head and cervical spine was performed following the standard protocol without intravenous contrast. Multiplanar CT image reconstructions of the cervical spine were also generated. COMPARISON:  Head CT 11/14/2005 FINDINGS: CT HEAD FINDINGS Brain: There is no mass, hemorrhage or extra-axial collection. There is generalized atrophy without lobar predilection. Areas of hypoattenuation of the deep gray nuclei and confluent periventricular white matter hypodensity, consistent with chronic small vessel disease. Old left basal ganglia lacunar infarct. Vascular: Atherosclerotic calcification of the internal carotid arteries at the skull base. No abnormal hyperdensity of the major intracranial arteries or dural venous sinuses. Skull: Right frontoparietal scalp hematoma.  No skull fracture. Sinuses/Orbits: No fluid levels or advanced mucosal thickening of the visualized paranasal sinuses. No mastoid or middle ear effusion. The orbits are normal. CT CERVICAL SPINE FINDINGS Alignment: Grade 1 anterolisthesis at C3-4 and C4-5, likely secondary to facet arthrosis. Skull base and vertebrae: No acute fracture. Soft tissues and spinal canal: No prevertebral fluid or swelling. No visible canal hematoma. Disc levels: There is severe overgrowth at the right atlantoaxial  joint with a large anterior osteophyte. Hypertrophy and calcific fragments surround the odontoid process. There is multilevel severe facet hypertrophy. No bony spinal canal stenosis. Severe left C3-4, left C4-5, right C5-6 neural foraminal stenosis. Upper chest: No pneumothorax, pulmonary nodule or pleural effusion. Other: Normal visualized paraspinal cervical soft tissues. IMPRESSION: 1. No acute intracranial abnormality. Findings of chronic small vessel disease and generalized atrophy. 2. Right frontoparietal scalp hematoma without skull fracture. 3. No acute fracture of the cervical spine. 4. Severe chronic atlantoaxial degenerative change. 5. Multilevel severe facet arthrosis with associated foraminal stenosis. Electronically Signed   By: Ulyses Jarred M.D.   On: 12/21/2018 01:19   Dg Hip Unilat W Or Wo Pelvis 2-3 Views Right  Result Date: 12/21/2018 CLINICAL DATA:  Leg pain EXAM: DG HIP (WITH OR WITHOUT PELVIS) 2-3V RIGHT COMPARISON:  None. FINDINGS: SI joints are non widened. Pubic symphysis appears intact. Probable chronic fracture deformity of the left inferior pubic ramus. Acute comminuted displaced right intertrochanteric fracture. Right femoral head projects in joint. The shaft of the femur is migrated superiorly. There are vascular calcifications. IMPRESSION: 1. Acute comminuted and displaced right intertrochanteric fracture 2. Probable chronic fracture of the left inferior pubic ramus Electronically Signed   By: Donavan Foil M.D.   On: 12/21/2018 01:26    Positive ROS: All other systems have been reviewed and were otherwise negative with the exception of those mentioned in the HPI and as above.  Physical Exam: General: Alert, no acute distress Cardiovascular: No pedal edema Respiratory: No cyanosis, no use of accessory musculature GI: No organomegaly, abdomen is soft and non-tender Skin: No lesions in the area of chief complaint Neurologic: Sensation intact distally Psychiatric: Patient  is competent for consent with normal mood and affect Lymphatic: No axillary or cervical lymphadenopathy  MUSCULOSKELETAL:  Right lower extremity held in slight external rotation and shortened position.  Otherwise good 2+ dorsalis pedis pulse.  Sensation intact light touch throughout the foot and ankle.  She can voluntarily move the foot and ankle.  Assessment: Closed right hip intertrochanteric fracture.  Plan: -I discussed with the family my recommendation for operative management of this fracture to facilitate early mobilization.  We discussed the recommendation for intramedullary  nail.  She will be able to weight-bear as tolerated immediately following surgery.  She will continue with physical therapy on the floor. -We discussed at length the nature of the procedure.  All questions were solicited and answered to her satisfaction as well as her family's.  She has provided informed consent to proceed. -We will plan on 6 weeks of twice daily aspirin postoperatively. -She will return to the hospitalist service following surgery.    Nicholes Stairs, MD Cell [phone]    12/21/2018 2:49 PM

## 2018-12-22 ENCOUNTER — Encounter (HOSPITAL_COMMUNITY): Payer: Self-pay | Admitting: Orthopedic Surgery

## 2018-12-22 LAB — CBC WITH DIFFERENTIAL/PLATELET
Abs Immature Granulocytes: 0 10*3/uL (ref 0.00–0.07)
BASOS PCT: 0 %
Basophils Absolute: 0 10*3/uL (ref 0.0–0.1)
Eosinophils Absolute: 0 10*3/uL (ref 0.0–0.5)
Eosinophils Relative: 0 %
HCT: 28.7 % — ABNORMAL LOW (ref 36.0–46.0)
Hemoglobin: 9.3 g/dL — ABNORMAL LOW (ref 12.0–15.0)
Lymphocytes Relative: 5 %
Lymphs Abs: 0.5 10*3/uL — ABNORMAL LOW (ref 0.7–4.0)
MCH: 30.4 pg (ref 26.0–34.0)
MCHC: 32.4 g/dL (ref 30.0–36.0)
MCV: 93.8 fL (ref 80.0–100.0)
Monocytes Absolute: 0.5 10*3/uL (ref 0.1–1.0)
Monocytes Relative: 5 %
Neutro Abs: 9.4 10*3/uL — ABNORMAL HIGH (ref 1.7–7.7)
Neutrophils Relative %: 90 %
Platelets: 234 10*3/uL (ref 150–400)
RBC: 3.06 MIL/uL — AB (ref 3.87–5.11)
RDW: 13.5 % (ref 11.5–15.5)
WBC: 10.4 10*3/uL (ref 4.0–10.5)
nRBC: 0 % (ref 0.0–0.2)
nRBC: 0 /100 WBC

## 2018-12-22 LAB — COMPREHENSIVE METABOLIC PANEL
ALT: 64 U/L — ABNORMAL HIGH (ref 0–44)
AST: 86 U/L — ABNORMAL HIGH (ref 15–41)
Albumin: 2.6 g/dL — ABNORMAL LOW (ref 3.5–5.0)
Alkaline Phosphatase: 82 U/L (ref 38–126)
Anion gap: 8 (ref 5–15)
BUN: 14 mg/dL (ref 8–23)
CHLORIDE: 103 mmol/L (ref 98–111)
CO2: 25 mmol/L (ref 22–32)
Calcium: 8.3 mg/dL — ABNORMAL LOW (ref 8.9–10.3)
Creatinine, Ser: 0.91 mg/dL (ref 0.44–1.00)
GFR calc Af Amer: >60 mL/min (ref >60)
GFR calc non Af Amer: 54 mL/min — ABNORMAL LOW (ref >60)
Glucose, Bld: 137 mg/dL — ABNORMAL HIGH (ref 70–99)
Potassium: 4.1 mmol/L (ref 3.5–5.1)
SODIUM: 136 mmol/L (ref 135–145)
Total Bilirubin: 0.6 mg/dL (ref 0.3–1.2)
Total Protein: 5.6 g/dL — ABNORMAL LOW (ref 6.5–8.1)

## 2018-12-22 LAB — MAGNESIUM: Magnesium: 1.8 mg/dL (ref 1.7–2.4)

## 2018-12-22 NOTE — Progress Notes (Signed)
[facility] Progress Note  Patient: [name] LSL:[id number]   PCP: Lajean Manes, MD DOB: 17-Dec-1922   DOA: 12/20/2018   DOS: 12/22/2018   Date of Service: the patient was seen and examined on 12/22/2018  Brief hospital course: Pt. with PMH of HTN, paroxysmal A. fib, depression, SIADH on demeclocycline, Takotsubo cardiomyopathy; admitted on 12/20/2018, presented with complaint of fall, was found to have right hip fracture. The patient underwent surgical correction on 12/21/2018. Currently further plan is monitor postoperative course.  Subjective: Continues to have severe pain in the left hip.  Did not receive any pain medication since last night.  No nausea no vomiting.  No chest pain abdominal pain.  No shortness of breath.  Assessment and Plan: 1. Right hip fracture mechanical fall S/P intramedullary nail implant. Tolerated the surgery. DVT prophylaxis by surgery. Weightbearing by surgery. Will require SNF on discharge.  2. Hypertension we will continue amlodipine and Coreg.  3. Paroxysmal atrial fibrillation on Coreg not on anticoagulation exact reason not known.  Rate controlled. Presently in sinus rhythm.  4. SIADH on demeclocycline. Follow metabolic panel.  5. History of Takotsubo's cardiomyopathy improved EF at this time with last 2D echo done in December 2019 showed EF of 55 to 60% with grade 1 diastolic dysfunction.   Appears compensated.   Hold Lasix for now and restart tomorrow  6.  Postoperative blood loss anemia. Normocytic normochromic anemia Monitor CBC. Transfer hemoglobin less than 8.  7.  Cardiac murmur. Recent echocardiogram showed mild left ear. Patient does have a significantly aortic murmur. Recommend outpatient follow-up with echocardiogram.  8.  Protein calorie malnutrition severe. Body mass index is 21.26 kg/m.  Nutrition Problem: Severe Malnutrition Etiology: social / environmental circumstances(advanced age with multiple  chronic illnesses) Interventions: Interventions: Ensure Enlive (each supplement provides 350kcal and 20 grams of protein), MVI  Diet: regular diet DVT Prophylaxis: subcutaneous Heparin  Advance goals of care discussion: DNR DNI  Family Communication: nofamily was present at bedside, at the time of interview.   Disposition:  Discharge to SNF.  Consultants: orthopedics  Procedures: intramedullary nail implant  Scheduled Meds: . acetaminophen  500 mg Oral Q6H  . carvedilol  12.5 mg Oral BID WC  . demeclocycline  150 mg Oral BID  . docusate sodium  100 mg Oral BID  . feeding supplement (ENSURE ENLIVE)  237 mL Oral BID BM  . multivitamin with minerals  1 tablet Oral Daily   Continuous Infusions: PRN Meds: acetaminophen, fentaNYL (SUBLIMAZE) injection, HYDROcodone-acetaminophen, HYDROcodone-acetaminophen, metoCLOPramide **OR** metoCLOPramide (REGLAN) injection, morphine injection, ondansetron **OR** ondansetron (ZOFRAN) IV, traMADol Antibiotics: Anti-infectives (From admission, onward)   Start     Dose/Rate Route Frequency Ordered Stop   12/22/18 0600  ceFAZolin (ANCEF) IVPB 2g/100 mL premix  Status:  Discontinued     2 g 200 mL/hr over 30 Minutes Intravenous On call to O.R. 12/21/18 1445 12/21/18 1831   12/21/18 1200  demeclocycline (DECLOMYCIN) tablet 150 mg     150 mg Oral 2 times daily 12/21/18 0213         Objective: Physical Exam: Vitals:   12/21/18 1805 12/22/18 0007 12/22/18 0425 12/22/18 0733  BP: (!) 113/50 (!) 141/50 107/73 116/62  Pulse: 67 73 75 77  Resp: 13 18 18 16   Temp:   98.6 F (37 C) 99 F (37.2 C)  TempSrc:   Oral Oral  SpO2: 100% 97% 100% 100%  Weight:      Height:        Intake/Output Summary (  Last 24 hours) at 12/22/2018 1409 Last data filed at 12/22/2018 1100 Gross per 24 hour  Intake 600 ml  Output 550 ml  Net 50 ml   Filed Weights   12/21/18 1446  Weight: 54.4 kg   General: Alert, Awake and Oriented to Time, Place and Person. Appear in  mild distress, affect appropriate Eyes: PERRL, Conjunctiva normal ENT: Oral Mucosa clear moist. Neck: no JVD, no Abnormal Mass Or lumps Cardiovascular: S1 and S2 Present, aortic systolic Murmur, Peripheral Pulses Present Respiratory: normal respiratory effort, Bilateral Air entry equal and Decreased, no use of accessory muscle, Clear to Auscultation, no Crackles, no wheezes Abdomen: Bowel Sound present, Soft and no tenderness, on hernia Skin: on redness, no Rash, on induration Extremities: noo Pedal edema, no calf tenderness Neurologic: Grossly no focal neuro deficit. Bilaterally Equal motor strength  Data Reviewed: CBC: Recent Labs  Lab 12/21/18 0019 12/22/18 0304  WBC 10.4 10.4  NEUTROABS 7.5 9.4*  HGB 11.3* 9.3*  HCT 35.8* 28.7*  MCV 93.2 93.8  PLT 319 702   Basic Metabolic Panel: Recent Labs  Lab 12/21/18 0019 12/22/18 0304  NA 136 136  K 5.0 4.1  CL 106 103  CO2 23 25  GLUCOSE 140* 137*  BUN 35* 14  CREATININE 1.21* 0.91  CALCIUM 8.9 8.3*  MG  --  1.8    Liver Function Tests: Recent Labs  Lab 12/22/18 0304  AST 86*  ALT 64*  ALKPHOS 82  BILITOT 0.6  PROT 5.6*  ALBUMIN 2.6*   No results for input(s): LIPASE, AMYLASE in the last 168 hours. No results for input(s): AMMONIA in the last 168 hours. Coagulation Profile: No results for input(s): INR, PROTIME in the last 168 hours. Cardiac Enzymes: No results for input(s): CKTOTAL, CKMB, CKMBINDEX, TROPONINI in the last 168 hours. BNP (last 3 results) Recent Labs    05/02/18 1514 09/19/18 1551  PROBNP 419 485   CBG: No results for input(s): GLUCAP in the last 168 hours. Studies: Dg C-arm 1-60 Min  Result Date: 12/21/2018 CLINICAL DATA:  Operative fixation of a right intertrochanteric fracture. EXAM: DG C-ARM 61-120 MIN; OPERATIVE RIGHT HIP WITH PELVIS COMPARISON:  Earlier today. FINDINGS: Interval rod and screw fixation of the previously demonstrated right intertrochanteric fracture with significantly  improved position and alignment. No significant displacement or angulation seen at this time. No new fractures. IMPRESSION: Hardware fixation of the previously demonstrated right intertrochanteric fracture with significantly improved position and alignment. Electronically Signed   By: Claudie Revering M.D.   On: 12/21/2018 17:37   Dg Hip Operative Unilat W Or W/o Pelvis Right  Result Date: 12/21/2018 CLINICAL DATA:  Operative fixation of a right intertrochanteric fracture. EXAM: DG C-ARM 61-120 MIN; OPERATIVE RIGHT HIP WITH PELVIS COMPARISON:  Earlier today. FINDINGS: Interval rod and screw fixation of the previously demonstrated right intertrochanteric fracture with significantly improved position and alignment. No significant displacement or angulation seen at this time. No new fractures. IMPRESSION: Hardware fixation of the previously demonstrated right intertrochanteric fracture with significantly improved position and alignment. Electronically Signed   By: Claudie Revering M.D.   On: 12/21/2018 17:37     Time spent: 35 minutes  Author: Berle Mull, MD [facility] 12/22/2018 2:09 PM  To reach On-call, see care teams to locate the attending and reach out to them via [email or web address]. If 7PM-7AM, please contact night-coverage If you still have difficulty reaching the attending provider, please page the [facility] (Director on Call) for [facility] on amion for assistance.

## 2018-12-22 NOTE — Evaluation (Signed)
Physical Therapy Evaluation Patient Details Name: [name] MRN: [id number] DOB: 07-02-1923 Today's Date: 12/22/2018   History of Present Illness   [name] is a 85 y.o.-year-old female who sustained a right hip fracture from fall, PMH Takotsubo's cardiomyopathy,  hypertension, paroxysmal atrial fibrillation not on anticoagulation, depression, SIADH on demeclocycline., s/p R femur IM nail 12/21/2018  Clinical Impression  PTA pt living in independent living at [facility], having family members bring her hot meals to her from the dining room. Pt is currently limited in safe mobility by R hip pain, and resultant reduced strength and ROM. Pt requires maxAx2 for bed mobility and transfers. Attempted ambulation however pt unable to off weight L LE for stepping. PT recommending pt receive SNF level rehab, with preference back to the [facility]. PT will continue to follow acutely.    Follow Up Recommendations SNF    Equipment Recommendations  Other (comment)(TBD at next venue)    Recommendations for Other Services       Precautions / Restrictions Precautions Precautions: None Restrictions Weight Bearing Restrictions: Yes RLE Weight Bearing: Weight bearing as tolerated      Mobility  Bed Mobility Overal bed mobility: Needs Assistance Bed Mobility: Supine to Sit     Supine to sit: Max assist;+2 for physical assistance;+2 for safety/equipment;HOB elevated     General bed mobility comments: Pt required Max A +2 to power up and manage LE to EOB due to weakness and pain. Pt instructed to bring LUE to R grab to assist with bed mobility.  Prior to transfer vital checked:  SpO2- 97, BP 127/44   Transfers Overall transfer level: Needs assistance Equipment used: 2 person hand held assist Transfers: Stand Pivot Transfers   Stand pivot transfers: Max assist;+2 physical assistance;+2 safety/equipment;From elevated surface       General transfer comment:  Inititally, therapist attempted transfer with Pt utilizing RW, then transitioned to 2 hand held assist stand pivot to recliner towards L side of body.         Balance Overall balance assessment: Needs assistance Sitting-balance support: Bilateral upper extremity supported Sitting balance-Leahy Scale: Poor     Standing balance support: Bilateral upper extremity supported Standing balance-Leahy Scale: Zero                               Pertinent Vitals/Pain Pain Assessment: Faces Faces Pain Scale: Hurts whole lot Pain Location: R hip Pain Descriptors / Indicators: Guarding;Discomfort;Aching Pain Intervention(s): Limited activity within patient's tolerance;Monitored during session;Repositioned    Home Living Family/patient expects to be discharged to:: Private residence Living Arrangements: Non-relatives/Friends Available Help at Discharge: Family;Friend(s) Type of Home: Apartment       Home Layout: One level Home Equipment: Environmental consultant - 2 wheels;Walker - 4 wheels;Cane - single point;Shower seat;Grab bars - tub/shower Additional Comments: Granddaughter reports pt required assistance with functional mobility due to decrease mobilization and not required much assist with self care tasks.     Prior Function Level of Independence: Needs assistance   Gait / Transfers Assistance Needed: Rollator or walker           Hand Dominance   Dominant Hand: Right    Extremity/Trunk Assessment   Upper Extremity Assessment Upper Extremity Assessment: Defer to OT evaluation    Lower Extremity Assessment Lower Extremity Assessment: RLE deficits/detail RLE Deficits / Details: R hip and knee ROM and strength limited by fx/sx RLE: Unable to fully assess due  to pain       Communication   Communication: HOH  Cognition Arousal/Alertness: Awake/alert Behavior During Therapy: WFL for tasks assessed/performed Overall Cognitive Status: Impaired/Different from baseline Area of  Impairment: Memory;Awareness;Orientation                 Orientation Level: Disoriented to;Place(pt mentions being in assisted living at the moment.)   Memory: Decreased short-term memory(Mentions being in her bed at home rather than hospital bed.)         General Comments: Pt required constant reminders of being in the hospital rather than residence.      General Comments General comments (skin integrity, edema, etc.): edema in R LE noted with removal of SCD, educated in LE movement to reduce swelling    Exercises     Assessment/Plan    PT Assessment Patient needs continued PT services  PT Problem List Decreased strength;Decreased range of motion;Decreased activity tolerance;Decreased balance;Decreased mobility;Decreased coordination;Decreased cognition;Pain       PT Treatment Interventions DME instruction;Gait training;Functional mobility training;Therapeutic activities;Therapeutic exercise;Balance training;Cognitive remediation;Patient/family education    PT Goals (Current goals can be found in the Care Plan section)  Acute Rehab PT Goals Patient Stated Goal: Return home PT Goal Formulation: With patient/family Time For Goal Achievement: 01/05/19 Potential to Achieve Goals: Fair    Frequency Min 3X/week        Co-evaluation PT/OT/SLP Co-Evaluation/Treatment: Yes Reason for Co-Treatment: Complexity of the patient's impairments (multi-system involvement) PT goals addressed during session: Mobility/safety with mobility         AM-PAC PT "6 Clicks" Mobility  Outcome Measure Help needed turning from your back to your side while in a flat bed without using bedrails?: Total Help needed moving from lying on your back to sitting on the side of a flat bed without using bedrails?: Total Help needed moving to and from a bed to a chair (including a wheelchair)?: Total Help needed standing up from a chair using your arms (e.g., wheelchair or bedside chair)?:  Total Help needed to walk in hospital room?: Total Help needed climbing 3-5 steps with a railing? : Total 6 Click Score: 6    End of Session Equipment Utilized During Treatment: Gait belt Activity Tolerance: Patient limited by pain Patient left: in chair;with call bell/phone within reach;with chair alarm set Nurse Communication: Mobility status;Weight bearing status PT Visit Diagnosis: Unsteadiness on feet (R26.81);Other abnormalities of gait and mobility (R26.89);Muscle weakness (generalized) (M62.81);History of falling (Z91.81);Repeated falls (R29.6);Difficulty in walking, not elsewhere classified (R26.2);Pain Pain - Right/Left: Right Pain - part of body: Hip    Time: 9242-6834 PT Time Calculation (min) (ACUTE ONLY): 48 min   Charges:   PT Evaluation $PT Eval Moderate Complexity: 1 Mod          Jazzmyne Rasnick B. Migdalia Dk PT, DPT Acute Rehabilitation Services Pager [phone] Office [phone]   [place] 12/22/2018, 1:53 PM

## 2018-12-22 NOTE — NC FL2 (Signed)
[place] LEVEL OF CARE SCREENING TOOL     IDENTIFICATION  Patient Name: [name] Birthdate: 08/02/1923 Sex: female Admission Date (Current Location): 12/20/2018  [facility] and [place] Number:  Herbalist and Address:  [facility], [place] [address] [place], [place] [address]      Provider Number: [id number]  Attending Physician Name and Address:  Lavina Hamman, MD  Relative Name and Phone Number:  Vaughan Basta (daughter) [phone]    Current Level of Care: Hospital Recommended Level of Care: [place] Prior Approval Number:    Date Approved/Denied:   PASRR Number: [id number]  Discharge Plan: SNF    Current Diagnoses: Patient Active Problem List   Diagnosis Date Noted  . Closed right hip fracture ([place]) 12/21/2018  . Closed right hip fracture, initial encounter ([place]) 12/21/2018  . Protein-calorie malnutrition, severe 12/21/2018  . Closed intertrochanteric fracture of hip, right, initial encounter ([place])   . Edema of extremities 04/12/2018  . Aortic stenosis, mild 03/03/2016  . MVP (mitral valve prolapse) 03/03/2016  . Bradycardia 08/28/2015  . Takotsubo cardiomyopathy   . Old MI (myocardial infarction)   . Back pain   . SIADH (syndrome of inappropriate ADH production) ([place])   . Ventricular tachycardia ([place])   . GI bleed due to NSAIDs   . Hyperlipidemia   . Persistent atrial fibrillation ([place])   . Umbilical hernia 27/78/2423  . S/P cholecystectomy 12/03/2011  . Polymyalgia rheumatica ([place]) 10/08/2011  . Essential hypertension, benign 10/08/2011  . Esophageal reflux 10/08/2011    Orientation RESPIRATION BLADDER Height & Weight     Self, Time, Situation, Place  O2(nasal cannula 2L/min) Continent Weight: 54.4 kg Height:  5\' 3"  (160 cm)  BEHAVIORAL SYMPTOMS/MOOD NEUROLOGICAL BOWEL NUTRITION STATUS      Continent Diet(see discharge summary)  AMBULATORY STATUS COMMUNICATION OF NEEDS Skin    Extensive Assist Verbally Surgical wounds(right leg closed surgical incision)                       Personal Care Assistance Level of Assistance  Bathing, Feeding, Dressing, Total care Bathing Assistance: Limited assistance Feeding assistance: Limited assistance Dressing Assistance: Limited assistance Total Care Assistance: Maximum assistance   Functional Limitations Info  Sight, Hearing, Speech Sight Info: Adequate Hearing Info: Adequate Speech Info: Adequate    SPECIAL CARE FACTORS FREQUENCY  PT (By licensed PT), OT (By licensed OT)     PT Frequency: min 5x weekly OT Frequency: min 5x weekly            Contractures Contractures Info: Not present    Additional Factors Info  Code Status, Allergies Code Status Info: DNR Allergies Info: Amlodipine, Avelox (moxifloxacin Hcl In Nacl), Duragesic Disc transdermal System (fentanyl), Morphine and related, Teriparatide (recombinant), Lidocaine           Current Medications (12/22/2018):  This is the current hospital active medication list Current Facility-Administered Medications  Medication Dose Route Frequency Provider Last Rate Last Dose  . acetaminophen (TYLENOL) tablet 325-650 mg  325-650 mg Oral Q6H PRN Nicholes Stairs, MD      . acetaminophen (TYLENOL) tablet 500 mg  500 mg Oral Q6H Nicholes Stairs, MD   500 mg at 12/22/18 0513  . carvedilol (COREG) tablet 12.5 mg  12.5 mg Oral BID WC Nicholes Stairs, MD   12.5 mg at 12/22/18 5361  . demeclocycline (DECLOMYCIN) tablet 150 mg  150 mg Oral BID Nicholes Stairs,  MD   150 mg at 12/22/18 0853  . docusate sodium (COLACE) capsule 100 mg  100 mg Oral BID Nicholes Stairs, MD   100 mg at 12/22/18 5465  . feeding supplement (ENSURE ENLIVE) (ENSURE ENLIVE) liquid 237 mL  237 mL Oral BID BM Nicholes Stairs, MD   237 mL at 12/22/18 1439  . fentaNYL (SUBLIMAZE) injection 25 mcg  25 mcg Intravenous Q2H PRN Nicholes Stairs, MD   25 mcg at  12/22/18 [phone]  . HYDROcodone-acetaminophen (NORCO) 7.5-325 MG per tablet 1-2 tablet  1-2 tablet Oral Q4H PRN Nicholes Stairs, MD   2 tablet at 12/22/18 1327  . HYDROcodone-acetaminophen (NORCO/VICODIN) 5-325 MG per tablet 1-2 tablet  1-2 tablet Oral Q4H PRN Nicholes Stairs, MD      . metoCLOPramide [facility]) tablet 5-10 mg  5-10 mg Oral Q8H PRN Nicholes Stairs, MD       Or  . metoCLOPramide [facility]) injection 5-10 mg  5-10 mg Intravenous Q8H PRN Nicholes Stairs, MD      . morphine 2 MG/ML injection 0.5-1 mg  0.5-1 mg Intravenous Q2H PRN Nicholes Stairs, MD      . multivitamin with minerals tablet 1 tablet  1 tablet Oral Daily Nicholes Stairs, MD   1 tablet at 12/22/18 [phone]  . ondansetron (ZOFRAN) tablet 4 mg  4 mg Oral Q6H PRN Nicholes Stairs, MD       Or  . ondansetron [facility]) injection 4 mg  4 mg Intravenous Q6H PRN Nicholes Stairs, MD      . traMADol [facility]) tablet 50 mg  50 mg Oral Q6H PRN Nicholes Stairs, MD         Discharge Medications: Please see discharge summary for a list of discharge medications.  Relevant Imaging Results:  Relevant Lab Results:   Additional Information SSN: [id number]  Alberteen Sam, LCSW

## 2018-12-22 NOTE — Progress Notes (Signed)
Orthopedic Tech Progress Note Patient Details:  [name] [birth year] [id number]  Patient ID: [name], female   DOB: 1923/10/18, 85 y.o.   MRN: [id number] RN said pt could not use ohf and not to put it on the bed.  Karolee Stamps 12/22/2018, 4:10 PM

## 2018-12-22 NOTE — Progress Notes (Signed)
Occupational Therapy Treatment Patient Details Name: [name] MRN: [id number] DOB: 08/11/1923 Today's Date: 12/22/2018    History of present illness  [name] is a 85 y.o.-year-old female who sustained a right hip fracture from fall, PMH Takotsubo's cardiomyopathy,  hypertension, paroxysmal atrial fibrillation not on anticoagulation, depression, SIADH on demeclocycline.   OT comments  PTA pt PLOF Mod I in all ADLs with increased time, however, required assistance with functional mobility as reported by granddaugter at bedside. Pt currently requires Max A LB ADLs, and Max A+2 functional transfers due to decreased weakness, pain, and limited function. Pt will benefit from continued skilled acute OT to transfer to skilled rehab OT to increase safety and maximize independence for home setting. Pt granddaughter encouraged to motivate patient to be active and engage in ADLs. DC recommendation for SNF and supervision for safety and cognitive deficits. OT will continue to follow acutely.     SNF;Supervision/Assistance - 24 hour    Equipment Recommendations  3 in 1 bedside commode    Recommendations for Other Services      Precautions / Restrictions Precautions Precautions: None Restrictions Weight Bearing Restrictions: Yes RLE Weight Bearing: Weight bearing as tolerated       Mobility Bed Mobility Overal bed mobility: Needs Assistance Bed Mobility: Supine to Sit     Supine to sit: Max assist;+2 for physical assistance;+2 for safety/equipment;HOB elevated     General bed mobility comments: Pt required Max A +2 to power up and manage LE to EOB due to weakness and pain. Pt instructed to bring LUE to R grab to assist with bed mobility.  Prior to transfer vital checked:  SpO2- 97, BP 127/44   Transfers Overall transfer level: Needs assistance Equipment used: 2 person hand held assist Transfers: Stand Pivot Transfers   Stand pivot transfers: Max assist;+2 physical  assistance;+2 safety/equipment;From elevated surface       General transfer comment: Inititally, therapist attempted transfer with Pt utilizing RW, then transitioned to 2 hand held assist stand pivot to recliner towards L side of body.     Balance Overall balance assessment: Needs assistance Sitting-balance support: Bilateral upper extremity supported Sitting balance-Leahy Scale: Poor     Standing balance support: Bilateral upper extremity supported Standing balance-Leahy Scale: Zero                             ADL either performed or assessed with clinical judgement   ADL Overall ADL's : Needs assistance/impaired Eating/Feeding: Set up;Sitting   Grooming: Set up;Supervision/safety;Sitting       Lower Body Bathing: Maximal assistance       Lower Body Dressing: Maximal assistance;Cueing for safety;Cueing for sequencing;Sitting/lateral leans   Toilet Transfer: Maximal assistance;+2 for physical assistance;+2 for safety/equipment;Stand-pivot;Cueing for safety;Cueing for sequencing;RW           Functional mobility during ADLs: Maximal assistance;+2 for physical assistance;+2 for safety/equipment;Cueing for safety;Cueing for sequencing;Rolling walker General ADL Comments: Pt unable to bring LLE up to figure position for donning of sock.      Vision       Perception     Praxis      Cognition Arousal/Alertness: Awake/alert Behavior During Therapy: WFL for tasks assessed/performed Overall Cognitive Status: Impaired/Different from baseline Area of Impairment: Memory;Awareness;Orientation                 Orientation Level: Disoriented to;Place(pt mentions being in assisted living at the moment.)   Memory: Decreased short-term  memory(Mentions being in her bed at home rather than hospital bed.)         General Comments: Pt required constant reminders of being in the hospital rather than residence.        Exercises     Shoulder Instructions        General Comments edema observed of RLE while in bed.    Pertinent Vitals/ Pain       Pain Assessment: Faces Faces Pain Scale: Hurts whole lot Pain Location: R hip Pain Descriptors / Indicators: Guarding;Discomfort;Aching Pain Intervention(s): Repositioned;Monitored during session;Limited activity within patient's tolerance  Home Living Family/patient expects to be discharged to:: Private residence Living Arrangements: Non-relatives/Friends Available Help at Discharge: Family;Friend(s) Type of Home: Apartment       Home Layout: One level     Bathroom Shower/Tub: Teacher, early years/pre: Handicapped height     Home Equipment: Environmental consultant - 2 wheels;Walker - 4 wheels;Cane - single point;Shower seat;Grab bars - tub/shower   Additional Comments: Granddaughter reports pt required assistance with functional mobility due to decrease mobilization and not required much assist with self care tasks.       Prior Functioning/Environment Level of Independence: Needs assistance  Gait / Transfers Assistance Needed: Rollator or walker         Frequency  Min 2X/week        Progress Toward Goals  OT Goals(current goals can now be found in the care plan section)     Acute Rehab OT Goals Patient Stated Goal: Return home OT Goal Formulation: With patient/family Time For Goal Achievement: 01/05/19 Potential to Achieve Goals: Fair  Plan      Co-evaluation    PT/OT/SLP Co-Evaluation/Treatment: Yes Reason for Co-Treatment: Complexity of the patient's impairments (multi-system involvement);For patient/therapist safety;Necessary to address cognition/behavior during functional activity   OT goals addressed during session: ADL's and self-care;Proper use of Adaptive equipment and DME      AM-PAC OT "6 Clicks" Daily Activity     Outcome Measure   Help from another person eating meals?: None Help from another person taking care of personal grooming?: A Little Help  from another person toileting, which includes using toliet, bedpan, or urinal?: A Lot Help from another person bathing (including washing, rinsing, drying)?: A Lot Help from another person to put on and taking off regular upper body clothing?: A Little Help from another person to put on and taking off regular lower body clothing?: A Lot 6 Click Score: 16    End of Session Equipment Utilized During Treatment: Rolling walker;Gait belt  OT Visit Diagnosis: Unsteadiness on feet (R26.81);History of falling (Z91.81);Muscle weakness (generalized) (M62.81);Pain Pain - Right/Left: Right Pain - part of body: Hip   Activity Tolerance Patient limited by pain   Patient Left in chair;with call bell/phone within reach;with chair alarm set;with family/visitor present   Nurse Communication Mobility status;Weight bearing status        Time: 3614-4315 OT Time Calculation (min): 48 min  Charges: OT General Charges $OT Visit: 1 Visit OT Evaluation $OT Eval Moderate Complexity: 1 Mod OT Treatments $Self Care/Home Management : 8-22 mins  Minus Breeding, MSOT, OTR/L  Supplemental Rehabilitation Services  [phone]   Marius Ditch 12/22/2018, 11:19 AM

## 2018-12-23 ENCOUNTER — Inpatient Hospital Stay (HOSPITAL_COMMUNITY): Payer: Medicare Other

## 2018-12-23 DIAGNOSIS — S92534A Nondisplaced fracture of distal phalanx of right lesser toe(s), initial encounter for closed fracture: Secondary | ICD-10-CM

## 2018-12-23 DIAGNOSIS — S72001D Fracture of unspecified part of neck of right femur, subsequent encounter for closed fracture with routine healing: Secondary | ICD-10-CM

## 2018-12-23 LAB — CBC WITH DIFFERENTIAL/PLATELET
Abs Immature Granulocytes: 0.06 10*3/uL (ref 0.00–0.07)
BASOS ABS: 0 10*3/uL (ref 0.0–0.1)
BASOS PCT: 0 %
Eosinophils Absolute: 0.1 10*3/uL (ref 0.0–0.5)
Eosinophils Relative: 1 %
HCT: 25.4 % — ABNORMAL LOW (ref 36.0–46.0)
Hemoglobin: 8 g/dL — ABNORMAL LOW (ref 12.0–15.0)
Immature Granulocytes: 1 %
Lymphocytes Relative: 17 %
Lymphs Abs: 1.9 10*3/uL (ref 0.7–4.0)
MCH: 29.5 pg (ref 26.0–34.0)
MCHC: 31.5 g/dL (ref 30.0–36.0)
MCV: 93.7 fL (ref 80.0–100.0)
Monocytes Absolute: 1.5 10*3/uL — ABNORMAL HIGH (ref 0.1–1.0)
Monocytes Relative: 14 %
Neutro Abs: 7.3 10*3/uL (ref 1.7–7.7)
Neutrophils Relative %: 67 %
PLATELETS: 189 10*3/uL (ref 150–400)
RBC: 2.71 MIL/uL — AB (ref 3.87–5.11)
RDW: 13.8 % (ref 11.5–15.5)
WBC: 10.9 10*3/uL — AB (ref 4.0–10.5)
nRBC: 0 % (ref 0.0–0.2)

## 2018-12-23 LAB — COMPREHENSIVE METABOLIC PANEL
ALT: 37 U/L (ref 0–44)
AST: 53 U/L — ABNORMAL HIGH (ref 15–41)
Albumin: 2.3 g/dL — ABNORMAL LOW (ref 3.5–5.0)
Alkaline Phosphatase: 69 U/L (ref 38–126)
Anion gap: 5 (ref 5–15)
BUN: 20 mg/dL (ref 8–23)
CALCIUM: 8.4 mg/dL — AB (ref 8.9–10.3)
CO2: 25 mmol/L (ref 22–32)
Chloride: 102 mmol/L (ref 98–111)
Creatinine, Ser: 1.37 mg/dL — ABNORMAL HIGH (ref 0.44–1.00)
GFR calc Af Amer: 38 mL/min — ABNORMAL LOW (ref >60)
GFR calc non Af Amer: 33 mL/min — ABNORMAL LOW (ref >60)
Glucose, Bld: 110 mg/dL — ABNORMAL HIGH (ref 70–99)
Potassium: 4.2 mmol/L (ref 3.5–5.1)
Sodium: 132 mmol/L — ABNORMAL LOW (ref 135–145)
Total Bilirubin: 0.7 mg/dL (ref 0.3–1.2)
Total Protein: 5.4 g/dL — ABNORMAL LOW (ref 6.5–8.1)

## 2018-12-23 MED ORDER — ENSURE ENLIVE PO LIQD: 237.0000 mL | Freq: Two times a day (BID) | ORAL | 12 refills | Status: DC

## 2018-12-23 MED ORDER — ONDANSETRON HCL 4 MG PO TABS: 4.0000 mg | ORAL_TABLET | Freq: Four times a day (QID) | ORAL | 0 refills | Status: DC | PRN

## 2018-12-23 NOTE — Progress Notes (Signed)
CSW was notified by [facility] they do not have a bed avail in skilled nursing however [facility] is able to provide bed for patient upon discharge.   Insurance auth with [employer] will be initiated. PT please continue to follow and update PT notes regarding need for skilled nursing.   [place], [employer]

## 2018-12-23 NOTE — Progress Notes (Signed)
Pt assisted to [facility] with NT. Surgical shoe in place per MD order  Pt has unsteady gait, but able to follow commands. No acute distress, was able to void as soon as she got to [place]. Is attempting to have a BM at this time. Family at bedside, continue to monitor.

## 2018-12-23 NOTE — Progress Notes (Signed)
I have reviewed the x-ray findings of the right t she does appear to have a second third anrth metatarsal neck fractures.  These are mostly nondisplaced and will be amenable to nonoperative tre can weight-bear as tolerated with a postop shoe.  She may remove the shoe for daily hygiene and sleeping.

## 2018-12-23 NOTE — Discharge Summary (Signed)
Discharge Summary  MERIBETH VITUG AYT:[id number] DOB: 07-14-1923  PCP: Lajean Manes, MD  Admit date: 12/20/2018 Discharge date: 12/23/2018  Time spent: 30 minutes  Recommendations for Outpatient Follow-up:  1. Orthopedic  Discharge Diagnoses:  Active Hospital Problems   Diagnosis Date Noted  . Closed right hip fracture ([place]) 12/21/2018  . Nondisplaced fracture of distal phalanx of right lesser toe(s), initial encounter for closed fracture 12/23/2018  . Closed right hip fracture, initial encounter ([place]) 12/21/2018  . Protein-calorie malnutrition, severe 12/21/2018  . MVP (mitral valve prolapse) 03/03/2016  . Takotsubo cardiomyopathy   . SIADH (syndrome of inappropriate ADH production) ([place])   . Persistent atrial fibrillation ([place])   . Essential hypertension, benign 10/08/2011    Resolved Hospital Problems  No resolved problems to display.    Discharge Condition: Improved  Diet recommendation: Healthy heart with Ensure supplement  Vitals:   12/22/18 2116 12/23/18 0352  BP: (!) 135/41 (!) 132/45  Pulse: 72 71  Resp: 18 18  Temp: 97.9 F (36.6 C) 97.8 F (36.6 C)  SpO2: 96% 96%    History of present illness:   [name] is a 85 y.o. female with history of Takotsubo's cardiomyopathy which has improved last EF measured was in December 2019 EF of 55 to 60% with grade 1 diastolic dysfunction on Lasix, hypertension, paroxysmal atrial fibrillation not on anticoagulation, depression, SIADH on demeclocycline had a fall at her living facility when she was trying to turn.  Had hit her head and has a small hematoma.  Denies losing consciousness.  Denies any chest pain or shortness of breath or palpitations.  She was found to have fracture of the right hip and taken to the OR for intramedullary implant.  Also she continued to complain of right foot pain x-ray was done and it shows  nondisplaced fractures of second, third and fourth metatarsal necks.  Dr. Victorino December orthopedic, is aware of the fracture and advised for her to wear orthopedic shoes and follow-up with him as outpatient  Hospital Course:  Principal Problem:   Closed right hip fracture [facility]) Active Problems:   Essential hypertension, benign   Takotsubo cardiomyopathy   SIADH (syndrome of inappropriate ADH production) (H[facility])   Persistent atrial fibrillation ([facility])   MVP (mitral valve prolapse)   Closed right hip fracture, initial encounter ([place])   Protein-calorie malnutrition, severe   Nondisplaced fracture of distal phalanx of right lesser toe(s), initial encounter for closed fracture Patient underwent intraoperative peritrochanteric intramedullary implant she tolerated the procedure well. Per orthopedic below is the plan of care. : The patient will be weight bearing as tolerated and will return in 2 weeks for staple removal and the patient will receive DVT prophylaxis based on other medications, activity level, and risk ratio of bleeding to thrombosis.  She will be on bid 81 mg asa for 6 weeks for DVT PPx.   Procedures:   Treatment of intertrochanteric, pertrochanteric, subtrochanteric fracture with intramedullary implant. CPT [phone]    Consultations:  Orthopedic Dr. Victorino December  Physical therapy  Occupational Therapy  Discharge Exam: BP (!) 132/45 (BP Location: Left Arm)   Pulse 71   Temp 97.8 F (36.6 C) (Oral)   Resp 18   Ht 5\' 3"  (1.6 m)   Wt 54.4 kg   SpO2 96%   BMI 21.26 kg/m   General: Alert oriented x3 elderly female no distress Cardiovascular: Regular rate and rhythm Respiratory: Work of breathing is normal no rhonchi Lower extremity right hip has  dressing no swelling.  She has also swelling and pain to the right toes which she stated has been going on since her fall on the day of admission December 21, 2018.  There is tenderness to palpation.  Discharge Instructions You were cared for by a hospitalist during your hospital stay. If you have any questions  about your discharge medications or the care you received while you were in the hospital after you are discharged, you can call the unit and asked to speak with the hospitalist on call if the hospitalist that took care of you is not available. Once you are discharged, your primary care physician will handle any further medical issues. Please note that NO REFILLS for any discharge medications will be authorized once you are discharged, as it is imperative that you return to your primary care physician (or establish a relationship with a primary care physician if you do not have one) for your aftercare needs so that they can reassess your need for medications and monitor your lab values.  Discharge Instructions    Call MD for:  persistant nausea and vomiting   Complete by:  As directed    Call MD for:  severe uncontrolled pain   Complete by:  As directed    Call MD for:  temperature >100.4   Complete by:  As directed    Diet - low sodium heart healthy   Complete by:  As directed    Discharge instructions   Complete by:  As directed    Follow-up with orthopedic   Increase activity slowly   Complete by:  As directed    Full weightbearing per orthopedic, also use postop orthopedic shoe for the right foot fracture     Allergies as of 12/23/2018      Reactions   Amlodipine Swelling   To feet and ankles    Avelox [moxifloxacin Hcl In Nacl] Other (See Comments)   Pt does not remember   Duragesic Disc Transdermal System [fentanyl] Other (See Comments)   Reaction unknown   Morphine And Related Other (See Comments)   "My Sister and daughter can,t take it. I've had it before without problems."   Teriparatide (recombinant) Other (See Comments)   Made skin dry   Lidocaine Rash      Medication List    TAKE these medications   acetaminophen 500 MG tablet Commonly known as:  TYLENOL Take 500 mg by mouth every 6 (six) hours as needed for mild pain, fever or headache.   amLODipine 2.5 MG  tablet Commonly known as:  NORVASC TAKE 1 TABLET BY MOUTH W/ 5MG  TAB DAILY What changed:  See the new instructions.   amLODipine 5 MG tablet Commonly known as:  NORVASC TAKE 1 TABLET BY MOUTH EVERY DAY What changed:    how much to take  additional instructions   azelastine 0.1 % nasal spray Commonly known as:  ASTELIN Place 2 sprays into both nostrils 2 (two) times daily as needed for allergies.   buPROPion 150 MG 12 hr tablet Commonly known as:  WELLBUTRIN SR Take 150 mg by mouth every morning.   carvedilol 12.5 MG tablet Commonly known as:  COREG Take 1 tablet (12.5 mg total) by mouth 2 (two) times daily with a meal.   CENTRUM CARDIO PO Take 1 tablet by mouth 2 (two) times daily.   cetirizine 10 MG tablet Commonly known as:  ZYRTEC Take 10 mg by mouth daily.   demeclocycline 150 MG tablet Commonly known as:  DECLOMYCIN Take 150 mg by mouth 2 (two) times daily.   diclofenac sodium 1 % Gel Commonly known as:  VOLTAREN Apply 1 application topically 4 (four) times daily as needed (pain).   feeding supplement (ENSURE ENLIVE) Liqd Take 237 mLs by mouth 2 (two) times daily between meals.   fish oil-omega-3 fatty acids 1000 MG capsule Take 1 g by mouth daily.   furosemide 20 MG tablet Commonly known as:  LASIX Take 1 tablet (20 mg total) by mouth every other day. What changed:    when to take this  reasons to take this   ibuprofen 200 MG tablet Commonly known as:  ADVIL,MOTRIN Take 200 mg by mouth every 8 (eight) hours as needed for moderate pain.   ondansetron 4 MG tablet Commonly known as:  ZOFRAN Take 1 tablet (4 mg total) by mouth every 6 (six) hours as needed for nausea.   polyethylene glycol packet Commonly known as:  MIRALAX / GLYCOLAX Take 17 g by mouth daily as needed for mild constipation.   traMADol 300 MG 24 hr tablet Commonly known as:  ULTRAM-ER Take 300 mg by mouth daily as needed for pain.   ZANTAC PO Take 1 tablet by mouth daily.       Allergies  Allergen Reactions  . Amlodipine Swelling    To feet and ankles   . Avelox [Moxifloxacin Hcl In Nacl] Other (See Comments)    Pt does not remember   . Duragesic Disc Transdermal System [Fentanyl] Other (See Comments)    Reaction unknown  . Morphine And Related Other (See Comments)    "My Sister and daughter can,t take it. I've had it before without problems."  . Teriparatide (Recombinant) Other (See Comments)    Made skin dry  . Lidocaine Rash   Follow-up Information    Nicholes Stairs, MD In 2 weeks.   Specialty:  Orthopedic Surgery Why:  For suture removal, For wound re-check Contact information: [address] [place] [address] [phone]            The results of significant diagnostics from this hospitalization (including imaging, microbiology, ancillary and laboratory) are listed below for reference.    Significant Diagnostic Studies: Dg Chest 1 View  Result Date: 12/21/2018 CLINICAL DATA:  Leg pain EXAM: CHEST  1 VIEW COMPARISON:  07/20/2014 FINDINGS: No acute consolidation or effusion. Mild cardiomegaly with aortic atherosclerosis. No pneumothorax. IMPRESSION: No active disease.  Mild cardiomegaly Electronically Signed   By: Donavan Foil M.D.   On: 12/21/2018 01:25   Dg Lumbar Spine Complete  Result Date: 12/21/2018 CLINICAL DATA:  Leg pain EXAM: LUMBAR SPINE - COMPLETE 4+ VIEW COMPARISON:  MRI 01/02/2007 FINDINGS: Dense aortic atherosclerosis. Study limited by scoliosis and osteopenia. Multiple treated compression fractures at L3, T12 and L1 with additional treated compression deformities at the lower thoracic spine. Moderate severe compression fracture T11, chronic. Advanced degenerative change at L4-L5 and L5-S1. Moderate severe degenerative change L3-L4. IMPRESSION: 1. Limited study secondary to scoliosis and osteopenia. 2. Multiple treated compression fractures of the thoracolumbar spine with multiple level degenerative  changes, most marked at L3 through S1. No gross acute osseous abnormality. Electronically Signed   By: Donavan Foil M.D.   On: 12/21/2018 01:30   Ct Head Wo Contrast  Result Date: 12/21/2018 CLINICAL DATA:  Fall with head trauma EXAM: CT HEAD WITHOUT CONTRAST CT CERVICAL SPINE WITHOUT CONTRAST TECHNIQUE: Multidetector CT imaging of the head and cervical spine was performed following the standard  protocol without intravenous contrast. Multiplanar CT image reconstructions of the cervical spine were also generated. COMPARISON:  Head CT 11/14/2005 FINDINGS: CT HEAD FINDINGS Brain: There is no mass, hemorrhage or extra-axial collection. There is generalized atrophy without lobar predilection. Areas of hypoattenuation of the deep gray nuclei and confluent periventricular white matter hypodensity, consistent with chronic small vessel disease. Old left basal ganglia lacunar infarct. Vascular: Atherosclerotic calcification of the internal carotid arteries at the skull base. No abnormal hyperdensity of the major intracranial arteries or dural venous sinuses. Skull: Right frontoparietal scalp hematoma.  No skull fracture. Sinuses/Orbits: No fluid levels or advanced mucosal thickening of the visualized paranasal sinuses. No mastoid or middle ear effusion. The orbits are normal. CT CERVICAL SPINE FINDINGS Alignment: Grade 1 anterolisthesis at C3-4 and C4-5, likely secondary to facet arthrosis. Skull base and vertebrae: No acute fracture. Soft tissues and spinal canal: No prevertebral fluid or swelling. No visible canal hematoma. Disc levels: There is severe overgrowth at the right atlantoaxial joint with a large anterior osteophyte. Hypertrophy and calcific fragments surround the odontoid process. There is multilevel severe facet hypertrophy. No bony spinal canal stenosis. Severe left C3-4, left C4-5, right C5-6 neural foraminal stenosis. Upper chest: No pneumothorax, pulmonary nodule or pleural effusion. Other: Normal  visualized paraspinal cervical soft tissues. IMPRESSION: 1. No acute intracranial abnormality. Findings of chronic small vessel disease and generalized atrophy. 2. Right frontoparietal scalp hematoma without skull fracture. 3. No acute fracture of the cervical spine. 4. Severe chronic atlantoaxial degenerative change. 5. Multilevel severe facet arthrosis with associated foraminal stenosis. Electronically Signed   By: Ulyses Jarred M.D.   On: 12/21/2018 01:19   Ct Cervical Spine Wo Contrast  Result Date: 12/21/2018 CLINICAL DATA:  Fall with head trauma EXAM: CT HEAD WITHOUT CONTRAST CT CERVICAL SPINE WITHOUT CONTRAST TECHNIQUE: Multidetector CT imaging of the head and cervical spine was performed following the standard protocol without intravenous contrast. Multiplanar CT image reconstructions of the cervical spine were also generated. COMPARISON:  Head CT 11/14/2005 FINDINGS: CT HEAD FINDINGS Brain: There is no mass, hemorrhage or extra-axial collection. There is generalized atrophy without lobar predilection. Areas of hypoattenuation of the deep gray nuclei and confluent periventricular white matter hypodensity, consistent with chronic small vessel disease. Old left basal ganglia lacunar infarct. Vascular: Atherosclerotic calcification of the internal carotid arteries at the skull base. No abnormal hyperdensity of the major intracranial arteries or dural venous sinuses. Skull: Right frontoparietal scalp hematoma.  No skull fracture. Sinuses/Orbits: No fluid levels or advanced mucosal thickening of the visualized paranasal sinuses. No mastoid or middle ear effusion. The orbits are normal. CT CERVICAL SPINE FINDINGS Alignment: Grade 1 anterolisthesis at C3-4 and C4-5, likely secondary to facet arthrosis. Skull base and vertebrae: No acute fracture. Soft tissues and spinal canal: No prevertebral fluid or swelling. No visible canal hematoma. Disc levels: There is severe overgrowth at the right atlantoaxial joint  with a large anterior osteophyte. Hypertrophy and calcific fragments surround the odontoid process. There is multilevel severe facet hypertrophy. No bony spinal canal stenosis. Severe left C3-4, left C4-5, right C5-6 neural foraminal stenosis. Upper chest: No pneumothorax, pulmonary nodule or pleural effusion. Other: Normal visualized paraspinal cervical soft tissues. IMPRESSION: 1. No acute intracranial abnormality. Findings of chronic small vessel disease and generalized atrophy. 2. Right frontoparietal scalp hematoma without skull fracture. 3. No acute fracture of the cervical spine. 4. Severe chronic atlantoaxial degenerative change. 5. Multilevel severe facet arthrosis with associated foraminal stenosis. Electronically Signed   By: Lennette Bihari  [place] M.D.   On: 12/21/2018 01:19   Dg Foot 2 Views Right  Result Date: 12/23/2018 CLINICAL DATA:  Per patient fall about 1 week ago, reports dorsal swelling and bruising. EXAM: RIGHT FOOT - 2 VIEW COMPARISON:  None. FINDINGS: Bones appear radiolucent. There is question of nondisplaced fractures of the second, third, and fourth metatarsal necks. Detail is limited given the osteopenia. Consider oblique view to complete the series. There is atherosclerotic calcification of the small vessels of the feet. IMPRESSION: Possible nondisplaced fractures of the second, third, and fourth metatarsal necks. Consider oblique view to complete the series. Electronically Signed   By: Nolon Nations M.D.   On: 12/23/2018 11:44   Dg C-arm 1-60 Min  Result Date: 12/21/2018 CLINICAL DATA:  Operative fixation of a right intertrochanteric fracture. EXAM: DG C-ARM 61-120 MIN; OPERATIVE RIGHT HIP WITH PELVIS COMPARISON:  Earlier today. FINDINGS: Interval rod and screw fixation of the previously demonstrated right intertrochanteric fracture with significantly improved position and alignment. No significant displacement or angulation seen at this time. No new fractures. IMPRESSION: Hardware  fixation of the previously demonstrated right intertrochanteric fracture with significantly improved position and alignment. Electronically Signed   By: Claudie Revering M.D.   On: 12/21/2018 17:37   Dg Hip Operative Unilat W Or W/o Pelvis Right  Result Date: 12/21/2018 CLINICAL DATA:  Operative fixation of a right intertrochanteric fracture. EXAM: DG C-ARM 61-120 MIN; OPERATIVE RIGHT HIP WITH PELVIS COMPARISON:  Earlier today. FINDINGS: Interval rod and screw fixation of the previously demonstrated right intertrochanteric fracture with significantly improved position and alignment. No significant displacement or angulation seen at this time. No new fractures. IMPRESSION: Hardware fixation of the previously demonstrated right intertrochanteric fracture with significantly improved position and alignment. Electronically Signed   By: Claudie Revering M.D.   On: 12/21/2018 17:37   Dg Hip Unilat W Or Wo Pelvis 2-3 Views Right  Result Date: 12/21/2018 CLINICAL DATA:  Leg pain EXAM: DG HIP (WITH OR WITHOUT PELVIS) 2-3V RIGHT COMPARISON:  None. FINDINGS: SI joints are non widened. Pubic symphysis appears intact. Probable chronic fracture deformity of the left inferior pubic ramus. Acute comminuted displaced right intertrochanteric fracture. Right femoral head projects in joint. The shaft of the femur is migrated superiorly. There are vascular calcifications. IMPRESSION: 1. Acute comminuted and displaced right intertrochanteric fracture 2. Probable chronic fracture of the left inferior pubic ramus Electronically Signed   By: Donavan Foil M.D.   On: 12/21/2018 01:26    Microbiology: Recent Results (from the past 240 hour(s))  MRSA PCR Screening     Status: None   Collection Time: 12/21/18  1:00 PM  Result Value Ref Range Status   MRSA by PCR NEGATIVE NEGATIVE Final    Comment:        The GeneXpert MRSA Assay (FDA approved for NASAL specimens only), is one component of a comprehensive MRSA  colonization surveillance program. It is not intended to diagnose MRSA infection nor to guide or monitor treatment for MRSA infections. Performed at [facility], [place] [address] [place], [place] [address]      Labs: Basic Metabolic Panel: Recent Labs  Lab 12/21/18 0019 12/22/18 0304 12/23/18 0207  NA 136 136 132*  K 5.0 4.1 4.2  CL 106 103 102  CO2 23 25 25   GLUCOSE 140* 137* 110*  BUN 35* 14 20  CREATININE 1.21* 0.91 1.37*  CALCIUM 8.9 8.3* 8.4*  MG  --  1.8  --    Liver Function  Tests: Recent Labs  Lab 12/22/18 0304 12/23/18 0207  AST 86* 53*  ALT 64* 37  ALKPHOS 82 69  BILITOT 0.6 0.7  PROT 5.6* 5.4*  ALBUMIN 2.6* 2.3*   No results for input(s): LIPASE, AMYLASE in the last 168 hours. No results for input(s): AMMONIA in the last 168 hours. CBC: Recent Labs  Lab 12/21/18 0019 12/22/18 0304 12/23/18 0207  WBC 10.4 10.4 10.9*  NEUTROABS 7.5 9.4* 7.3  HGB 11.3* 9.3* 8.0*  HCT 35.8* 28.7* 25.4*  MCV 93.2 93.8 93.7  PLT 319 234 189   Cardiac Enzymes: No results for input(s): CKTOTAL, CKMB, CKMBINDEX, TROPONINI in the last 168 hours. BNP: BNP (last 3 results) No results for input(s): BNP in the last 8760 hours.  ProBNP (last 3 results) Recent Labs    05/02/18 1514 09/19/18 1551  PROBNP 419 485    CBG: No results for input(s): GLUCAP in the last 168 hours.     Signed:  Cristal Deer, MD [facility] 12/23/2018, 1:24 PM

## 2018-12-23 NOTE — Progress Notes (Signed)
   Subjective:  Patient reports pain as mild.  Denies any shortness of breath or chest pain.  Denies chills or night sweats.  She does have some pain in the right foot and right hip.  Objective:   VITALS:   Vitals:   12/22/18 0733 12/22/18 1417 12/22/18 2116 12/23/18 0352  BP: 116/62 (!) 112/47 (!) 135/41 (!) 132/45  Pulse: 77 73 72 71  Resp: 16 16 18 18   Temp: 99 F (37.2 C) 97.6 F (36.4 C) 97.9 F (36.6 C) 97.8 F (36.6 C)  TempSrc: Oral Oral Oral Oral  SpO2: 100% 93% 96% 96%  Weight:      Height:        Neurologically intact Neurovascular intact Intact pulses distally Dorsiflexion/Plantar flexion intact Incision: Moderate drainage noted at the proximal bandage but this is now not active and is dried out. No cellulitis present  Right foot demonstrates tenderness to palpation along the dorsum of the foot along the first and second metatarsals.  Lab Results  Component Value Date   WBC 10.9 (H) 12/23/2018   HGB 8.0 (L) 12/23/2018   HCT 25.4 (L) 12/23/2018   MCV 93.7 12/23/2018   PLT 189 12/23/2018   BMET    Component Value Date/Time   NA 132 (L) 12/23/2018 0207   NA 143 09/28/2018 1445   K 4.2 12/23/2018 0207   CL 102 12/23/2018 0207   CO2 25 12/23/2018 0207   GLUCOSE 110 (H) 12/23/2018 0207   BUN 20 12/23/2018 0207   BUN 32 09/28/2018 1445   CREATININE 1.37 (H) 12/23/2018 0207   CREATININE 1.17 (H) 08/19/2016 1155   CALCIUM 8.4 (L) 12/23/2018 0207   GFRNONAA 33 (L) 12/23/2018 0207   GFRAA 38 (L) 12/23/2018 0207     Assessment/Plan: 2 Days Post-Op   Principal Problem:   Closed right hip fracture (HCC) Active Problems:   Essential hypertension, benign   Takotsubo cardiomyopathy   SIADH (syndrome of inappropriate ADH production) ([facility])   Persistent atrial fibrillation ([facility])   MVP (mitral valve prolapse)   Closed right hip fracture, initial encounter ([facility])   Protein-calorie malnutrition, severe   Up with therapy -Maintain postoperative bandages  as they are not currently draining. -Okay for full weightbearing as tolerated the right leg. -I am going to order x-rays of the right foot today given her history of trauma and pain on exam today.  We will follow-up on those.  I anticipate they may be negative.  -81 mg aspirin twice daily x6 weeks for DVT prophylaxis. Nicholes Stairs 12/23/2018, 7:38 AM   Geralynn Rile, MD [phone]

## 2018-12-23 NOTE — Plan of Care (Signed)

## 2018-12-23 NOTE — Progress Notes (Signed)
Orthopedic Tech Progress Note Patient Details:  [name] 11/21/24 [id number]  Ortho Devices Type of Ortho Device: Postop shoe/boot Ortho Device/Splint Location: Right Foot Ortho Device/Splint Interventions: Ordered, Application, Adjustment   Post Interventions Instructions Provided: Adjustment of device, Care of device   Berel Najjar J Loletta Harper 12/23/2018, 3:07 PM

## 2018-12-23 NOTE — Care Management Important Message (Signed)
Important Message  Patient Details  Name: KIMBERL VIG MRN: [id number] Date of Birth: 08-29-1923   Medicare Important Message Given:  Yes    Shelda Altes 12/23/2018, 3:33 PM

## 2018-12-24 NOTE — Progress Notes (Signed)
   Subjective: 3 Days Post-Op Procedure(s) (LRB): INTRAMEDULLARY (IM) NAIL INTERTROCHANTRIC (Right)  Pt lying comfortably in no acute distress Responds to commands but otherwise does not talk Patient reports pain as mild.  Objective:   VITALS:   Vitals:   12/23/18 2206 12/24/18 0743  BP: (!) 112/41 (!) 140/41  Pulse: 64 66  Resp:  16  Temp:  98.6 F (37 C)  SpO2:  96%    Right hip incision healing well nv intact distally No rashes or edema   LABS Recent Labs    12/22/18 0304 12/23/18 0207  HGB 9.3* 8.0*  HCT 28.7* 25.4*  WBC 10.4 10.9*  PLT 234 189    Recent Labs    12/22/18 0304 12/23/18 0207  NA 136 132*  K 4.1 4.2  BUN 14 20  CREATININE 0.91 1.37*  GLUCOSE 137* 110*     Assessment/Plan: 3 Days Post-Op Procedure(s) (LRB): INTRAMEDULLARY (IM) NAIL INTERTROCHANTRIC (Right) Acute blood loss anemia - will defer to medical team but may need a transfusion if she continues to drift down with her H/H PT/OT as able D/c planning Will continue to monitor her status     Merla Riches PA-C, [place] [facility] [employer] [facility] [address] [place], [place], [place] [address] Phone: [phone] [email or web address] Facebook  [employer]

## 2018-12-24 NOTE — Social Work (Signed)
Contacted SNF, await update regarding insurance approval.  Westley Hummer, MSW, [place] Work [phone]

## 2018-12-24 NOTE — Progress Notes (Signed)
Physical Therapy Treatment Patient Details Name: [name] MRN: [id number] DOB: 10/05/1923 Today's Date: 12/24/2018    History of Present Illness  Ms. 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 Pt very avoidant of putting any wt on RLE making her unable to step LLE and preventing ability to ambulate. Will continue to work on this.     Follow Up Recommendations  SNF     Equipment Recommendations  Other (comment)(TBD at next venue)    Recommendations for Other Services       Precautions / Restrictions Precautions Precautions: Fall Required Braces or Orthoses: Other Brace Other Brace: R post op shoe Restrictions Weight Bearing Restrictions: Yes RLE Weight Bearing: Weight bearing as tolerated    Mobility  Bed Mobility Overal bed mobility: Needs Assistance Bed Mobility: Supine to Sit     Supine to sit: Max assist;+2 for physical assistance;HOB elevated     General bed mobility comments: pt encouraged to assist with bed mobility by grasping bed rail but L shoulder ROM limited and rolling had to be fully initiated from behind. Max A at LE's as well with increased R hip pain with motion  Transfers Overall transfer level: Needs assistance Equipment used: Rolling walker (2 wheeled) Transfers: Stand Pivot Transfers;Sit to/from Stand Sit to Stand: Max assist;+2 physical assistance Stand pivot transfers: Max assist;+2 physical assistance       General transfer comment: pt needed vc's and manual assist for mvmt of RW and each mvmt of feet. Pt very reisistant to [facility] R side to be able to step L foot. vc's for getting back to chair before  sitting but pt fatigues very quickly and had to be assisted into chair  Ambulation/Gait             General Gait Details: pt only sliding feet unable to pick up at this point to ambulate   Stairs             Wheelchair Mobility    Modified Rankin (Stroke Patients Only)       Balance Overall balance assessment: Needs assistance Sitting-balance support: Bilateral upper extremity supported Sitting balance-Leahy Scale: Poor     Standing balance support: Bilateral upper extremity supported Standing balance-Leahy Scale: Zero Standing balance comment: UE support and max A to stand                            Cognition Arousal/Alertness: Awake/alert Behavior During Therapy: WFL for tasks assessed/performed Overall Cognitive Status: Impaired/Different from baseline Area of Impairment: Memory;Awareness                     Memory: Decreased short-term memory     Awareness: Intellectual   General Comments: pt aware of where she was today, STM deficits noted      Exercises General Exercises - Lower Extremity Ankle Circles/Pumps: AROM;Both;15 reps;Seated Heel Slides: AROM;Both;10 reps;Seated    General Comments        Pertinent Vitals/Pain Pain Assessment: Faces Faces Pain Scale: Hurts even more Pain Location: R hip Pain Descriptors / Indicators: Guarding;Discomfort;Aching Pain Intervention(s): Limited activity within patient's tolerance;Monitored during  session    Home Living                      Prior Function            PT Goals (current goals can now be found in the care plan section) Acute Rehab PT Goals Patient Stated Goal: Return home PT Goal Formulation: With patient/family Time For Goal Achievement: 01/05/19 Potential to Achieve Goals: Fair Progress towards PT goals: Progressing toward goals    Frequency    Min 3X/week      PT Plan Current plan remains appropriate    Co-evaluation               AM-PAC PT "6 Clicks" Mobility   Outcome Measure  Help needed turning from your back to your side while in a flat bed without using bedrails?: Total Help needed moving from lying on your back to sitting on the side of a flat bed without using bedrails?: Total Help needed moving to and from a bed to a chair (including a wheelchair)?: Total Help needed standing up from a chair using your arms (e.g., wheelchair or bedside chair)?: Total Help needed to walk in hospital room?: Total Help needed climbing 3-5 steps with a railing? : Total 6 Click Score: 6    End of Session Equipment Utilized During Treatment: Gait belt Activity Tolerance: Patient limited by pain;Patient limited by fatigue Patient left: in chair;with call bell/phone within reach;with chair alarm set;with family/visitor present Nurse Communication: Mobility status;Weight bearing status PT Visit Diagnosis: Unsteadiness on feet (R26.81);Other abnormalities of gait and mobility (R26.89);Muscle weakness (generalized) (M62.81);History of falling (Z91.81);Repeated falls (R29.6);Difficulty in walking, not elsewhere classified (R26.2);Pain Pain - Right/Left: Right Pain - part of body: Hip     Time: 4268-3419 PT Time Calculation (min) (ACUTE ONLY): 33 min  Charges:  $Gait Training: 8-22 mins $Neuromuscular Re-education: 8-22 mins                     [place]  Pager [phone] Office [place] 12/24/2018, 2:25 PM

## 2018-12-24 NOTE — Progress Notes (Addendum)
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 Had hit her head and has a small hematoma. Denies losing consciousness. Denies any chest pain or shortness of breath or palpitations.  She was found to have fracture of the right hip and taken to the OR for intramedullary implant.  Also she continued to complain of right foot pain x-ray was done and it shows  nondisplaced fractures of second, third and fourth metatarsal necks.  Dr. Victorino December orthopedic, is aware of the fracture and advised for her to wear orthopedic shoes and follow-up with him as outpatient.  Subjective: Patient seen and examined at bedside today she is complaining not feeling as well also pain.  She was given hydrocodone which caused her to be loopy and she is reluctant to take that apparently she was given 2 tablets of hydrocodone.  She will try taking only 1 tablet today  Assessment/Plan: Principal Problem:   Closed right hip fracture (HCC) Active Problems:   Essential hypertension, benign   Takotsubo cardiomyopathy   SIADH (syndrome of inappropriate ADH production) (HCC)   Persistent atrial fibrillation (HCC)   MVP (mitral valve prolapse)   Closed right hip fracture, initial encounter (HCC)   Protein-calorie malnutrition, severe   Nondisplaced fracture of distal phalanx of right lesser toe(s), initial encounter for closed fracture  #1 mechanical fall  2.  Right hip fracture status post internal fixation patient is currently receiving physical therapy.  They are  recommending SNF.  She is waiting for insurance approval and authorization  3.  Protein malnutrition continue supplements  4.  Atrial fibrillation  5.  Right toe fracture,she had continued to complain of right foot pain x-ray was done and it shows  nondisplaced fractures of second, third and fourth metatarsal necks.  Dr. Victorino December orthopedic, is aware of the fracture and advised for her to wear orthopedic shoes and follow-up with him as outpatient   Code Status: DNR  Severity of Illness: The appropriate patient status for this patient is INPATIENT. Inpatient status is judged to be reasonable and necessary in order to provide the required intensity of service to ensure the patient's safety. The patient's presenting symptoms, physical exam findings, and initial radiographic and laboratory data in the context of their chronic comorbidities is felt to place them at high risk for further clinical deterioration. Furthermore, it is not anticipated that the patient will be medically stable for discharge from the hospital within 2 midnights of admission. The following factors support the patient status of inpatient.  Patient waiting for SNF * I certify that at the point of admission it is my clinical judgment that the patient will require inpatient hospital care spanning beyond 2 midnights from the point of admission due to high intensity of service, high risk for further deterioration and high frequency of surveillance required.*    Family Communication: Granddaughter Ardell  Disposition Plan: To SNF.  Patient has a bed available but they are waiting insurance authorization   Consultants:   Orthopedic Dr. Victorino December  Physical therapy  Occupational Therapy  Procedures:  Treatment of intertrochanteric, pertrochanteric, subtrochanteric fracture  with intramedullary implant. CPT 27245     Antimicrobials:  None  DVT prophylaxis: SCD   Objective: Vitals:   12/23/18 2206 12/24/18  0743 12/24/18 1141 12/24/18 1501  BP: (!) 112/41 (!) 140/41 (!) 93/58 (!) 122/39  Pulse: 64 66 61 66  Resp:  16 16 16   Temp:  98.6 F (37 C) 99.1 F (37.3 C) 98.7 F (37.1 C)  TempSrc:  Oral Oral Oral  SpO2:  96% 96% 98%  Weight:      Height:        Intake/Output Summary (Last 24 hours) at 12/24/2018 1927 Last data filed at 12/24/2018 1631 Gross per 24 hour  Intake 360 ml  Output 1750 ml  Net -1390 ml   Filed Weights   12/21/18 1446  Weight: 54.4 kg   Body mass index is 21.26 kg/m.  Exam:  . General: 85 y.o. year-old female well developed well nourished in no acute distress.  Alert and oriented x3. . Cardiovascular: Regular rate and rhythm with no rubs or gallops.  No thyromegaly or JVD noted.   Marland Kitchen Respiratory: Clear to auscultation with no wheezes or rales. Good inspiratory effort. . Abdomen: Soft nontender nondistended with normal bowel sounds x4 quadrants. . Musculoskeletal: Severe arthritic deformity of both hands with Heberden nodes of both hands ,right hip is tender no swelling.  She also has swelling and tenderness of the right toes, no lower extremity edema. 2/4 pulses in all 4 extremities. . Skin: No ulcerative lesions noted or rashes, . Psychiatry: Mood is appropriate for condition and setting    Data Reviewed: CBC: Recent Labs  Lab 12/21/18 0019 12/22/18 0304 12/23/18 0207  WBC 10.4 10.4 10.9*  NEUTROABS 7.5 9.4* 7.3  HGB 11.3* 9.3* 8.0*  HCT 35.8* 28.7* 25.4*  MCV 93.2 93.8 93.7  PLT 319 234 765   Basic Metabolic Panel: Recent Labs  Lab 12/21/18 0019 12/22/18 0304 12/23/18 0207  NA 136 136 132*  K 5.0 4.1 4.2  CL 106 103 102  CO2 23 25 25   GLUCOSE 140* 137* 110*  BUN 35* 14 20  CREATININE 1.21* 0.91 1.37*  CALCIUM 8.9 8.3* 8.4*  MG  --  1.8  --    GFR: Estimated Creatinine Clearance: 21.2 mL/min (A) (by C-G formula based on SCr of 1.37 mg/dL (H)). Liver Function Tests: Recent Labs  Lab 12/22/18 0304 12/23/18 0207  AST 86* 53*  ALT  64* 37  ALKPHOS 82 69  BILITOT 0.6 0.7  PROT 5.6* 5.4*  ALBUMIN 2.6* 2.3*   No results for input(s): LIPASE, AMYLASE in the last 168 hours. No results for input(s): AMMONIA in the last 168 hours. Coagulation Profile: No results for input(s): INR, PROTIME in the last 168 hours. Cardiac Enzymes: No results for input(s): CKTOTAL, CKMB, CKMBINDEX, TROPONINI in the last 168 hours. BNP (last 3 results) Recent Labs    05/02/18 1514 09/19/18 1551  PROBNP 419 485   HbA1C: No results for input(s): HGBA1C in the last 72 hours. CBG: No results for input(s): GLUCAP in the last 168 hours. Lipid Profile: No results for input(s): CHOL, HDL, LDLCALC, TRIG, CHOLHDL, LDLDIRECT in the last 72 hours. Thyroid Function Tests: No results for input(s): TSH, T4TOTAL, FREET4, T3FREE, THYROIDAB in the last 72 hours. Anemia Panel: No results for input(s): VITAMINB12, FOLATE, FERRITIN, TIBC, IRON, RETICCTPCT in the last 72 hours. Urine analysis:    Component Value Date/Time   COLORURINE YELLOW 12/21/2018 [place] 12/21/2018 0347   LABSPEC 1.021  12/21/2018 [place] 5.0 12/21/2018 [place] 12/21/2018 0347   HGBUR NEGATIVE 12/21/2018 0347   BILIRUBINUR NEGATIVE 12/21/2018 [place] 12/21/2018 0347   PROTEINUR NEGATIVE 12/21/2018 0347   UROBILINOGEN 1.0 07/20/2014 1209   NITRITE NEGATIVE 12/21/2018 0347   LEUKOCYTESUR NEGATIVE 12/21/2018 0347   Sepsis Labs: @LABRCNTIP (procalcitonin:4,lacticidven:4)  ) Recent Results (from the past 240 hour(s))  MRSA PCR Screening     Status: None   Collection Time: 12/21/18  1:00 PM  Result Value Ref Range Status   MRSA by PCR NEGATIVE NEGATIVE Final    Comment:        The GeneXpert MRSA Assay (FDA approved for NASAL specimens only), is one component of a comprehensive MRSA colonization surveillance program. It is not intended to diagnose MRSA infection nor to guide or monitor treatment for MRSA  infections. Performed at [facility], [place] [address] [place], [place] [address]       Studies: No results found.  Scheduled Meds: . carvedilol  12.5 mg Oral BID WC  . demeclocycline  150 mg Oral BID  . docusate sodium  100 mg Oral BID  . feeding supplement (ENSURE ENLIVE)  237 mL Oral BID BM  . multivitamin with minerals  1 tablet Oral Daily    Continuous Infusions:   LOS: 3 days     Cristal Deer, MD [facility]  To reach me or the doctor on call, go to: [email or web address] Password [facility]  12/24/2018, 7:27 PM

## 2018-12-25 ENCOUNTER — Encounter (HOSPITAL_COMMUNITY): Payer: Self-pay | Admitting: *Deleted

## 2018-12-25 MED ORDER — BUPROPION HCL ER (SR) 150 MG PO TB12
150.0000 mg | ORAL_TABLET | Freq: Every day | ORAL | Status: DC
  Administered 2018-12-25 – 2018-12-26 (×2): 150 mg via ORAL
  Filled 2018-12-25 (×2): qty 1

## 2018-12-25 NOTE — Progress Notes (Signed)
PROGRESS NOTE  IVEY NEMBHARD TKW:[id number] DOB: 04-22-1923 DOA: 12/20/2018 PCP: Lajean Manes, MD  HPI/Recap of past 24 hours: Cheryl Heap Christiansenis a 85 y.o.femalewithhistory of Takotsubo's cardiomyopathy which has improved last EF measured was in December 2019 EF of 55 to 60% with grade 1 diastolic dysfunction on Lasix, hypertension, paroxysmal atrial fibrillation not on anticoagulation, depression, SIADH on demeclocycline had a fall at her living facility when she was trying to turn. Had hit her head and has a small hematoma. Denies losing consciousness. Denies any chest pain or shortness of breath or palpitations.  She was found to have fracture of the right hip and taken to the OR for intramedullary implant.  Also she continued to complain of right foot pain x-ray was done and it shows  nondisplaced fractures of second, third and fourth metatarsal necks.  Dr. Victorino December orthopedic, is aware of the fracture and advised for her to wear orthopedic shoes and follow-up with him as outpatient.  Subjective: Patient seen and examined at bedside today she is complaining not feeling as well also pain.  She was given hydrocodone which caused her to be loopy and she is reluctant to take that apparently she was given 2 tablets of hydrocodone.  She will try taking only 1 tablet today  December 25, 2018, subjective: Patient seen and examined she is sitting in the recliner chair at bedside her son [name] who is also power of attorney is present.  He feels like she is getting little more depressed because she has not been getting her Wellbutrin since admission.  She is waiting for nursing home placement approval by her insurance  Assessment/Plan: Principal Problem:   Closed right hip fracture ([place]) Active Problems:   Essential hypertension, benign   Takotsubo cardiomyopathy   SIADH (syndrome of inappropriate ADH production) (HCC)   Persistent atrial fibrillation (HCC)   MVP (mitral  valve prolapse)   Closed right hip fracture, initial encounter ([place])   Protein-calorie malnutrition, severe   Nondisplaced fracture of distal phalanx of right lesser toe(s), initial encounter for closed fracture  #1 mechanical fall  2.  Right hip fracture status post internal fixation patient is currently receiving physical therapy.  They are recommending SNF.  She is waiting for insurance approval and authorization  3.  Protein malnutrition continue supplements  4.  Atrial fibrillation  5.  Right toe fracture,she had continued to complain of right foot pain x-ray was done and it shows  nondisplaced fractures of second, third and fourth metatarsal necks.  Dr. Victorino December orthopedic, is aware of the fracture and advised for her to wear orthopedic shoes and follow-up with him as outpatient  6.  Depression, because patient was put in on n.p.o. for surgery when she was admitted she has not been getting her Wellbutrin her son is concerned that she is getting more depressed.  We will restart her Wellbutrin   Code Status: DNR  Severity of Illness: The appropriate patient status for this patient is INPATIENT. Inpatient status is judged to be reasonable and necessary in order to provide the required intensity of service to ensure the patient's safety. The patient's presenting symptoms, physical exam findings, and initial radiographic and laboratory data in the context of their chronic comorbidities is felt to place them at high risk for further clinical deterioration. Furthermore, it is not anticipated that the patient will be medically stable for discharge from the hospital within 2 midnights of admission. The following factors support the patient status of inpatient.  Patient waiting for SNF * I certify that at the point of admission it is my clinical judgment that the patient will require inpatient hospital care spanning beyond 2 midnights from the point of admission due to high intensity of  service, high risk for further deterioration and high frequency of surveillance required.*    Family Communication: Son [name] who is also power of attorney at bedside  Disposition Plan: To SNF.  Patient has a bed available but they are waiting insurance authorization   Consultants:   Orthopedic Dr. Victorino December  Physical therapy  Occupational Therapy  Procedures:  Treatment of intertrochanteric, pertrochanteric, subtrochanteric fracture with intramedullary implant. CPT [phone]     Antimicrobials:  None  DVT prophylaxis: SCD   Objective: Vitals:   12/24/18 1501 12/24/18 1951 12/25/18 0518 12/25/18 0729  BP: (!) 122/39 103/60 (!) 132/51 (!) 129/41  Pulse: 66 62 69 68  Resp: 16 17 19 16   Temp: 98.7 F (37.1 C) 98.6 F (37 C) 98.4 F (36.9 C) 98.3 F (36.8 C)  TempSrc: Oral Oral Oral Oral  SpO2: 98% 96% 97% 98%  Weight:      Height:        Intake/Output Summary (Last 24 hours) at 12/25/2018 1250 Last data filed at 12/25/2018 1100 Gross per 24 hour  Intake 480 ml  Output 1500 ml  Net -1020 ml   Filed Weights   12/21/18 1446  Weight: 54.4 kg   Body mass index is 21.26 kg/m.  Exam:  . General: 85 y.o. year-old female well developed well nourished in no acute distress.  Alert and oriented x3. . Cardiovascular: Regular rate and rhythm with no rubs or gallops.  No thyromegaly or JVD noted.   Marland Kitchen Respiratory: Clear to auscultation with no wheezes or rales. Good inspiratory effort. . Abdomen: Soft nontender nondistended with normal bowel sounds x4 quadrants. . Musculoskeletal: Severe arthritic deformity of both hands with Heberden nodes of both hands ,right hip is tender no swelling.  She also has swelling and tenderness of the right toes, no lower extremity edema. 2/4 pulses in all 4 extremities. . Skin: No ulcerative lesions noted or rashes, . Psychiatry: Mood is appropriate for condition and setting    Data Reviewed: CBC: Recent Labs  Lab 12/21/18 0019  12/22/18 0304 12/23/18 0207  WBC 10.4 10.4 10.9*  NEUTROABS 7.5 9.4* 7.3  HGB 11.3* 9.3* 8.0*  HCT 35.8* 28.7* 25.4*  MCV 93.2 93.8 93.7  PLT 319 234 789   Basic Metabolic Panel: Recent Labs  Lab 12/21/18 0019 12/22/18 0304 12/23/18 0207  NA 136 136 132*  K 5.0 4.1 4.2  CL 106 103 102  CO2 23 25 25   GLUCOSE 140* 137* 110*  BUN 35* 14 20  CREATININE 1.21* 0.91 1.37*  CALCIUM 8.9 8.3* 8.4*  MG  --  1.8  --    GFR: Estimated Creatinine Clearance: 21.2 mL/min (A) (by C-G formula based on SCr of 1.37 mg/dL (H)). Liver Function Tests: Recent Labs  Lab 12/22/18 0304 12/23/18 0207  AST 86* 53*  ALT 64* 37  ALKPHOS 82 69  BILITOT 0.6 0.7  PROT 5.6* 5.4*  ALBUMIN 2.6* 2.3*   No results for input(s): LIPASE, AMYLASE in the last 168 hours. No results for input(s): AMMONIA in the last 168 hours. Coagulation Profile: No results for input(s): INR, PROTIME in the last 168 hours. Cardiac Enzymes: No results for input(s): CKTOTAL, CKMB, CKMBINDEX, TROPONINI in the last 168 hours. BNP (last 3 results) Recent Labs  05/02/18 1514 09/19/18 1551  PROBNP 419 485   HbA1C: No results for input(s): HGBA1C in the last 72 hours. CBG: No results for input(s): GLUCAP in the last 168 hours. Lipid Profile: No results for input(s): CHOL, HDL, LDLCALC, TRIG, CHOLHDL, LDLDIRECT in the last 72 hours. Thyroid Function Tests: No results for input(s): TSH, T4TOTAL, FREET4, T3FREE, THYROIDAB in the last 72 hours. Anemia Panel: No results for input(s): VITAMINB12, FOLATE, FERRITIN, TIBC, IRON, RETICCTPCT in the last 72 hours. Urine analysis:    Component Value Date/Time   COLORURINE YELLOW 12/21/2018 [place] 12/21/2018 0347   LABSPEC 1.021 12/21/2018 0347   PHURINE 5.0 12/21/2018 0347   GLUCOSEU NEGATIVE 12/21/2018 0347   HGBUR NEGATIVE 12/21/2018 0347   BILIRUBINUR NEGATIVE 12/21/2018 0347   KETONESUR NEGATIVE 12/21/2018 0347   PROTEINUR NEGATIVE 12/21/2018 0347     UROBILINOGEN 1.0 07/20/2014 1209   NITRITE NEGATIVE 12/21/2018 0347   LEUKOCYTESUR NEGATIVE 12/21/2018 0347   Sepsis Labs: @LABRCNTIP (procalcitonin:4,lacticidven:4)  ) Recent Results (from the past 240 hour(s))  MRSA PCR Screening     Status: None   Collection Time: 12/21/18  1:00 PM  Result Value Ref Range Status   MRSA by PCR NEGATIVE NEGATIVE Final    Comment:        The GeneXpert MRSA Assay (FDA approved for NASAL specimens only), is one component of a comprehensive MRSA colonization surveillance program. It is not intended to diagnose MRSA infection nor to guide or monitor treatment for MRSA infections. Performed at [facility], [place] [address], [place], [place] [address]       Studies: No results found.  Scheduled Meds: . carvedilol  12.5 mg Oral BID WC  . demeclocycline  150 mg Oral BID  . docusate sodium  100 mg Oral BID  . feeding supplement (ENSURE ENLIVE)  237 mL Oral BID BM  . multivitamin with minerals  1 tablet Oral Daily    Continuous Infusions:   LOS: 4 days     Cristal Deer, MD [facility]  To reach me or the doctor on call, go to: [email or web address] Password [facility]  12/25/2018, 12:50 PM

## 2018-12-25 NOTE — Progress Notes (Signed)
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  Completes bed mobility with mod assist +2 today, stand pivot transfer to recliner with max assist +2.  Continues to require max assist for LB ADLs and min assist for UB ADLs. Limited during session by pain in R LE  (limiting weightbearing through LE), decreased activity tolerance/genearlized weakness, and cognition.  DC plan remains appropriate at SNF.  Will follow.    Follow Up Recommendations  SNF;Supervision/Assistance - 24 hour    Equipment Recommendations  3 in 1 bedside commode    Recommendations for Other Services      Precautions / Restrictions Precautions Precautions: Fall Required Braces or Orthoses: Other Brace Other Brace: R post op shoe Restrictions Weight Bearing Restrictions: Yes RLE Weight Bearing: Weight bearing as tolerated       Mobility Bed Mobility Overal bed mobility: Needs Assistance Bed Mobility: Supine to Sit     Supine to sit: Mod assist;+2 for physical assistance;+2 for safety/equipment;HOB elevated     General bed mobility comments: pt able to assist transfer by using R UE/L UE, as well as mgmt of L LE off EOB; max assist for R LE and mod assist +2 for trunk; initated scooting foward to EOB and assist to complete task   Transfers Overall transfer level: Needs assistance Equipment used: Rolling walker (2 wheeled) Transfers: Sit to/from [facility] Sit to Stand: Max assist;+2 physical assistance Stand pivot transfers: Max assist;+2 physical  assistance       General transfer comment: verbal cueing and assist for mgmt of RW, coordination and sequencing steps to transfer to recliner; cueing to maintain upright posture, but noted improved pivoting with L LE today, continues to  be lmited by weightbearing through R LE when stepping with L     Balance Overall balance assessment: Needs assistance Sitting-balance support: Bilateral upper extremity supported Sitting balance-Leahy Scale: Fair Sitting balance - Comments: cueing to maintain posture, min to min guard for safety    Standing balance support: Bilateral upper extremity supported Standing balance-Leahy Scale: Poor Standing balance comment: reliant on B UE and external support                           ADL either performed or assessed with clinical judgement   ADL Overall ADL's : Needs assistance/impaired     Grooming: Set up;Supervision/safety;Sitting;Wash/dry hands;Wash/dry face       Lower Body Bathing: Maximal assistance;+2 for physical assistance;Sit to/from stand Lower Body Bathing Details (indicate cue type and reason): +2 max assist in standing, decreased reach to feet      Lower Body Dressing: Maximal assistance;Sit to/from stand Lower Body Dressing Details (indicate cue type and reason): requires max assist for L LE shoe, total assist for R LE shoe- +2 max assist in standing  Toilet Transfer: Maximal assistance;+2 for physical assistance;+2 for safety/equipment;Stand-pivot;Cueing for safety;Cueing for sequencing;RW Toilet Transfer Details (indicate cue type and reason): simulated to recliner          Functional mobility during ADLs: Maximal assistance;+2 for physical assistance;+2 for safety/equipment;Cueing for safety;Cueing for  sequencing;Rolling walker       Vision       Perception     Praxis      Cognition Arousal/Alertness: Awake/alert Behavior During Therapy: WFL for tasks assessed/performed Overall Cognitive Status:  Impaired/Different from baseline Area of Impairment: Memory;Awareness                     Memory: Decreased short-term memory     Awareness: Emergent   General Comments: pt aware of where she was today, STM deficits noted        Exercises     Shoulder Instructions       General Comments      Pertinent Vitals/ Pain       Pain Assessment: Faces Faces Pain Scale: Hurts even more Pain Location: R hip Pain Descriptors / Indicators: Guarding;Discomfort;Aching Pain Intervention(s): Limited activity within patient's tolerance;Monitored during session;Repositioned  Home Living                                          Prior Functioning/Environment              Frequency  Min 2X/week        Progress Toward Goals  OT Goals(current goals can now be found in the care plan section)  Progress towards OT goals: Progressing toward goals  Acute Rehab OT Goals Patient Stated Goal: Return home OT Goal Formulation: With patient/family Time For Goal Achievement: 01/05/19 Potential to Achieve Goals: [place] Discharge plan remains appropriate;Frequency remains appropriate    Co-evaluation                 AM-PAC OT "6 Clicks" Daily Activity     Outcome Measure   Help from another person eating meals?: None Help from another person taking care of personal grooming?: None(seated) Help from another person toileting, which includes using toliet, bedpan, or urinal?: A Lot Help from another person bathing (including washing, rinsing, drying)?: A Lot Help from another person to put on and taking off regular upper body clothing?: A Little Help from another person to put on and taking off regular lower body clothing?: A Lot 6 Click Score: 17    End of Session Equipment Utilized During Treatment: Gait belt;Rolling walker  OT Visit Diagnosis: Unsteadiness on feet (R26.81);History of falling (Z91.81);Muscle weakness (generalized)  (M62.81);Pain Pain - Right/Left: Right Pain - part of body: Hip   Activity Tolerance Patient tolerated treatment well   Patient Left in chair;with call bell/phone within reach;with chair alarm set   Nurse Communication Mobility status;Weight bearing status        Time: 2482-5003 OT Time Calculation (min): 23 min  Charges: OT General Charges $OT Visit: 1 Visit OT Treatments $Self Care/Home Management : 23-37 mins  Delight Stare, [place] Pager [phone] Office [phone]    Delight Stare 12/25/2018, 9:16 AM

## 2018-12-26 DIAGNOSIS — I5181 Takotsubo syndrome: Secondary | ICD-10-CM

## 2018-12-26 LAB — GLUCOSE, CAPILLARY: Glucose-Capillary: 85 mg/dL (ref 70–99)

## 2018-12-26 MED ORDER — BUPROPION HCL ER (SR) 150 MG PO TB12: 150.0000 mg | ORAL_TABLET | ORAL | Status: DC

## 2018-12-26 NOTE — Progress Notes (Signed)
Notified MD of DBP. Instructed to hold am dose of coreg x 1 dose. Pt and family informed. Pt remains asymptomatic.

## 2018-12-26 NOTE — Discharge Summary (Signed)
Discharge Summary  [name] DGU:[id number] DOB: 11/29/1922  PCP: Lajean Manes, MD  Admit date: 12/20/2018 Discharge date: 12/26/2018  Recommendations for Outpatient Follow-up:  1. Orthopedic  Discharge Diagnoses:  Active Hospital Problems   Diagnosis Date Noted  . Closed right hip fracture ([place]) 12/21/2018  . Nondisplaced fracture of distal phalanx of right lesser toe(s), initial encounter for closed fracture 12/23/2018  . Closed right hip fracture, initial encounter ([place]) 12/21/2018  . Protein-calorie malnutrition, severe 12/21/2018  . MVP (mitral valve prolapse) 03/03/2016  . Takotsubo cardiomyopathy   . SIADH (syndrome of inappropriate ADH production) ([place])   . Persistent atrial fibrillation ([place])   . Essential hypertension, benign 10/08/2011    Resolved Hospital Problems  No resolved problems to display.    Discharge Condition: Improved  Diet recommendation: Healthy heart with Ensure supplement  Vitals:   12/25/18 1335 12/25/18 2155  BP: (!) 128/46 (!) 126/46  Pulse: 65 62  Resp: 16   Temp: 98.6 F (37 C) 98.5 F (36.9 C)  SpO2: 98% 97%    History of present illness:   [name] is a 85 y.o. female with history of Takotsubo's cardiomyopathy which has improved last EF measured was in December 2019 EF of 55 to 60% with grade 1 diastolic dysfunction on Lasix, hypertension, paroxysmal atrial fibrillation not on anticoagulation, depression, SIADH on demeclocycline had a fall at her living facility when she was trying to turn.  Had hit her head and has a small hematoma.  Denies losing consciousness.  Denies any chest pain or shortness of breath or palpitations.  She was found to have fracture of the right hip and taken to the OR on 12/21/18 for ORIF.  Also she continued to complained of right foot pain x-ray was done and it shows  nondisplaced fractures of second, third and fourth metatarsal necks.  Dr. Victorino December orthopedic, is aware of the  fracture and advised for her to wear orthopedic shoes and follow-up with him as outpatient.   This dc summary was done 3/4 originally but pt was not dc'd until today due to wait for SNF bed.  In meantime home Wellbutrin was resumed due to son's concern about depressive symptoms. No other new issues.  DC to SNF today.   Hospital Course:  Principal Problem:   Closed right hip fracture (HCC) Active Problems:   Essential hypertension, benign   Takotsubo cardiomyopathy   SIADH (syndrome of inappropriate ADH production) (HCC)   Persistent atrial fibrillation (HCC)   MVP (mitral valve prolapse)   Closed right hip fracture, initial encounter (HCC)   Protein-calorie malnutrition, severe   Nondisplaced fracture of distal phalanx of right lesser toe(s), initial encounter for closed fracture Patient underwent intraoperative peritrochanteric intramedullary implant she tolerated the procedure well. Per orthopedic below is the plan of care. : The patient will be weight bearing as tolerated and will return in 2 weeks for staple removal and the patient will receive DVT prophylaxis based on other medications, activity level, and risk ratio of bleeding to thrombosis.  She will be on bid 81 mg asa for 6 weeks for DVT PPx.   Procedures:   Treatment of intertrochanteric, pertrochanteric, subtrochanteric fracture with intramedullary implant. CPT [phone]    Consultations:  Orthopedic Dr. Victorino December  Physical therapy  Occupational Therapy  Discharge Exam: BP (!) 126/46   Pulse 62   Temp 98.5 F (36.9 C) (Oral)   Resp 16   Ht 5\' 3"  (1.6 m)   Wt 54.4  kg   SpO2 97%   BMI 21.26 kg/m   General: Alert oriented x3 elderly female no distress Cardiovascular: Regular rate and rhythm Respiratory: Work of breathing is normal no rhonchi Lower extremity right hip has dressing no swelling.  She has also swelling and pain to the right toes which she stated has been going on since her fall on the day of  admission December 21, 2018.  There is tenderness to palpation.  Discharge Instructions You were cared for by a hospitalist during your hospital stay. If you have any questions about your discharge medications or the care you received while you were in the hospital after you are discharged, you can call the unit and asked to speak with the hospitalist on call if the hospitalist that took care of you is not available. Once you are discharged, your primary care physician will handle any further medical issues. Please note that NO REFILLS for any discharge medications will be authorized once you are discharged, as it is imperative that you return to your primary care physician (or establish a relationship with a primary care physician if you do not have one) for your aftercare needs so that they can reassess your need for medications and monitor your lab values.  Discharge Instructions    Call MD for:  persistant nausea and vomiting   Complete by:  As directed    Call MD for:  severe uncontrolled pain   Complete by:  As directed    Call MD for:  temperature >100.4   Complete by:  As directed    Diet - low sodium heart healthy   Complete by:  As directed    Discharge instructions   Complete by:  As directed    Follow-up with orthopedic   Increase activity slowly   Complete by:  As directed    Full weightbearing per orthopedic, also use postop orthopedic shoe for the right foot fracture     Allergies as of 12/26/2018      Reactions   Amlodipine Swelling   To feet and ankles    Avelox [moxifloxacin Hcl In Nacl] Other (See Comments)   Pt does not remember   Duragesic Disc Transdermal System [fentanyl] Other (See Comments)   Reaction unknown   Morphine And Related Other (See Comments)   "My Sister and daughter can,t take it. I've had it before without problems."   Teriparatide (recombinant) Other (See Comments)   Made skin dry   Lidocaine Rash      Medication List    TAKE these medications     acetaminophen 500 MG tablet Commonly known as:  TYLENOL Take 500 mg by mouth every 6 (six) hours as needed for mild pain, fever or headache.   amLODipine 2.5 MG tablet Commonly known as:  NORVASC TAKE 1 TABLET BY MOUTH W/ 5MG  TAB DAILY What changed:  See the new instructions.   amLODipine 5 MG tablet Commonly known as:  NORVASC TAKE 1 TABLET BY MOUTH EVERY DAY What changed:    how much to take  additional instructions   azelastine 0.1 % nasal spray Commonly known as:  ASTELIN Place 2 sprays into both nostrils 2 (two) times daily as needed for allergies.   buPROPion 150 MG 12 hr tablet Commonly known as:  WELLBUTRIN SR Take 1 tablet (150 mg total) by mouth every morning.   carvedilol 12.5 MG tablet Commonly known as:  COREG Take 1 tablet (12.5 mg total) by mouth 2 (two) times daily with  a meal.   CENTRUM CARDIO PO Take 1 tablet by mouth 2 (two) times daily.   cetirizine 10 MG tablet Commonly known as:  ZYRTEC Take 10 mg by mouth daily.   demeclocycline 150 MG tablet Commonly known as:  DECLOMYCIN Take 150 mg by mouth 2 (two) times daily.   diclofenac sodium 1 % Gel Commonly known as:  VOLTAREN Apply 1 application topically 4 (four) times daily as needed (pain).   feeding supplement (ENSURE ENLIVE) Liqd Take 237 mLs by mouth 2 (two) times daily between meals.   fish oil-omega-3 fatty acids 1000 MG capsule Take 1 g by mouth daily.   furosemide 20 MG tablet Commonly known as:  LASIX Take 1 tablet (20 mg total) by mouth every other day. What changed:    when to take this  reasons to take this   ibuprofen 200 MG tablet Commonly known as:  ADVIL,MOTRIN Take 200 mg by mouth every 8 (eight) hours as needed for moderate pain.   ondansetron 4 MG tablet Commonly known as:  ZOFRAN Take 1 tablet (4 mg total) by mouth every 6 (six) hours as needed for nausea.   polyethylene glycol packet Commonly known as:  MIRALAX / GLYCOLAX Take 17 g by mouth daily as needed  for mild constipation.   traMADol 300 MG 24 hr tablet Commonly known as:  ULTRAM-ER Take 300 mg by mouth daily as needed for pain.   ZANTAC PO Take 1 tablet by mouth daily.      Allergies  Allergen Reactions  . Amlodipine Swelling    To feet and ankles   . Avelox [Moxifloxacin Hcl In Nacl] Other (See Comments)    Pt does not remember   . Duragesic Disc Transdermal System [Fentanyl] Other (See Comments)    Reaction unknown  . Morphine And Related Other (See Comments)    "My Sister and daughter can,t take it. I've had it before without problems."  . Teriparatide (Recombinant) Other (See Comments)    Made skin dry  . Lidocaine Rash   Follow-up Information    Nicholes Stairs, MD In 2 weeks.   Specialty:  Orthopedic Surgery Why:  For suture removal, For wound re-check Contact information: [address] [place] [address] [phone]            The results of significant diagnostics from this hospitalization (including imaging, microbiology, ancillary and laboratory) are listed below for reference.    Significant Diagnostic Studies: Dg Chest 1 View  Result Date: 12/21/2018 CLINICAL DATA:  Leg pain EXAM: CHEST  1 VIEW COMPARISON:  07/20/2014 FINDINGS: No acute consolidation or effusion. Mild cardiomegaly with aortic atherosclerosis. No pneumothorax. IMPRESSION: No active disease.  Mild cardiomegaly Electronically Signed   By: Donavan Foil M.D.   On: 12/21/2018 01:25   Dg Lumbar Spine Complete  Result Date: 12/21/2018 CLINICAL DATA:  Leg pain EXAM: LUMBAR SPINE - COMPLETE 4+ VIEW COMPARISON:  MRI 01/02/2007 FINDINGS: Dense aortic atherosclerosis. Study limited by scoliosis and osteopenia. Multiple treated compression fractures at L3, T12 and L1 with additional treated compression deformities at the lower thoracic spine. Moderate severe compression fracture T11, chronic. Advanced degenerative change at L4-L5 and L5-S1. Moderate severe degenerative  change L3-L4. IMPRESSION: 1. Limited study secondary to scoliosis and osteopenia. 2. Multiple treated compression fractures of the thoracolumbar spine with multiple level degenerative changes, most marked at L3 through S1. No gross acute osseous abnormality. Electronically Signed   By: Donavan Foil M.D.   On: 12/21/2018  01:30   Ct Head Wo Contrast  Result Date: 12/21/2018 CLINICAL DATA:  Fall with head trauma EXAM: CT HEAD WITHOUT CONTRAST CT CERVICAL SPINE WITHOUT CONTRAST TECHNIQUE: Multidetector CT imaging of the head and cervical spine was performed following the standard protocol without intravenous contrast. Multiplanar CT image reconstructions of the cervical spine were also generated. COMPARISON:  Head CT 11/14/2005 FINDINGS: CT HEAD FINDINGS Brain: There is no mass, hemorrhage or extra-axial collection. There is generalized atrophy without lobar predilection. Areas of hypoattenuation of the deep gray nuclei and confluent periventricular white matter hypodensity, consistent with chronic small vessel disease. Old left basal ganglia lacunar infarct. Vascular: Atherosclerotic calcification of the internal carotid arteries at the skull base. No abnormal hyperdensity of the major intracranial arteries or dural venous sinuses. Skull: Right frontoparietal scalp hematoma.  No skull fracture. Sinuses/Orbits: No fluid levels or advanced mucosal thickening of the visualized paranasal sinuses. No mastoid or middle ear effusion. The orbits are normal. CT CERVICAL SPINE FINDINGS Alignment: Grade 1 anterolisthesis at C3-4 and C4-5, likely secondary to facet arthrosis. Skull base and vertebrae: No acute fracture. Soft tissues and spinal canal: No prevertebral fluid or swelling. No visible canal hematoma. Disc levels: There is severe overgrowth at the right atlantoaxial joint with a large anterior osteophyte. Hypertrophy and calcific fragments surround the odontoid process. There is multilevel severe facet hypertrophy.  No bony spinal canal stenosis. Severe left C3-4, left C4-5, right C5-6 neural foraminal stenosis. Upper chest: No pneumothorax, pulmonary nodule or pleural effusion. Other: Normal visualized paraspinal cervical soft tissues. IMPRESSION: 1. No acute intracranial abnormality. Findings of chronic small vessel disease and generalized atrophy. 2. Right frontoparietal scalp hematoma without skull fracture. 3. No acute fracture of the cervical spine. 4. Severe chronic atlantoaxial degenerative change. 5. Multilevel severe facet arthrosis with associated foraminal stenosis. Electronically Signed   By: Ulyses Jarred M.D.   On: 12/21/2018 01:19   Ct Cervical Spine Wo Contrast  Result Date: 12/21/2018 CLINICAL DATA:  Fall with head trauma EXAM: CT HEAD WITHOUT CONTRAST CT CERVICAL SPINE WITHOUT CONTRAST TECHNIQUE: Multidetector CT imaging of the head and cervical spine was performed following the standard protocol without intravenous contrast. Multiplanar CT image reconstructions of the cervical spine were also generated. COMPARISON:  Head CT 11/14/2005 FINDINGS: CT HEAD FINDINGS Brain: There is no mass, hemorrhage or extra-axial collection. There is generalized atrophy without lobar predilection. Areas of hypoattenuation of the deep gray nuclei and confluent periventricular white matter hypodensity, consistent with chronic small vessel disease. Old left basal ganglia lacunar infarct. Vascular: Atherosclerotic calcification of the internal carotid arteries at the skull base. No abnormal hyperdensity of the major intracranial arteries or dural venous sinuses. Skull: Right frontoparietal scalp hematoma.  No skull fracture. Sinuses/Orbits: No fluid levels or advanced mucosal thickening of the visualized paranasal sinuses. No mastoid or middle ear effusion. The orbits are normal. CT CERVICAL SPINE FINDINGS Alignment: Grade 1 anterolisthesis at C3-4 and C4-5, likely secondary to facet arthrosis. Skull base and vertebrae: No  acute fracture. Soft tissues and spinal canal: No prevertebral fluid or swelling. No visible canal hematoma. Disc levels: There is severe overgrowth at the right atlantoaxial joint with a large anterior osteophyte. Hypertrophy and calcific fragments surround the odontoid process. There is multilevel severe facet hypertrophy. No bony spinal canal stenosis. Severe left C3-4, left C4-5, right C5-6 neural foraminal stenosis. Upper chest: No pneumothorax, pulmonary nodule or pleural effusion. Other: Normal visualized paraspinal cervical soft tissues. IMPRESSION: 1. No acute intracranial abnormality. Findings of chronic small  vessel disease and generalized atrophy. 2. Right frontoparietal scalp hematoma without skull fracture. 3. No acute fracture of the cervical spine. 4. Severe chronic atlantoaxial degenerative change. 5. Multilevel severe facet arthrosis with associated foraminal stenosis. Electronically Signed   By: Ulyses Jarred M.D.   On: 12/21/2018 01:19   Dg Foot 2 Views Right  Result Date: 12/23/2018 CLINICAL DATA:  Per patient fall about 1 week ago, reports dorsal swelling and bruising. EXAM: RIGHT FOOT - 2 VIEW COMPARISON:  None. FINDINGS: Bones appear radiolucent. There is question of nondisplaced fractures of the second, third, and fourth metatarsal necks. Detail is limited given the osteopenia. Consider oblique view to complete the series. There is atherosclerotic calcification of the small vessels of the feet. IMPRESSION: Possible nondisplaced fractures of the second, third, and fourth metatarsal necks. Consider oblique view to complete the series. Electronically Signed   By: Nolon Nations M.D.   On: 12/23/2018 11:44   Dg C-arm 1-60 Min  Result Date: 12/21/2018 CLINICAL DATA:  Operative fixation of a right intertrochanteric fracture. EXAM: DG C-ARM 61-120 MIN; OPERATIVE RIGHT HIP WITH PELVIS COMPARISON:  Earlier today. FINDINGS: Interval rod and screw fixation of the previously demonstrated right  intertrochanteric fracture with significantly improved position and alignment. No significant displacement or angulation seen at this time. No new fractures. IMPRESSION: Hardware fixation of the previously demonstrated right intertrochanteric fracture with significantly improved position and alignment. Electronically Signed   By: Claudie Revering M.D.   On: 12/21/2018 17:37   Dg Hip Operative Unilat W Or W/o Pelvis Right  Result Date: 12/21/2018 CLINICAL DATA:  Operative fixation of a right intertrochanteric fracture. EXAM: DG C-ARM 61-120 MIN; OPERATIVE RIGHT HIP WITH PELVIS COMPARISON:  Earlier today. FINDINGS: Interval rod and screw fixation of the previously demonstrated right intertrochanteric fracture with significantly improved position and alignment. No significant displacement or angulation seen at this time. No new fractures. IMPRESSION: Hardware fixation of the previously demonstrated right intertrochanteric fracture with significantly improved position and alignment. Electronically Signed   By: Claudie Revering M.D.   On: 12/21/2018 17:37   Dg Hip Unilat W Or Wo Pelvis 2-3 Views Right  Result Date: 12/21/2018 CLINICAL DATA:  Leg pain EXAM: DG HIP (WITH OR WITHOUT PELVIS) 2-3V RIGHT COMPARISON:  None. FINDINGS: SI joints are non widened. Pubic symphysis appears intact. Probable chronic fracture deformity of the left inferior pubic ramus. Acute comminuted displaced right intertrochanteric fracture. Right femoral head projects in joint. The shaft of the femur is migrated superiorly. There are vascular calcifications. IMPRESSION: 1. Acute comminuted and displaced right intertrochanteric fracture 2. Probable chronic fracture of the left inferior pubic ramus Electronically Signed   By: Donavan Foil M.D.   On: 12/21/2018 01:26    Microbiology: Recent Results (from the past 240 hour(s))  MRSA PCR Screening     Status: None   Collection Time: 12/21/18  1:00 PM  Result Value Ref Range Status   MRSA by PCR  NEGATIVE NEGATIVE Final    Comment:        The GeneXpert MRSA Assay (FDA approved for NASAL specimens only), is one component of a comprehensive MRSA colonization surveillance program. It is not intended to diagnose MRSA infection nor to guide or monitor treatment for MRSA infections. Performed at [facility], [place] [address], [place],  [address]      Labs: Basic Metabolic Panel: Recent Labs  Lab 12/21/18 0019 12/22/18 0304 12/23/18 0207  NA 136 136 132*  K 5.0 4.1 4.2  CL 106 103 102  CO2 23 25 25   GLUCOSE 140* 137* 110*  BUN 35* 14 20  CREATININE 1.21* 0.91 1.37*  CALCIUM 8.9 8.3* 8.4*  MG  --  1.8  --    Liver Function Tests: Recent Labs  Lab 12/22/18 0304 12/23/18 0207  AST 86* 53*  ALT 64* 37  ALKPHOS 82 69  BILITOT 0.6 0.7  PROT 5.6* 5.4*  ALBUMIN 2.6* 2.3*   No results for input(s): LIPASE, AMYLASE in the last 168 hours. No results for input(s): AMMONIA in the last 168 hours. CBC: Recent Labs  Lab 12/21/18 0019 12/22/18 0304 12/23/18 0207  WBC 10.4 10.4 10.9*  NEUTROABS 7.5 9.4* 7.3  HGB 11.3* 9.3* 8.0*  HCT 35.8* 28.7* 25.4*  MCV 93.2 93.8 93.7  PLT 319 234 189   Cardiac Enzymes: No results for input(s): CKTOTAL, CKMB, CKMBINDEX, TROPONINI in the last 168 hours. BNP: BNP (last 3 results) No results for input(s): BNP in the last 8760 hours.  ProBNP (last 3 results) Recent Labs    05/02/18 1514 09/19/18 1551  PROBNP 419 485    CBG: Recent Labs  Lab 12/26/18 0802  GLUCAP 85       Signed:  Sol Blazing, MD [facility] 12/26/2018, 8:39 AM

## 2018-12-26 NOTE — Progress Notes (Signed)
Patient will DC NT:[id number] [place] Anticipated DC date:12/26/2018 Family notified:[facility] [place]:[id number]  Per MD patient ready for DC to [facility], patient, patient's family, and facility notified of DC. Discharge Summary sent to facility. RN given number for report [phone]. DC packet on chart. Ambulance transport requested for patient.  CSW signing off.  [place], [place]

## 2018-12-26 NOTE — Clinical Social Work Placement (Signed)
   CLINICAL SOCIAL WORK PLACEMENT  NOTE  Date:  12/26/2018  Patient Details  Name: [name] MRN: [id number] Date of Birth: [birth year]  Clinical Social Work is seeking post-discharge placement for this patient at the [place] level of care (*CSW will initial, date and re-position this form in  chart as items are completed):  Yes   Patient/family provided with [facility] Work Department's list of facilities offering this level of care within the geographic area requested by the patient (or if unable, by the patient's family).  Yes   Patient/family informed of their freedom to choose among providers that offer the needed level of care, that participate in Medicare, Medicaid or managed care program needed by the patient, have an available bed and are willing to accept the patient.      Patient/family informed of [facility]'s ownership interest in [facility] and [facility], as well as of the fact that they are under no obligation to receive care at these facilities.  PASRR submitted to EDS on       PASRR number received on 12/22/18     Existing PASRR number confirmed on       FL2 transmitted to all facilities in geographic area requested by pt/family on 12/22/18     FL2 transmitted to all facilities within larger geographic area on       Patient informed that his/her managed care company has contracts with or will negotiate with certain facilities, including the following:        Yes   Patient/family informed of bed offers received.  Patient chooses bed at [facility]     Physician recommends and patient chooses bed at      Patient to be transferred to [facility] on 12/26/18.  Patient to be transferred to facility by [facility]     Patient family notified on 12/26/18 of transfer.  Name of family member notified:  [name]     PHYSICIAN Please sign DNR     Additional Comment:     _______________________________________________ Alberteen Sam, LCSW 12/26/2018, 9:57 AM

## 2018-12-26 NOTE — Progress Notes (Signed)
Pt and son given oral and written discharge instructions. IV removed and pt tolerated well. EMS given report and paperwork to transport pt to SNF. Was unable to reach nurse to give report. Son states he has already spoken to receiving nurse.

## 2018-12-28 ENCOUNTER — Non-Acute Institutional Stay (SKILLED_NURSING_FACILITY): Payer: Medicare Other | Admitting: Internal Medicine

## 2018-12-28 ENCOUNTER — Encounter: Payer: Self-pay | Admitting: Internal Medicine

## 2018-12-28 DIAGNOSIS — E7800 Pure hypercholesterolemia, unspecified: Secondary | ICD-10-CM

## 2018-12-28 DIAGNOSIS — I4819 Other persistent atrial fibrillation: Secondary | ICD-10-CM | POA: Diagnosis not present

## 2018-12-28 DIAGNOSIS — I5181 Takotsubo syndrome: Secondary | ICD-10-CM | POA: Diagnosis not present

## 2018-12-28 DIAGNOSIS — I10 Essential (primary) hypertension: Secondary | ICD-10-CM

## 2018-12-28 DIAGNOSIS — E222 Syndrome of inappropriate secretion of antidiuretic hormone: Secondary | ICD-10-CM

## 2018-12-28 DIAGNOSIS — R410 Disorientation, unspecified: Secondary | ICD-10-CM

## 2018-12-28 DIAGNOSIS — S72001S Fracture of unspecified part of neck of right femur, sequela: Secondary | ICD-10-CM

## 2018-12-28 NOTE — Progress Notes (Signed)
Provider:  Veleta Miners L,MD  Location:  [facility] Room Number: 20 Place of Service:  SNF (31)  PCP: Lajean Manes, MD Patient Care Team: Lajean Manes, MD as PCP - General (Internal Medicine) [name], MD as PCP - Cardiology (Cardiology) [name], MD as Consulting Physician (Internal Medicine)  Extended Emergency Contact Information Primary Emergency Contact: Goodell,Bob Address: [address]           [place], [place] Phone: [phone] Relation: Son Secondary Emergency Contact: Philmore [place] Phone: [phone] Mobile Phone: [phone] Relation: Daughter  Code Status: DNR  Goals of Care: Advanced Directive information Advanced Directives 12/28/2018  Does Patient Have a Medical Advance Directive? No  Type of Advance Directive -  Does patient want to make changes to medical advance directive? No - Patient declined  Copy of [place] in Chart? -  Would patient like information on creating a medical advance directive? No - Patient declined  Pre-existing out of facility DNR order (yellow form or pink MOST form) -      Chief Complaint  Patient presents with   New Admit To SNF    new admission to facility     HPI: Patient is a 85 y.o. female seen today for admission to SNF for therapy. Patient was in the hospital from 03/03-03/09 for Right hip Fracture. She has a history of Takotsubo's cardiomyopathy with EF of 55 to 76%, diastolic dysfunction, hypertension, PAF not on any anticoagulation, SIADH, depression   She had mechanical Fall in her apartment. She was Found to have Right Hip Fracture. She underwent InterMedullary Implant on 03/04. Post op course was uncomplicated. She is now in SNF for therapy. Her daughter was in the room.  Per Daughter she was very confused last night. And would not go to sleep. She seems back to her baseline now. She  denies any fever , Cough Dysuria Her other complain is Severe pain in her Leg with any kind of movement . She is WBAT Patient was independent before the Fall. Her daughter helps her with Grocery . She knows her Meds. She was using Rolling walker before.  Past Medical History:  Diagnosis Date   Amputation of finger of right hand    Aortic stenosis, mild 03/03/2016   By echo 08/2015   Arthritis    Back pain    Bradycardia 08/28/2015   CKD (chronic kidney disease) stage 3, GFR 30-59 ml/min (HCC)    Closed right hip fracture (HCC) 12/2018   Depression    GERD (gastroesophageal reflux disease)    GI bleed due to NSAIDs    Hyperlipidemia    Hypertension    Macular degeneration    Maxillary sinusitis    MVP (mitral valve prolapse) 03/03/2016   Bileaflet MVP with mild MR by echo 2016   Old MI (myocardial infarction)    NSTEMI secondary to stress MI/Takotsubo CM, minimal nonobstructive ASCAD at cath   Osteoporosis    Persistent atrial fibrillation 2006   not on anticoagulation due to GI bleed and increased fall risk   PMR (polymyalgia rheumatica) (HCC)    SIADH (syndrome of inappropriate ADH production) ([place])    Takotsubo cardiomyopathy    EF normalized to 70%   Ventricular tachycardia ([facility] [facility])    on initial presentation of MI   Past Surgical History:  Procedure Laterality Date   CARDIAC CATHETERIZATION     normal coronary arteries  CHOLECYSTECTOMY  11/10/2011   Procedure: LAPAROSCOPIC CHOLECYSTECTOMY WITH INTRAOPERATIVE CHOLANGIOGRAM;  Surgeon: Pedro Earls, MD;  Location: [facility];  Service: General;  Laterality: N/A;   EYE SURGERY  06/10/11   membrane peel    INTRAMEDULLARY (IM) NAIL INTERTROCHANTERIC Right 12/21/2018   Procedure: INTRAMEDULLARY (IM) NAIL INTERTROCHANTRIC;  Surgeon: Nicholes Stairs, MD;  Location: [facility];  Service: Orthopedics;  Laterality: Right;   NASAL SINUS SURGERY Left    ROTATOR CUFF REPAIR  4008   UMBILICAL HERNIA REPAIR   11/10/2011   Procedure: HERNIA REPAIR UMBILICAL ADULT;  Surgeon: Pedro Earls, MD;  Location: [facility];  Service: General;  Laterality: N/A;   WISDOM TOOTH EXTRACTION      reports that she has never smoked. She has never used smokeless tobacco. She reports that she does not drink alcohol or use drugs. Social History   Socioeconomic History   Marital status: Married    Spouse name: Not on file   Number of children: Not on file   Years of education: Not on file   Highest education level: Not on file  Occupational History   Not on file  Social Needs   Financial resource strain: Not on file   Food insecurity:    Worry: Not on file    Inability: Not on file   Transportation needs:    Medical: Not on file    Non-medical: Not on file  Tobacco Use   Smoking status: Never Smoker   Smokeless tobacco: Never Used  Substance and Sexual Activity   Alcohol use: No    Alcohol/week: 1.0 standard drinks    Types: 1 Glasses of wine per week    Comment: occassionally   Drug use: No   Sexual activity: Not on file  Lifestyle   Physical activity:    Days per week: Not on file    Minutes per session: Not on file   Stress: Not on file  Relationships   Social connections:    Talks on phone: Not on file    Gets together: Not on file    Attends religious service: Not on file    Active member of club or organization: Not on file    Attends meetings of clubs or organizations: Not on file    Relationship status: Not on file   Intimate partner violence:    Fear of current or ex partner: Not on file    Emotionally abused: Not on file    Physically abused: Not on file    Forced sexual activity: Not on file  Other Topics Concern   Not on file  Social History Narrative   Not on file    Functional Status Survey:    Family History  Problem Relation Age of Onset   Cancer Mother     Health Maintenance  Topic Date Due   TETANUS/TDAP  02/21/1942   DEXA SCAN   02/20/1990   PNA vac Low Risk Adult (1 of 2 - PCV13) 02/20/1990   INFLUENZA VACCINE  05/19/2018    Allergies  Allergen Reactions   Amlodipine Swelling    To feet and ankles    Avelox [Moxifloxacin Hcl In Nacl] Other (See Comments)    Pt does not remember    Duragesic Disc Transdermal System [Fentanyl] Other (See Comments)    Reaction unknown   Morphine And Related Other (See Comments)    "My Sister and daughter can,t take it. I've had it before without problems."   Teriparatide (Recombinant)  Other (See Comments)    Made skin dry   Lidocaine Rash    Outpatient Encounter Medications as of 12/28/2018  Medication Sig   acetaminophen (TYLENOL) 500 MG tablet Take 500 mg by mouth every 6 (six) hours as needed for mild pain, fever or headache.   amLODipine (NORVASC) 2.5 MG tablet Take 2.5 mg by mouth daily.   amLODipine (NORVASC) 5 MG tablet Take 5 mg by mouth daily.   aspirin EC 81 MG tablet Take 81 mg by mouth daily.   azelastine (ASTELIN) 0.1 % nasal spray Place 2 sprays into both nostrils 2 (two) times daily as needed for allergies.    buPROPion (WELLBUTRIN SR) 150 MG 12 hr tablet Take 1 tablet (150 mg total) by mouth every morning.   carvedilol (COREG) 12.5 MG tablet Take 1 tablet (12.5 mg total) by mouth 2 (two) times daily with a meal.   cetirizine (ZYRTEC) 10 MG tablet Take 10 mg by mouth daily.    demeclocycline (DECLOMYCIN) 150 MG tablet Take 150 mg by mouth 2 (two) times daily.    diclofenac sodium (VOLTAREN) 1 % GEL Apply 1 application topically 4 (four) times daily as needed (pain).    feeding supplement, ENSURE ENLIVE, (ENSURE ENLIVE) LIQD Take 237 mLs by mouth 2 (two) times daily between meals.   fish oil-omega-3 fatty acids 1000 MG capsule Take 1 g by mouth daily.    furosemide (LASIX) 20 MG tablet Take 20 mg by mouth daily. Every other day   ibuprofen (ADVIL,MOTRIN) 200 MG tablet Take 200 mg by mouth every 8 (eight) hours as needed for moderate pain.     Multiple Vitamins-Minerals (CENTRUM CARDIO PO) Take 1 tablet by mouth 2 (two) times daily.    ondansetron (ZOFRAN) 4 MG tablet Take 1 tablet (4 mg total) by mouth every 6 (six) hours as needed for nausea.   polyethylene glycol (MIRALAX / GLYCOLAX) packet Take 17 g by mouth daily as needed for mild constipation.    raNITIdine HCl (ZANTAC PO) Take 1 tablet by mouth daily. 150 mg   traMADol (ULTRAM-ER) 300 MG 24 hr tablet Take 300 mg by mouth daily as needed for pain.    zinc oxide 20 % ointment Apply 1 application topically as needed for irritation. Apply to peri area as needed   [DISCONTINUED] amLODipine (NORVASC) 2.5 MG tablet TAKE 1 TABLET BY MOUTH W/ 5MG  TAB DAILY (Patient taking differently: 2.5 mg. )   [DISCONTINUED] amLODipine (NORVASC) 5 MG tablet TAKE 1 TABLET BY MOUTH EVERY DAY (Patient taking differently: Take 5 mg by mouth daily. )   [DISCONTINUED] furosemide (LASIX) 20 MG tablet Take 1 tablet (20 mg total) by mouth every other day. (Patient taking differently: Take 20 mg by mouth daily as needed for fluid. )   No facility-administered encounter medications on file as of 12/28/2018.     Review of Systems  Constitutional: Positive for activity change and fatigue.  HENT: Negative.   Respiratory: Negative.   Cardiovascular: Negative.   Gastrointestinal: Positive for constipation.  Genitourinary: Negative.   Musculoskeletal: Positive for back pain and myalgias.  Skin: Negative.   Neurological: Positive for weakness.  Psychiatric/Behavioral: Positive for confusion and sleep disturbance.    Vitals:   12/28/18 0913  BP: (!) 140/58  Pulse: 69  Resp: 18  Temp: 98.2 F (36.8 C)  SpO2: 96%  Weight: 119 lb 12.8 oz (54.3 kg)  Height: 5' (1.524 m)   Body mass index is 23.4 kg/m. Physical Exam Vitals signs reviewed.  Constitutional:      Appearance: Normal appearance.  HENT:     Head: Normocephalic.     Nose: Nose normal.     Mouth/Throat:     Mouth: Mucous  membranes are moist.     Pharynx: Oropharynx is clear.  Eyes:     Pupils: Pupils are equal, round, and reactive to light.  Neck:     Musculoskeletal: Neck supple.  Cardiovascular:     Rate and Rhythm: Normal rate and regular rhythm.     Pulses: Normal pulses.     Heart sounds: Normal heart sounds.  Pulmonary:     Effort: Pulmonary effort is normal.     Comments: Rales Bilateral at the Bases Abdominal:     General: Abdomen is flat. Bowel sounds are normal.     Palpations: Abdomen is soft.  Musculoskeletal:     Comments: Trace swelling Bilateral  Skin:    General: Skin is warm and dry.  Neurological:     Mental Status: She is alert and oriented to person, place, and time.     Comments: Has Rotator cuff injury in Both Shoulders No deficits  Psychiatric:        Mood and Affect: Mood normal.        Thought Content: Thought content normal.     Labs reviewed: Basic Metabolic Panel: Recent Labs    12/21/18 0019 12/22/18 0304 12/23/18 0207  NA 136 136 132*  K 5.0 4.1 4.2  CL 106 103 102  CO2 23 25 25   GLUCOSE 140* 137* 110*  BUN 35* 14 20  CREATININE 1.21* 0.91 1.37*  CALCIUM 8.9 8.3* 8.4*  MG  --  1.8  --    Liver Function Tests: Recent Labs    12/22/18 0304 12/23/18 0207  AST 86* 53*  ALT 64* 37  ALKPHOS 82 69  BILITOT 0.6 0.7  PROT 5.6* 5.4*  ALBUMIN 2.6* 2.3*   No results for input(s): LIPASE, AMYLASE in the last 8760 hours. No results for input(s): AMMONIA in the last 8760 hours. CBC: Recent Labs    12/21/18 0019 12/22/18 0304 12/23/18 0207  WBC 10.4 10.4 10.9*  NEUTROABS 7.5 9.4* 7.3  HGB 11.3* 9.3* 8.0*  HCT 35.8* 28.7* 25.4*  MCV 93.2 93.8 93.7  PLT 319 234 189   Cardiac Enzymes: No results for input(s): CKTOTAL, CKMB, CKMBINDEX, TROPONINI in the last 8760 hours. BNP: Invalid input(s): POCBNP No results found for: HGBA1C Lab Results  Component Value Date   TSH 2.600 09/19/2018   No results found for: VITAMINB12 No results found for:  FOLATE Lab Results  Component Value Date   IRON 26 (L) 04/28/2007   TIBC 178 (L) 04/28/2007   FERRITIN 208 04/28/2007    Imaging and Procedures obtained prior to SNF admission: Dg Chest 1 View  Result Date: 12/21/2018 CLINICAL DATA:  Leg pain EXAM: CHEST  1 VIEW COMPARISON:  07/20/2014 FINDINGS: No acute consolidation or effusion. Mild cardiomegaly with aortic atherosclerosis. No pneumothorax. IMPRESSION: No active disease.  Mild cardiomegaly Electronically Signed   By: Donavan Foil M.D.   On: 12/21/2018 01:25   Dg Lumbar Spine Complete  Result Date: 12/21/2018 CLINICAL DATA:  Leg pain EXAM: LUMBAR SPINE - COMPLETE 4+ VIEW COMPARISON:  MRI 01/02/2007 FINDINGS: Dense aortic atherosclerosis. Study limited by scoliosis and osteopenia. Multiple treated compression fractures at L3, T12 and L1 with additional treated compression deformities at the lower thoracic spine. Moderate severe compression fracture T11, chronic. Advanced degenerative change at L4-L5 and  L5-S1. Moderate severe degenerative change L3-L4. IMPRESSION: 1. Limited study secondary to scoliosis and osteopenia. 2. Multiple treated compression fractures of the thoracolumbar spine with multiple level degenerative changes, most marked at L3 through S1. No gross acute osseous abnormality. Electronically Signed   By: Donavan Foil M.D.   On: 12/21/2018 01:30   Ct Head Wo Contrast  Result Date: 12/21/2018 CLINICAL DATA:  Fall with head trauma EXAM: CT HEAD WITHOUT CONTRAST CT CERVICAL SPINE WITHOUT CONTRAST TECHNIQUE: Multidetector CT imaging of the head and cervical spine was performed following the standard protocol without intravenous contrast. Multiplanar CT image reconstructions of the cervical spine were also generated. COMPARISON:  Head CT 11/14/2005 FINDINGS: CT HEAD FINDINGS Brain: There is no mass, hemorrhage or extra-axial collection. There is generalized atrophy without lobar predilection. Areas of hypoattenuation of the deep gray  nuclei and confluent periventricular white matter hypodensity, consistent with chronic small vessel disease. Old left basal ganglia lacunar infarct. Vascular: Atherosclerotic calcification of the internal carotid arteries at the skull base. No abnormal hyperdensity of the major intracranial arteries or dural venous sinuses. Skull: Right frontoparietal scalp hematoma.  No skull fracture. Sinuses/Orbits: No fluid levels or advanced mucosal thickening of the visualized paranasal sinuses. No mastoid or middle ear effusion. The orbits are normal. CT CERVICAL SPINE FINDINGS Alignment: Grade 1 anterolisthesis at C3-4 and C4-5, likely secondary to facet arthrosis. Skull base and vertebrae: No acute fracture. Soft tissues and spinal canal: No prevertebral fluid or swelling. No visible canal hematoma. Disc levels: There is severe overgrowth at the right atlantoaxial joint with a large anterior osteophyte. Hypertrophy and calcific fragments surround the odontoid process. There is multilevel severe facet hypertrophy. No bony spinal canal stenosis. Severe left C3-4, left C4-5, right C5-6 neural foraminal stenosis. Upper chest: No pneumothorax, pulmonary nodule or pleural effusion. Other: Normal visualized paraspinal cervical soft tissues. IMPRESSION: 1. No acute intracranial abnormality. Findings of chronic small vessel disease and generalized atrophy. 2. Right frontoparietal scalp hematoma without skull fracture. 3. No acute fracture of the cervical spine. 4. Severe chronic atlantoaxial degenerative change. 5. Multilevel severe facet arthrosis with associated foraminal stenosis. Electronically Signed   By: Ulyses Jarred M.D.   On: 12/21/2018 01:19   Ct Cervical Spine Wo Contrast  Result Date: 12/21/2018 CLINICAL DATA:  Fall with head trauma EXAM: CT HEAD WITHOUT CONTRAST CT CERVICAL SPINE WITHOUT CONTRAST TECHNIQUE: Multidetector CT imaging of the head and cervical spine was performed following the standard protocol without  intravenous contrast. Multiplanar CT image reconstructions of the cervical spine were also generated. COMPARISON:  Head CT 11/14/2005 FINDINGS: CT HEAD FINDINGS Brain: There is no mass, hemorrhage or extra-axial collection. There is generalized atrophy without lobar predilection. Areas of hypoattenuation of the deep gray nuclei and confluent periventricular white matter hypodensity, consistent with chronic small vessel disease. Old left basal ganglia lacunar infarct. Vascular: Atherosclerotic calcification of the internal carotid arteries at the skull base. No abnormal hyperdensity of the major intracranial arteries or dural venous sinuses. Skull: Right frontoparietal scalp hematoma.  No skull fracture. Sinuses/Orbits: No fluid levels or advanced mucosal thickening of the visualized paranasal sinuses. No mastoid or middle ear effusion. The orbits are normal. CT CERVICAL SPINE FINDINGS Alignment: Grade 1 anterolisthesis at C3-4 and C4-5, likely secondary to facet arthrosis. Skull base and vertebrae: No acute fracture. Soft tissues and spinal canal: No prevertebral fluid or swelling. No visible canal hematoma. Disc levels: There is severe overgrowth at the right atlantoaxial joint with a large anterior osteophyte. Hypertrophy and  calcific fragments surround the odontoid process. There is multilevel severe facet hypertrophy. No bony spinal canal stenosis. Severe left C3-4, left C4-5, right C5-6 neural foraminal stenosis. Upper chest: No pneumothorax, pulmonary nodule or pleural effusion. Other: Normal visualized paraspinal cervical soft tissues. IMPRESSION: 1. No acute intracranial abnormality. Findings of chronic small vessel disease and generalized atrophy. 2. Right frontoparietal scalp hematoma without skull fracture. 3. No acute fracture of the cervical spine. 4. Severe chronic atlantoaxial degenerative change. 5. Multilevel severe facet arthrosis with associated foraminal stenosis. Electronically Signed   By:  Ulyses Jarred M.D.   On: 12/21/2018 01:19   Dg C-arm 1-60 Min  Result Date: 12/21/2018 CLINICAL DATA:  Operative fixation of a right intertrochanteric fracture. EXAM: DG C-ARM 61-120 MIN; OPERATIVE RIGHT HIP WITH PELVIS COMPARISON:  Earlier today. FINDINGS: Interval rod and screw fixation of the previously demonstrated right intertrochanteric fracture with significantly improved position and alignment. No significant displacement or angulation seen at this time. No new fractures. IMPRESSION: Hardware fixation of the previously demonstrated right intertrochanteric fracture with significantly improved position and alignment. Electronically Signed   By: Claudie Revering M.D.   On: 12/21/2018 17:37   Dg Hip Operative Unilat W Or W/o Pelvis Right  Result Date: 12/21/2018 CLINICAL DATA:  Operative fixation of a right intertrochanteric fracture. EXAM: DG C-ARM 61-120 MIN; OPERATIVE RIGHT HIP WITH PELVIS COMPARISON:  Earlier today. FINDINGS: Interval rod and screw fixation of the previously demonstrated right intertrochanteric fracture with significantly improved position and alignment. No significant displacement or angulation seen at this time. No new fractures. IMPRESSION: Hardware fixation of the previously demonstrated right intertrochanteric fracture with significantly improved position and alignment. Electronically Signed   By: Claudie Revering M.D.   On: 12/21/2018 17:37   Dg Hip Unilat W Or Wo Pelvis 2-3 Views Right  Result Date: 12/21/2018 CLINICAL DATA:  Leg pain EXAM: DG HIP (WITH OR WITHOUT PELVIS) 2-3V RIGHT COMPARISON:  None. FINDINGS: SI joints are non widened. Pubic symphysis appears intact. Probable chronic fracture deformity of the left inferior pubic ramus. Acute comminuted displaced right intertrochanteric fracture. Right femoral head projects in joint. The shaft of the femur is migrated superiorly. There are vascular calcifications. IMPRESSION: 1. Acute comminuted and displaced right intertrochanteric  fracture 2. Probable chronic fracture of the left inferior pubic ramus Electronically Signed   By: Donavan Foil M.D.   On: 12/21/2018 01:26    Assessment/Plan Delirium Not sure the cause Will check the CBC and CMP for Electrolytes Not on any New Pain Meds Closed fracture of right Hip S/P ORIF Patient is WBAT She also has non displaced Metatarsal fracture but Ortho says to treat it conservatively by wearing Boot. She is on Ultram will make it QD. She has been on higher dose by Pain clinic for her Chronic Pain  Aspirin for DVT Follow up with Ortho  Takotsubo cardiomyopathy On Lasix Will continue to monitor Weight  Atrial fibrillation (HCC) Not on any Anticoagulation On Coreg for Rate Control  Essential hypertension,  BP stable  SIADH (syndrome of inappropriate ADH production) Will recheck Sodium On Demeclocycline  Depression On wellbutrin Anemia Hgb 8  Repeat CBC Start her on Iron QOD Insomnia On Melatonin 5 mg QD   Family/ staff Communication:   Labs/tests ordered:  Total time spent in this patient care encounter was 45_ minutes; greater than 50% of the visit spent counseling patient, reviewing records , Labs and coordinating care for problems addressed at this encounter.

## 2018-12-29 LAB — BASIC METABOLIC PANEL
BUN: 24 — AB (ref 4–21)
Creatinine: 0.9 (ref 0.5–1.1)
Glucose: 81
Potassium: 4 (ref 3.4–5.3)
Sodium: 136 — AB (ref 137–147)

## 2019-01-18 ENCOUNTER — Encounter: Payer: Self-pay | Admitting: Family

## 2019-01-18 ENCOUNTER — Non-Acute Institutional Stay (SKILLED_NURSING_FACILITY): Payer: Medicare Other | Admitting: Family

## 2019-01-18 DIAGNOSIS — L89610 Pressure ulcer of right heel, unstageable: Secondary | ICD-10-CM | POA: Diagnosis not present

## 2019-01-18 NOTE — Progress Notes (Signed)
Location:  [facility] [place] Room Number: 20 Place of Service:  SNF (31) Provider:Genae Strine.NP   Lajean Manes, MD  Patient Care Team: Lajean Manes, MD as PCP - General (Internal Medicine) [name], MD as PCP - Cardiology (Cardiology) [name], MD as Consulting Physician (Internal Medicine)  Extended Emergency Contact Information Primary Emergency Contact: Kamara,Bob Address: [address]           [place], [place] Phone: [phone] Relation: Son Secondary Emergency Contact: [place] Phone: [phone] Mobile Phone: [phone] Relation: Daughter  Code Status:DNR Goals of care: Advanced Directive information Advanced Directives 01/18/2019  Does Patient Have a Medical Advance Directive? Yes  Type of Advance Directive Living will;Out of facility DNR (pink MOST or yellow form)  Does patient want to make changes to medical advance directive? No - Patient declined  Copy of [place] in Chart? -  Would patient like information on creating a medical advance directive? No - Patient declined  Pre-existing out of facility DNR order (yellow form or pink MOST form) -     Chief Complaint  Patient presents with   Acute Visit    Right heel scab that is painful     HPI:  Pt is a 85 y.o. female seen today for an acute visit for evaluation of right painful heel.she states pain worsening on right heel.Physical Therapy noted scab area during therapy.she denies any fever,chills or injury to heel.of note she is status post intermedullary implant 12/21/18 post right hip fracture.   Past Medical History:  Diagnosis Date   Amputation of finger of right hand    Aortic stenosis, mild 03/03/2016   By echo 08/2015   Arthritis    Back pain    Bradycardia 08/28/2015   CKD (chronic kidney disease) stage 3, GFR 30-59 ml/min (HCC)    Closed right hip fracture  (HCC) 12/2018   Depression    GERD (gastroesophageal reflux disease)    GI bleed due to NSAIDs    Hyperlipidemia    Hypertension    Macular degeneration    Maxillary sinusitis    MVP (mitral valve prolapse) 03/03/2016   Bileaflet MVP with mild MR by echo 2016   Old MI (myocardial infarction)    NSTEMI secondary to stress MI/Takotsubo CM, minimal nonobstructive ASCAD at cath   Osteoporosis    Persistent atrial fibrillation 2006   not on anticoagulation due to GI bleed and increased fall risk   PMR (polymyalgia rheumatica) (HCC)    SIADH (syndrome of inappropriate ADH production) ([place])    Takotsubo cardiomyopathy    EF normalized to 70%   Ventricular tachycardia ([place])    on initial presentation of MI   Past Surgical History:  Procedure Laterality Date   CARDIAC CATHETERIZATION     normal coronary arteries   CHOLECYSTECTOMY  11/10/2011   Procedure: LAPAROSCOPIC CHOLECYSTECTOMY WITH INTRAOPERATIVE CHOLANGIOGRAM;  Surgeon: Pedro Earls, MD;  Location: [facility];  Service: General;  Laterality: N/A;   EYE SURGERY  06/10/11   membrane peel    INTRAMEDULLARY (IM) NAIL INTERTROCHANTERIC Right 12/21/2018   Procedure: INTRAMEDULLARY (IM) NAIL INTERTROCHANTRIC;  Surgeon: Nicholes Stairs, MD;  Location: [facility];  Service: Orthopedics;  Laterality: Right;   NASAL SINUS SURGERY Left    ROTATOR CUFF REPAIR  [id number]   UMBILICAL HERNIA REPAIR  11/10/2011   Procedure: HERNIA REPAIR UMBILICAL ADULT;  Surgeon: Pedro Earls, MD;  Location: [facility];  Service: General;  Laterality: N/A;   WISDOM TOOTH EXTRACTION      Allergies  Allergen Reactions   Amlodipine Swelling    To feet and ankles    Avelox [Moxifloxacin Hcl In Nacl] Other (See Comments)    Pt does not remember    Biaxin [Clarithromycin]    Ceftin [Cefuroxime Axetil]    Duragesic Disc Transdermal System [Fentanyl] Other (See Comments)    Reaction unknown   Morphine And Related Other (See Comments)     "My Sister and daughter can,t take it. I've had it before without problems."   Teriparatide (Recombinant) Other (See Comments)    Made skin dry   Lidocaine Rash    Outpatient Encounter Medications as of 01/18/2019  Medication Sig   acetaminophen (TYLENOL) 500 MG tablet Take 500 mg by mouth every 6 (six) hours as needed for mild pain, fever or headache.   amLODipine (NORVASC) 2.5 MG tablet Take 2.5 mg by mouth daily.   amLODipine (NORVASC) 5 MG tablet Take 5 mg by mouth daily.   aspirin EC 81 MG tablet Take 81 mg by mouth daily.   azelastine (ASTELIN) 0.1 % nasal spray Place 2 sprays into both nostrils 2 (two) times daily as needed for allergies.    buPROPion (WELLBUTRIN SR) 150 MG 12 hr tablet Take 1 tablet (150 mg total) by mouth every morning.   carboxymethylcellul-glycerin (LUBRICATING EYE DROPS) 0.5-0.9 % ophthalmic solution Place 1 drop into both eyes 3 (three) times daily as needed for dry eyes. For allergies   carvedilol (COREG) 12.5 MG tablet Take 1 tablet (12.5 mg total) by mouth 2 (two) times daily with a meal.   cetirizine (ZYRTEC) 10 MG tablet Take 10 mg by mouth daily.    demeclocycline (DECLOMYCIN) 150 MG tablet Take 150 mg by mouth 2 (two) times daily.    diclofenac sodium (VOLTAREN) 1 % GEL Apply 1 application topically 4 (four) times daily as needed (pain).    feeding supplement, ENSURE ENLIVE, (ENSURE ENLIVE) LIQD Take 237 mLs by mouth 2 (two) times daily between meals.   Ferrous Sulfate (IRON) 325 (65 Fe) MG TABS Take by mouth daily.   fish oil-omega-3 fatty acids 1000 MG capsule Take 1 g by mouth daily.    furosemide (LASIX) 20 MG tablet Take 20 mg by mouth daily. Every other day   Melatonin 5 MG TABS Take 5 mg by mouth at bedtime as needed.   Multiple Vitamins-Minerals (CENTRUM CARDIO PO) Take 1 tablet by mouth 2 (two) times daily.    ondansetron (ZOFRAN) 4 MG tablet Take 1 tablet (4 mg total) by mouth every 6 (six) hours as needed for nausea.    polyethylene glycol (MIRALAX / GLYCOLAX) packet Take 17 g by mouth daily as needed for mild constipation.    raNITIdine HCl (ZANTAC PO) Take 1 tablet by mouth daily. 150 mg   traMADol (ULTRAM-ER) 300 MG 24 hr tablet Take 300 mg by mouth daily as needed for pain.    zinc oxide 20 % ointment Apply 1 application topically as needed for irritation. Apply to peri area as needed   [DISCONTINUED] ibuprofen (ADVIL,MOTRIN) 200 MG tablet Take 200 mg by mouth every 8 (eight) hours as needed for moderate pain.    No facility-administered encounter medications on file as of 01/18/2019.     Review of Systems  Constitutional: Negative for appetite change, chills and fatigue.  Respiratory: Negative for cough, chest tightness, shortness of breath and wheezing.  Gastrointestinal: Negative for abdominal distention, abdominal pain, constipation, diarrhea, nausea and vomiting.  Genitourinary: Negative for difficulty urinating, dysuria, flank pain, frequency and urgency.  Musculoskeletal: Positive for arthralgias and gait problem.  Skin: Negative for color change, pallor and rash.  Neurological: Negative for weakness and numbness.  Psychiatric/Behavioral: Negative for agitation, confusion and sleep disturbance. The patient is not nervous/anxious.     Immunization History  Administered Date(s) Administered   Influenza, High Dose Seasonal PF 07/12/2017   Pertinent  Health Maintenance Due  Topic Date Due   DEXA SCAN  02/20/1990   PNA vac Low Risk Adult (1 of 2 - PCV13) 02/20/1990   INFLUENZA VACCINE  05/20/2019   No flowsheet data found.  Vitals:   01/18/19 1415  BP: 138/78  Pulse: 63  Resp: 20  Temp: (!) 97.5 F (36.4 C)  SpO2: 95%  Weight: 112 lb 14.4 oz (51.2 kg)  Height: 5' (1.524 m)   Body mass index is 22.05 kg/m. Physical Exam Vitals signs and nursing note reviewed.  Constitutional:      General: She is not in acute distress.    Appearance: She is normal weight. She is not  ill-appearing.  HENT:     Mouth/Throat:     Mouth: Mucous membranes are moist.     Pharynx: Oropharynx is clear. No oropharyngeal exudate or posterior oropharyngeal erythema.  Eyes:     General: No scleral icterus.       Right eye: No discharge.        Left eye: No discharge.     Conjunctiva/sclera: Conjunctivae normal.     Pupils: Pupils are equal, round, and reactive to light.  Cardiovascular:     Rate and Rhythm: Normal rate and regular rhythm.     Pulses: Normal pulses.     Heart sounds: Normal heart sounds. No murmur. No friction rub. No gallop.   Pulmonary:     Effort: Pulmonary effort is normal. No respiratory distress.     Breath sounds: Normal breath sounds. No wheezing, rhonchi or rales.  Chest:     Chest wall: No tenderness.  Musculoskeletal:        General: No swelling or tenderness.     Comments: Unsteady gait on wheelchair during visit.bilateral lower extremities trace edema.   Skin:    General: Skin is warm and dry.     Coloration: Skin is not pale.     Findings: No erythema or rash.     Comments: Right heel white scab area surrounding skin tissue tender to touch without any redness, swelling or drainage noted.   Neurological:     Mental Status: She is alert. Mental status is at baseline.     Sensory: No sensory deficit.     Motor: No weakness.     Coordination: Coordination normal.     Gait: Gait abnormal.  Psychiatric:        Mood and Affect: Mood normal.        Behavior: Behavior normal.        Thought Content: Thought content normal.        Judgment: Judgment normal.    Labs reviewed: Recent Labs    12/21/18 0019 12/22/18 0304 12/23/18 0207  NA 136 136 132*  K 5.0 4.1 4.2  CL 106 103 102  CO2 23 25 25   GLUCOSE 140* 137* 110*  BUN 35* 14 20  CREATININE 1.21* 0.91 1.37*  CALCIUM 8.9 8.3* 8.4*  MG  --  1.8  --  Recent Labs    12/22/18 0304 12/23/18 0207  AST 86* 53*  ALT 64* 37  ALKPHOS 82 69  BILITOT 0.6 0.7  PROT 5.6* 5.4*  ALBUMIN  2.6* 2.3*   Recent Labs    12/21/18 0019 12/22/18 0304 12/23/18 0207  WBC 10.4 10.4 10.9*  NEUTROABS 7.5 9.4* 7.3  HGB 11.3* 9.3* 8.0*  HCT 35.8* 28.7* 25.4*  MCV 93.2 93.8 93.7  PLT 319 234 189   Lab Results  Component Value Date   TSH 2.600 09/19/2018    Significant Diagnostic Results in last 30 days:  Dg Chest 1 View  Result Date: 12/21/2018 CLINICAL DATA:  Leg pain EXAM: CHEST  1 VIEW COMPARISON:  07/20/2014 FINDINGS: No acute consolidation or effusion. Mild cardiomegaly with aortic atherosclerosis. No pneumothorax. IMPRESSION: No active disease.  Mild cardiomegaly Electronically Signed   By: Donavan Foil M.D.   On: 12/21/2018 01:25   Dg Lumbar Spine Complete  Result Date: 12/21/2018 CLINICAL DATA:  Leg pain EXAM: LUMBAR SPINE - COMPLETE 4+ VIEW COMPARISON:  MRI 01/02/2007 FINDINGS: Dense aortic atherosclerosis. Study limited by scoliosis and osteopenia. Multiple treated compression fractures at L3, T12 and L1 with additional treated compression deformities at the lower thoracic spine. Moderate severe compression fracture T11, chronic. Advanced degenerative change at L4-L5 and L5-S1. Moderate severe degenerative change L3-L4. IMPRESSION: 1. Limited study secondary to scoliosis and osteopenia. 2. Multiple treated compression fractures of the thoracolumbar spine with multiple level degenerative changes, most marked at L3 through S1. No gross acute osseous abnormality. Electronically Signed   By: Donavan Foil M.D.   On: 12/21/2018 01:30   Ct Head Wo Contrast  Result Date: 12/21/2018 CLINICAL DATA:  Fall with head trauma EXAM: CT HEAD WITHOUT CONTRAST CT CERVICAL SPINE WITHOUT CONTRAST TECHNIQUE: Multidetector CT imaging of the head and cervical spine was performed following the standard protocol without intravenous contrast. Multiplanar CT image reconstructions of the cervical spine were also generated. COMPARISON:  Head CT 11/14/2005 FINDINGS: CT HEAD FINDINGS Brain: There is no mass,  hemorrhage or extra-axial collection. There is generalized atrophy without lobar predilection. Areas of hypoattenuation of the deep gray nuclei and confluent periventricular white matter hypodensity, consistent with chronic small vessel disease. Old left basal ganglia lacunar infarct. Vascular: Atherosclerotic calcification of the internal carotid arteries at the skull base. No abnormal hyperdensity of the major intracranial arteries or dural venous sinuses. Skull: Right frontoparietal scalp hematoma.  No skull fracture. Sinuses/Orbits: No fluid levels or advanced mucosal thickening of the visualized paranasal sinuses. No mastoid or middle ear effusion. The orbits are normal. CT CERVICAL SPINE FINDINGS Alignment: Grade 1 anterolisthesis at C3-4 and C4-5, likely secondary to facet arthrosis. Skull base and vertebrae: No acute fracture. Soft tissues and spinal canal: No prevertebral fluid or swelling. No visible canal hematoma. Disc levels: There is severe overgrowth at the right atlantoaxial joint with a large anterior osteophyte. Hypertrophy and calcific fragments surround the odontoid process. There is multilevel severe facet hypertrophy. No bony spinal canal stenosis. Severe left C3-4, left C4-5, right C5-6 neural foraminal stenosis. Upper chest: No pneumothorax, pulmonary nodule or pleural effusion. Other: Normal visualized paraspinal cervical soft tissues. IMPRESSION: 1. No acute intracranial abnormality. Findings of chronic small vessel disease and generalized atrophy. 2. Right frontoparietal scalp hematoma without skull fracture. 3. No acute fracture of the cervical spine. 4. Severe chronic atlantoaxial degenerative change. 5. Multilevel severe facet arthrosis with associated foraminal stenosis. Electronically Signed   By: Ulyses Jarred M.D.   On: 12/21/2018  01:19   Ct Cervical Spine Wo Contrast  Result Date: 12/21/2018 CLINICAL DATA:  Fall with head trauma EXAM: CT HEAD WITHOUT CONTRAST CT CERVICAL SPINE  WITHOUT CONTRAST TECHNIQUE: Multidetector CT imaging of the head and cervical spine was performed following the standard protocol without intravenous contrast. Multiplanar CT image reconstructions of the cervical spine were also generated. COMPARISON:  Head CT 11/14/2005 FINDINGS: CT HEAD FINDINGS Brain: There is no mass, hemorrhage or extra-axial collection. There is generalized atrophy without lobar predilection. Areas of hypoattenuation of the deep gray nuclei and confluent periventricular white matter hypodensity, consistent with chronic small vessel disease. Old left basal ganglia lacunar infarct. Vascular: Atherosclerotic calcification of the internal carotid arteries at the skull base. No abnormal hyperdensity of the major intracranial arteries or dural venous sinuses. Skull: Right frontoparietal scalp hematoma.  No skull fracture. Sinuses/Orbits: No fluid levels or advanced mucosal thickening of the visualized paranasal sinuses. No mastoid or middle ear effusion. The orbits are normal. CT CERVICAL SPINE FINDINGS Alignment: Grade 1 anterolisthesis at C3-4 and C4-5, likely secondary to facet arthrosis. Skull base and vertebrae: No acute fracture. Soft tissues and spinal canal: No prevertebral fluid or swelling. No visible canal hematoma. Disc levels: There is severe overgrowth at the right atlantoaxial joint with a large anterior osteophyte. Hypertrophy and calcific fragments surround the odontoid process. There is multilevel severe facet hypertrophy. No bony spinal canal stenosis. Severe left C3-4, left C4-5, right C5-6 neural foraminal stenosis. Upper chest: No pneumothorax, pulmonary nodule or pleural effusion. Other: Normal visualized paraspinal cervical soft tissues. IMPRESSION: 1. No acute intracranial abnormality. Findings of chronic small vessel disease and generalized atrophy. 2. Right frontoparietal scalp hematoma without skull fracture. 3. No acute fracture of the cervical spine. 4. Severe chronic  atlantoaxial degenerative change. 5. Multilevel severe facet arthrosis with associated foraminal stenosis. Electronically Signed   By: Ulyses Jarred M.D.   On: 12/21/2018 01:19   Dg Foot 2 Views Right  Result Date: 12/23/2018 CLINICAL DATA:  Per patient fall about 1 week ago, reports dorsal swelling and bruising. EXAM: RIGHT FOOT - 2 VIEW COMPARISON:  None. FINDINGS: Bones appear radiolucent. There is question of nondisplaced fractures of the second, third, and fourth metatarsal necks. Detail is limited given the osteopenia. Consider oblique view to complete the series. There is atherosclerotic calcification of the small vessels of the feet. IMPRESSION: Possible nondisplaced fractures of the second, third, and fourth metatarsal necks. Consider oblique view to complete the series. Electronically Signed   By: Nolon Nations M.D.   On: 12/23/2018 11:44   Dg C-arm 1-60 Min  Result Date: 12/21/2018 CLINICAL DATA:  Operative fixation of a right intertrochanteric fracture. EXAM: DG C-ARM 61-120 MIN; OPERATIVE RIGHT HIP WITH PELVIS COMPARISON:  Earlier today. FINDINGS: Interval rod and screw fixation of the previously demonstrated right intertrochanteric fracture with significantly improved position and alignment. No significant displacement or angulation seen at this time. No new fractures. IMPRESSION: Hardware fixation of the previously demonstrated right intertrochanteric fracture with significantly improved position and alignment. Electronically Signed   By: Claudie Revering M.D.   On: 12/21/2018 17:37   Dg Hip Operative Unilat W Or W/o Pelvis Right  Result Date: 12/21/2018 CLINICAL DATA:  Operative fixation of a right intertrochanteric fracture. EXAM: DG C-ARM 61-120 MIN; OPERATIVE RIGHT HIP WITH PELVIS COMPARISON:  Earlier today. FINDINGS: Interval rod and screw fixation of the previously demonstrated right intertrochanteric fracture with significantly improved position and alignment. No significant  displacement or angulation seen at this time.  No new fractures. IMPRESSION: Hardware fixation of the previously demonstrated right intertrochanteric fracture with significantly improved position and alignment. Electronically Signed   By: Claudie Revering M.D.   On: 12/21/2018 17:37   Dg Hip Unilat W Or Wo Pelvis 2-3 Views Right  Result Date: 12/21/2018 CLINICAL DATA:  Leg pain EXAM: DG HIP (WITH OR WITHOUT PELVIS) 2-3V RIGHT COMPARISON:  None. FINDINGS: SI joints are non widened. Pubic symphysis appears intact. Probable chronic fracture deformity of the left inferior pubic ramus. Acute comminuted displaced right intertrochanteric fracture. Right femoral head projects in joint. The shaft of the femur is migrated superiorly. There are vascular calcifications. IMPRESSION: 1. Acute comminuted and displaced right intertrochanteric fracture 2. Probable chronic fracture of the left inferior pubic ramus Electronically Signed   By: Donavan Foil M.D.   On: 12/21/2018 01:26    Assessment/Plan   Unstageable pressure ulcer of right heel (HCC) Afebrile.Right heel white scab area surrounding skin tissue tender to touch without any redness, swelling or drainage noted.cleanse scab area with saline,pat dry and cover with foam dressing for protection and extra cushioning.change dressing every 3 days until scab resolves.  Family/ staff Communication: Reviewed plan of care with patient and facility Nurse.   Labs/tests ordered: None

## 2019-01-23 ENCOUNTER — Other Ambulatory Visit: Payer: Self-pay | Admitting: *Deleted

## 2019-01-23 MED ORDER — TRAMADOL HCL ER 300 MG PO TB24: 300.0000 mg | ORAL_TABLET | Freq: Every day | ORAL | 0 refills | Status: DC

## 2019-01-26 ENCOUNTER — Non-Acute Institutional Stay (SKILLED_NURSING_FACILITY): Payer: Medicare Other | Admitting: Family

## 2019-01-26 ENCOUNTER — Encounter: Payer: Self-pay | Admitting: Family

## 2019-01-26 DIAGNOSIS — I4819 Other persistent atrial fibrillation: Secondary | ICD-10-CM | POA: Diagnosis not present

## 2019-01-26 DIAGNOSIS — I5181 Takotsubo syndrome: Secondary | ICD-10-CM

## 2019-01-26 DIAGNOSIS — I10 Essential (primary) hypertension: Secondary | ICD-10-CM

## 2019-01-26 DIAGNOSIS — D649 Anemia, unspecified: Secondary | ICD-10-CM | POA: Diagnosis not present

## 2019-01-26 DIAGNOSIS — L89610 Pressure ulcer of right heel, unstageable: Secondary | ICD-10-CM

## 2019-01-26 DIAGNOSIS — E222 Syndrome of inappropriate secretion of antidiuretic hormone: Secondary | ICD-10-CM

## 2019-01-26 NOTE — Progress Notes (Signed)
Location:  [facility] Room Number: 20 Place of Service:  SNF ([phone]) Provider:Kamran Coker NP   [name], MD  Patient Care Team: [name], MD as PCP - General (Internal Medicine) [name], MD as PCP - Cardiology (Cardiology) [name], MD as Consulting Physician (Internal Medicine) Gregg Winchell, Nelda Bucks, NP as Nurse Practitioner (Family Medicine)  Extended Emergency Contact Information Primary Emergency Contact: Grosso,Bob Address: [address]           [place], [place] Phone: [phone] Relation: Son Secondary Emergency Contact: Philmore [place] Phone: [phone] Mobile Phone: [phone] Relation: Daughter  Code Status: DNR Goals of care: Advanced Directive information Advanced Directives 01/26/2019  Does Patient Have a Medical Advance Directive? Yes  Type of Advance Directive Living will;Out of facility DNR (pink MOST or yellow form)  Does patient want to make changes to medical advance directive? No - Patient declined  Copy of [place] in Chart? -  Would patient like information on creating a medical advance directive? No - Patient declined  Pre-existing out of facility DNR order (yellow form or pink MOST form) -     Chief Complaint  Patient presents with   Medical Management of Chronic Issues    routine visit     HPI:  Pt is a 85 y.o. female seen today for medical management of chronic diseases.she is seen in her room today sitting on wheelchair.she has a medical history of Hypertension, persistent AFib,Takotsubo cardiomyopathy,SIADH,GERD,Depression among other conditions.she is status post right hip IM implant 12/21/2018 post right hip fracture due to mechanical fall.she denies any acute issues during visit.she continues to work with Physical and occupational therapy for transfers and ADL's. No recent fall or abrupt weight changes.she has  a right heel scab which is being monitor by nursing.No fever or chills reported Her blood pressure log reviewed readings in the 100's/50's- 130's/70's.Labs reviewed latest Hgb 8.2 and Na+ 136 (12/29/18).she denies any confusion,headache,faintness or dizziness.     Past Medical History:  Diagnosis Date   Amputation of finger of right hand    Aortic stenosis, mild 03/03/2016   By echo 08/2015   Arthritis    Back pain    Bradycardia 08/28/2015   CKD (chronic kidney disease) stage 3, GFR 30-59 ml/min (HCC)    Closed right hip fracture (HCC) 12/2018   Depression    GERD (gastroesophageal reflux disease)    GI bleed due to NSAIDs    Hyperlipidemia    Hypertension    Macular degeneration    Maxillary sinusitis    MVP (mitral valve prolapse) 03/03/2016   Bileaflet MVP with mild MR by echo 2016   Old MI (myocardial infarction)    NSTEMI secondary to stress MI/Takotsubo CM, minimal nonobstructive ASCAD at cath   Osteoporosis    Persistent atrial fibrillation 2006   not on anticoagulation due to GI bleed and increased fall risk   PMR (polymyalgia rheumatica) (HCC)    SIADH (syndrome of inappropriate ADH production) ([facility])    Takotsubo cardiomyopathy    EF normalized to 70%   Ventricular tachycardia ([facility])    on initial presentation of MI   Past Surgical History:  Procedure Laterality Date   CARDIAC CATHETERIZATION     normal coronary arteries   CHOLECYSTECTOMY  11/10/2011   Procedure: LAPAROSCOPIC CHOLECYSTECTOMY WITH INTRAOPERATIVE CHOLANGIOGRAM;  Surgeon: Pedro Earls, MD;  Location: [facility];  Service: General;  Laterality:  N/A;   EYE SURGERY  06/10/11   membrane peel    INTRAMEDULLARY (IM) NAIL INTERTROCHANTERIC Right 12/21/2018   Procedure: INTRAMEDULLARY (IM) NAIL INTERTROCHANTRIC;  Surgeon: Nicholes Stairs, MD;  Location: [facility];  Service: Orthopedics;  Laterality: Right;   NASAL SINUS SURGERY Left    ROTATOR CUFF REPAIR  7026   UMBILICAL HERNIA  REPAIR  11/10/2011   Procedure: HERNIA REPAIR UMBILICAL ADULT;  Surgeon: Pedro Earls, MD;  Location: [facility];  Service: General;  Laterality: N/A;   WISDOM TOOTH EXTRACTION      Allergies  Allergen Reactions   Amlodipine Swelling    To feet and ankles    Avelox [Moxifloxacin Hcl In Nacl] Other (See Comments)    Pt does not remember    Biaxin [Clarithromycin]    Ceftin [Cefuroxime Axetil]    Duragesic Disc Transdermal System [Fentanyl] Other (See Comments)    Reaction unknown   Morphine And Related Other (See Comments)    "My Sister and daughter can,t take it. I've had it before without problems."   Parathyroid Hormone (Recomb) Other (See Comments)    Made skin dry   Lidocaine Rash    Outpatient Encounter Medications as of 01/26/2019  Medication Sig   acetaminophen (TYLENOL) 500 MG tablet Take 500 mg by mouth every 6 (six) hours as needed for mild pain, fever or headache.   amLODipine (NORVASC) 2.5 MG tablet Take 2.5 mg by mouth daily.   amLODipine (NORVASC) 5 MG tablet Take 5 mg by mouth daily.   aspirin EC 81 MG tablet Take 81 mg by mouth daily.   azelastine (ASTELIN) 0.1 % nasal spray Place 2 sprays into both nostrils 2 (two) times daily as needed for allergies.    buPROPion (WELLBUTRIN SR) 150 MG 12 hr tablet Take 1 tablet (150 mg total) by mouth every morning.   carboxymethylcellul-glycerin (LUBRICATING EYE DROPS) 0.5-0.9 % ophthalmic solution Place 1 drop into both eyes 3 (three) times daily as needed for dry eyes. For allergies   carvedilol (COREG) 12.5 MG tablet Take 1 tablet (12.5 mg total) by mouth 2 (two) times daily with a meal.   cetirizine (ZYRTEC) 10 MG tablet Take 10 mg by mouth daily.    demeclocycline (DECLOMYCIN) 150 MG tablet Take 150 mg by mouth 2 (two) times daily.    diclofenac sodium (VOLTAREN) 1 % GEL Apply 1 application topically 4 (four) times daily as needed (pain).    feeding supplement, ENSURE ENLIVE, (ENSURE ENLIVE) LIQD Take 237  mLs by mouth 2 (two) times daily between meals.   Ferrous Sulfate (IRON) 325 (65 Fe) MG TABS Take by mouth daily.   fish oil-omega-3 fatty acids 1000 MG capsule Take 1 g by mouth daily.    furosemide (LASIX) 20 MG tablet Take 20 mg by mouth daily. Every other day   Melatonin 5 MG TABS Take 5 mg by mouth at bedtime as needed.   ondansetron (ZOFRAN) 4 MG tablet Take 1 tablet (4 mg total) by mouth every 6 (six) hours as needed for nausea.   polyethylene glycol (MIRALAX / GLYCOLAX) packet Take 17 g by mouth daily as needed for mild constipation.    traMADol (ULTRAM-ER) 300 MG 24 hr tablet Take 1 tablet (300 mg total) by mouth daily.   zinc oxide 20 % ointment Apply 1 application topically as needed for irritation. Apply to peri area as needed   [DISCONTINUED] Multiple Vitamins-Minerals (CENTRUM CARDIO PO) Take 1 tablet by mouth 2 (two) times daily.    [  DISCONTINUED] raNITIdine HCl (ZANTAC PO) Take 1 tablet by mouth daily. 150 mg   No facility-administered encounter medications on file as of 01/26/2019.     Review of Systems  Constitutional: Negative for appetite change, chills, fatigue, fever and unexpected weight change.  HENT: Positive for hearing loss. Negative for congestion, rhinorrhea, sinus pressure, sinus pain, sneezing and sore throat.   Eyes: Negative for pain, discharge, redness and itching.  Respiratory: Negative for cough, chest tightness, shortness of breath and wheezing.   Cardiovascular: Negative for chest pain, palpitations and leg swelling.  Gastrointestinal: Negative for abdominal distention, abdominal pain, constipation, diarrhea, nausea and vomiting.  Genitourinary: Negative for difficulty urinating, dysuria, flank pain, frequency and urgency.  Musculoskeletal: Positive for gait problem.  Skin: Negative for color change, pallor and rash.       Right heel scab dressing managed by facility Nurse   Neurological: Negative for dizziness, syncope, light-headedness and  headaches.  Hematological: Does not bruise/bleed easily.  Psychiatric/Behavioral: Negative for agitation, confusion and sleep disturbance. The patient is not nervous/anxious.     Immunization History  Administered Date(s) Administered   Influenza, High Dose Seasonal PF 07/12/2017   Pertinent  Health Maintenance Due  Topic Date Due   DEXA SCAN  02/20/1990   PNA vac Low Risk Adult (1 of 2 - PCV13) 02/20/1990   INFLUENZA VACCINE  05/20/2019   No flowsheet data found.  Vitals:   01/26/19 0954  BP: (!) 109/50  Pulse: 60  Resp: 20  Temp: (!) 96.9 F (36.1 C)  SpO2: 95%  Weight: 113 lb 3.2 oz (51.3 kg)  Height: 5' (1.524 m)   Body mass index is 22.11 kg/m. Physical Exam Vitals signs and nursing note reviewed.  Constitutional:      General: She is not in acute distress.    Appearance: She is normal weight. She is not ill-appearing.  HENT:     Head: Normocephalic.     Ears:     Comments: Bilateral hearing aids in place     Nose: Nose normal. No congestion or rhinorrhea.     Mouth/Throat:     Mouth: Mucous membranes are moist.     Pharynx: Oropharynx is clear. No oropharyngeal exudate or posterior oropharyngeal erythema.  Eyes:     General: No scleral icterus.       Right eye: No discharge.        Left eye: No discharge.     Conjunctiva/sclera: Conjunctivae normal.     Pupils: Pupils are equal, round, and reactive to light.     Comments: Corrective lens in place   Neck:     Musculoskeletal: Normal range of motion. No neck rigidity or muscular tenderness.     Vascular: No carotid bruit.  Cardiovascular:     Rate and Rhythm: Normal rate and regular rhythm.     Pulses: Normal pulses.     Heart sounds: Normal heart sounds. No murmur. No friction rub. No gallop.   Pulmonary:     Effort: Pulmonary effort is normal. No respiratory distress.     Breath sounds: Normal breath sounds. No wheezing, rhonchi or rales.  Chest:     Chest wall: No tenderness.  Abdominal:      General: Bowel sounds are normal. There is no distension.     Palpations: Abdomen is soft.     Tenderness: There is no abdominal tenderness. There is no right CVA tenderness or guarding.     Hernia: A hernia is present. Hernia is present  in the umbilical area.     Comments: Umbilical hernia reducible.   Musculoskeletal:        General: No tenderness.     Comments: Unsteady gait ambulates with walker with assistance spends most time on wheelchair self propel with feet.Limited ROM to both shoulders due to arthritic pain.Arthritic changes to fingers noted. Missing right 5 th finger chronic.   Lymphadenopathy:     Cervical: No cervical adenopathy.  Skin:    General: Skin is warm and dry.     Coloration: Skin is not pale.     Findings: No erythema or rash.     Comments: 1.Right heel scab area without any signs of infections.dressing dry and clean.  2. Right hip surgical incision site has healed well.No tenderness,redness or drainage noted.  Neurological:     Mental Status: She is alert. Mental status is at baseline.     Cranial Nerves: No cranial nerve deficit.     Sensory: No sensory deficit.     Motor: No weakness.     Coordination: Coordination normal.     Gait: Gait abnormal.  Psychiatric:        Mood and Affect: Mood normal.        Speech: Speech normal.        Behavior: Behavior normal.        Thought Content: Thought content normal.        Cognition and Memory: Memory is impaired.        Judgment: Judgment normal.     Labs reviewed: Recent Labs    12/21/18 0019 12/22/18 0304 12/23/18 0207 12/29/18  NA 136 136 132* 136*  K 5.0 4.1 4.2 4.0  CL 106 103 102  --   CO2 23 25 25   --   GLUCOSE 140* 137* 110*  --   BUN 35* 14 20 24*  CREATININE 1.21* 0.91 1.37* 0.9  CALCIUM 8.9 8.3* 8.4*  --   MG  --  1.8  --   --    Recent Labs    12/22/18 0304 12/23/18 0207  AST 86* 53*  ALT 64* 37  ALKPHOS 82 69  BILITOT 0.6 0.7  PROT 5.6* 5.4*  ALBUMIN 2.6* 2.3*   Recent Labs     12/21/18 0019 12/22/18 0304 12/23/18 0207  WBC 10.4 10.4 10.9*  NEUTROABS 7.5 9.4* 7.3  HGB 11.3* 9.3* 8.0*  HCT 35.8* 28.7* 25.4*  MCV 93.2 93.8 93.7  PLT 319 234 189   Lab Results  Component Value Date   TSH 2.600 09/19/2018    Significant Diagnostic Results in last 30 days:  No results found.  Assessment/Plan  1. Essential hypertension, benign B/p reviewed soft low blood pressure readings noted. - Asymptomatic. - Will discontinue Amlodipine 2.5 mg tablet daily but continue on amlodipine 5 mg tablet,coreg 12.5 mg tablet twice daily and Furosemide 20 mg tablet daily.   2. Anemia, unspecified type Hgb 8.2 (12/29/18).continue on ferrous sulfate 325 mg tablet daily. - check CBC 01/30/19  3. Persistent atrial fibrillation (HCC) HR controlled.not on anticoagulant.continue on coreg 12.5 mg tablet twice daily with meals and ASA EC 81 mg tablet daily.   4. Takotsubo cardiomyopathy Asymptomatic.Negative exam findings.Continue on Furosemide 20 mg tablet daily.  5. SIADH (syndrome of inappropriate ADH production) (HCC) Na+ 136 ( 12/29/2018).continue on declomycin 150 mg tablet twice daily. - Repeat BMP 01/30/2019   6. Unstageable pressure ulcer of right heel (HCC) Right heel scab stable no signs or symptoms of infections.wears orthopedic open  toe shoe. Continue current scab dressing changes and monitor for worsening signs.encouraged to change position in bed to avoid lying on her back.continue protein supplements.  Family/ staff Communication:  Plan of care reviewed with patient and facility Nurse.   Labs/tests ordered:  CBC and BMP 01/30/19

## 2019-01-30 LAB — CBC AND DIFFERENTIAL
HCT: 28 — AB (ref 36–46)
Hemoglobin: 9.5 — AB (ref 12.0–16.0)
Platelets: 431 — AB (ref 150–399)
WBC: 7.6

## 2019-01-30 LAB — BASIC METABOLIC PANEL
BUN: 25 — AB (ref 4–21)
Creatinine: 1.2 — AB (ref 0.5–1.1)
Glucose: 85
Potassium: 4.7 (ref 3.4–5.3)
Sodium: 137 (ref 137–147)

## 2019-02-03 ENCOUNTER — Encounter: Payer: Self-pay | Admitting: Family

## 2019-02-03 ENCOUNTER — Non-Acute Institutional Stay (SKILLED_NURSING_FACILITY): Payer: Medicare Other | Admitting: Family

## 2019-02-03 DIAGNOSIS — W19XXXA Unspecified fall, initial encounter: Secondary | ICD-10-CM

## 2019-02-03 DIAGNOSIS — Y92129 Unspecified place in nursing home as the place of occurrence of the external cause: Secondary | ICD-10-CM | POA: Diagnosis not present

## 2019-02-03 DIAGNOSIS — L89610 Pressure ulcer of right heel, unstageable: Secondary | ICD-10-CM

## 2019-02-03 DIAGNOSIS — R2681 Unsteadiness on feet: Secondary | ICD-10-CM | POA: Diagnosis not present

## 2019-02-03 NOTE — Progress Notes (Signed)
Location:  [facility] Room Number: 20 Place of Service:  SNF [phone]) Provider: Sherilynn Dieu C,NP   [name], MD  Patient Care Team: [name], MD as PCP - General (Internal Medicine) [name], MD as PCP - Cardiology (Cardiology) [name], MD as Consulting Physician (Internal Medicine) Solstice Lastinger, Nelda Bucks, NP as Nurse Practitioner (Family Medicine)  Extended Emergency Contact Information Primary Emergency Contact: Rolf,Bob Address: [address]           [place], [place] Phone: [phone] Relation: Son Secondary Emergency Contact: Philmore Pal[place] Phone: [phone] Mobile Phone: [phone] Relation: Daughter  Code Status: DNR  Goals of care: Advanced Directive information Advanced Directives 02/03/2019  Does Patient Have a Medical Advance Directive? Yes  Type of Advance Directive Living will  Does patient want to make changes to medical advance directive? No - Patient declined  Copy of [place] in Chart? -  Would patient like information on creating a medical advance directive? No - Patient declined  Pre-existing out of facility DNR order (yellow form or pink MOST form) -     Chief Complaint  Patient presents with  . Acute Visit    fall     HPI:  Pt is a 85 y.o. female seen today for an acute visit for fall episode.she is seen in her room today per facility Nurse request.Nurse reports patient was observed sitting on the floor.No acute injuries noted.Patient states sat too close to the edge of the wheelchair sliding off the wheelchair.she denies any pain or acute injuries.she states " I did fall I slid off the chair".she continues to work with Therapy for [employer] and ADL's.of note she is status post right hip IM implant 12/21/2018 post right hip fracture due to mechanical fall. She states right heel tends to bother her more at  night.she states sleeps on her back most of the time.she has a right heel scab area current covered with heel cap for protection.she denies any pain,open ulcer  or drainage.     Past Medical History:  Diagnosis Date  . Amputation of finger of right hand   . Aortic stenosis, mild 03/03/2016   By echo 08/2015  . Arthritis   . Back pain   . Bradycardia 08/28/2015  . CKD (chronic kidney disease) stage 3, GFR 30-59 ml/min (HCC)   . Closed right hip fracture ([facility]) 12/2018  . Depression   . GERD (gastroesophageal reflux disease)   . GI bleed due to NSAIDs   . Hyperlipidemia   . Hypertension   . Macular degeneration   . Maxillary sinusitis   . MVP (mitral valve prolapse) 03/03/2016   Bileaflet MVP with mild MR by echo 2016  . Old MI (myocardial infarction)    NSTEMI secondary to stress MI/Takotsubo CM, minimal nonobstructive ASCAD at cath  . Osteoporosis   . Persistent atrial fibrillation 2006   not on anticoagulation due to GI bleed and increased fall risk  . PMR (polymyalgia rheumatica) (HCC)   . SIADH (syndrome of inappropriate ADH production) ([facility])   . Takotsubo cardiomyopathy    EF normalized to 70%  . Ventricular tachycardia ([facility])    on initial presentation of MI   Past Surgical History:  Procedure Laterality Date  . CARDIAC CATHETERIZATION     normal coronary arteries  . CHOLECYSTECTOMY  11/10/2011   Procedure: LAPAROSCOPIC CHOLECYSTECTOMY WITH INTRAOPERATIVE CHOLANGIOGRAM;  Surgeon: Pedro Earls, MD;  Location: [facility];  Service: General;  Laterality: N/A;  . EYE SURGERY  06/10/11   membrane peel   . INTRAMEDULLARY (IM) NAIL INTERTROCHANTERIC Right 12/21/2018   Procedure: INTRAMEDULLARY (IM) NAIL INTERTROCHANTRIC;  Surgeon: Nicholes Stairs, MD;  Location: [facility];  Service: Orthopedics;  Laterality: Right;  . NASAL SINUS SURGERY Left   . ROTATOR CUFF REPAIR  1998  . UMBILICAL HERNIA REPAIR  11/10/2011   Procedure: HERNIA REPAIR UMBILICAL ADULT;  Surgeon: Pedro Earls, MD;  Location: [facility];  Service: General;  Laterality: N/A;  . WISDOM TOOTH EXTRACTION      Allergies  Allergen Reactions  . Amlodipine Swelling    To feet and ankles   . Avelox [Moxifloxacin Hcl In Nacl] Other (See Comments)    Pt does not remember   . Biaxin [Clarithromycin]   . Ceftin [Cefuroxime Axetil]   . Duragesic Disc Transdermal System [Fentanyl] Other (See Comments)    Reaction unknown  . Morphine And Related Other (See Comments)    "My Sister and daughter can,t take it. I've had it before without problems."  . Parathyroid Hormone (Recomb) Other (See Comments)    Made skin dry  . Lidocaine Rash    Outpatient Encounter Medications as of 02/03/2019  Medication Sig  . acetaminophen (TYLENOL) 500 MG tablet Take 500 mg by mouth every 6 (six) hours as needed for mild pain, fever or headache.  Marland Kitchen amLODipine (NORVASC) 5 MG tablet Take 5 mg by mouth daily.  Marland Kitchen aspirin EC 81 MG tablet Take 81 mg by mouth daily.  Marland Kitchen azelastine (ASTELIN) 0.1 % nasal spray Place 2 sprays into both nostrils 2 (two) times daily as needed for allergies.   Marland Kitchen buPROPion (WELLBUTRIN SR) 150 MG 12 hr tablet Take 1 tablet (150 mg total) by mouth every morning.  . carboxymethylcellul-glycerin (LUBRICATING EYE DROPS) 0.5-0.9 % ophthalmic solution Place 1 drop into both eyes 3 (three) times daily as needed for dry eyes. For allergies  . carvedilol (COREG) 12.5 MG tablet Take 1 tablet (12.5 mg total) by mouth 2 (two) times daily with a meal.  . cetirizine (ZYRTEC) 10 MG tablet Take 10 mg by mouth daily.   Marland Kitchen demeclocycline (DECLOMYCIN) 150 MG tablet Take 150 mg by mouth 2 (two) times daily.   . diclofenac sodium (VOLTAREN) 1 % GEL Apply 1 application topically 4 (four) times daily as needed (pain).   . famotidine (PEPCID) 20 MG tablet Take 20 mg by mouth daily.  . feeding supplement, ENSURE ENLIVE, (ENSURE ENLIVE) LIQD Take 237 mLs by mouth 2 (two) times daily between meals.  . Ferrous Sulfate (IRON) 325 (65  Fe) MG TABS Take by mouth daily.  . fish oil-omega-3 fatty acids 1000 MG capsule Take 1 mg by mouth daily.   . furosemide (LASIX) 20 MG tablet Take 20 mg by mouth daily. Every other day  . Melatonin 5 MG TABS Take 5 mg by mouth at bedtime as needed.  . Multiple Vitamins-Minerals (CENTRUM CARDIO) TABS Take by mouth 2 (two) times daily.   . ondansetron (ZOFRAN) 4 MG tablet Take 1 tablet (4 mg total) by mouth every 6 (six) hours as needed for nausea.  . polyethylene glycol (MIRALAX / GLYCOLAX) packet Take 17 g by mouth daily as needed for mild constipation.   . traMADol (ULTRAM-ER) 300 MG 24 hr tablet Take 1 tablet (300 mg total) by mouth daily.  Marland Kitchen zinc oxide 20 % ointment Apply 1 application topically as needed for irritation. Apply  to peri area as needed  . amLODipine (NORVASC) 2.5 MG tablet Take 2.5 mg by mouth daily.   No facility-administered encounter medications on file as of 02/03/2019.     Review of Systems  Constitutional: Negative for appetite change, chills, fatigue, fever and unexpected weight change.  HENT: Positive for hearing loss. Negative for congestion, rhinorrhea, sinus pressure, sinus pain, sneezing and sore throat.   Eyes: Negative for discharge, redness and itching.  Respiratory: Negative for cough, chest tightness, shortness of breath and wheezing.   Cardiovascular: Negative for chest pain, palpitations and leg swelling.  Gastrointestinal: Negative for abdominal distention, abdominal pain, constipation, diarrhea, nausea and vomiting.  Endocrine: Negative for cold intolerance, heat intolerance, polydipsia, polyphagia and polyuria.  Genitourinary: Negative for difficulty urinating, dysuria, flank pain, frequency and urgency.  Musculoskeletal: Positive for gait problem.  Skin: Negative for color change, pallor and rash.  Neurological: Negative for dizziness, light-headedness and headaches.  Hematological: Does not bruise/bleed easily.  Psychiatric/Behavioral: Negative for  agitation, confusion and sleep disturbance. The patient is not nervous/anxious.     Immunization History  Administered Date(s) Administered  . Influenza, High Dose Seasonal PF 07/12/2017   Pertinent  Health Maintenance Due  Topic Date Due  . DEXA SCAN  02/20/1990  . PNA vac Low Risk Adult (1 of 2 - PCV13) 02/20/1990  . INFLUENZA VACCINE  05/20/2019   No flowsheet data found. Functional Status Survey:    Vitals:   02/03/19 1057  BP: (!) 156/61  Pulse: 70  Resp: 20  Temp: 97.8 F (36.6 C)  SpO2: 95%  Weight: 109 lb 11.2 oz (49.8 kg)  Height: 5' (1.524 m)   Body mass index is 21.42 kg/m. Physical Exam Vitals signs and nursing note reviewed.  Constitutional:      General: She is not in acute distress.    Appearance: She is normal weight. She is not ill-appearing.  HENT:     Head: Normocephalic.     Right Ear: Tympanic membrane, ear canal and external ear normal. There is no impacted cerumen.     Left Ear: Tympanic membrane, ear canal and external ear normal. There is no impacted cerumen.     Nose: No congestion or rhinorrhea.     Mouth/Throat:     Mouth: Mucous membranes are moist.     Pharynx: Oropharynx is clear. No oropharyngeal exudate or posterior oropharyngeal erythema.  Eyes:     General: No scleral icterus.       Right eye: No discharge.        Left eye: No discharge.     Conjunctiva/sclera: Conjunctivae normal.     Pupils: Pupils are equal, round, and reactive to light.  Neck:     Musculoskeletal: Normal range of motion. No neck rigidity or muscular tenderness.  Cardiovascular:     Rate and Rhythm: Normal rate and regular rhythm.     Pulses: Normal pulses.     Heart sounds: No murmur. No friction rub. No gallop.   Pulmonary:     Effort: Pulmonary effort is normal. No respiratory distress.     Breath sounds: Normal breath sounds. No wheezing, rhonchi or rales.  Chest:     Chest wall: No tenderness.  Abdominal:     General: Bowel sounds are normal.  There is no distension.     Palpations: Abdomen is soft. There is no mass.     Tenderness: There is no abdominal tenderness. There is no right CVA tenderness, left CVA tenderness, guarding or rebound.  Musculoskeletal:        General: No swelling or tenderness.     Right lower leg: No edema.     Left lower leg: No edema.     Comments: Unsteady gait spends most time on wheelchair and walks with Front wheel walker with stand by assistance.   Lymphadenopathy:     Cervical: No cervical adenopathy.  Skin:    General: Skin is warm and dry.     Coloration: Skin is not pale.     Findings: No bruising, erythema or rash.     Comments: 1.Right heel dry scab.No signs of infections slightly  tender to deep palpation. No drainage noted.  2. Right hip surgical incision site intact no tenderness or swelling noted  Neurological:     Mental Status: She is alert and oriented to person, place, and time.     Cranial Nerves: No cranial nerve deficit.     Sensory: No sensory deficit.     Motor: No weakness.     Coordination: Coordination normal.     Gait: Gait abnormal.  Psychiatric:        Mood and Affect: Mood normal.        Behavior: Behavior normal.        Thought Content: Thought content normal.        Judgment: Judgment normal.     Labs reviewed: Recent Labs    12/21/18 0019 12/22/18 0304 12/23/18 0207 12/29/18 01/30/19  NA 136 136 132* 136* 137  K 5.0 4.1 4.2 4.0 4.7  CL 106 103 102  --   --   CO2 23 25 25   --   --   GLUCOSE 140* 137* 110*  --   --   BUN 35* 14 20 24* 25*  CREATININE 1.21* 0.91 1.37* 0.9 1.2*  CALCIUM 8.9 8.3* 8.4*  --   --   MG  --  1.8  --   --   --    Recent Labs    12/22/18 0304 12/23/18 0207  AST 86* 53*  ALT 64* 37  ALKPHOS 82 69  BILITOT 0.6 0.7  PROT 5.6* 5.4*  ALBUMIN 2.6* 2.3*   Recent Labs    12/21/18 0019 12/22/18 0304 12/23/18 0207 01/30/19  WBC 10.4 10.4 10.9*  --   NEUTROABS 7.5 9.4* 7.3  --   HGB 11.3* 9.3* 8.0* 9.5*  HCT 35.8* 28.7*  25.4* 28*  MCV 93.2 93.8 93.7  --   PLT 319 234 189 431*   Lab Results  Component Value Date   TSH 2.600 09/19/2018   Significant Diagnostic Results in last 30 days:  No results found.  Assessment/Plan 1. Unsteady gait Continue to work with Therapy for ambulation and transfers.Fall and safety precautions.   2. Fall at nursing home, initial encounter Slid off her wheelchair.Wheelchair sitting position discussed with patient verbalized understanding.continue with Therapy.  3. Unstageable pressure ulcer of right heel (HCC) Right heel dry scab with slight tenderness to deep palpation.no signs of infections.Reports heel bothers her in bed at night.encourage to keep off heel from the mattress while in bed.apply offloading boot while in the bed to take off pressure to heel to prevent skin breakdown and promote healing.May discontinue offloading boot when scab resolves.  Family/ staff Communication: Reviewed plan of care with patient and facility Nurse.   Labs/tests ordered: None

## 2019-02-16 ENCOUNTER — Non-Acute Institutional Stay (SKILLED_NURSING_FACILITY): Payer: Medicare Other | Admitting: Family

## 2019-02-16 ENCOUNTER — Encounter: Payer: Self-pay | Admitting: Family

## 2019-02-16 DIAGNOSIS — R531 Weakness: Secondary | ICD-10-CM | POA: Diagnosis not present

## 2019-02-16 DIAGNOSIS — R509 Fever, unspecified: Secondary | ICD-10-CM

## 2019-02-16 LAB — CBC AND DIFFERENTIAL
HCT: 31 — AB (ref 36–46)
HCT: 31 — AB (ref 36–46)
Hemoglobin: 10.2 — AB (ref 12.0–16.0)
Hemoglobin: 10.2 — AB (ref 12.0–16.0)
Platelets: 387 (ref 150–399)
Platelets: 387 (ref 150–399)
WBC: 10.8

## 2019-02-16 NOTE — Progress Notes (Signed)
Location:  [facility] Room Number: 20 Place of Service:  SNF [phone]) Provider: Ailany Koren FNP-C  [name], MD  Patient Care Team: [name], MD as PCP - General (Internal Medicine) [name], MD as PCP - Cardiology (Cardiology) [name], MD as Consulting Physician (Internal Medicine) Rosette Bellavance, Nelda Bucks, NP as Nurse Practitioner (Family Medicine)  Extended Emergency Contact Information Primary Emergency Contact: Siedschlag,Bob Address: [address]           [place], [place] Phone: [phone] Relation: Son Secondary Emergency Contact: Philmore Pali [place] of [place] Phone: [phone] Mobile Phone: [phone] Relation: Daughter  Code Status:  DNR Goals of care: Advanced Directive information Advanced Directives 02/16/2019  Does Patient Have a Medical Advance Directive? Yes  Type of Advance Directive Living will  Does patient want to make changes to medical advance directive? No - Patient declined  Copy of [place] in Chart? -  Would patient like information on creating a medical advance directive? No - Patient declined  Pre-existing out of facility DNR order (yellow form or pink MOST form) -     Chief Complaint  Patient presents with  . Acute Visit    Fever    HPI:  Pt is a 85 y.o. female seen today at [facility] for an acute visit for evaluation of fever.she is seen in her room today per facility Nurse reports via SBAR.Nurse reports patient had a temperature 100.1 tylenol was administered per standing orders.Patient denies any acute issues.Nurse states patient required two persons assistance out of bed this morning usually able to transfer self with stand by assistance.No symptoms of URI reported.she denies any signs and symptoms of UTI during visit.  Past Medical History:  Diagnosis Date  . Amputation of finger of right hand   . Aortic stenosis,  mild 03/03/2016   By echo 08/2015  . Arthritis   . Back pain   . Bradycardia 08/28/2015  . CKD (chronic kidney disease) stage 3, GFR 30-59 ml/min (HCC)   . Closed right hip fracture ([place]) 12/2018  . Depression   . GERD (gastroesophageal reflux disease)   . GI bleed due to NSAIDs   . Hyperlipidemia   . Hypertension   . Macular degeneration   . Maxillary sinusitis   . MVP (mitral valve prolapse) 03/03/2016   Bileaflet MVP with mild MR by echo 2016  . Old MI (myocardial infarction)    NSTEMI secondary to stress MI/Takotsubo CM, minimal nonobstructive ASCAD at cath  . Osteoporosis   . Persistent atrial fibrillation 2006   not on anticoagulation due to GI bleed and increased fall risk  . PMR (polymyalgia rheumatica) (HCC)   . SIADH (syndrome of inappropriate ADH production) ([place])   . Takotsubo cardiomyopathy    EF normalized to 70%  . Ventricular tachycardia ([place])    on initial presentation of MI   Past Surgical History:  Procedure Laterality Date  . CARDIAC CATHETERIZATION     normal coronary arteries  . CHOLECYSTECTOMY  11/10/2011   Procedure: LAPAROSCOPIC CHOLECYSTECTOMY WITH INTRAOPERATIVE CHOLANGIOGRAM;  Surgeon: Pedro Earls, MD;  Location: [facility];  Service: General;  Laterality: N/A;  . EYE SURGERY  06/10/11   membrane peel   . INTRAMEDULLARY (IM) NAIL INTERTROCHANTERIC Right 12/21/2018   Procedure: INTRAMEDULLARY (IM) NAIL INTERTROCHANTRIC;  Surgeon: Nicholes Stairs, MD;  Location: [facility];  Service: Orthopedics;  Laterality: Right;  . NASAL SINUS  SURGERY Left   . ROTATOR CUFF REPAIR  1998  . UMBILICAL HERNIA REPAIR  11/10/2011   Procedure: HERNIA REPAIR UMBILICAL ADULT;  Surgeon: Pedro Earls, MD;  Location: [facility];  Service: General;  Laterality: N/A;  . WISDOM TOOTH EXTRACTION      Allergies  Allergen Reactions  . Amlodipine Swelling    To feet and ankles   . Avelox [Moxifloxacin Hcl In Nacl] Other (See Comments)    Pt does not remember   . Biaxin  [Clarithromycin]   . Ceftin [Cefuroxime Axetil]   . Duragesic Disc Transdermal System [Fentanyl] Other (See Comments)    Reaction unknown  . Morphine And Related Other (See Comments)    "My Sister and daughter can,t take it. I've had it before without problems."  . Parathyroid Hormone (Recomb) Other (See Comments)    Made skin dry  . Lidocaine Rash    Outpatient Encounter Medications as of 02/16/2019  Medication Sig  . acetaminophen (TYLENOL) 500 MG tablet Take 500 mg by mouth every 6 (six) hours as needed for mild pain, fever or headache.  Marland Kitchen amLODipine (NORVASC) 5 MG tablet Take 5 mg by mouth daily.  Marland Kitchen azelastine (ASTELIN) 0.1 % nasal spray Place 2 sprays into both nostrils 2 (two) times daily as needed for allergies.   Marland Kitchen buPROPion (WELLBUTRIN SR) 150 MG 12 hr tablet Take 1 tablet (150 mg total) by mouth every morning.  . carboxymethylcellul-glycerin (LUBRICATING EYE DROPS) 0.5-0.9 % ophthalmic solution Place 1 drop into both eyes 3 (three) times daily as needed for dry eyes. For allergies  . carvedilol (COREG) 12.5 MG tablet Take 1 tablet (12.5 mg total) by mouth 2 (two) times daily with a meal.  . cetirizine (ZYRTEC) 10 MG tablet Take 10 mg by mouth daily.   Marland Kitchen demeclocycline (DECLOMYCIN) 150 MG tablet Take 150 mg by mouth 2 (two) times daily.   . diclofenac sodium (VOLTAREN) 1 % GEL Apply 1 application topically 4 (four) times daily as needed (pain).   . famotidine (PEPCID) 20 MG tablet Take 20 mg by mouth daily.  . feeding supplement, ENSURE ENLIVE, (ENSURE ENLIVE) LIQD Take 237 mLs by mouth 2 (two) times daily between meals.  . Ferrous Sulfate (IRON) 325 (65 Fe) MG TABS Take 1 tablet by mouth every other day. Give with meals  . fish oil-omega-3 fatty acids 1000 MG capsule Take 1 mg by mouth daily.   . furosemide (LASIX) 20 MG tablet Take 20 mg by mouth daily. Every other day  . Melatonin 5 MG TABS Take 5 mg by mouth at bedtime as needed.  . Multiple Vitamins-Minerals (CENTRUM CARDIO)  TABS Take by mouth 2 (two) times daily.   . ondansetron (ZOFRAN) 4 MG tablet Take 1 tablet (4 mg total) by mouth every 6 (six) hours as needed for nausea.  . polyethylene glycol (MIRALAX / GLYCOLAX) packet Take 17 g by mouth daily as needed for mild constipation.   . traMADol (ULTRAM-ER) 300 MG 24 hr tablet Take 1 tablet (300 mg total) by mouth daily.  Marland Kitchen zinc oxide 20 % ointment Apply 1 application topically as needed for irritation. Apply to peri area as needed  . [DISCONTINUED] aspirin EC 81 MG tablet Take 81 mg by mouth daily.   No facility-administered encounter medications on file as of 02/16/2019.     Review of Systems  Constitutional: Positive for fever. Negative for appetite change, chills and fatigue.  HENT: Positive for hearing loss. Negative for congestion, rhinorrhea, sinus pressure,  sinus pain, sneezing, sore throat and trouble swallowing.   Eyes: Negative for discharge, redness and itching.  Respiratory: Negative for cough, chest tightness, shortness of breath and wheezing.   Cardiovascular: Negative for chest pain, palpitations and leg swelling.  Gastrointestinal: Negative for abdominal distention, abdominal pain, constipation, diarrhea, nausea and vomiting.  Genitourinary: Negative for difficulty urinating, dysuria, flank pain, frequency and urgency.  Musculoskeletal: Positive for gait problem.  Skin: Negative for color change, pallor and rash.  Neurological: Negative for dizziness, light-headedness and headaches.       Generalized weakness required assistance with transfer out of bed to chair.  Psychiatric/Behavioral: Negative for agitation, confusion and sleep disturbance. The patient is not nervous/anxious.     Immunization History  Administered Date(s) Administered  . Influenza, High Dose Seasonal PF 07/12/2017   Pertinent  Health Maintenance Due  Topic Date Due  . DEXA SCAN  02/20/1990  . PNA vac Low Risk Adult (1 of 2 - PCV13) 02/20/1990  . INFLUENZA VACCINE   05/20/2019   No flowsheet data found.  Vitals:   02/16/19 1057  BP: 134/72  Pulse: 75  Resp: 20  Temp: 100.1 F (37.8 C)  TempSrc: Oral  SpO2: 94%  Weight: 108 lb 4.8 oz (49.1 kg)  Height: 5' (1.524 m)   Body mass index is 21.15 kg/m. Physical Exam Vitals signs and nursing note reviewed.  Constitutional:      General: She is sleeping. She is not in acute distress.    Appearance: She is normal weight. She is not ill-appearing.     Comments: Asleepy on wheelchair holding protein supplement spilled on her pants and shoes.floor also wet spilled ? Water.easily awaken but falls asleep mid sentences.   HENT:     Head: Normocephalic.     Right Ear: Tympanic membrane, ear canal and external ear normal. There is no impacted cerumen.     Left Ear: Tympanic membrane, ear canal and external ear normal. There is no impacted cerumen.     Nose: Nose normal. No congestion or rhinorrhea.     Mouth/Throat:     Mouth: Mucous membranes are moist.     Pharynx: Oropharynx is clear. No oropharyngeal exudate or posterior oropharyngeal erythema.  Eyes:     General: No scleral icterus.       Right eye: No discharge.        Left eye: No discharge.     Conjunctiva/sclera: Conjunctivae normal.     Pupils: Pupils are equal, round, and reactive to light.  Neck:     Musculoskeletal: Normal range of motion. No neck rigidity or muscular tenderness.     Vascular: No carotid bruit.  Cardiovascular:     Rate and Rhythm: Normal rate and regular rhythm.     Pulses: Normal pulses.     Heart sounds: Normal heart sounds. No murmur. No friction rub. No gallop.   Pulmonary:     Effort: Pulmonary effort is normal. No respiratory distress.     Breath sounds: Normal breath sounds. No wheezing, rhonchi or rales.  Chest:     Chest wall: No tenderness.  Abdominal:     General: Bowel sounds are normal. There is no distension.     Palpations: Abdomen is soft. There is no mass.     Tenderness: There is no abdominal  tenderness. There is no right CVA tenderness, left CVA tenderness, guarding or rebound.     Hernia: A hernia is present.     Comments: Umbilical hernia reducible.  Musculoskeletal:     Right lower leg: No edema.     Left lower leg: No edema.     Comments: Requires assistance to transfer to wheelchair.  Lymphadenopathy:     Cervical: No cervical adenopathy.  Skin:    General: Skin is warm and dry.     Coloration: Skin is not pale.     Findings: No erythema or rash.     Comments: Unable to assess sacral skin during visit sitting on wheelchair declined to stand with two assist.   Neurological:     Mental Status: She is easily aroused. She is confused.     Cranial Nerves: Cranial nerves are intact.     Sensory: Sensation is intact.     Motor: Motor function is intact.     Gait: Gait abnormal.  Psychiatric:        Mood and Affect: Mood normal.        Speech: Speech normal.        Behavior: Behavior normal.        Thought Content: Thought content normal.        Judgment: Judgment normal.    Labs reviewed: Recent Labs    12/21/18 0019 12/22/18 0304 12/23/18 0207 12/29/18 01/30/19  NA 136 136 132* 136* 137  K 5.0 4.1 4.2 4.0 4.7  CL 106 103 102  --   --   CO2 23 25 25   --   --   GLUCOSE 140* 137* 110*  --   --   BUN 35* 14 20 24* 25*  CREATININE 1.21* 0.91 1.37* 0.9 1.2*  CALCIUM 8.9 8.3* 8.4*  --   --   MG  --  1.8  --   --   --    Recent Labs    12/22/18 0304 12/23/18 0207  AST 86* 53*  ALT 64* 37  ALKPHOS 82 69  BILITOT 0.6 0.7  PROT 5.6* 5.4*  ALBUMIN 2.6* 2.3*   Recent Labs    12/21/18 0019 12/22/18 0304 12/23/18 0207 01/30/19  WBC 10.4 10.4 10.9* 7.6  NEUTROABS 7.5 9.4* 7.3  --   HGB 11.3* 9.3* 8.0* 9.5*  HCT 35.8* 28.7* 25.4* 28*  MCV 93.2 93.8 93.7  --   PLT 319 234 189 431*   Lab Results  Component Value Date   TSH 2.600 09/19/2018    Significant Diagnostic Results in last 30 days:  No results found.  Assessment/Plan  1. Fever, unspecified  fever cause Temp 100.1 Tylenol administered by nurse per facility standing orders.change in cognition noted.Asleep on wheelchair,easily awakens but falls asleep mid -sentences.usually able to communicated well.denies any S/Sx of URI or UTI's. - CBC/diff and BMP stat  - Urine specimen for U/A and C/S rule out UTI  - continue to monitor Temp curve.Tylenol as needed.   2. Generalized weakness Requires assistance with transfer and has had change in cognition.will check CBC/diff and BMP to rule out infectious and metabolic etiologies as above.continue to assist with ADL's.   Family/ staff Communication: Reviewed plan of care with patient and facility Nurse supervisor  Labs/tests ordered:  - CBC/diff and BMP stat  - Urine specimen for U/A and C/S rule out UTI   Sandrea Hughs, NP

## 2019-02-20 ENCOUNTER — Non-Acute Institutional Stay (SKILLED_NURSING_FACILITY): Payer: Medicare Other | Admitting: Family

## 2019-02-20 ENCOUNTER — Encounter: Payer: Self-pay | Admitting: Family

## 2019-02-20 ENCOUNTER — Other Ambulatory Visit: Payer: Self-pay | Admitting: *Deleted

## 2019-02-20 DIAGNOSIS — Y92129 Unspecified place in nursing home as the place of occurrence of the external cause: Secondary | ICD-10-CM

## 2019-02-20 DIAGNOSIS — R410 Disorientation, unspecified: Secondary | ICD-10-CM | POA: Diagnosis not present

## 2019-02-20 DIAGNOSIS — D649 Anemia, unspecified: Secondary | ICD-10-CM | POA: Diagnosis not present

## 2019-02-20 DIAGNOSIS — W19XXXA Unspecified fall, initial encounter: Secondary | ICD-10-CM

## 2019-02-20 DIAGNOSIS — K219 Gastro-esophageal reflux disease without esophagitis: Secondary | ICD-10-CM

## 2019-02-20 DIAGNOSIS — I4819 Other persistent atrial fibrillation: Secondary | ICD-10-CM

## 2019-02-20 LAB — CBC AND DIFFERENTIAL
HCT: 29 — AB (ref 36–46)
Hemoglobin: 9.8 — AB (ref 12.0–16.0)
Platelets: 419 — AB (ref 150–399)

## 2019-02-20 NOTE — Progress Notes (Signed)
Location:  [facility] Room Number: 20 Place of Service:  SNF (31) Provider:  Kelby Fam, Kenshawn Maciolek  NP  [name], MD  Patient Care Team: [name], MD as PCP - General (Internal Medicine) [name], MD as PCP - Cardiology (Cardiology) [name], MD as Consulting Physician (Internal Medicine) Rakwon Letourneau, Nelda Bucks, NP as Nurse Practitioner (Family Medicine)  Extended Emergency Contact Information Primary Emergency Contact: Shells,Bob Address: [address]           [place], [place] Phone: [phone] Relation: Son Secondary Emergency Contact: Philmore Pali [place] of [place] Phone: [phone] Mobile Phone: [phone] Relation: Daughter  Code Status:  DNR Goals of care: Advanced Directive information Advanced Directives 02/20/2019  Does Patient Have a Medical Advance Directive? Yes  Type of Paramedic of [place];Living will;Out of facility DNR (pink MOST or yellow form)  Does patient want to make changes to medical advance directive? No - Patient declined  Copy of [place] in Chart? Yes - validated most recent copy scanned in chart (See row information)  Would patient like information on creating a medical advance directive? -  Pre-existing out of facility DNR order (yellow form or pink MOST form) Yellow form placed in chart (order not valid for inpatient use)     Chief Complaint  Patient presents with  . Medical Management of Chronic Issues    HPI:  Pt is a 85 y.o. female seen today for medical management of chronic diseases.she has a medical history of Hypertension,Takotsubo''s cardiomyopathy with EF 55-60%,diastolic dysfunction,PAF currently off anticoagulant,Depression,SIADH,GERD among other conditions.she sustained a fall episode on 02/18/2019 observed by nurse sitting on the floor with wheelchair behind her.she dressing her self to get in  bed. No acute injuries or pain reported .Her vital signs are stable and neuro checks within range.Nurse reports patient has had increased confusion episodes.CBC done 02/16/2019 WBC 10.8 urine specimen already send to lab awaiting results.     Past Medical History:  Diagnosis Date  . Amputation of finger of right hand   . Aortic stenosis, mild 03/03/2016   By echo 08/2015  . Arthritis   . Back pain   . Bradycardia 08/28/2015  . CKD (chronic kidney disease) stage 3, GFR 30-59 ml/min (HCC)   . Closed right hip fracture ([place]) 12/2018  . Depression   . GERD (gastroesophageal reflux disease)   . GI bleed due to NSAIDs   . Hyperlipidemia   . Hypertension   . Macular degeneration   . Maxillary sinusitis   . MVP (mitral valve prolapse) 03/03/2016   Bileaflet MVP with mild MR by echo 2016  . Old MI (myocardial infarction)    NSTEMI secondary to stress MI/Takotsubo CM, minimal nonobstructive ASCAD at cath  . Osteoporosis   . Persistent atrial fibrillation 2006   not on anticoagulation due to GI bleed and increased fall risk  . PMR (polymyalgia rheumatica) (HCC)   . SIADH (syndrome of inappropriate ADH production) ([place])   . Takotsubo cardiomyopathy    EF normalized to 70%  . Ventricular tachycardia ([place])    on initial presentation of MI   Past Surgical History:  Procedure Laterality Date  . CARDIAC CATHETERIZATION     normal coronary arteries  . CHOLECYSTECTOMY  11/10/2011   Procedure: LAPAROSCOPIC CHOLECYSTECTOMY WITH INTRAOPERATIVE CHOLANGIOGRAM;  Surgeon: Pedro Earls, MD;  [facility];  Service: General;  Laterality: N/A;  . EYE SURGERY  06/10/11   membrane peel   . INTRAMEDULLARY (IM) NAIL INTERTROCHANTERIC Right 12/21/2018   Procedure: INTRAMEDULLARY (IM) NAIL INTERTROCHANTRIC;  Surgeon: Nicholes Stairs, MD;  Location: [facility];  Service: Orthopedics;  Laterality: Right;  . NASAL SINUS SURGERY Left   . ROTATOR CUFF REPAIR  1998  . UMBILICAL HERNIA REPAIR  11/10/2011    Procedure: HERNIA REPAIR UMBILICAL ADULT;  Surgeon: Pedro Earls, MD;  [facility];  Service: General;  Laterality: N/A;  . WISDOM TOOTH EXTRACTION      Allergies  Allergen Reactions  . Amlodipine Swelling    To feet and ankles   . Avelox [Moxifloxacin Hcl In Nacl] Other (See Comments)    Pt does not remember   . Biaxin [Clarithromycin]   . Ceftin [Cefuroxime Axetil]   . Duragesic Disc Transdermal System [Fentanyl] Other (See Comments)    Reaction unknown  . Morphine And Related Other (See Comments)    "My Sister and daughter can,t take it. I've had it before without problems."  . Parathyroid Hormone (Recomb) Other (See Comments)    Made skin dry  . Lidocaine Rash    Outpatient Encounter Medications as of 02/20/2019  Medication Sig  . acetaminophen (TYLENOL) 500 MG tablet Take 500 mg by mouth every 6 (six) hours as needed for mild pain, fever or headache.  Marland Kitchen amLODipine (NORVASC) 5 MG tablet Take 5 mg by mouth daily.  Marland Kitchen azelastine (ASTELIN) 0.1 % nasal spray Place 2 sprays into both nostrils 2 (two) times daily as needed for allergies.   Marland Kitchen buPROPion (WELLBUTRIN SR) 150 MG 12 hr tablet Take 1 tablet (150 mg total) by mouth every morning.  . carboxymethylcellul-glycerin (LUBRICATING EYE DROPS) 0.5-0.9 % ophthalmic solution Place 1 drop into both eyes 3 (three) times daily as needed for dry eyes. For allergies  . carvedilol (COREG) 12.5 MG tablet Take 1 tablet (12.5 mg total) by mouth 2 (two) times daily with a meal.  . cetirizine (ZYRTEC) 10 MG tablet Take 10 mg by mouth daily.   Marland Kitchen demeclocycline (DECLOMYCIN) 150 MG tablet Take 150 mg by mouth 2 (two) times daily.   . diclofenac sodium (VOLTAREN) 1 % GEL Apply 1 application topically 4 (four) times daily as needed (pain).   . famotidine (PEPCID) 20 MG tablet Take 20 mg by mouth daily.  . Ferrous Sulfate (IRON) 325 (65 Fe) MG TABS Take 1 tablet by mouth every other day. Give with meals  . fish oil-omega-3 fatty acids 1000 MG  capsule Take 1 mg by mouth daily.   . furosemide (LASIX) 20 MG tablet Take 20 mg by mouth daily. Every other day  . lactose free nutrition (BOOST) LIQD Take 237 mLs by mouth 2 (two) times daily between meals.  . Melatonin 5 MG TABS Take 5 mg by mouth at bedtime as needed.  . Multiple Vitamins-Minerals (CENTRUM CARDIO) TABS Take by mouth 2 (two) times daily.   . ondansetron (ZOFRAN) 4 MG tablet Take 1 tablet (4 mg total) by mouth every 6 (six) hours as needed for nausea.  . OXYGEN Inhale 2 L into the lungs 3 (three) times daily as needed.  . polyethylene glycol (MIRALAX / GLYCOLAX) packet Take 17 g by mouth daily as needed for mild constipation.   . traMADol (ULTRAM-ER) 300 MG 24 hr tablet Take 1 tablet (300 mg total) by mouth daily.  Marland Kitchen zinc oxide 20 % ointment Apply 1 application topically as needed for irritation. Apply to peri area as needed  . [  DISCONTINUED] feeding supplement, ENSURE ENLIVE, (ENSURE ENLIVE) LIQD Take 237 mLs by mouth 2 (two) times daily between meals.   No facility-administered encounter medications on file as of 02/20/2019.     Review of Systems  Unable to perform ROS: Dementia (additional information provided by facility Nurse )    Immunization History  Administered Date(s) Administered  . Influenza, High Dose Seasonal PF 07/12/2017   Pertinent  Health Maintenance Due  Topic Date Due  . DEXA SCAN  02/20/1990  . PNA vac Low Risk Adult (1 of 2 - PCV13) 02/20/1990  . INFLUENZA VACCINE  05/20/2019   No flowsheet data found.  Vitals:   02/20/19 1213  BP: 136/70  Pulse: 60  Resp: 20  Temp: 97.8 F (36.6 C)  SpO2: 94%  Weight: 113 lb 9.6 oz (51.5 kg)  Height: 5' (1.524 m)   Body mass index is 22.19 kg/m. Physical Exam Vitals signs and nursing note reviewed.  Constitutional:      General: She is not in acute distress.    Appearance: She is normal weight. She is not ill-appearing.  HENT:     Head: Normocephalic.     Right Ear: Tympanic membrane, ear  canal and external ear normal. There is no impacted cerumen.     Left Ear: Tympanic membrane, ear canal and external ear normal. There is no impacted cerumen.     Nose: Nose normal. No congestion or rhinorrhea.     Mouth/Throat:     Mouth: Mucous membranes are moist.     Pharynx: Oropharynx is clear. No oropharyngeal exudate or posterior oropharyngeal erythema.  Eyes:     General: No scleral icterus.       Right eye: No discharge.        Left eye: No discharge.     Conjunctiva/sclera: Conjunctivae normal.     Pupils: Pupils are equal, round, and reactive to light.  Neck:     Musculoskeletal: Normal range of motion. No neck rigidity or muscular tenderness.  Cardiovascular:     Rate and Rhythm: Normal rate and regular rhythm.     Pulses: Normal pulses.     Heart sounds: Normal heart sounds. No murmur. No friction rub. No gallop.   Pulmonary:     Effort: Pulmonary effort is normal. No respiratory distress.     Breath sounds: Normal breath sounds. No stridor. No wheezing, rhonchi or rales.  Chest:     Chest wall: No tenderness.  Abdominal:     General: Abdomen is flat. Bowel sounds are normal. There is no distension.     Palpations: Abdomen is soft.     Tenderness: There is no abdominal tenderness. There is no right CVA tenderness, left CVA tenderness or guarding.     Hernia: A hernia is present. Hernia is present in the umbilical area.  Musculoskeletal:        General: No tenderness.     Right lower leg: No edema.     Left lower leg: No edema.     Comments: Unsteady gait.Moves x 4 extremities except chronic limited ROM to shoulders.  Lymphadenopathy:     Cervical: No cervical adenopathy.  Skin:    General: Skin is warm and dry.     Coloration: Skin is not pale.     Findings: No bruising, erythema or rash.  Neurological:     Mental Status: She is alert.     Motor: No weakness.     Gait: Gait abnormal.     Comments:  Alert and oriented to person and place but confused at  times.HOH   Psychiatric:        Mood and Affect: Mood normal.        Speech: Speech normal.        Behavior: Behavior normal.        Thought Content: Thought content normal.        Cognition and Memory: Memory is impaired.        Judgment: Judgment normal.    Labs reviewed: Recent Labs    12/21/18 0019 12/22/18 0304 12/23/18 0207 12/29/18 01/30/19  NA 136 136 132* 136* 137  K 5.0 4.1 4.2 4.0 4.7  CL 106 103 102  --   --   CO2 23 25 25   --   --   GLUCOSE 140* 137* 110*  --   --   BUN 35* 14 20 24* 25*  CREATININE 1.21* 0.91 1.37* 0.9 1.2*  CALCIUM 8.9 8.3* 8.4*  --   --   MG  --  1.8  --   --   --    Recent Labs    12/22/18 0304 12/23/18 0207  AST 86* 53*  ALT 64* 37  ALKPHOS 82 69  BILITOT 0.6 0.7  PROT 5.6* 5.4*  ALBUMIN 2.6* 2.3*   Recent Labs    12/21/18 0019 12/22/18 0304 12/23/18 0207 01/30/19 02/16/19  WBC 10.4 10.4 10.9* 7.6 10.8  NEUTROABS 7.5 9.4* 7.3  --   --   HGB 11.3* 9.3* 8.0* 9.5* 10.2*  HCT 35.8* 28.7* 25.4* 28* 31*  MCV 93.2 93.8 93.7  --   --   PLT 319 234 189 431* 387   Lab Results  Component Value Date   TSH 2.600 09/19/2018    Significant Diagnostic Results in last 30 days:  No results found.  Assessment/Plan 1. Fall at nursing home, initial encounter Afebrile.No acute injuries noted.she remains high risk for falls due to her advance age and poor safety awareness.continue to work with Therapy.Falland safety precautions.   2. Acute confusion Afebrile.Has had increased confusion. recent WBC 10.8 ( 02/16/19).U/A and C/S results pending.CXR results negative.will recheck CBC/diff today.continue to monitor Temp curve.  3. Anemia, unspecified type Recent Hgb 10.2 has improved previous 9.5;8.0 continue on ferrous sulfate.   4. Persistent atrial fibrillation (HCC) HR stable.Continue on coreg 12.5 mg tablet twice daily.off anticoagulant due to high risk for falls and GI bleed.    5. Gastroesophageal reflux disease without esophagitis  Asymptomatic.continue on famotidine 20 mg tablet daily.   Family/ staff Communication: Reviewed plan of care with patient and facility Nurse.   Labs/tests ordered: CBC/diff 02/20/2019

## 2019-02-21 ENCOUNTER — Encounter: Payer: Self-pay | Admitting: Nurse Practitioner

## 2019-02-21 DIAGNOSIS — D649 Anemia, unspecified: Secondary | ICD-10-CM | POA: Insufficient documentation

## 2019-03-03 ENCOUNTER — Encounter: Payer: Self-pay | Admitting: Internal Medicine

## 2019-03-03 ENCOUNTER — Non-Acute Institutional Stay (SKILLED_NURSING_FACILITY): Payer: Medicare Other | Admitting: Internal Medicine

## 2019-03-03 DIAGNOSIS — I5181 Takotsubo syndrome: Secondary | ICD-10-CM | POA: Diagnosis not present

## 2019-03-03 DIAGNOSIS — I4819 Other persistent atrial fibrillation: Secondary | ICD-10-CM

## 2019-03-03 DIAGNOSIS — R4189 Other symptoms and signs involving cognitive functions and awareness: Secondary | ICD-10-CM

## 2019-03-03 DIAGNOSIS — R410 Disorientation, unspecified: Secondary | ICD-10-CM | POA: Diagnosis not present

## 2019-03-03 NOTE — Progress Notes (Signed)
Location:  [facility] Room Number: 20/A Place of Service:  SNF (31) Provider:Geraldine Tesar L.MD   [name], MD  Patient Care Team: [name], MD as PCP - General (Internal Medicine) [name], MD as PCP - Cardiology (Cardiology) [name], MD as Consulting Physician (Internal Medicine) Ngetich, Nelda Bucks, NP as Nurse Practitioner (Family Medicine)  Extended Emergency Contact Information Primary Emergency Contact: Banko,Bob Address: [address]           [place], [place] Phone: [phone] Relation: Son Secondary Emergency Contact: Philmore Pal[place] Phone: [phone] Mobile Phone: [phone] Relation: Daughter  Code Status:DNR  Goals of care: Advanced Directive information Advanced Directives 03/03/2019  Does Patient Have a Medical Advance Directive? Yes  Type of Advance Directive Living will  Does patient want to make changes to medical advance directive? No - Patient declined  Copy of [place] in Chart? -  Would patient like information on creating a medical advance directive? -  Pre-existing out of facility DNR order (yellow form or pink MOST form) Yellow form placed in chart (order not valid for inpatient use)     Chief Complaint  Patient presents with  . Acute Visit    Follow up     HPI:  Pt is a 85 y.o. female seen today for an acute visit for Confusion She is SNF for therapy. She has a history of Takotsubo's cardiomyopathy with EF of 55 to 60%, diastolic dysfunction, hypertension, PAF not on any anticoagulation, SIADH, depression  She had mechanical Fall in her apartment. She was Found to have Right Hip Fracture. She underwent InterMedullary Implant on 03/04. Post op course was uncomplicated. She is now in SNF for therapy  Patient initially did well with therapy but now they are saying she is more confused. Needs lot of Cueing.  And gets confused especially in the evening. She  also had Hallucinations of seeing people in her room. She also has had 1 fall recently Patient herself did not have any acute complains She said if she does not want to do therapy no one can force me to do it. She had labs done including UA which have been negative Past Medical History:  Diagnosis Date  . Amputation of finger of right hand   . Aortic stenosis, mild 03/03/2016   By echo 08/2015  . Arthritis   . Back pain   . Bradycardia 08/28/2015  . CKD (chronic kidney disease) stage 3, GFR 30-59 ml/min (HCC)   . Closed right hip fracture ([facility]) 12/2018  . Depression   . GERD (gastroesophageal reflux disease)   . GI bleed due to NSAIDs   . Hyperlipidemia   . Hypertension   . Macular degeneration   . Maxillary sinusitis   . MVP (mitral valve prolapse) 03/03/2016   Bileaflet MVP with mild MR by echo 2016  . Old MI (myocardial infarction)    NSTEMI secondary to stress MI/Takotsubo CM, minimal nonobstructive ASCAD at cath  . Osteoporosis   . Persistent atrial fibrillation 2006   not on anticoagulation due to GI bleed and increased fall risk  . PMR (polymyalgia rheumatica) (HCC)   . SIADH (syndrome of inappropriate ADH production) ([facility])   . Takotsubo cardiomyopathy    EF normalized to 70%  . Ventricular tachycardia ([facility])    on initial presentation of MI   Past Surgical History:  Procedure Laterality Date  . CARDIAC  CATHETERIZATION     normal coronary arteries  . CHOLECYSTECTOMY  11/10/2011   Procedure: LAPAROSCOPIC CHOLECYSTECTOMY WITH INTRAOPERATIVE CHOLANGIOGRAM;  Surgeon: Pedro Earls, MD;  [facility];  Service: General;  Laterality: N/A;  . EYE SURGERY  06/10/11   membrane peel   . INTRAMEDULLARY (IM) NAIL INTERTROCHANTERIC Right 12/21/2018   Procedure: INTRAMEDULLARY (IM) NAIL INTERTROCHANTRIC;  Surgeon: Nicholes Stairs, MD;  Location: [facility];  Service: Orthopedics;  Laterality: Right;  . NASAL SINUS SURGERY Left    . ROTATOR CUFF REPAIR  1998  . UMBILICAL HERNIA REPAIR  11/10/2011   Procedure: HERNIA REPAIR UMBILICAL ADULT;  Surgeon: Pedro Earls, MD;  [facility];  Service: General;  Laterality: N/A;  . WISDOM TOOTH EXTRACTION      Allergies  Allergen Reactions  . Amlodipine Swelling    To feet and ankles   . Avelox [Moxifloxacin Hcl In Nacl] Other (See Comments)    Pt does not remember   . Biaxin [Clarithromycin]   . Ceftin [Cefuroxime Axetil]   . Duragesic Disc Transdermal System [Fentanyl] Other (See Comments)    Reaction unknown  . Morphine And Related Other (See Comments)    "My Sister and daughter can,t take it. I've had it before without problems."  . Parathyroid Hormone (Recomb) Other (See Comments)    Made skin dry  . Lidocaine Rash    Outpatient Encounter Medications as of 03/03/2019  Medication Sig  . acetaminophen (TYLENOL) 500 MG tablet Take 500 mg by mouth every 6 (six) hours as needed for mild pain, fever or headache.  Marland Kitchen amLODipine (NORVASC) 5 MG tablet Take 5 mg by mouth daily.  Marland Kitchen azelastine (ASTELIN) 0.1 % nasal spray Place 2 sprays into both nostrils 2 (two) times daily as needed for allergies.   Marland Kitchen buPROPion (WELLBUTRIN SR) 150 MG 12 hr tablet Take 1 tablet (150 mg total) by mouth every morning.  . carboxymethylcellul-glycerin (LUBRICATING EYE DROPS) 0.5-0.9 % ophthalmic solution Place 1 drop into both eyes 3 (three) times daily as needed for dry eyes. For allergies  . carvedilol (COREG) 12.5 MG tablet Take 1 tablet (12.5 mg total) by mouth 2 (two) times daily with a meal.  . cetirizine (ZYRTEC) 10 MG tablet Take 10 mg by mouth daily.   Marland Kitchen demeclocycline (DECLOMYCIN) 150 MG tablet Take 150 mg by mouth 2 (two) times daily.   . diclofenac sodium (VOLTAREN) 1 % GEL Apply 1 application topically 4 (four) times daily as needed (pain).   . famotidine (PEPCID) 20 MG tablet Take 20 mg by mouth daily.  . Ferrous Sulfate (IRON) 325 (65 Fe) MG TABS Take 1 tablet by mouth every  other day. Give with meals  . fish oil-omega-3 fatty acids 1000 MG capsule Take 1 mg by mouth daily.   . furosemide (LASIX) 20 MG tablet Take 20 mg by mouth daily. Every other day  . lactose free nutrition (BOOST) LIQD Take 237 mLs by mouth 2 (two) times daily between meals.  . Melatonin 5 MG TABS Take 5 mg by mouth at bedtime as needed.  . Multiple Vitamins-Minerals (CENTRUM CARDIO) TABS Take by mouth 2 (two) times daily.   . ondansetron (ZOFRAN) 4 MG tablet Take 1 tablet (4 mg total) by mouth every 6 (six) hours as needed for nausea.  . OXYGEN Inhale 2 L into the lungs 3 (three) times daily as needed.  . polyethylene glycol (MIRALAX / GLYCOLAX) packet Take 17 g by mouth daily as needed for mild constipation.   Marland Kitchen  traMADol (ULTRAM-ER) 300 MG 24 hr tablet Take 1 tablet (300 mg total) by mouth daily.  Marland Kitchen zinc oxide 20 % ointment Apply 1 application topically as needed for irritation. Apply to peri area as needed   No facility-administered encounter medications on file as of 03/03/2019.     Review of Systems  Constitutional: Negative.   HENT: Negative.   Respiratory: Negative.   Cardiovascular: Negative.   Gastrointestinal: Negative.   Genitourinary: Negative.   Musculoskeletal: Positive for arthralgias and gait problem.  Skin: Negative.   Neurological: Positive for weakness.  Psychiatric/Behavioral: Positive for confusion, dysphoric mood and hallucinations.    Immunization History  Administered Date(s) Administered  . Influenza, High Dose Seasonal PF 07/12/2017   Pertinent  Health Maintenance Due  Topic Date Due  . DEXA SCAN  02/20/1990  . PNA vac Low Risk Adult (1 of 2 - PCV13) 02/20/1990  . INFLUENZA VACCINE  05/20/2019   No flowsheet data found. Functional Status Survey:    Vitals:   03/03/19 0837  BP: (!) 114/58  Pulse: (!) 57  Resp: 20  Temp: 98.2 F (36.8 C)  SpO2: 93%  Weight: 113 lb 9.6 oz (51.5 kg)  Height: 5' (1.524 m)   Body mass index is 22.19 kg/m.  Physical Exam Vitals signs reviewed.  Constitutional:      Appearance: Normal appearance.  HENT:     Head: Normocephalic.     Nose: Nose normal.     Mouth/Throat:     Mouth: Mucous membranes are moist.     Pharynx: Oropharynx is clear.  Eyes:     Pupils: Pupils are equal, round, and reactive to light.  Neck:     Musculoskeletal: Neck supple.  Cardiovascular:     Rate and Rhythm: Normal rate.     Pulses: Normal pulses.     Heart sounds: Normal heart sounds.  Pulmonary:     Effort: Pulmonary effort is normal. No respiratory distress.     Breath sounds: Normal breath sounds. No wheezing or rales.  Abdominal:     General: Abdomen is flat. Bowel sounds are normal. There is no distension.     Palpations: Abdomen is soft.     Tenderness: There is no abdominal tenderness. There is no guarding.  Musculoskeletal:     Comments: Mild swelling Bilateral  Skin:    General: Skin is warm and dry.  Neurological:     General: No focal deficit present.     Mental Status: She is alert.     Comments: Patient was alert and Oriented. Knew the president  Psychiatric:        Mood and Affect: Mood normal.        Thought Content: Thought content normal.        Judgment: Judgment normal.     Labs reviewed: Recent Labs    12/21/18 0019 12/22/18 0304 12/23/18 0207 12/29/18 01/30/19  NA 136 136 132* 136* 137  K 5.0 4.1 4.2 4.0 4.7  CL 106 103 102  --   --   CO2 23 25 25   --   --   GLUCOSE 140* 137* 110*  --   --   BUN 35* 14 20 24* 25*  CREATININE 1.21* 0.91 1.37* 0.9 1.2*  CALCIUM 8.9 8.3* 8.4*  --   --   MG  --  1.8  --   --   --    Recent Labs    12/22/18 0304 12/23/18 0207  AST 86* 53*  ALT  64* 37  ALKPHOS 82 69  BILITOT 0.6 0.7  PROT 5.6* 5.4*  ALBUMIN 2.6* 2.3*   Recent Labs    12/21/18 0019 12/22/18 0304 12/23/18 0207 01/30/19 02/16/19  WBC 10.4 10.4 10.9* 7.6 10.8  NEUTROABS 7.5 9.4* 7.3  --   --   HGB 11.3* 9.3* 8.0* 9.5* 10.2*  HCT 35.8* 28.7* 25.4* 28* 31*   MCV 93.2 93.8 93.7  --   --   PLT 319 234 189 431* 387   Lab Results  Component Value Date   TSH 2.600 09/19/2018   No results found for: HGBA1C No results found for: CHOL, HDL, LDLCALC, LDLDIRECT, TRIG, CHOLHDL  Significant Diagnostic Results in last 30 days:  No results found.  Assessment/Plan Cognitive Impairnamnet' with Change is status With Recent Worsening Will get Chest Xray  UA has been negative She does have Vascualr dementia Her CT scan in hospital had not shown anything acute but she had Chronic Vascualr disese Will also Reduce her Ultram to slowly to 100 mg QD on case if that is contributing to her confusion Family aware that patient would not [place] able to go to [place]. She will continue to need help   Closed fracture of right Hip S/P ORIF Patient is WBAT Per therapy she was doing well before but is not making much progress now Aspirin for DVT Follow up with Ortho  Takotsubo cardiomyopathy On Lasix She has gained some weight but looks Euvolemic   Atrial fibrillation (HCC) Not on any Anticoagulation due to h/o GI bleed On Coreg for Rate Control  Essential hypertension,  BP stable  SIADH (syndrome of inappropriate ADH production) Sodium Stable on Demclocycline Depression On Wellbutrin Anemia Low but stable on Iron Insomnia On Melatonin 5 mg QD    Family/ staff Communication:   Labs/tests ordered:   Total time spent in this patient care encounter was  25_  minutes; greater than 50% of the visit spent counseling patient and staff, reviewing records , Labs and coordinating care for problems addressed at this encounter.

## 2019-03-04 DIAGNOSIS — R4189 Other symptoms and signs involving cognitive functions and awareness: Secondary | ICD-10-CM | POA: Insufficient documentation

## 2019-03-04 DIAGNOSIS — R410 Disorientation, unspecified: Secondary | ICD-10-CM | POA: Insufficient documentation

## 2019-03-06 ENCOUNTER — Telehealth: Payer: Self-pay | Admitting: Cardiology

## 2019-03-06 NOTE — Telephone Encounter (Signed)
New Message:  [name] wanted Dr Radford Pax to know she had to cancel pt's appt for this week. Pt had a fall and is now in [facility].

## 2019-03-08 ENCOUNTER — Encounter: Payer: Self-pay | Admitting: Internal Medicine

## 2019-03-08 ENCOUNTER — Non-Acute Institutional Stay (SKILLED_NURSING_FACILITY): Payer: Medicare Other | Admitting: Internal Medicine

## 2019-03-08 DIAGNOSIS — R410 Disorientation, unspecified: Secondary | ICD-10-CM

## 2019-03-08 DIAGNOSIS — E222 Syndrome of inappropriate secretion of antidiuretic hormone: Secondary | ICD-10-CM | POA: Diagnosis not present

## 2019-03-08 DIAGNOSIS — I5181 Takotsubo syndrome: Secondary | ICD-10-CM | POA: Diagnosis not present

## 2019-03-08 DIAGNOSIS — I4819 Other persistent atrial fibrillation: Secondary | ICD-10-CM

## 2019-03-08 NOTE — Progress Notes (Signed)
Location:  [facility] Room Number: 20/A Place of Service:  SNF [phone]) Provider: Veleta Miners L,MD   [name], MD  Patient Care Team: [name], MD as PCP - General (Internal Medicine) [name], MD as PCP - Cardiology (Cardiology) [name], MD as Consulting Physician (Internal Medicine) Ngetich, Nelda Bucks, NP as Nurse Practitioner (Family Medicine)  Extended Emergency Contact Information Primary Emergency Contact: Zurawski,Bob Address: [address]           [place], [place] Phone: [phone] Relation: Son Secondary Emergency Contact: Philmore [place] Phone: [phone] Mobile Phone: [phone] Relation: Daughter  Code Status: DNR  Goals of care: Advanced Directive information Advanced Directives 03/08/2019  Does Patient Have a Medical Advance Directive? Yes  Type of Advance Directive Living will;Out of facility DNR (pink MOST or yellow form)  Does patient want to make changes to medical advance directive? No - Patient declined  Copy of [place] in Chart? -  Would patient like information on creating a medical advance directive? No - Patient declined  Pre-existing out of facility DNR order (yellow form or pink MOST form) Yellow form placed in chart (order not valid for inpatient use)     Chief Complaint  Patient presents with  . Acute Visit    Confusion     HPI:  Pt is a 85 y.o. female seen today for an acute visit for Persistnet confusion.  She is SNF for therapy. She has a history of Takotsubo's cardiomyopathy with EF of 55 to 77%, diastolic dysfunction, hypertension, PAF not on any anticoagulation, SIADH, depression  She had mechanical Fall in her apartment. She was Found to have Right Hip Fracture. She underwent InterMedullary Implant on 03/04. Post op course was uncomplicated. She is now in SNF for therapy.  Patient initially did  well with therapy but now per therapy and her nurse she continues to have a lot of confusion especially in the evening.  She told the nurse few days ago that she has a lady in her room who is dying and we should call ambulance. Patient herself again today did not have any complaints.  She denies any fever cough.  She does complain of some pain in her hip.  She also says she gets tired and does not want to do more therapy.    Past Medical History:  Diagnosis Date  . Amputation of finger of right hand   . Aortic stenosis, mild 03/03/2016   By echo 08/2015  . Arthritis   . Back pain   . Bradycardia 08/28/2015  . CKD (chronic kidney disease) stage 3, GFR 30-59 ml/min (HCC)   . Closed right hip fracture ([facility]) 12/2018  . Depression   . GERD (gastroesophageal reflux disease)   . GI bleed due to NSAIDs   . Hyperlipidemia   . Hypertension   . Macular degeneration   . Maxillary sinusitis   . MVP (mitral valve prolapse) 03/03/2016   Bileaflet MVP with mild MR by echo 2016  . Old MI (myocardial infarction)    NSTEMI secondary to stress MI/Takotsubo CM, minimal nonobstructive ASCAD at cath  . Osteoporosis   . Persistent atrial fibrillation 2006   not on anticoagulation due to GI bleed and increased fall risk  . PMR (polymyalgia rheumatica) (HCC)   . SIADH (syndrome of inappropriate ADH production) ([facility])   . Takotsubo cardiomyopathy    EF normalized to  70%  . Ventricular tachycardia ([facility])    on initial presentation of MI   Past Surgical History:  Procedure Laterality Date  . CARDIAC CATHETERIZATION     normal coronary arteries  . CHOLECYSTECTOMY  11/10/2011   Procedure: LAPAROSCOPIC CHOLECYSTECTOMY WITH INTRAOPERATIVE CHOLANGIOGRAM;  Surgeon: Pedro Earls, MD;  [facility];  Service: General;  Laterality: N/A;  . EYE SURGERY  06/10/11   membrane peel   . INTRAMEDULLARY (IM) NAIL INTERTROCHANTERIC Right 12/21/2018   Procedure: INTRAMEDULLARY (IM) NAIL INTERTROCHANTRIC;  Surgeon:  Nicholes Stairs, MD;  Location: [facility];  Service: Orthopedics;  Laterality: Right;  . NASAL SINUS SURGERY Left   . ROTATOR CUFF REPAIR  1998  . UMBILICAL HERNIA REPAIR  11/10/2011   Procedure: HERNIA REPAIR UMBILICAL ADULT;  Surgeon: Pedro Earls, MD;  [facility];  Service: General;  Laterality: N/A;  . WISDOM TOOTH EXTRACTION      Allergies  Allergen Reactions  . Amlodipine Swelling    To feet and ankles   . Avelox [Moxifloxacin Hcl In Nacl] Other (See Comments)    Pt does not remember   . Biaxin [Clarithromycin]   . Ceftin [Cefuroxime Axetil]   . Duragesic Disc Transdermal System [Fentanyl] Other (See Comments)    Reaction unknown  . Morphine And Related Other (See Comments)    "My Sister and daughter can,t take it. I've had it before without problems."  . Parathyroid Hormone (Recomb) Other (See Comments)    Made skin dry  . Lidocaine Rash    Outpatient Encounter Medications as of 03/08/2019  Medication Sig  . acetaminophen (TYLENOL) 500 MG tablet Take 500 mg by mouth every 6 (six) hours as needed for mild pain, fever or headache.  Marland Kitchen amLODipine (NORVASC) 5 MG tablet Take 5 mg by mouth daily.  Marland Kitchen azelastine (ASTELIN) 0.1 % nasal spray Place 2 sprays into both nostrils 2 (two) times daily as needed for allergies.   Marland Kitchen buPROPion (WELLBUTRIN SR) 150 MG 12 hr tablet Take 1 tablet (150 mg total) by mouth every morning.  . carboxymethylcellul-glycerin (LUBRICATING EYE DROPS) 0.5-0.9 % ophthalmic solution Place 1 drop into both eyes 3 (three) times daily as needed for dry eyes. For allergies  . carvedilol (COREG) 12.5 MG tablet Take 1 tablet (12.5 mg total) by mouth 2 (two) times daily with a meal.  . cetirizine (ZYRTEC) 10 MG tablet Take 10 mg by mouth daily.   Marland Kitchen demeclocycline (DECLOMYCIN) 150 MG tablet Take 150 mg by mouth 2 (two) times daily.   . diclofenac sodium (VOLTAREN) 1 % GEL Apply 1 application topically 4 (four) times daily as needed (pain).   . famotidine  (PEPCID) 20 MG tablet Take 20 mg by mouth daily.  . Ferrous Sulfate (IRON) 325 (65 Fe) MG TABS Take 1 tablet by mouth every other day. Give with meals  . fish oil-omega-3 fatty acids 1000 MG capsule Take 1 mg by mouth daily.   . furosemide (LASIX) 20 MG tablet Take 20 mg by mouth daily. Every other day  . lactose free nutrition (BOOST) LIQD Take 237 mLs by mouth 2 (two) times daily between meals.  . Melatonin 5 MG TABS Take 5 mg by mouth at bedtime as needed.  . Multiple Vitamins-Minerals (CENTRUM CARDIO) TABS Take by mouth 2 (two) times daily.   . ondansetron (ZOFRAN) 4 MG tablet Take 1 tablet (4 mg total) by mouth every 6 (six) hours as needed for nausea.  . OXYGEN Inhale 2 L into the  lungs 3 (three) times daily as needed.  . polyethylene glycol (MIRALAX / GLYCOLAX) packet Take 17 g by mouth daily as needed for mild constipation.   . traMADol (ULTRAM-ER) 300 MG 24 hr tablet Take 1 tablet (300 mg total) by mouth daily. (Patient taking differently: Take 300 mg by mouth daily. Take 200 once a morning and 100mg  once a morning)  . zinc oxide 20 % ointment Apply 1 application topically as needed for irritation. Apply to peri area as needed  . [DISCONTINUED] Carboxymethylcellulose Sod PF (LUBRICATING PLUS EYE DROPS) 0.5 % SOLN Apply 1 drop to eye 3 (three) times daily. Both eyes   No facility-administered encounter medications on file as of 03/08/2019.     Review of Systems  Musculoskeletal: Positive for arthralgias.  Neurological: Positive for weakness.  Psychiatric/Behavioral: Positive for confusion.  All other systems reviewed and are negative.   Immunization History  Administered Date(s) Administered  . Influenza, High Dose Seasonal PF 07/12/2017   Pertinent  Health Maintenance Due  Topic Date Due  . DEXA SCAN  02/20/1990  . PNA vac Low Risk Adult (1 of 2 - PCV13) 02/20/1990  . INFLUENZA VACCINE  05/20/2019   No flowsheet data found. Functional Status Survey:    Vitals:    03/08/19 1033  BP: (!) 142/62  Pulse: 68  Resp: (!) 21  Temp: (!) 97.4 F (36.3 C)  SpO2: 96%  Weight: 113 lb 9.6 oz (51.5 kg)  Height: 5' (1.524 m)   Body mass index is 22.19 kg/m. Physical Exam Vitals signs reviewed.  Constitutional:      Appearance: Normal appearance.  HENT:     Head: Normocephalic.     Nose: Nose normal.     Mouth/Throat:     Mouth: Mucous membranes are moist.     Pharynx: Oropharynx is clear.  Eyes:     Pupils: Pupils are equal, round, and reactive to light.  Neck:     Musculoskeletal: Neck supple.  Cardiovascular:     Rate and Rhythm: Normal rate.     Pulses: Normal pulses.     Heart sounds: Normal heart sounds.  Pulmonary:     Effort: Pulmonary effort is normal. No respiratory distress.     Breath sounds: Normal breath sounds. No wheezing or rales.  Abdominal:     General: Abdomen is flat. Bowel sounds are normal. There is no distension.     Palpations: Abdomen is soft.     Tenderness: There is no abdominal tenderness. There is no guarding.  Musculoskeletal:     Comments: Mild swelling Bilateral  Skin:    General: Skin is warm and dry.  Neurological:     General: No focal deficit present.     Mental Status: She is alert.     Comments: Patient was alert and Oriented. Knew the president Does nto remember having halluciantions  Psychiatric:        Mood and Affect: Mood normal.        Thought Content: Thought content normal.        Judgment: Judgment normal.     Labs reviewed: Recent Labs    12/21/18 0019 12/22/18 0304 12/23/18 0207 12/29/18 01/30/19  NA 136 136 132* 136* 137  K 5.0 4.1 4.2 4.0 4.7  CL 106 103 102  --   --   CO2 23 25 25   --   --   GLUCOSE 140* 137* 110*  --   --   BUN 35* 14 20 24* 25*  CREATININE 1.21* 0.91 1.37* 0.9 1.2*  CALCIUM 8.9 8.3* 8.4*  --   --   MG  --  1.8  --   --   --    Recent Labs    12/22/18 0304 12/23/18 0207  AST 86* 53*  ALT 64* 37  ALKPHOS 82 69  BILITOT 0.6 0.7  PROT 5.6* 5.4*   ALBUMIN 2.6* 2.3*   Recent Labs    12/21/18 0019 12/22/18 0304 12/23/18 0207 01/30/19 02/16/19 02/20/19  WBC 10.4 10.4 10.9* 7.6 10.8  --   NEUTROABS 7.5 9.4* 7.3  --   --   --   HGB 11.3* 9.3* 8.0* 9.5* 10.2*  10.2* 9.8*  HCT 35.8* 28.7* 25.4* 28* 31*  31* 29*  MCV 93.2 93.8 93.7  --   --   --   PLT 319 234 189 431* 387  387 419*   Lab Results  Component Value Date   TSH 2.600 09/19/2018   No results found for: HGBA1C No results found for: CHOL, HDL, LDLCALC, LDLDIRECT, TRIG, CHOLHDL  Significant Diagnostic Results in last 30 days:  No results found.  Assessment/Plan  Cognitive Impairnamnet' with Change is status With Recent Worsening. Continues to have Hallucinations especially in Evenining Chest Xray showed Chronic changes No Acute Infection UA has been negative She does have Vascualr dementia Her CT scan in hospital had not shown anything acute but she had Chronic Vascualr disese Reducing her Ultram to slowly to 100 mg QD on case if that is contributing to her confusion Also Will start her on Namenda to see it helps with her behavior, Talked with Nurses that this can be Chronic issue with her and would continue Redirection then any Antipsychotic for now I would not recommend Aggressive work up  Other issues are stable Closed fracture of rightHip S/P ORIF Patient is WBAT Per therapy she was doing well before but is not making much progress now Aspirin for DVT Follow up with Ortho  Takotsubo cardiomyopathy On Lasix QOD She has gained some weight but looks Euvolemic   Atrial fibrillation (HCC) Not on any Anticoagulation due to h/o GI bleed On Coreg for Rate Control  Essential hypertension, BP stable on Amlodipine  SIADH (syndrome of inappropriate ADH production) Sodium Stable on Demclocycline Depression On Wellbutrin Anemia Low but stable on Iron Insomnia On Melatonin 5 mg QD    Family/ staff Communication:  Labs/tests ordered:   Repeat Chest Xray in 2 weeks for follow up

## 2019-03-08 NOTE — Telephone Encounter (Signed)
Spoke with the daughter, the patient cannot come to her appointment. She will call later to reschedule.

## 2019-03-21 ENCOUNTER — Ambulatory Visit: Payer: Medicare Other | Admitting: Cardiology

## 2019-03-22 ENCOUNTER — Non-Acute Institutional Stay (SKILLED_NURSING_FACILITY): Payer: Medicare Other | Admitting: Internal Medicine

## 2019-03-22 DIAGNOSIS — I5181 Takotsubo syndrome: Secondary | ICD-10-CM

## 2019-03-22 DIAGNOSIS — E222 Syndrome of inappropriate secretion of antidiuretic hormone: Secondary | ICD-10-CM | POA: Diagnosis not present

## 2019-03-22 DIAGNOSIS — I4819 Other persistent atrial fibrillation: Secondary | ICD-10-CM

## 2019-03-22 DIAGNOSIS — J189 Pneumonia, unspecified organism: Secondary | ICD-10-CM

## 2019-03-22 NOTE — Progress Notes (Signed)
Location:  [place] of Service:  SNF [phone]) Provider: Veleta Miners L,MD   [name], MD  Patient Care Team: [name], MD as PCP - General (Internal Medicine) [name], MD as PCP - Cardiology (Cardiology) [name], MD as Consulting Physician (Internal Medicine) Ngetich, Nelda Bucks, NP as Nurse Practitioner (Family Medicine)  Extended Emergency Contact Information Primary Emergency Contact: Durrell,Bob Address: [address]           [place], [place] Phone: [phone] Relation: Son Secondary Emergency Contact: Philmore [place] Phone: [phone] Mobile Phone: [phone] Relation: Daughter  Code Status: DNR  Goals of care: Advanced Directive information Advanced Directives 03/08/2019  Does Patient Have a Medical Advance Directive? Yes  Type of Advance Directive Living will;Out of facility DNR (pink MOST or yellow form)  Does patient want to make changes to medical advance directive? No - Patient declined  Copy of [place] in Chart? -  Would patient like information on creating a medical advance directive? No - Patient declined  Pre-existing out of facility DNR order (yellow form or pink MOST form) Yellow form placed in chart (order not valid for inpatient use)     Chief Complaint  Patient presents with   Acute Visit    HPI:  Pt is a 85 y.o. female seen today for an acute visit for Pneumonia  She is SNF for therapy. She has a history of Takotsubo's cardiomyopathy with EF of 55 to 53%, diastolic dysfunction, hypertension, PAF not on any anticoagulation, SIADH, depression  She had mechanical Fall in her apartment. She was Found to have Right Hip Fracture. She underwent InterMedullary Implant on 03/04. Post op course was uncomplicated. She is now in SNF for therapy.  Patient had Chest Xray done which was follow up Xray from last few weeks ago.  It shows Patient has Left Lower Lobe pneumonia She herself is not c/o Fever, Cough Or SOB Her Mental status has improved since she has been on Lower dose of Ultram and Namenda She is working with therapy now also   Past Medical History:  Diagnosis Date   Amputation of finger of right hand    Aortic stenosis, mild 03/03/2016   By echo 08/2015   Arthritis    Back pain    Bradycardia 08/28/2015   CKD (chronic kidney disease) stage 3, GFR 30-59 ml/min (HCC)    Closed right hip fracture (HCC) 12/2018   Depression    GERD (gastroesophageal reflux disease)    GI bleed due to NSAIDs    Hyperlipidemia    Hypertension    Macular degeneration    Maxillary sinusitis    MVP (mitral valve prolapse) 03/03/2016   Bileaflet MVP with mild MR by echo 2016   Old MI (myocardial infarction)    NSTEMI secondary to stress MI/Takotsubo CM, minimal nonobstructive ASCAD at cath   Osteoporosis    Persistent atrial fibrillation 2006   not on anticoagulation due to GI bleed and increased fall risk   PMR (polymyalgia rheumatica) (HCC)    SIADH (syndrome of inappropriate ADH production) ([place])    Takotsubo cardiomyopathy    EF normalized to 70%   Ventricular tachycardia ([place])    on initial presentation of MI   Past Surgical History:  Procedure Laterality Date   CARDIAC CATHETERIZATION     normal coronary arteries   CHOLECYSTECTOMY  11/10/2011   Procedure: LAPAROSCOPIC CHOLECYSTECTOMY  WITH INTRAOPERATIVE CHOLANGIOGRAM;  Surgeon: Pedro Earls, MD;  Location: [facility];  Service: General;  Laterality: N/A;   EYE SURGERY  06/10/11   membrane peel    INTRAMEDULLARY (IM) NAIL INTERTROCHANTERIC Right 12/21/2018   Procedure: INTRAMEDULLARY (IM) NAIL INTERTROCHANTRIC;  Surgeon: Nicholes Stairs, MD;  Location: [facility];  Service: Orthopedics;  Laterality: Right;   NASAL SINUS SURGERY Left    ROTATOR CUFF REPAIR  [id number]   UMBILICAL HERNIA REPAIR  11/10/2011   Procedure: HERNIA REPAIR  UMBILICAL ADULT;  Surgeon: Pedro Earls, MD;  Location: [facility];  Service: General;  Laterality: N/A;   WISDOM TOOTH EXTRACTION      Allergies  Allergen Reactions   Amlodipine Swelling    To feet and ankles    Avelox [Moxifloxacin Hcl In Nacl] Other (See Comments)    Pt does not remember    Biaxin [Clarithromycin]    Ceftin [Cefuroxime Axetil]    Duragesic Disc Transdermal System [Fentanyl] Other (See Comments)    Reaction unknown   Morphine And Related Other (See Comments)    "My Sister and daughter can,t take it. I've had it before without problems."   Parathyroid Hormone (Recomb) Other (See Comments)    Made skin dry   Lidocaine Rash    Outpatient Encounter Medications as of 03/22/2019  Medication Sig   acetaminophen (TYLENOL) 500 MG tablet Take 500 mg by mouth every 6 (six) hours as needed for mild pain, fever or headache.   amLODipine (NORVASC) 5 MG tablet Take 5 mg by mouth daily.   azelastine (ASTELIN) 0.1 % nasal spray Place 2 sprays into both nostrils 2 (two) times daily as needed for allergies.    buPROPion (WELLBUTRIN SR) 150 MG 12 hr tablet Take 1 tablet (150 mg total) by mouth every morning.   carboxymethylcellul-glycerin (LUBRICATING EYE DROPS) 0.5-0.9 % ophthalmic solution Place 1 drop into both eyes 3 (three) times daily as needed for dry eyes. For allergies   carvedilol (COREG) 12.5 MG tablet Take 1 tablet (12.5 mg total) by mouth 2 (two) times daily with a meal.   cetirizine (ZYRTEC) 10 MG tablet Take 10 mg by mouth daily.    demeclocycline (DECLOMYCIN) 150 MG tablet Take 150 mg by mouth 2 (two) times daily.    diclofenac sodium (VOLTAREN) 1 % GEL Apply 1 application topically 4 (four) times daily as needed (pain).    famotidine (PEPCID) 20 MG tablet Take 20 mg by mouth daily.   Ferrous Sulfate (IRON) 325 (65 Fe) MG TABS Take 1 tablet by mouth every other day. Give with meals   fish oil-omega-3 fatty acids 1000 MG capsule Take 1 mg by mouth  daily.    furosemide (LASIX) 20 MG tablet Take 20 mg by mouth every other day.   lactose free nutrition (BOOST) LIQD Take 237 mLs by mouth 2 (two) times daily between meals.   Melatonin 5 MG TABS Take 5 mg by mouth at bedtime as needed.   memantine (NAMENDA) 5 MG tablet Take 5 mg by mouth 2 (two) times daily.   Multiple Vitamins-Minerals (CENTRUM CARDIO) TABS Take by mouth 2 (two) times daily.    ondansetron (ZOFRAN) 4 MG tablet Take 1 tablet (4 mg total) by mouth every 6 (six) hours as needed for nausea.   OXYGEN Inhale 2 L into the lungs 3 (three) times daily as needed.   polyethylene glycol (MIRALAX / GLYCOLAX) packet Take 17 g by mouth daily as needed for mild constipation.  traMADol (ULTRAM-ER) 100 MG 24 hr tablet Take 100 mg by mouth daily.   zinc oxide 20 % ointment Apply 1 application topically as needed for irritation. Apply to peri area as needed   [DISCONTINUED] furosemide (LASIX) 20 MG tablet Take 20 mg by mouth daily. Every other day   [DISCONTINUED] traMADol (ULTRAM-ER) 300 MG 24 hr tablet Take 1 tablet (300 mg total) by mouth daily. (Patient taking differently: Take 300 mg by mouth daily. Take 200 once a morning and 100mg  once a morning)   No facility-administered encounter medications on file as of 03/22/2019.    ROS Review of Systems  Constitutional: Negative for activity change, appetite change, chills, diaphoresis, fatigue and fever.  HENT: Negative for mouth sores, postnasal drip, rhinorrhea, sinus pain and sore throat.   Respiratory: Negative for apnea, cough, chest tightness, shortness of breath and wheezing.   Cardiovascular: Negative for chest pain, palpitations and leg swelling.  Gastrointestinal: Negative for abdominal distention, abdominal pain, constipation, diarrhea, nausea and vomiting.  Genitourinary: Negative for dysuria and frequency.  Musculoskeletal: Negative for arthralgias, joint swelling and myalgias.  Skin: Negative for rash.  Neurological:  Negative for dizziness, syncope, weakness, light-headedness and numbness.  Psychiatric/Behavioral: Negative for behavioral problems, confusion and sleep disturbance.      Immunization History  Administered Date(s) Administered   Influenza, High Dose Seasonal PF 07/12/2017   Pertinent  Health Maintenance Due  Topic Date Due   DEXA SCAN  02/20/1990   PNA vac Low Risk Adult (1 of 2 - PCV13) 02/20/1990   INFLUENZA VACCINE  05/20/2019   No flowsheet data found. Functional Status Survey:    Vitals:   03/23/19 2104  BP: 132/62  Pulse: 74  Resp: 18  Temp: (!) 97.2 F (36.2 C)  SpO2: 92%   There is no height or weight on file to calculate BMI. Constitutional: Oriented to person, place, and time. Well-developed and well-nourished.  HENT:  Head: Normocephalic.  Mouth/Throat: Oropharynx is clear and moist.  Eyes: Pupils are equal, round, and reactive to light.  Neck: Neck supple.  Cardiovascular: Normal rate and normal heart sounds.  No murmur heard. Pulmonary/Chest: Effort normal and breath sounds normal. No respiratory distress. Has Rales Bilateral  Abdominal: Soft. Bowel sounds are normal. No distension. There is no tenderness. There is no rebound.  Musculoskeletal: No edema.  Lymphadenopathy: none Neurological: Alert and oriented to person, place, and time.  Skin: Skin is warm and dry.  Psychiatric: Normal mood and affect. Behavior is normal. Thought content normal.    Labs reviewed: Recent Labs    12/21/18 0019 12/22/18 0304 12/23/18 0207 12/29/18 01/30/19  NA 136 136 132* 136* 137  K 5.0 4.1 4.2 4.0 4.7  CL 106 103 102  --   --   CO2 23 25 25   --   --   GLUCOSE 140* 137* 110*  --   --   BUN 35* 14 20 24* 25*  CREATININE 1.21* 0.91 1.37* 0.9 1.2*  CALCIUM 8.9 8.3* 8.4*  --   --   MG  --  1.8  --   --   --    Recent Labs    12/22/18 0304 12/23/18 0207  AST 86* 53*  ALT 64* 37  ALKPHOS 82 69  BILITOT 0.6 0.7  PROT 5.6* 5.4*  ALBUMIN 2.6* 2.3*    Recent Labs    12/21/18 0019 12/22/18 0304 12/23/18 0207 01/30/19 02/16/19 02/20/19  WBC 10.4 10.4 10.9* 7.6 10.8  --   NEUTROABS  7.5 9.4* 7.3  --   --   --   HGB 11.3* 9.3* 8.0* 9.5* 10.2*   10.2* 9.8*  HCT 35.8* 28.7* 25.4* 28* 31*   31* 29*  MCV 93.2 93.8 93.7  --   --   --   PLT 319 234 189 431* 387   387 419*   Lab Results  Component Value Date   TSH 2.600 09/19/2018   No results found for: HGBA1C No results found for: CHOL, HDL, LDLCALC, LDLDIRECT, TRIG, CHOLHDL  Significant Diagnostic Results in last 30 days:  No results found.  Assessment/Plan Left Lower Lobe Pneumonia on repeat Chest XRay Clinically patient is better Will treat her with Doxycyline 100 mg BID for 7 days Repeat Xray in 4 weeks If still has infilterate consider CT scan of Chest  Cognitive Impairnamnet'  She does have Vascualr dementia Her CT scan in hospital had not shown anything acute but she had Chronic Vascualr disese She is doing well on Namenda and Reduced dose of Ultram  Other issues are stable Closed fracture of rightHip S/P ORIF Patient is WBAT Per therapy she was doing well before but is not making much progress now Aspirin for DVT Follow up with Ortho  Takotsubo cardiomyopathy On Lasix QOD She has gained some weight but looks Euvolemic   Atrial fibrillation (HCC) Not on any Anticoagulation due to h/o GI bleed On Coreg for Rate Control  Essential hypertension, BP stable on Amlodipine  SIADH (syndrome of inappropriate ADH production) Sodium Stable on Demclocycline Depression On Wellbutrin Anemia Low but stable on Iron Insomnia On Melatonin 5 mg QD    Family/ staff Communication:  Labs/tests ordered:   Total time spent in this patient care encounter was  _25  minutes; greater than 50% of the visit spent counseling patient and staff, reviewing records , Labs and coordinating care for problems addressed at this encounter.

## 2019-03-23 ENCOUNTER — Encounter: Payer: Self-pay | Admitting: Internal Medicine

## 2019-03-28 ENCOUNTER — Encounter: Payer: Self-pay | Admitting: Nurse Practitioner

## 2019-03-28 ENCOUNTER — Non-Acute Institutional Stay (SKILLED_NURSING_FACILITY): Payer: Medicare Other | Admitting: Nurse Practitioner

## 2019-03-28 DIAGNOSIS — R634 Abnormal weight loss: Secondary | ICD-10-CM | POA: Diagnosis not present

## 2019-03-28 DIAGNOSIS — R441 Visual hallucinations: Secondary | ICD-10-CM | POA: Insufficient documentation

## 2019-03-28 DIAGNOSIS — R4189 Other symptoms and signs involving cognitive functions and awareness: Secondary | ICD-10-CM | POA: Diagnosis not present

## 2019-03-28 DIAGNOSIS — R635 Abnormal weight gain: Secondary | ICD-10-CM | POA: Insufficient documentation

## 2019-03-28 DIAGNOSIS — K219 Gastro-esophageal reflux disease without esophagitis: Secondary | ICD-10-CM

## 2019-03-28 NOTE — Assessment & Plan Note (Signed)
Continue SNF [facility] for safety and care assistance, continue Memantine.

## 2019-03-28 NOTE — Assessment & Plan Note (Signed)
Seeing people in her room, will update CBC/diff, CMP/eGFR, will try Quetiapine 12.67m qd, observe

## 2019-03-28 NOTE — Assessment & Plan Note (Signed)
Stable, continue Famotidine 20mg qd.  

## 2019-03-28 NOTE — Progress Notes (Signed)
Location:   SNF [place] Room Number: 20/A Place of Service:  SNF (31) Provider: [facility] Allysen Lazo NP  [name], MD  Patient Care Team: [name], MD as PCP - General (Internal Medicine) [name], MD as PCP - Cardiology (Cardiology) [name], MD as Consulting Physician (Internal Medicine) Ngetich, Nelda Bucks, NP as Nurse Practitioner (Family Medicine)  Extended Emergency Contact Information Primary Emergency Contact: Minion,Bob Address: [address]           [place], [place] Phone: [phone] Relation: Son Secondary Emergency Contact: Philmore Pal[place] Phone: [phone] Mobile Phone: [phone] Relation: Daughter  Code Status:  DNR Goals of care: Advanced Directive information Advanced Directives 03/28/2019  Does Patient Have a Medical Advance Directive? Yes  Type of Advance Directive Living will;Out of facility DNR (pink MOST or yellow form)  Does patient want to make changes to medical advance directive? No - Patient declined  Copy of [place] in Chart? -  Would patient like information on creating a medical advance directive? No - Patient declined  Pre-existing out of facility DNR order (yellow form or pink MOST form) Yellow form placed in chart (order not valid for inpatient use)     Chief Complaint  Patient presents with   Acute Visit    Weight loss,hallucinations    HPI:  Pt is a 85 y.o. female seen today for an acute visit for reported weight loss, #108Ibs 03/28/19, #113 Ibs 03/03/19, # 108Ibs 02/06/19. The patient stated her stomach feels better, she was noted eating desert upon my visit today. She denied indigestion, nausea, vomiting, or abd pain. Reported hallucination, not new, but more frequently,03/24/19 reported the patient stated the boy in the room not leaving. 03/21/19 reported the patient stated a good looking woman helped me in here, staff stated  no one had helped her to bathroom. Hx of dementia, recently started on Memantine to preserve memory. She resides in SNF [facility], non ambulatory, right leg contractures. Hx of depression, on Bupropion 181m qd. GERD, on Famotidine 278mqd.   Past Medical History:  Diagnosis Date   Amputation of finger of right hand    Aortic stenosis, mild 03/03/2016   By echo 08/2015   Arthritis    Back pain    Bradycardia 08/28/2015   CKD (chronic kidney disease) stage 3, GFR 30-59 ml/min (HCC)    Closed right hip fracture (HCC) 12/2018   Depression    GERD (gastroesophageal reflux disease)    GI bleed due to NSAIDs    Hyperlipidemia    Hypertension    Macular degeneration    Maxillary sinusitis    MVP (mitral valve prolapse) 03/03/2016   Bileaflet MVP with mild MR by echo 2016   Old MI (myocardial infarction)    NSTEMI secondary to stress MI/Takotsubo CM, minimal nonobstructive ASCAD at cath   Osteoporosis    Persistent atrial fibrillation 2006   not on anticoagulation due to GI bleed and increased fall risk   PMR (polymyalgia rheumatica) (HCC)    SIADH (syndrome of inappropriate ADH production) ([facility]   Takotsubo cardiomyopathy    EF normalized to 70%   Ventricular tachycardia ([facility]   on initial presentation of MI   Past Surgical History:  Procedure Laterality Date   CARDIAC CATHETERIZATION     normal coronary arteries   CHOLECYSTECTOMY  11/10/2011   Procedure: LAPAROSCOPIC CHOLECYSTECTOMY WITH INTRAOPERATIVE CHOLANGIOGRAM;  Surgeon: MaRodman Key  Verdie Drown, MD;  Location: [facility];  Service: General;  Laterality: N/A;   EYE SURGERY  06/10/11   membrane peel    INTRAMEDULLARY (IM) NAIL INTERTROCHANTERIC Right 12/21/2018   Procedure: INTRAMEDULLARY (IM) NAIL INTERTROCHANTRIC;  Surgeon: Nicholes Stairs, MD;  Location: [place];  Service: Orthopedics;  Laterality: Right;   NASAL SINUS SURGERY Left    ROTATOR CUFF REPAIR  [id number]   UMBILICAL HERNIA REPAIR  11/10/2011    Procedure: HERNIA REPAIR UMBILICAL ADULT;  Surgeon: Pedro Earls, MD;  Location: [facility];  Service: General;  Laterality: N/A;   WISDOM TOOTH EXTRACTION      Allergies  Allergen Reactions   Amlodipine Swelling    To feet and ankles    Avelox [Moxifloxacin Hcl In Nacl] Other (See Comments)    Pt does not remember    Biaxin [Clarithromycin]    Ceftin [Cefuroxime Axetil]    Duragesic Disc Transdermal System [Fentanyl] Other (See Comments)    Reaction unknown   Morphine And Related Other (See Comments)    "My Sister and daughter can,t take it. I've had it before without problems."   Parathyroid Hormone (Recomb) Other (See Comments)    Made skin dry   Lidocaine Rash    Allergies as of 03/28/2019      Reactions   Amlodipine Swelling   To feet and ankles    Avelox [moxifloxacin Hcl In Nacl] Other (See Comments)   Pt does not remember   Biaxin [clarithromycin]    Ceftin [cefuroxime Axetil]    Duragesic Disc Transdermal System [fentanyl] Other (See Comments)   Reaction unknown   Morphine And Related Other (See Comments)   "My Sister and daughter can,t take it. I've had it before without problems."   Parathyroid Hormone (recomb) Other (See Comments)   Made skin dry   Lidocaine Rash      Medication List       Accurate as of March 28, 2019  3:42 PM. If you have any questions, ask your nurse or doctor.        acetaminophen 500 MG tablet Commonly known as:  TYLENOL Take 500 mg by mouth every 6 (six) hours as needed for mild pain, fever or headache.   amLODipine 5 MG tablet Commonly known as:  NORVASC Take 5 mg by mouth daily.   azelastine 0.1 % nasal spray Commonly known as:  ASTELIN Place 2 sprays into both nostrils 2 (two) times daily as needed for allergies.   buPROPion 150 MG 12 hr tablet Commonly known as:  WELLBUTRIN SR Take 1 tablet (150 mg total) by mouth every morning.   carvedilol 12.5 MG tablet Commonly known as:  COREG Take 1 tablet (12.5 mg  total) by mouth 2 (two) times daily with a meal.   Centrum Cardio Tabs Take by mouth 2 (two) times daily.   cetirizine 10 MG tablet Commonly known as:  ZYRTEC Take 10 mg by mouth daily.   demeclocycline 150 MG tablet Commonly known as:  DECLOMYCIN Take 150 mg by mouth 2 (two) times daily.   diclofenac sodium 1 % Gel Commonly known as:  VOLTAREN Apply 1 application topically 4 (four) times daily as needed (pain).   doxycycline 100 MG capsule Commonly known as:  MONODOX Take 100 mg by mouth 2 (two) times daily.   famotidine 20 MG tablet Commonly known as:  PEPCID Take 20 mg by mouth daily.   feeding supplement (PRO-STAT SUGAR FREE 64) Liqd Take 30 mLs by mouth daily.  fish oil-omega-3 fatty acids 1000 MG capsule Take 1 mg by mouth daily.   furosemide 20 MG tablet Commonly known as:  LASIX Take 20 mg by mouth every other day.   Iron 325 (65 Fe) MG Tabs Take 1 tablet by mouth every other day. Give with meals   lactose free nutrition Liqd Take 237 mLs by mouth 2 (two) times daily between meals.   Lubricating Eye Drops 0.5-0.9 % ophthalmic solution Generic drug:  carboxymethylcellul-glycerin Place 1 drop into both eyes 3 (three) times daily as needed for dry eyes. For allergies   Melatonin 5 MG Tabs Take 5 mg by mouth at bedtime as needed.   memantine 5 MG tablet Commonly known as:  NAMENDA Take 5 mg by mouth 2 (two) times daily.   ondansetron 4 MG tablet Commonly known as:  ZOFRAN Take 1 tablet (4 mg total) by mouth every 6 (six) hours as needed for nausea.   OXYGEN Inhale 2 L into the lungs 3 (three) times daily as needed.   polyethylene glycol 17 g packet Commonly known as:  MIRALAX / GLYCOLAX Take 17 g by mouth daily as needed for mild constipation.   Probiotic Caps Take 1 capsule by mouth daily.   traMADol 100 MG 24 hr tablet Commonly known as:  ULTRAM-ER Take 100 mg by mouth daily.   zinc oxide 20 % ointment Apply 1 application topically as  needed for irritation. Apply to peri area as needed      ROS was provided with assistance of staff Review of Systems  Constitutional: Positive for unexpected weight change. Negative for activity change, appetite change, chills, diaphoresis, fatigue and fever.  HENT: Positive for hearing loss. Negative for congestion and voice change.   Eyes:       Macular degeneration, able to see my fingers holding up 2 feet  in front of her.   Respiratory: Negative for cough, shortness of breath and wheezing.   Cardiovascular: Negative for chest pain, palpitations and leg swelling.  Gastrointestinal: Negative for abdominal distention, abdominal pain, constipation, diarrhea, nausea and vomiting.  Genitourinary: Negative for difficulty urinating, dysuria and urgency.  Musculoskeletal: Positive for arthralgias and gait problem.  Skin: Negative for color change and pallor.  Neurological: Negative for dizziness, speech difficulty, weakness and headaches.       Dementia  Psychiatric/Behavioral: Positive for dysphoric mood and hallucinations. Negative for agitation, behavioral problems and sleep disturbance. The patient is not nervous/anxious.     Immunization History  Administered Date(s) Administered   Influenza, High Dose Seasonal PF 07/12/2017   Pertinent  Health Maintenance Due  Topic Date Due   DEXA SCAN  02/20/1990   PNA vac Low Risk Adult (1 of 2 - PCV13) 02/20/1990   INFLUENZA VACCINE  05/20/2019   No flowsheet data found. Functional Status Survey:    Vitals:   03/28/19 1218  BP: (!) 142/73  Pulse: 68  Resp: 18  Temp: (!) 97.3 F (36.3 C)  SpO2: 94%  Weight: 108 lb 14.4 oz (49.4 kg)  Height: 5' (1.524 m)   Body mass index is 21.27 kg/m. Physical Exam Vitals signs and nursing note reviewed.  Constitutional:      General: She is not in acute distress.    Appearance: Normal appearance. She is not ill-appearing, toxic-appearing or diaphoretic.  HENT:     Head: Normocephalic  and atraumatic.     Nose: Nose normal.     Mouth/Throat:     Mouth: Mucous membranes are moist.  Eyes:  Extraocular Movements: Extraocular movements intact.     Conjunctiva/sclera: Conjunctivae normal.     Pupils: Pupils are equal, round, and reactive to light.  Neck:     Musculoskeletal: Normal range of motion and neck supple.  Cardiovascular:     Rate and Rhythm: Normal rate and regular rhythm.     Heart sounds: No murmur.  Pulmonary:     Effort: Pulmonary effort is normal.     Breath sounds: No wheezing, rhonchi or rales.  Chest:     Chest wall: No tenderness.  Abdominal:     General: Bowel sounds are normal. There is no distension.     Palpations: Abdomen is soft.     Tenderness: There is no abdominal tenderness. There is no right CVA tenderness, left CVA tenderness, guarding or rebound.  Musculoskeletal:     Right lower leg: No edema.     Left lower leg: No edema.     Comments: Stands, but not ambulating, right leg contracture, w/c for mobility.   Skin:    General: Skin is warm and dry.  Neurological:     General: No focal deficit present.     Mental Status: She is alert. Mental status is at baseline.     Cranial Nerves: No cranial nerve deficit.     Motor: No weakness.     Coordination: Coordination normal.     Deep Tendon Reflexes: Reflexes normal.     Comments: Oriented to person and place.   Psychiatric:        Attention and Perception: She perceives visual hallucinations.        Mood and Affect: Affect is flat. Affect is not tearful.        Speech: Speech is not rapid and pressured or delayed.        Behavior: Behavior is slowed. Behavior is not agitated, aggressive, withdrawn, hyperactive or combative.        Cognition and Memory: Cognition is impaired. Memory is impaired.        Judgment: Judgment is not inappropriate.     Labs reviewed: Recent Labs    12/21/18 0019 12/22/18 0304 12/23/18 0207 12/29/18 01/30/19  NA 136 136 132* 136* 137  K 5.0 4.1  4.2 4.0 4.7  CL 106 103 102  --   --   CO2 '23 25 25  ' --   --   GLUCOSE 140* 137* 110*  --   --   BUN 35* 14 20 24* 25*  CREATININE 1.21* 0.91 1.37* 0.9 1.2*  CALCIUM 8.9 8.3* 8.4*  --   --   MG  --  1.8  --   --   --    Recent Labs    12/22/18 0304 12/23/18 0207  AST 86* 53*  ALT 64* 37  ALKPHOS 82 69  BILITOT 0.6 0.7  PROT 5.6* 5.4*  ALBUMIN 2.6* 2.3*   Recent Labs    12/21/18 0019 12/22/18 0304 12/23/18 0207 01/30/19 02/16/19 02/20/19  WBC 10.4 10.4 10.9* 7.6 10.8  --   NEUTROABS 7.5 9.4* 7.3  --   --   --   HGB 11.3* 9.3* 8.0* 9.5* 10.2*   10.2* 9.8*  HCT 35.8* 28.7* 25.4* 28* 31*   31* 29*  MCV 93.2 93.8 93.7  --   --   --   PLT 319 234 189 431* 387   387 419*   Lab Results  Component Value Date   TSH 2.600 09/19/2018   No results found for: HGBA1C  No results found for: CHOL, HDL, LDLCALC, LDLDIRECT, TRIG, CHOLHDL  Significant Diagnostic Results in last 30 days:  No results found.  Assessment/Plan Hallucination, visual Seeing people in her room, will update CBC/diff, CMP/eGFR, will try Quetiapine 12.95m qd, observe  Cognitive impairment Continue SNF [facility] for safety and care assistance, continue Memantine.   Weight loss #108Ibs 03/28/19, #113 Ibs 03/03/19, # 108Ibs 02/06/19. Continue f/u dietary.   Esophageal reflux Stable, continue Famotidine 224mqd.    Family/ staff Communication: plan of care reviewed with the patient and charge nurse.   Labs/tests ordered: CBC/diff, CMP/eGFR  Time spend 25 minutes.

## 2019-03-28 NOTE — Assessment & Plan Note (Signed)
#  108Ibs 03/28/19, #113 Ibs 03/03/19, # 108Ibs 02/06/19. Continue f/u dietary.

## 2019-03-30 LAB — HEPATIC FUNCTION PANEL
ALT: 11 (ref 7–35)
AST: 20 (ref 13–35)
Alkaline Phosphatase: 92 (ref 25–125)
Bilirubin, Total: 0.3

## 2019-03-30 LAB — CBC AND DIFFERENTIAL
HCT: 30 — AB (ref 36–46)
Hemoglobin: 9.8 — AB (ref 12.0–16.0)
Platelets: 532 — AB (ref 150–399)
WBC: 7.9

## 2019-03-30 LAB — BASIC METABOLIC PANEL
BUN: 25 — AB (ref 4–21)
Creatinine: 1.1 (ref 0.5–1.1)
Glucose: 74
Potassium: 4.4 (ref 3.4–5.3)
Sodium: 136 — AB (ref 137–147)

## 2019-04-05 LAB — NOVEL CORONAVIRUS, NAA: SARS-CoV-2, NAA: NOT DETECTED

## 2019-04-09 ENCOUNTER — Other Ambulatory Visit: Payer: Self-pay | Admitting: Cardiology

## 2019-04-10 ENCOUNTER — Encounter: Payer: Self-pay | Admitting: Internal Medicine

## 2019-04-10 ENCOUNTER — Non-Acute Institutional Stay (SKILLED_NURSING_FACILITY): Payer: Medicare Other | Admitting: Internal Medicine

## 2019-04-10 DIAGNOSIS — S72001S Fracture of unspecified part of neck of right femur, sequela: Secondary | ICD-10-CM

## 2019-04-10 DIAGNOSIS — E7800 Pure hypercholesterolemia, unspecified: Secondary | ICD-10-CM

## 2019-04-10 DIAGNOSIS — R410 Disorientation, unspecified: Secondary | ICD-10-CM

## 2019-04-10 DIAGNOSIS — R634 Abnormal weight loss: Secondary | ICD-10-CM

## 2019-04-10 DIAGNOSIS — I10 Essential (primary) hypertension: Secondary | ICD-10-CM | POA: Diagnosis not present

## 2019-04-10 DIAGNOSIS — I4819 Other persistent atrial fibrillation: Secondary | ICD-10-CM

## 2019-04-10 DIAGNOSIS — E222 Syndrome of inappropriate secretion of antidiuretic hormone: Secondary | ICD-10-CM | POA: Diagnosis not present

## 2019-04-10 DIAGNOSIS — I5181 Takotsubo syndrome: Secondary | ICD-10-CM

## 2019-04-10 NOTE — Progress Notes (Signed)
Location:  [facility] Room Number: 20/A Place of Service:  SNF (31) Provider: Meredith Staggers L,MD   [name], MD  Patient Care Team: [name], MD as PCP - General (Internal Medicine) [name], MD as PCP - Cardiology (Cardiology) [name], MD as Consulting Physician (Internal Medicine) Ngetich, Nelda Bucks, NP as Nurse Practitioner (Family Medicine)  Extended Emergency Contact Information Primary Emergency Contact: Gaber,Bob Address: [address]           [place], [place] Phone: [phone] Relation: Son Secondary Emergency Contact: Philmore Pali [place] of [place] Phone: [phone] Mobile Phone: [phone] Relation: Daughter  Code Status: DNR  Goals of care: Advanced Directive information Advanced Directives 04/10/2019  Does Patient Have a Medical Advance Directive? Yes  Type of Advance Directive Out of facility DNR (pink MOST or yellow form);Living will  Does patient want to make changes to medical advance directive? No - Patient declined  Copy of [place] in Chart? -  Would patient like information on creating a medical advance directive? No - Patient declined  Pre-existing out of facility DNR order (yellow form or pink MOST form) Yellow form placed in chart (order not valid for inpatient use)     Chief Complaint  Patient presents with  . Medical Management of Chronic Issues    Routine visit   . Health Maintenance    tetanus vaccine,PCV13, Dexa Scan     HPI:  Pt is a 85 y.o. female seen today for medical management of chronic diseases.   She has a history of Takotsubo's cardiomyopathy with EF of 55 to 29%, diastolic dysfunction, hypertension, PAF not on any anticoagulation, SIADH, depression She has been in SNF since 3/04 after her Right hip fracture. She underwent InterMedullary Implant on 03/04.  Patient has been stable. Her main problem was  Hallucinations Chest Xray showed Pneumonia. She finished her Antibiotics but continues to have Visual hallucinations She was started on Seroquel And per nurses she is doing well. But she continue to loose weight and has lost almost 15 lbs in Past 3 months. Per nurses her Appetite is poor  Patient did not have any new complains She did c/o Pain in her Right leg. Usually relieved with Tramadol and Tylenol Walking with Gilford Rile but with Mod assist with therapy  Past Medical History:  Diagnosis Date  . Amputation of finger of right hand   . Aortic stenosis, mild 03/03/2016   By echo 08/2015  . Arthritis   . Back pain   . Bradycardia 08/28/2015  . CKD (chronic kidney disease) stage 3, GFR 30-59 ml/min (HCC)   . Closed right hip fracture ([facility]) 12/2018  . Depression   . GERD (gastroesophageal reflux disease)   . GI bleed due to NSAIDs   . Hyperlipidemia   . Hypertension   . Macular degeneration   . Maxillary sinusitis   . MVP (mitral valve prolapse) 03/03/2016   Bileaflet MVP with mild MR by echo 2016  . Old MI (myocardial infarction)    NSTEMI secondary to stress MI/Takotsubo CM, minimal nonobstructive ASCAD at cath  . Osteoporosis   . Persistent atrial fibrillation 2006   not on anticoagulation due to GI bleed and increased fall risk  . PMR (polymyalgia rheumatica) (HCC)   . SIADH (syndrome of inappropriate ADH production) ([place])   . Takotsubo cardiomyopathy    EF normalized to 70%  . Ventricular tachycardia ([facility])  on initial presentation of MI   Past Surgical History:  Procedure Laterality Date  . CARDIAC CATHETERIZATION     normal coronary arteries  . CHOLECYSTECTOMY  11/10/2011   Procedure: LAPAROSCOPIC CHOLECYSTECTOMY WITH INTRAOPERATIVE CHOLANGIOGRAM;  Surgeon: Pedro Earls, MD;  [facility]: [facility];  Service: General;  Laterality: N/A;  . EYE SURGERY  06/10/11   membrane peel   . INTRAMEDULLARY (IM) NAIL INTERTROCHANTERIC Right 12/21/2018   Procedure: INTRAMEDULLARY (IM)  NAIL INTERTROCHANTRIC;  Surgeon: Nicholes Stairs, MD;  Location: [facility];  Service: Orthopedics;  Laterality: Right;  . NASAL SINUS SURGERY Left   . ROTATOR CUFF REPAIR  1998  . UMBILICAL HERNIA REPAIR  11/10/2011   Procedure: HERNIA REPAIR UMBILICAL ADULT;  Surgeon: Pedro Earls, MD;  [facility];  Service: General;  Laterality: N/A;  . WISDOM TOOTH EXTRACTION      Allergies  Allergen Reactions  . Amlodipine Swelling    To feet and ankles   . Avelox [Moxifloxacin Hcl In Nacl] Other (See Comments)    Pt does not remember   . Biaxin [Clarithromycin]   . Ceftin [Cefuroxime Axetil]   . Duragesic Disc Transdermal System [Fentanyl] Other (See Comments)    Reaction unknown  . Morphine And Related Other (See Comments)    "My Sister and daughter can,t take it. I've had it before without problems."  . Parathyroid Hormone (Recomb) Other (See Comments)    Made skin dry  . Teriparatide   . Lidocaine Rash    Outpatient Encounter Medications as of 04/10/2019  Medication Sig  . acetaminophen (TYLENOL) 500 MG tablet Take 500 mg by mouth every 6 (six) hours as needed for mild pain, fever or headache.  . Amino Acids-Protein Hydrolys (FEEDING SUPPLEMENT, PRO-STAT SUGAR FREE 64,) LIQD Take 30 mLs by mouth daily.  Marland Kitchen amLODipine (NORVASC) 5 MG tablet Take 5 mg by mouth daily.  Marland Kitchen azelastine (ASTELIN) 0.1 % nasal spray Place 2 sprays into both nostrils 2 (two) times daily as needed for allergies.   Marland Kitchen buPROPion (WELLBUTRIN SR) 150 MG 12 hr tablet Take 1 tablet (150 mg total) by mouth every morning.  . carboxymethylcellul-glycerin (LUBRICATING EYE DROPS) 0.5-0.9 % ophthalmic solution Place 1 drop into both eyes 3 (three) times daily as needed for dry eyes. For allergies  . carvedilol (COREG) 12.5 MG tablet Take 1 tablet (12.5 mg total) by mouth 2 (two) times daily with a meal.  . cetirizine (ZYRTEC) 10 MG tablet Take 10 mg by mouth daily.   Marland Kitchen demeclocycline (DECLOMYCIN) 150 MG tablet Take 150 mg  by mouth 2 (two) times daily.   . diclofenac sodium (VOLTAREN) 1 % GEL Apply 1 application topically 4 (four) times daily as needed (pain).   . famotidine (PEPCID) 20 MG tablet Take 20 mg by mouth daily.  . Ferrous Sulfate (IRON) 325 (65 Fe) MG TABS Take 1 tablet by mouth every other day. Give with meals  . furosemide (LASIX) 20 MG tablet Take 20 mg by mouth every other day.  . lactose free nutrition (BOOST) LIQD Take 237 mLs by mouth 2 (two) times daily between meals.  . memantine (NAMENDA) 5 MG tablet Take 5 mg by mouth 2 (two) times daily.  . Multiple Vitamins-Minerals (CENTRUM CARDIO) TABS Take by mouth 2 (two) times daily.   . ondansetron (ZOFRAN) 4 MG tablet Take 1 tablet (4 mg total) by mouth every 6 (six) hours as needed for nausea.  . OXYGEN Inhale 2 L into the lungs 3 (three)  times daily as needed.  . polyethylene glycol (MIRALAX / GLYCOLAX) packet Take 17 g by mouth daily as needed for mild constipation.   . QUEtiapine (SEROQUEL) 25 MG tablet Take 12.5 mg by mouth daily.  . traMADol (ULTRAM-ER) 100 MG 24 hr tablet Take 100 mg by mouth daily.  Marland Kitchen zinc oxide 20 % ointment Apply 1 application topically as needed for irritation. Apply to peri area as needed  . [DISCONTINUED] doxycycline (MONODOX) 100 MG capsule Take 100 mg by mouth 2 (two) times daily.  . [DISCONTINUED] fish oil-omega-3 fatty acids 1000 MG capsule Take 1 mg by mouth daily.   . [DISCONTINUED] Melatonin 5 MG TABS Take 5 mg by mouth at bedtime as needed.  . [DISCONTINUED] Probiotic CAPS Take 1 capsule by mouth daily.   No facility-administered encounter medications on file as of 04/10/2019.     Review of Systems  Constitutional: Positive for activity change, appetite change and unexpected weight change.  HENT: Negative.   Respiratory: Negative.   Cardiovascular: Negative.   Gastrointestinal: Negative.   Genitourinary: Negative.   Musculoskeletal: Positive for myalgias.  Skin: Negative.   Neurological: Positive for  weakness.  Psychiatric/Behavioral: Positive for confusion.    Immunization History  Administered Date(s) Administered  . Influenza, High Dose Seasonal PF 07/12/2017   Pertinent  Health Maintenance Due  Topic Date Due  . DEXA SCAN  02/20/1990  . PNA vac Low Risk Adult (1 of 2 - PCV13) 02/20/1990  . INFLUENZA VACCINE  05/20/2019   No flowsheet data found. Functional Status Survey:    Vitals:   04/10/19 0919  BP: (!) 126/58  Pulse: 70  Resp: 20  Temp: 97.8 F (36.6 C)  SpO2: 97%  Weight: 106 lb 4.8 oz (48.2 kg)   Body mass index is 20.76 kg/m. Physical Exam  Constitutional: Oriented to person, place, and time. Well-developed and well-nourished.  HENT:  Head: Normocephalic.  Mouth/Throat: Oropharynx is clear and moist.  Eyes: Pupils are equal, round, and reactive to light.  Neck: Neck supple.  Cardiovascular: Normal rate and normal heart sounds.  No murmur heard. Pulmonary/Chest: Effort normal and breath sounds normal. No respiratory distress. No wheezes. She has no rales.  Abdominal: Soft. Bowel sounds are normal. No distension. There is no tenderness. There is no rebound.  Musculoskeletal:Mild swelling Bilateral Lymphadenopathy: none Neurological: . Patient was alert and Oriented. Knew the president Does not remember having halluciantions   Skin: Skin is warm and dry.  Psychiatric: Normal mood and affect. Behavior is normal. Thought content normal.    Labs reviewed: Recent Labs    12/21/18 0019 12/22/18 0304 12/23/18 0207 12/29/18 01/30/19 03/30/19  NA 136 136 132* 136* 137 136*  K 5.0 4.1 4.2 4.0 4.7 4.4  CL 106 103 102  --   --   --   CO2 23 25 25   --   --   --   GLUCOSE 140* 137* 110*  --   --   --   BUN 35* 14 20 24* 25* 25*  CREATININE 1.21* 0.91 1.37* 0.9 1.2* 1.1  CALCIUM 8.9 8.3* 8.4*  --   --   --   MG  --  1.8  --   --   --   --    Recent Labs    12/22/18 0304 12/23/18 0207 03/30/19  AST 86* 53* 20  ALT 64* 37 11  ALKPHOS 82 69  --    BILITOT 0.6 0.7  --   PROT 5.6* 5.4*  --  ALBUMIN 2.6* 2.3*  --    Recent Labs    12/21/18 0019 12/22/18 0304 12/23/18 0207 01/30/19 02/16/19 02/20/19 03/30/19  WBC 10.4 10.4 10.9* 7.6 10.8  --   --   NEUTROABS 7.5 9.4* 7.3  --   --   --   --   HGB 11.3* 9.3* 8.0* 9.5* 10.2*  10.2* 9.8* 9.8*  HCT 35.8* 28.7* 25.4* 28* 31*  31* 29* 30*  MCV 93.2 93.8 93.7  --   --   --   --   PLT 319 234 189 431* 387  387 419* 532*   Lab Results  Component Value Date   TSH 2.600 09/19/2018   No results found for: HGBA1C No results found for: CHOL, HDL, LDLCALC, LDLDIRECT, TRIG, CHOLHDL  Significant Diagnostic Results in last 30 days:  No results found.  Assessment/Plan Takotsubo cardiomyopathy - Plan:  Continue on Lasix  Persistent atrial fibrillation ([place]) - Plan:  Not on any Anticoagulation due to GI bleed On Coreg for rate Control  Essential hypertension, benign - Plan:  BP stable  SIADH (syndrome of inappropriate ADH production) ([facility]) - Plan:  Sodium is stable on Demeclocycline  Delirium - Plan:  Labs were Negative  Has been doing well in Namenda and Seroquel Low dose  Weight loss - Plan:  Will start on Remeron 7.5 mg QHS  Right Hip Arthroplasty Pain seem to be controlled on Ultram and Tylenol Cognitive Impairment Speech to Eval as her Son wants to eventually take care of her Trust and wants to make sure she continues to stay in SNF for higher Level of Care    Family/ staff Communication:   Labs/tests ordered:    Total time spent in this patient care encounter was  25_  minutes; greater than 50% of the visit spent counseling patient and staff, reviewing records , Labs and coordinating care for problems addressed at this encounter.

## 2019-04-20 ENCOUNTER — Non-Acute Institutional Stay (SKILLED_NURSING_FACILITY): Payer: Medicare Other | Admitting: Internal Medicine

## 2019-04-20 ENCOUNTER — Encounter: Payer: Self-pay | Admitting: Internal Medicine

## 2019-04-20 DIAGNOSIS — I5181 Takotsubo syndrome: Secondary | ICD-10-CM | POA: Diagnosis not present

## 2019-04-20 DIAGNOSIS — I4819 Other persistent atrial fibrillation: Secondary | ICD-10-CM

## 2019-04-20 DIAGNOSIS — E222 Syndrome of inappropriate secretion of antidiuretic hormone: Secondary | ICD-10-CM

## 2019-04-20 NOTE — Progress Notes (Signed)
Location:  [facility] Room Number: 20A Place of Service:  SNF [phone]) Provider:  Dr. Clydene Fake, MD  Patient Care Team: [name], MD as PCP - General (Internal Medicine) [name], MD as PCP - Cardiology (Cardiology) [name], MD as Consulting Physician (Internal Medicine) Ngetich, Nelda Bucks, NP as Nurse Practitioner (Family Medicine)  Extended Emergency Contact Information Primary Emergency Contact: Pallo,Bob Address: [address]           [place], [place] Phone: [phone] Relation: Son Secondary Emergency Contact: Philmore Pali [place] of [place] Phone: [phone] Mobile Phone: [phone] Relation: Daughter  Code Status:  DNR Goals of care: Advanced Directive information Advanced Directives 04/20/2019  Does Patient Have a Medical Advance Directive? Yes  Type of Advance Directive Out of facility DNR (pink MOST or yellow form);Living will  Does patient want to make changes to medical advance directive? No - Patient declined  Copy of [place] in Chart? -  Would patient like information on creating a medical advance directive? -  Pre-existing out of facility DNR order (yellow form or pink MOST form) Yellow form placed in chart (order not valid for inpatient use)     Chief Complaint  Patient presents with  . Acute Visit    Pulmonary Infintrate     HPI:  Pt is a 85 y.o. female seen today for an acute visit for Bilateral Infiltrate  She has a history of Takotsubo's cardiomyopathy with EF of 55 to 32%, diastolic dysfunction, hypertension, PAF not on any anticoagulation, SIADH, depression She has been in SNF since 3/04 after her Right hip fracture. She underwent InterMedullary Implant on 03/04.  Patient had Chest Xray done in facility and it showed Left Lower Lobe infiltrate with Small Effusion. We had treated her with Doxycyline. Repeat Chest  Xray shows Bilateral Infiltrate with continuous Effusion She is c/o Feeling Congested. Denies any SOB or Cough. No PND. Her weight is actually down. But she has swelling in her Lower legs. No fever or chills . No White count     Past Medical History:  Diagnosis Date  . Amputation of finger of right hand   . Aortic stenosis, mild 03/03/2016   By echo 08/2015  . Arthritis   . Back pain   . Bradycardia 08/28/2015  . CKD (chronic kidney disease) stage 3, GFR 30-59 ml/min (HCC)   . Closed right hip fracture ([facility]) 12/2018  . Depression   . GERD (gastroesophageal reflux disease)   . GI bleed due to NSAIDs   . Hyperlipidemia   . Hypertension   . Macular degeneration   . Maxillary sinusitis   . MVP (mitral valve prolapse) 03/03/2016   Bileaflet MVP with mild MR by echo 2016  . Old MI (myocardial infarction)    NSTEMI secondary to stress MI/Takotsubo CM, minimal nonobstructive ASCAD at cath  . Osteoporosis   . Persistent atrial fibrillation 2006   not on anticoagulation due to GI bleed and increased fall risk  . PMR (polymyalgia rheumatica) (HCC)   . SIADH (syndrome of inappropriate ADH production) ([facility])   . Takotsubo cardiomyopathy    EF normalized to 70%  . Ventricular tachycardia ([place])    on initial presentation of MI   Past Surgical History:  Procedure Laterality Date  . CARDIAC CATHETERIZATION     normal coronary arteries  . CHOLECYSTECTOMY  11/10/2011   Procedure: LAPAROSCOPIC CHOLECYSTECTOMY WITH INTRAOPERATIVE  CHOLANGIOGRAM;  Surgeon: Pedro Earls, MD;  [facility];  Service: General;  Laterality: N/A;  . EYE SURGERY  06/10/11   membrane peel   . INTRAMEDULLARY (IM) NAIL INTERTROCHANTERIC Right 12/21/2018   Procedure: INTRAMEDULLARY (IM) NAIL INTERTROCHANTRIC;  Surgeon: Nicholes Stairs, MD;  Location: [facility];  Service: Orthopedics;  Laterality: Right;  . NASAL SINUS SURGERY Left   . ROTATOR CUFF REPAIR  1998  . UMBILICAL HERNIA REPAIR  11/10/2011   Procedure:  HERNIA REPAIR UMBILICAL ADULT;  Surgeon: Pedro Earls, MD;  [facility];  Service: General;  Laterality: N/A;  . WISDOM TOOTH EXTRACTION      Allergies  Allergen Reactions  . Amlodipine Swelling    To feet and ankles   . Avelox [Moxifloxacin Hcl In Nacl] Other (See Comments)    Pt does not remember   . Biaxin [Clarithromycin]   . Ceftin [Cefuroxime Axetil]   . Duragesic Disc Transdermal System [Fentanyl] Other (See Comments)    Reaction unknown  . Morphine And Related Other (See Comments)    "My Sister and daughter can,t take it. I've had it before without problems."  . Parathyroid Hormone (Recomb) Other (See Comments)    Made skin dry  . Teriparatide   . Lidocaine Rash    Outpatient Encounter Medications as of 04/20/2019  Medication Sig  . acetaminophen (TYLENOL) 500 MG tablet Take 500 mg by mouth every 6 (six) hours as needed for mild pain, fever or headache.  . Amino Acids-Protein Hydrolys (FEEDING SUPPLEMENT, PRO-STAT SUGAR FREE 64,) LIQD Take 30 mLs by mouth daily.  Marland Kitchen amLODipine (NORVASC) 5 MG tablet TAKE 1 TABLET BY MOUTH EVERY DAY  . azelastine (ASTELIN) 0.1 % nasal spray Place 2 sprays into both nostrils 2 (two) times daily as needed for allergies.   . carboxymethylcellul-glycerin (LUBRICATING EYE DROPS) 0.5-0.9 % ophthalmic solution Place 1 drop into both eyes 3 (three) times daily as needed for dry eyes. For allergies  . carvedilol (COREG) 12.5 MG tablet Take 1 tablet (12.5 mg total) by mouth 2 (two) times daily with a meal.  . cetirizine (ZYRTEC) 10 MG tablet Take 10 mg by mouth daily.   Marland Kitchen demeclocycline (DECLOMYCIN) 150 MG tablet Take 150 mg by mouth 2 (two) times daily.   . diclofenac sodium (VOLTAREN) 1 % GEL Apply 1 application topically 4 (four) times daily as needed (pain).   . famotidine (PEPCID) 20 MG tablet Take 20 mg by mouth daily.  . Ferrous Sulfate (IRON) 325 (65 Fe) MG TABS Take 1 tablet by mouth every other day. Give with meals  . furosemide  (LASIX) 20 MG tablet Take 20 mg by mouth every other day.  . lactose free nutrition (BOOST) LIQD Take 237 mLs by mouth 2 (two) times daily between meals.  . memantine (NAMENDA) 5 MG tablet Take 5 mg by mouth daily.   . Multiple Vitamins-Minerals (CENTRUM CARDIO) TABS Take by mouth 2 (two) times daily.   . ondansetron (ZOFRAN) 4 MG tablet Take 1 tablet (4 mg total) by mouth every 6 (six) hours as needed for nausea.  . OXYGEN Inhale 2 L into the lungs 3 (three) times daily as needed.  . polyethylene glycol (MIRALAX / GLYCOLAX) packet Take 17 g by mouth daily as needed for mild constipation.   . QUEtiapine (SEROQUEL) 25 MG tablet Take 12.5 mg by mouth daily.  . traMADol (ULTRAM-ER) 100 MG 24 hr tablet Take 100 mg by mouth daily.  Marland Kitchen zinc oxide 20 %  ointment Apply 1 application topically as needed for irritation. Apply to peri area as needed  . buPROPion (WELLBUTRIN SR) 150 MG 12 hr tablet Take 1 tablet (150 mg total) by mouth every morning.   No facility-administered encounter medications on file as of 04/20/2019.     Review of Systems  Constitutional: Negative.   HENT: Positive for congestion.   Respiratory: Positive for cough.   Cardiovascular: Positive for leg swelling.  Genitourinary: Negative.   Musculoskeletal: Positive for back pain.  Neurological: Positive for weakness.  Psychiatric/Behavioral: Positive for confusion.  All other systems reviewed and are negative.   Immunization History  Administered Date(s) Administered  . Influenza, High Dose Seasonal PF 07/12/2017   Pertinent  Health Maintenance Due  Topic Date Due  . DEXA SCAN  02/20/1990  . PNA vac Low Risk Adult (1 of 2 - PCV13) 02/20/1990  . INFLUENZA VACCINE  05/20/2019   No flowsheet data found. Functional Status Survey:    Vitals:   04/20/19 1125  BP: 136/64  Pulse: 70  Resp: 18  Temp: (!) 97.1 F (36.2 C)  TempSrc: Oral  SpO2: 93%  Weight: 108 lb 4.8 oz (49.1 kg)  Height: 5' (1.524 m)   Body mass index  is 21.15 kg/m. Physical Exam Vitals signs reviewed.  HENT:     Head: Normocephalic.     Nose: Nose normal.     Mouth/Throat:     Mouth: Mucous membranes are moist.     Pharynx: Oropharynx is clear.  Eyes:     Pupils: Pupils are equal, round, and reactive to light.  Neck:     Musculoskeletal: Neck supple.  Cardiovascular:     Rate and Rhythm: Normal rate. Rhythm irregular.     Pulses: Normal pulses.  Pulmonary:     Effort: Pulmonary effort is normal.     Comments: Few rales Bilateral Abdominal:     General: Abdomen is flat. Bowel sounds are normal.     Palpations: Abdomen is soft.  Musculoskeletal:     Comments: Mild swelling   Skin:    General: Skin is warm and dry.  Neurological:     General: No focal deficit present.     Mental Status: She is alert.  Psychiatric:        Mood and Affect: Mood normal.        Thought Content: Thought content normal.     Labs reviewed: Recent Labs    12/21/18 0019 12/22/18 0304 12/23/18 0207 12/29/18 01/30/19 03/30/19  NA 136 136 132* 136* 137 136*  K 5.0 4.1 4.2 4.0 4.7 4.4  CL 106 103 102  --   --   --   CO2 23 25 25   --   --   --   GLUCOSE 140* 137* 110*  --   --   --   BUN 35* 14 20 24* 25* 25*  CREATININE 1.21* 0.91 1.37* 0.9 1.2* 1.1  CALCIUM 8.9 8.3* 8.4*  --   --   --   MG  --  1.8  --   --   --   --    Recent Labs    12/22/18 0304 12/23/18 0207 03/30/19  AST 86* 53* 20  ALT 64* 37 11  ALKPHOS 82 69 92  BILITOT 0.6 0.7  --   PROT 5.6* 5.4*  --   ALBUMIN 2.6* 2.3*  --    Recent Labs    12/21/18 0019 12/22/18 0304 12/23/18 0207 01/30/19 02/16/19 02/20/19 03/30/19  WBC 10.4 10.4 10.9* 7.6 10.8  --  7.9  NEUTROABS 7.5 9.4* 7.3  --   --   --   --   HGB 11.3* 9.3* 8.0* 9.5* 10.2*  10.2* 9.8* 9.8*  HCT 35.8* 28.7* 25.4* 28* 31*  31* 29* 30*  MCV 93.2 93.8 93.7  --   --   --   --   PLT 319 234 189 431* 387  387 419* 532*   Lab Results  Component Value Date   TSH 2.600 09/19/2018    Significant  Diagnostic Results in last 30 days:   Assessment/Plan Bilateral Infiltrate with Left Pleural effusion ? CHF Will discontinue Lasix And start her on 20 mg of Demadex Repeat BMP and BNP  Will repeat Chest Xray and if her infilterate not improved will get CT scan of Chest    Family/ staff Communication:   Labs/tests ordered:   Total time spent in this patient care encounter was  25_  minutes; greater than 50% of the visit spent counseling patient and staff, reviewing records , Labs and coordinating care for problems addressed at this encounter.

## 2019-04-25 LAB — BASIC METABOLIC PANEL
BUN: 48 — AB (ref 4–21)
Creatinine: 1.1 (ref 0.5–1.1)
Glucose: 111
Potassium: 4.2 (ref 3.4–5.3)
Sodium: 137 (ref 137–147)

## 2019-05-10 ENCOUNTER — Non-Acute Institutional Stay (SKILLED_NURSING_FACILITY): Payer: Medicare Other | Admitting: Family

## 2019-05-10 ENCOUNTER — Encounter: Payer: Self-pay | Admitting: Family

## 2019-05-10 DIAGNOSIS — D649 Anemia, unspecified: Secondary | ICD-10-CM | POA: Diagnosis not present

## 2019-05-10 DIAGNOSIS — B3789 Other sites of candidiasis: Secondary | ICD-10-CM

## 2019-05-10 DIAGNOSIS — J302 Other seasonal allergic rhinitis: Secondary | ICD-10-CM

## 2019-05-10 DIAGNOSIS — I5181 Takotsubo syndrome: Secondary | ICD-10-CM

## 2019-05-10 DIAGNOSIS — I10 Essential (primary) hypertension: Secondary | ICD-10-CM

## 2019-05-10 DIAGNOSIS — I4819 Other persistent atrial fibrillation: Secondary | ICD-10-CM | POA: Diagnosis not present

## 2019-05-10 DIAGNOSIS — E222 Syndrome of inappropriate secretion of antidiuretic hormone: Secondary | ICD-10-CM

## 2019-05-10 NOTE — Progress Notes (Addendum)
Location:  [facility] Room Number: 20 Place of Service:  SNF [phone]) Provider: Kamarian Sahakian FNP-C   [name], MD  Patient Care Team: [name], MD as PCP - General (Internal Medicine) [name], MD as PCP - Cardiology (Cardiology) [name], MD as Consulting Physician (Internal Medicine) Derrious Bologna, Nelda Bucks, NP as Nurse Practitioner (Family Medicine)  Extended Emergency Contact Information Primary Emergency Contact: Jaster,Bob Address: [address]           [place], [place] Phone: [phone] Relation: Son Secondary Emergency Contact: Philmore [place] Phone: [phone] Mobile Phone: [phone] Relation: Daughter  Code Status:  DNR  Goals of care: Advanced Directive information Advanced Directives 05/10/2019  Does Patient Have a Medical Advance Directive? Yes  Type of Advance Directive Out of facility DNR (pink MOST or yellow form);Living will  Does patient want to make changes to medical advance directive? No - Patient declined  Copy of [place] in Chart? -  Would patient like information on creating a medical advance directive? -  Pre-existing out of facility DNR order (yellow form or pink MOST form) Yellow form placed in chart (order not valid for inpatient use)     Chief Complaint  Patient presents with   Medical Management of Chronic Issues    Routine Visit    HPI:  Pt is a 85 y.o. female seen today [facility] for medical management of chronic diseases.she has a medical history of Hypertension,Takotsubo cardiomyopathy,Afib,SIADH,GERD,depression, post right hip IM implant 12/21/2018 post right hip fracture among other condition.she has work with Therapy ambulates with assistance but spends most time on wheelchair.she self propel with her feet.she remains high risk for falls but no recent falls reported.No recent weight loss.she is  more alert and oriented this visit answering questions appropriately.Facility Nurse reports no new concerns.she complains of nasal congestion states has hx of allergies and use nasal spray as needed but would like it scheduled so that the nurse can bring to her instead of having to ask for it.     Past Medical History:  Diagnosis Date   Amputation of finger of right hand    Aortic stenosis, mild 03/03/2016   By echo 08/2015   Arthritis    Back pain    Bradycardia 08/28/2015   CKD (chronic kidney disease) stage 3, GFR 30-59 ml/min (HCC)    Closed right hip fracture (HCC) 12/2018   Depression    GERD (gastroesophageal reflux disease)    GI bleed due to NSAIDs    Hyperlipidemia    Hypertension    Macular degeneration    Maxillary sinusitis    MVP (mitral valve prolapse) 03/03/2016   Bileaflet MVP with mild MR by echo 2016   Old MI (myocardial infarction)    NSTEMI secondary to stress MI/Takotsubo CM, minimal nonobstructive ASCAD at cath   Osteoporosis    Persistent atrial fibrillation 2006   not on anticoagulation due to GI bleed and increased fall risk   PMR (polymyalgia rheumatica) (HCC)    SIADH (syndrome of inappropriate ADH production) ([place])    Takotsubo cardiomyopathy    EF normalized to 70%   Ventricular tachycardia ([facility])    on initial presentation of MI   Past Surgical History:  Procedure Laterality Date   CARDIAC CATHETERIZATION     normal coronary arteries   CHOLECYSTECTOMY  11/10/2011   Procedure: LAPAROSCOPIC CHOLECYSTECTOMY WITH INTRAOPERATIVE CHOLANGIOGRAM;  Surgeon: Pedro Earls, MD;  Location: [facility];  Service: General;  Laterality: N/A;   EYE SURGERY  06/10/11   membrane peel    INTRAMEDULLARY (IM) NAIL INTERTROCHANTERIC Right 12/21/2018   Procedure: INTRAMEDULLARY (IM) NAIL INTERTROCHANTRIC;  Surgeon: Nicholes Stairs, MD;  Location: [facility];  Service: Orthopedics;  Laterality: Right;   NASAL SINUS SURGERY Left    ROTATOR CUFF  REPAIR  [id number]   UMBILICAL HERNIA REPAIR  11/10/2011   Procedure: HERNIA REPAIR UMBILICAL ADULT;  Surgeon: Pedro Earls, MD;  Location: [facility];  Service: General;  Laterality: N/A;   WISDOM TOOTH EXTRACTION      Allergies  Allergen Reactions   Amlodipine Swelling    To feet and ankles    Avelox [Moxifloxacin Hcl In Nacl] Other (See Comments)    Pt does not remember    Biaxin [Clarithromycin]    Ceftin [Cefuroxime Axetil]    Duragesic Disc Transdermal System [Fentanyl] Other (See Comments)    Reaction unknown   Morphine And Related Other (See Comments)    "My Sister and daughter can,t take it. I've had it before without problems."   Parathyroid Hormone (Recomb) Other (See Comments)    Made skin dry   Teriparatide    Lidocaine Rash    Allergies as of 05/10/2019      Reactions   Amlodipine Swelling   To feet and ankles    Avelox [moxifloxacin Hcl In Nacl] Other (See Comments)   Pt does not remember   Biaxin [clarithromycin]    Ceftin [cefuroxime Axetil]    Duragesic Disc Transdermal System [fentanyl] Other (See Comments)   Reaction unknown   Morphine And Related Other (See Comments)   "My Sister and daughter can,t take it. I've had it before without problems."   Parathyroid Hormone (recomb) Other (See Comments)   Made skin dry   Teriparatide    Lidocaine Rash      Medication List       Accurate as of May 10, 2019  1:36 PM. If you have any questions, ask your nurse or doctor.        STOP taking these medications   furosemide 20 MG tablet Commonly known as: LASIX Stopped by: Nelda Bucks Phillippa Straub, NP   traMADol 100 MG 24 hr tablet Commonly known as: ULTRAM-ER Stopped by: Sandrea Hughs, NP     TAKE these medications   acetaminophen 500 MG tablet Commonly known as: TYLENOL Take 500 mg by mouth every 6 (six) hours as needed for mild pain, fever or headache.   acetaminophen 500 MG tablet Commonly known as: TYLENOL Take 500 mg by mouth daily.     albuterol (2.5 MG/3ML) 0.083% nebulizer solution Commonly known as: PROVENTIL Take 2.5 mg by nebulization every 6 (six) hours as needed for wheezing or shortness of breath.   amLODipine 5 MG tablet Commonly known as: NORVASC TAKE 1 TABLET BY MOUTH EVERY DAY   azelastine 0.1 % nasal spray Commonly known as: ASTELIN Place 2 sprays into both nostrils 2 (two) times daily as needed for allergies.   buPROPion 150 MG 12 hr tablet Commonly known as: WELLBUTRIN SR Take 1 tablet (150 mg total) by mouth every morning.   carvedilol 12.5 MG tablet Commonly known as: COREG Take 1 tablet (12.5 mg total) by mouth 2 (two) times daily with a meal.   Centrum Cardio Tabs Take by mouth 2 (two) times daily.   cetirizine 10 MG tablet Commonly known as: ZYRTEC Take 10 mg  by mouth daily.   demeclocycline 150 MG tablet Commonly known as: DECLOMYCIN Take 150 mg by mouth 2 (two) times daily.   diclofenac sodium 1 % Gel Commonly known as: VOLTAREN Apply 1 application topically 4 (four) times daily as needed (pain).   famotidine 20 MG tablet Commonly known as: PEPCID Take 20 mg by mouth daily.   feeding supplement (PRO-STAT SUGAR FREE 64) Liqd Take 30 mLs by mouth daily.   Iron 325 (65 Fe) MG Tabs Take 1 tablet by mouth every other day. Give with meals   lactose free nutrition Liqd Take 237 mLs by mouth 2 (two) times daily between meals.   Lubricating Eye Drops 0.5-0.9 % ophthalmic solution Generic drug: carboxymethylcellul-glycerin Place 1 drop into both eyes 3 (three) times daily as needed for dry eyes. For allergies   memantine 5 MG tablet Commonly known as: NAMENDA Take 5 mg by mouth daily.   mirtazapine 7.5 MG tablet Commonly known as: REMERON Take 7.5 mg by mouth at bedtime.   ondansetron 4 MG tablet Commonly known as: ZOFRAN Take 1 tablet (4 mg total) by mouth every 6 (six) hours as needed for nausea.   OXYGEN Inhale 2 L into the lungs 3 (three) times daily as needed.    polyethylene glycol 17 g packet Commonly known as: MIRALAX / GLYCOLAX Take 17 g by mouth daily as needed for mild constipation.   QUEtiapine 25 MG tablet Commonly known as: SEROQUEL Take 12.5 mg by mouth daily.   torsemide 20 MG tablet Commonly known as: DEMADEX Take 20 mg by mouth daily.   zinc oxide 20 % ointment Apply 1 application topically as needed for irritation. Apply to peri area as needed       Review of Systems  Unable to perform ROS: Dementia (additional information provided by facility Nurse )  Constitutional: Negative for appetite change, chills, fatigue and fever.  HENT: Positive for congestion. Negative for postnasal drip, rhinorrhea, sinus pressure, sinus pain, sneezing and sore throat.   Eyes: Negative for discharge, redness and itching.  Respiratory: Negative for cough, chest tightness, shortness of breath and wheezing.   Cardiovascular: Positive for leg swelling. Negative for chest pain and palpitations.  Gastrointestinal: Negative for abdominal distention, abdominal pain, constipation, diarrhea, nausea and vomiting.  Endocrine: Negative for cold intolerance, heat intolerance, polydipsia, polyphagia and polyuria.  Genitourinary: Negative for difficulty urinating, dysuria, flank pain, frequency and urgency.  Musculoskeletal: Positive for gait problem.  Skin: Negative for color change, pallor, rash and wound.  Neurological: Negative for dizziness, weakness, light-headedness, numbness and headaches.  Hematological: Does not bruise/bleed easily.  Psychiatric/Behavioral: Negative for agitation, behavioral problems, hallucinations and sleep disturbance. The patient is not nervous/anxious.     Immunization History  Administered Date(s) Administered   Influenza, High Dose Seasonal PF 07/12/2017   Pertinent  Health Maintenance Due  Topic Date Due   DEXA SCAN  02/20/1990   PNA vac Low Risk Adult (1 of 2 - PCV13) 02/20/1990   INFLUENZA VACCINE  05/20/2019    No flowsheet data found. Functional Status Survey:    Vitals:   05/10/19 0859  BP: 130/60  Pulse: 62  Resp: 18  Temp: 98.2 F (36.8 C)  TempSrc: Oral  SpO2: 95%  Weight: 112 lb 11.2 oz (51.1 kg)  Height: 5' (1.524 m)   Body mass index is 22.01 kg/m. Physical Exam Vitals signs and nursing note reviewed.  Constitutional:      General: She is not in acute distress.  Appearance: She is normal weight. She is not ill-appearing.  HENT:     Right Ear: Tympanic membrane, ear canal and external ear normal. There is no impacted cerumen.     Left Ear: Tympanic membrane, ear canal and external ear normal. There is no impacted cerumen.     Nose: Congestion present. No rhinorrhea.     Mouth/Throat:     Mouth: Mucous membranes are moist.     Pharynx: Oropharynx is clear. No oropharyngeal exudate or posterior oropharyngeal erythema.  Eyes:     General: No scleral icterus.       Right eye: No discharge.        Left eye: No discharge.     Conjunctiva/sclera: Conjunctivae normal.     Pupils: Pupils are equal, round, and reactive to light.  Neck:     Musculoskeletal: Normal range of motion. No neck rigidity or muscular tenderness.  Cardiovascular:     Rate and Rhythm: Normal rate. Rhythm irregular.     Pulses: Normal pulses.     Heart sounds: Normal heart sounds. No murmur. No friction rub. No gallop.   Pulmonary:     Effort: Pulmonary effort is normal. No respiratory distress.     Breath sounds: Normal breath sounds. No wheezing, rhonchi or rales.  Chest:     Chest wall: No tenderness.  Abdominal:     General: Bowel sounds are normal. There is no distension.     Palpations: Abdomen is soft.     Tenderness: There is no abdominal tenderness. There is no right CVA tenderness, left CVA tenderness, guarding or rebound.     Hernia: A hernia is present.     Comments: Umbilical hernia reducible.   Musculoskeletal:        General: No swelling or tenderness.     Comments: Unsteady gait  self propels on wheelchair.chronic Limited ROM to both shoulders.No tender to palpation.arthritic changes to fingers.  Right leg trace edema noted.   Lymphadenopathy:     Cervical: No cervical adenopathy.  Skin:    General: Skin is warm and dry.     Coloration: Skin is not pale.     Findings: No bruising, lesion or rash.     Comments: Right breast skin fold beefy skin redness noted  Neurological:     Mental Status: She is alert. Mental status is at baseline.     Cranial Nerves: No cranial nerve deficit.     Sensory: No sensory deficit.     Motor: No weakness.     Gait: Gait abnormal.  Psychiatric:        Mood and Affect: Mood normal.        Behavior: Behavior normal.        Thought Content: Thought content normal.        Judgment: Judgment normal.     Labs reviewed: Recent Labs    12/21/18 0019 12/22/18 0304 12/23/18 0207 12/29/18 01/30/19 03/30/19  NA 136 136 132* 136* 137 136*  K 5.0 4.1 4.2 4.0 4.7 4.4  CL 106 103 102  --   --   --   CO2 23 25 25   --   --   --   GLUCOSE 140* 137* 110*  --   --   --   BUN 35* 14 20 24* 25* 25*  CREATININE 1.21* 0.91 1.37* 0.9 1.2* 1.1  CALCIUM 8.9 8.3* 8.4*  --   --   --   MG  --  1.8  --   --   --   --  Recent Labs    12/22/18 0304 12/23/18 0207 03/30/19  AST 86* 53* 20  ALT 64* 37 11  ALKPHOS 82 69 92  BILITOT 0.6 0.7  --   PROT 5.6* 5.4*  --   ALBUMIN 2.6* 2.3*  --    Recent Labs    12/21/18 0019 12/22/18 0304 12/23/18 0207 01/30/19 02/16/19 02/20/19 03/30/19  WBC 10.4 10.4 10.9* 7.6 10.8  --  7.9  NEUTROABS 7.5 9.4* 7.3  --   --   --   --   HGB 11.3* 9.3* 8.0* 9.5* 10.2*   10.2* 9.8* 9.8*  HCT 35.8* 28.7* 25.4* 28* 31*   31* 29* 30*  MCV 93.2 93.8 93.7  --   --   --   --   PLT 319 234 189 431* 387   387 419* 532*   Lab Results  Component Value Date   TSH 2.600 09/19/2018   No results found for: HGBA1C No results found for: CHOL, HDL, LDLCALC, LDLDIRECT, TRIG, CHOLHDL  Significant Diagnostic Results in last  30 days:  No results found.  Assessment/Plan 1. Essential hypertension, benign B/p reviewed stable 110's/60's -140's/60's.continue on amlodipine 5 mg tablet daily,coreg 12.5 mg tablet twice daily and Torsemide 20 mg tablet daily.CR at baseline (03/30/2019).Not on ASA and statin due to her advance age and high risk for falls.   2. Persistent atrial fibrillation (HCC) HR irregular.continue on coreg 12.5 mg tablet twice daily.  3. Takotsubo cardiomyopathy Her weight stable.trace edema to right leg.continue on Torsemide 20 mg tablet daily.  4. Anemia, unspecified type Hgb 9.8 (03/30/2019) no changes from previous level.continue on ferrous sulfate 325 mg tablet every other day.on miralax as needed for constipation.monitor her H/H.  5. SIADH (syndrome of inappropriate ADH production) (HCC) Na 136 (03/30/2019).continue on declomycin 150 mg tablet twice daily.   6. Seasonal allergies Reports nasal congestion.request her Azelastine nasal spray to be changed to schedule instead of as needed.Disconstinue Azelastine nasal spray PRN then  Start Azelastine 0.1 %  nasal spray two sprays into both nostrils twice daily.   7.Candidiasis of right breast  Beefy redness noted on breast fold.Apply one application of Nystatin 100,000 units topically twice daily x 14 days.   Family/ staff Communication: Reviewed plan of care with patient and facility Nurse.    Labs/tests ordered: None   Hellen Shanley C Regis Hinton, NP

## 2019-05-19 ENCOUNTER — Encounter: Payer: Self-pay | Admitting: Nurse Practitioner

## 2019-05-19 ENCOUNTER — Non-Acute Institutional Stay (SKILLED_NURSING_FACILITY): Payer: Medicare Other | Admitting: Nurse Practitioner

## 2019-05-19 DIAGNOSIS — R600 Localized edema: Secondary | ICD-10-CM

## 2019-05-19 DIAGNOSIS — E222 Syndrome of inappropriate secretion of antidiuretic hormone: Secondary | ICD-10-CM | POA: Diagnosis not present

## 2019-05-19 DIAGNOSIS — B3789 Other sites of candidiasis: Secondary | ICD-10-CM | POA: Diagnosis not present

## 2019-05-19 DIAGNOSIS — R4189 Other symptoms and signs involving cognitive functions and awareness: Secondary | ICD-10-CM | POA: Diagnosis not present

## 2019-05-19 NOTE — Assessment & Plan Note (Signed)
Trace edema BLE, continue TED, Torsemide 20mg  qd.

## 2019-05-19 NOTE — Assessment & Plan Note (Signed)
Continue [facility] for safety and care assistance, continue Memantine 5mg  bid for memory.

## 2019-05-19 NOTE — Progress Notes (Signed)
Location:   SNF [place] Room Number: 67 Place of Service:  SNF (31) Provider: [facility] Milynn Quirion NP  [name], MD  Patient Care Team: [name], MD as PCP - General (Internal Medicine) [name], MD as PCP - Cardiology (Cardiology) [name], MD as Consulting Physician (Internal Medicine) Ngetich, Nelda Bucks, NP as Nurse Practitioner (Family Medicine)  Extended Emergency Contact Information Primary Emergency Contact: Vezina,Bob Address: [address]           [place], [place] Phone: [phone] Relation: Son Secondary Emergency Contact: Philmore [place] Phone: [phone] Mobile Phone: [phone] Relation: Daughter  Code Status:  DNR Goals of care: Advanced Directive information Advanced Directives 05/19/2019  Does Patient Have a Medical Advance Directive? Yes  Type of Advance Directive Living will;Out of facility DNR (pink MOST or yellow form)  Does patient want to make changes to medical advance directive? No - Patient declined  Copy of [place] in Chart? Yes - validated most recent copy scanned in chart (See row information)  Would patient like information on creating a medical advance directive? No - Patient declined  Pre-existing out of facility DNR order (yellow form or pink MOST form) Yellow form placed in chart (order not valid for inpatient use)     Chief Complaint  Patient presents with  . Acute Visit    C/o - rash    HPI:  Pt is a 85 y.o. female seen today for an acute visit for groins area rash, she is incontinent of urine, uses adult brief for urinary leakage. Hx of  trace edema in ankles, on Torsemide 20mg  qd. She resides in [facility] for safety and care assistance, self transfer, w/c for mobility, on Memantine 5mg  bid. She takes Demeclocycline 150mg  bid, last Na. 136 03/30/19.    Past Medical History:  Diagnosis Date  . Amputation of finger of  right hand   . Aortic stenosis, mild 03/03/2016   By echo 08/2015  . Arthritis   . Back pain   . Bradycardia 08/28/2015  . CKD (chronic kidney disease) stage 3, GFR 30-59 ml/min (HCC)   . Closed right hip fracture ([place]) 12/2018  . Depression   . GERD (gastroesophageal reflux disease)   . GI bleed due to NSAIDs   . Hyperlipidemia   . Hypertension   . Macular degeneration   . Maxillary sinusitis   . MVP (mitral valve prolapse) 03/03/2016   Bileaflet MVP with mild MR by echo 2016  . Old MI (myocardial infarction)    NSTEMI secondary to stress MI/Takotsubo CM, minimal nonobstructive ASCAD at cath  . Osteoporosis   . Persistent atrial fibrillation 2006   not on anticoagulation due to GI bleed and increased fall risk  . PMR (polymyalgia rheumatica) (HCC)   . SIADH (syndrome of inappropriate ADH production) ([place])   . Takotsubo cardiomyopathy    EF normalized to 70%  . Ventricular tachycardia ([place])    on initial presentation of MI   Past Surgical History:  Procedure Laterality Date  . CARDIAC CATHETERIZATION     normal coronary arteries  . CHOLECYSTECTOMY  11/10/2011   Procedure: LAPAROSCOPIC CHOLECYSTECTOMY WITH INTRAOPERATIVE CHOLANGIOGRAM;  Surgeon: Pedro Earls, MD;  Location: [facility];  Service: General;  Laterality: N/A;  . EYE SURGERY  06/10/11   membrane peel   . INTRAMEDULLARY (IM) NAIL INTERTROCHANTERIC Right 12/21/2018   Procedure: INTRAMEDULLARY (IM) NAIL INTERTROCHANTRIC;  Surgeon:  Nicholes Stairs, MD;  Location: [place];  Service: Orthopedics;  Laterality: Right;  . NASAL SINUS SURGERY Left   . ROTATOR CUFF REPAIR  1998  . UMBILICAL HERNIA REPAIR  11/10/2011   Procedure: HERNIA REPAIR UMBILICAL ADULT;  Surgeon: Pedro Earls, MD;  Location: [facility];  Service: General;  Laterality: N/A;  . WISDOM TOOTH EXTRACTION      Allergies  Allergen Reactions  . Amlodipine Swelling    To feet and ankles   . Avelox [Moxifloxacin Hcl In Nacl] Other (See Comments)    Pt does  not remember   . Biaxin [Clarithromycin]   . Ceftin [Cefuroxime Axetil]   . Duragesic Disc Transdermal System [Fentanyl] Other (See Comments)    Reaction unknown  . Morphine And Related Other (See Comments)    "My Sister and daughter can,t take it. I've had it before without problems."  . Parathyroid Hormone (Recomb) Other (See Comments)    Made skin dry  . Teriparatide   . Lidocaine Rash    Allergies as of 05/19/2019      Reactions   Amlodipine Swelling   To feet and ankles    Avelox [moxifloxacin Hcl In Nacl] Other (See Comments)   Pt does not remember   Biaxin [clarithromycin]    Ceftin [cefuroxime Axetil]    Duragesic Disc Transdermal System [fentanyl] Other (See Comments)   Reaction unknown   Morphine And Related Other (See Comments)   "My Sister and daughter can,t take it. I've had it before without problems."   Parathyroid Hormone (recomb) Other (See Comments)   Made skin dry   Teriparatide    Lidocaine Rash      Medication List       Accurate as of May 19, 2019  2:46 PM. If you have any questions, ask your nurse or doctor.        acetaminophen 500 MG tablet Commonly known as: TYLENOL Take 500 mg by mouth every 6 (six) hours as needed for mild pain, fever or headache.   acetaminophen 500 MG tablet Commonly known as: TYLENOL Take 500 mg by mouth daily.   albuterol (2.5 MG/3ML) 0.083% nebulizer solution Commonly known as: PROVENTIL Take 2.5 mg by nebulization every 6 (six) hours as needed for wheezing or shortness of breath.   amLODipine 5 MG tablet Commonly known as: NORVASC TAKE 1 TABLET BY MOUTH EVERY DAY   azelastine 0.1 % nasal spray Commonly known as: ASTELIN Place 2 sprays into both nostrils 2 (two) times daily as needed for allergies.   buPROPion 150 MG 12 hr tablet Commonly known as: WELLBUTRIN SR Take 1 tablet (150 mg total) by mouth every morning.   carvedilol 12.5 MG tablet Commonly known as: COREG Take 1 tablet (12.5 mg total) by  mouth 2 (two) times daily with a meal.   Centrum Cardio Tabs Take by mouth 2 (two) times daily.   cetirizine 10 MG tablet Commonly known as: ZYRTEC Take 10 mg by mouth daily.   demeclocycline 150 MG tablet Commonly known as: DECLOMYCIN Take 150 mg by mouth 2 (two) times daily.   diclofenac sodium 1 % Gel Commonly known as: VOLTAREN Apply 1 application topically 4 (four) times daily as needed (pain).   famotidine 20 MG tablet Commonly known as: PEPCID Take 20 mg by mouth daily.   feeding supplement (PRO-STAT SUGAR FREE 64) Liqd Take 30 mLs by mouth daily.   Iron 325 (65 Fe) MG Tabs Take 1 tablet by mouth every other day.  Give with meals   lactose free nutrition Liqd Take 237 mLs by mouth 2 (two) times daily between meals.   Lubricating Eye Drops 0.5-0.9 % ophthalmic solution Generic drug: carboxymethylcellul-glycerin Place 1 drop into both eyes 3 (three) times daily as needed for dry eyes. For allergies   memantine 5 MG tablet Commonly known as: NAMENDA Take 5 mg by mouth daily.   mirtazapine 7.5 MG tablet Commonly known as: REMERON Take 7.5 mg by mouth at bedtime.   nystatin cream Commonly known as: MYCOSTATIN Apply 1 application topically 2 (two) times daily.   ondansetron 4 MG tablet Commonly known as: ZOFRAN Take 1 tablet (4 mg total) by mouth every 6 (six) hours as needed for nausea.   OXYGEN Inhale 2 L into the lungs 3 (three) times daily as needed.   polyethylene glycol 17 g packet Commonly known as: MIRALAX / GLYCOLAX Take 17 g by mouth daily as needed for mild constipation.   QUEtiapine 25 MG tablet Commonly known as: SEROQUEL Take 12.5 mg by mouth daily.   torsemide 20 MG tablet Commonly known as: DEMADEX Take 20 mg by mouth daily.   zinc oxide 20 % ointment Apply 1 application topically as needed for irritation. Apply to peri area as needed      ROS was provided with assistance of staff Review of Systems  Constitutional: Negative for  activity change, appetite change, chills, diaphoresis, fatigue and fever.  HENT: Positive for hearing loss. Negative for congestion and voice change.   Respiratory: Positive for shortness of breath. Negative for cough and wheezing.        DOE  Cardiovascular: Positive for leg swelling. Negative for chest pain and palpitations.  Gastrointestinal: Negative for abdominal distention, abdominal pain, constipation, diarrhea, nausea and vomiting.  Genitourinary: Negative for difficulty urinating, dysuria and urgency.       Stable hemorrhoids.   Musculoskeletal: Positive for gait problem.  Skin: Positive for rash.  Neurological: Negative for dizziness, speech difficulty, weakness and headaches.       Memory lapses.   Psychiatric/Behavioral: Negative for agitation, hallucinations and sleep disturbance. The patient is not nervous/anxious.     Immunization History  Administered Date(s) Administered  . Influenza, High Dose Seasonal PF 07/12/2017   Pertinent  Health Maintenance Due  Topic Date Due  . DEXA SCAN  02/20/1990  . PNA vac Low Risk Adult (1 of 2 - PCV13) 02/20/1990  . INFLUENZA VACCINE  05/20/2019   No flowsheet data found. Functional Status Survey:    Vitals:   05/19/19 1220  BP: 138/69  Pulse: 72  Resp: 20  Temp: (!) 97.3 F (36.3 C)  SpO2: 95%  Weight: 113 lb 9.6 oz (51.5 kg)   Body mass index is 22.19 kg/m. Physical Exam Constitutional:      General: She is not in acute distress.    Appearance: Normal appearance. She is not ill-appearing, toxic-appearing or diaphoretic.  HENT:     Head: Normocephalic and atraumatic.     Nose: Nose normal.     Mouth/Throat:     Mouth: Mucous membranes are moist.  Eyes:     Extraocular Movements: Extraocular movements intact.     Conjunctiva/sclera: Conjunctivae normal.     Pupils: Pupils are equal, round, and reactive to light.  Neck:     Musculoskeletal: Normal range of motion.  Cardiovascular:     Rate and Rhythm: Normal  rate. Rhythm irregular.     Heart sounds: Murmur present.  Pulmonary:  Effort: Pulmonary effort is normal.     Breath sounds: Rales present. No wheezing or rhonchi.     Comments: Bibasilar rales.  Abdominal:     General: There is no distension.     Palpations: Abdomen is soft.     Tenderness: There is no abdominal tenderness. There is no right CVA tenderness, left CVA tenderness, guarding or rebound.     Comments: Umbilical hernia.   Genitourinary:    Comments: External hemorrhoids stable.  Musculoskeletal:     Right lower leg: Edema present.     Left lower leg: Edema present.     Comments: Trace edema   Skin:    General: Skin is warm and dry.     Findings: Rash present.     Comments: Groins R+L redness  Neurological:     General: No focal deficit present.     Mental Status: She is alert. Mental status is at baseline.     Cranial Nerves: No cranial nerve deficit.     Motor: No weakness.     Coordination: Coordination normal.     Gait: Gait abnormal.     Comments: Oriented to person and place.   Psychiatric:        Mood and Affect: Mood normal.        Behavior: Behavior normal.     Labs reviewed: Recent Labs    12/21/18 0019 12/22/18 0304 12/23/18 0207 12/29/18 01/30/19 03/30/19  NA 136 136 132* 136* 137 136*  K 5.0 4.1 4.2 4.0 4.7 4.4  CL 106 103 102  --   --   --   CO2 23 25 25   --   --   --   GLUCOSE 140* 137* 110*  --   --   --   BUN 35* 14 20 24* 25* 25*  CREATININE 1.21* 0.91 1.37* 0.9 1.2* 1.1  CALCIUM 8.9 8.3* 8.4*  --   --   --   MG  --  1.8  --   --   --   --    Recent Labs    12/22/18 0304 12/23/18 0207 03/30/19  AST 86* 53* 20  ALT 64* 37 11  ALKPHOS 82 69 92  BILITOT 0.6 0.7  --   PROT 5.6* 5.4*  --   ALBUMIN 2.6* 2.3*  --    Recent Labs    12/21/18 0019 12/22/18 0304 12/23/18 0207 01/30/19 02/16/19 02/20/19 03/30/19  WBC 10.4 10.4 10.9* 7.6 10.8  --  7.9  NEUTROABS 7.5 9.4* 7.3  --   --   --   --   HGB 11.3* 9.3* 8.0* 9.5* 10.2*   10.2* 9.8* 9.8*  HCT 35.8* 28.7* 25.4* 28* 31*  31* 29* 30*  MCV 93.2 93.8 93.7  --   --   --   --   PLT 319 234 189 431* 387  387 419* 532*   Lab Results  Component Value Date   TSH 2.600 09/19/2018   No results found for: HGBA1C No results found for: CHOL, HDL, LDLCALC, LDLDIRECT, TRIG, CHOLHDL  Significant Diagnostic Results in last 30 days:  No results found.  Assessment/Plan [place] rash of groin Will apply Nystatin powder bid to groin area R+L bid x 2 weeks.   SIADH (syndrome of inappropriate ADH production) (HCC) Last Na was wnl, 136 03/30/19, continue Demeclocycline 150mg  bid.   Edema of extremities Trace edema BLE, continue TED, Torsemide 20mg  qd.   Cognitive impairment Continue SNF [facility] for safety and  care assistance, continue Memantine 5mg  bid for memory.     Family/ staff Communication: plan of care reviewed with the patient and charge nurse.   Labs/tests ordered: none  Time spend 25 minutes.

## 2019-05-19 NOTE — Assessment & Plan Note (Signed)
Last Na was wnl, 136 03/30/19, continue Demeclocycline 150mg  bid.

## 2019-05-19 NOTE — Assessment & Plan Note (Signed)
Will apply Nystatin powder bid to groin area R+L bid x 2 weeks.

## 2019-06-02 ENCOUNTER — Non-Acute Institutional Stay (SKILLED_NURSING_FACILITY): Payer: Medicare Other | Admitting: Nurse Practitioner

## 2019-06-02 ENCOUNTER — Other Ambulatory Visit: Payer: Self-pay | Admitting: *Deleted

## 2019-06-02 ENCOUNTER — Encounter: Payer: Self-pay | Admitting: Nurse Practitioner

## 2019-06-02 DIAGNOSIS — R441 Visual hallucinations: Secondary | ICD-10-CM

## 2019-06-02 DIAGNOSIS — D649 Anemia, unspecified: Secondary | ICD-10-CM

## 2019-06-02 DIAGNOSIS — K219 Gastro-esophageal reflux disease without esophagitis: Secondary | ICD-10-CM | POA: Diagnosis not present

## 2019-06-02 DIAGNOSIS — G8929 Other chronic pain: Secondary | ICD-10-CM

## 2019-06-02 DIAGNOSIS — F329 Major depressive disorder, single episode, unspecified: Secondary | ICD-10-CM

## 2019-06-02 DIAGNOSIS — R4189 Other symptoms and signs involving cognitive functions and awareness: Secondary | ICD-10-CM

## 2019-06-02 DIAGNOSIS — I10 Essential (primary) hypertension: Secondary | ICD-10-CM | POA: Diagnosis not present

## 2019-06-02 DIAGNOSIS — I4819 Other persistent atrial fibrillation: Secondary | ICD-10-CM

## 2019-06-02 DIAGNOSIS — F0393 Unspecified dementia, unspecified severity, with mood disturbance: Secondary | ICD-10-CM

## 2019-06-02 DIAGNOSIS — E222 Syndrome of inappropriate secretion of antidiuretic hormone: Secondary | ICD-10-CM

## 2019-06-02 DIAGNOSIS — R600 Localized edema: Secondary | ICD-10-CM

## 2019-06-02 DIAGNOSIS — F0280 Dementia in other diseases classified elsewhere without behavioral disturbance: Secondary | ICD-10-CM

## 2019-06-02 DIAGNOSIS — M545 Low back pain: Secondary | ICD-10-CM

## 2019-06-02 LAB — CHLORIDE
B Natriuretic Peptide: 95
Calcium: 9
Carbon Dioxide, Total: 28
Chloride: 102
EGFR (Non-African Amer.): 42

## 2019-06-02 NOTE — Progress Notes (Signed)
Location:  [facility] Room Number: 32 Place of Service:    Provider:  Marlana Latus  NP  [name], MD  Patient Care Team: [name], MD as PCP - General (Internal Medicine) [name], MD as PCP - Cardiology (Cardiology) [name], MD as Consulting Physician (Internal Medicine) Ngetich, Nelda Bucks, NP as Nurse Practitioner (Family Medicine)  Extended Emergency Contact Information Primary Emergency Contact: Knoedler,Bob Address: [address]           [place], [place] Phone: [phone] Relation: Son Secondary Emergency Contact: Philmore [place] Phone: [phone] Mobile Phone: [phone] Relation: Daughter  Code Status:  DNR Goals of care: Advanced Directive information Advanced Directives 06/02/2019  Does Patient Have a Medical Advance Directive? Yes  Type of Advance Directive Living will;Out of facility DNR (pink MOST or yellow form)  Does patient want to make changes to medical advance directive? No - Patient declined  Copy of [place] in Chart? -  Would patient like information on creating a medical advance directive? -  Pre-existing out of facility DNR order (yellow form or pink MOST form) Yellow form placed in chart (order not valid for inpatient use)     Chief Complaint  Patient presents with  . Medical Management of Chronic Issues    HPI:  Pt is a 85 y.o. female seen today for medical management of chronic diseases.     The patient resides in SNF [facility] for safety and care assistance, w/c for mobility. Hx of chronic peripheral edema, stable, on Torsemide 20mg  qd.  Her mood is stable, on Quetiapine 12.5mg  qd, Mirtazapine 7.5mg  qd, Wellbutrin 150mg  qd. Her memory is preserved on Memantine 5mg  qd. Anemia, baseline Hgb 9s, on Fe daily. GERD, stable, on Famotidine 20mg  qd. Hyponatremia, stable, on Demeclocycline 150mg  bid. HTN, blood pressure is  controlled, on Carvedilol 12.5mg  bid, Amlodipine 5mg  qd. . AFib, heart rate is in control. OA pain, multiple sites, mostly lower back,  stable, on Tylenol 500mg  qd, w/c for mobility.   Past Medical History:  Diagnosis Date  . Amputation of finger of right hand   . Aortic stenosis, mild 03/03/2016   By echo 08/2015  . Arthritis   . Back pain   . Bradycardia 08/28/2015  . CKD (chronic kidney disease) stage 3, GFR 30-59 ml/min (HCC)   . Closed right hip fracture ([place]) 12/2018  . Depression   . GERD (gastroesophageal reflux disease)   . GI bleed due to NSAIDs   . Hyperlipidemia   . Hypertension   . Macular degeneration   . Maxillary sinusitis   . MVP (mitral valve prolapse) 03/03/2016   Bileaflet MVP with mild MR by echo 2016  . Old MI (myocardial infarction)    NSTEMI secondary to stress MI/Takotsubo CM, minimal nonobstructive ASCAD at cath  . Osteoporosis   . Persistent atrial fibrillation 2006   not on anticoagulation due to GI bleed and increased fall risk  . PMR (polymyalgia rheumatica) (HCC)   . SIADH (syndrome of inappropriate ADH production) ([place])   . Takotsubo cardiomyopathy    EF normalized to 70%  . Ventricular tachycardia ([place])    on initial presentation of MI   Past Surgical History:  Procedure Laterality Date  . CARDIAC CATHETERIZATION     normal coronary arteries  . CHOLECYSTECTOMY  11/10/2011   Procedure: LAPAROSCOPIC CHOLECYSTECTOMY WITH INTRAOPERATIVE CHOLANGIOGRAM;  Surgeon: Pedro Earls, MD;  Location: [facility];  Service: General;  Laterality: N/A;  . EYE SURGERY  06/10/11   membrane peel   . INTRAMEDULLARY (IM) NAIL INTERTROCHANTERIC Right 12/21/2018   Procedure: INTRAMEDULLARY (IM) NAIL INTERTROCHANTRIC;  Surgeon: Nicholes Stairs, MD;  Location: [facility];  Service: Orthopedics;  Laterality: Right;  . NASAL SINUS SURGERY Left   . ROTATOR CUFF REPAIR  1998  . UMBILICAL HERNIA REPAIR  11/10/2011   Procedure: HERNIA REPAIR UMBILICAL ADULT;  Surgeon: Pedro Earls, MD;  Location: [facility];  Service: General;  Laterality: N/A;  . WISDOM TOOTH EXTRACTION      Allergies  Allergen Reactions  . Amlodipine Swelling    To feet and ankles   . Avelox [Moxifloxacin Hcl In Nacl] Other (See Comments)    Pt does not remember   . Biaxin [Clarithromycin]   . Ceftin [Cefuroxime Axetil]   . Duragesic Disc Transdermal System [Fentanyl] Other (See Comments)    Reaction unknown  . Morphine And Related Other (See Comments)    "My Sister and daughter can,t take it. I've had it before without problems."  . Parathyroid Hormone (Recomb) Other (See Comments)    Made skin dry  . Teriparatide   . Lidocaine Rash    Outpatient Encounter Medications as of 06/02/2019  Medication Sig  . acetaminophen (TYLENOL) 500 MG tablet Take 500 mg by mouth every 6 (six) hours as needed for mild pain, fever or headache.  Marland Kitchen acetaminophen (TYLENOL) 500 MG tablet Take 500 mg by mouth daily.  Marland Kitchen albuterol (PROVENTIL) (2.5 MG/3ML) 0.083% nebulizer solution Take 2.5 mg by nebulization every 6 (six) hours as needed for wheezing or shortness of breath.  . Amino Acids-Protein Hydrolys (FEEDING SUPPLEMENT, PRO-STAT SUGAR FREE 64,) LIQD Take 30 mLs by mouth daily.  Marland Kitchen amLODipine (NORVASC) 5 MG tablet TAKE 1 TABLET BY MOUTH EVERY DAY  . azelastine (ASTELIN) 0.1 % nasal spray Place 2 sprays into both nostrils 2 (two) times daily as needed for allergies.   Marland Kitchen buPROPion (WELLBUTRIN SR) 150 MG 12 hr tablet Take 1 tablet (150 mg total) by mouth every morning.  . carboxymethylcellul-glycerin (LUBRICATING EYE DROPS) 0.5-0.9 % ophthalmic solution Place 1 drop into both eyes 3 (three) times daily as needed for dry eyes. For allergies  . carvedilol (COREG) 12.5 MG tablet Take 1 tablet (12.5 mg total) by mouth 2 (two) times daily with a meal.  . cetirizine (ZYRTEC) 10 MG tablet Take 10 mg by mouth daily.   Marland Kitchen demeclocycline (DECLOMYCIN) 150 MG tablet Take 150 mg by mouth 2 (two) times daily.   . diclofenac  sodium (VOLTAREN) 1 % GEL Apply 1 application topically 4 (four) times daily as needed (pain).   . famotidine (PEPCID) 20 MG tablet Take 20 mg by mouth daily.  . Ferrous Sulfate (IRON) 325 (65 Fe) MG TABS Take 1 tablet by mouth every other day. Give with meals  . lactose free nutrition (BOOST) LIQD Take 237 mLs by mouth 2 (two) times daily between meals.  . memantine (NAMENDA) 5 MG tablet Take 5 mg by mouth daily.   . mirtazapine (REMERON) 7.5 MG tablet Take 7.5 mg by mouth at bedtime.  . ondansetron (ZOFRAN) 4 MG tablet Take 1 tablet (4 mg total) by mouth every 6 (six) hours as needed for nausea.  . OXYGEN Inhale 2 L into the lungs 3 (three) times daily as needed.  . polyethylene glycol (MIRALAX / GLYCOLAX) packet Take 17 g by mouth daily as needed for mild constipation.   Marland Kitchen  QUEtiapine (SEROQUEL) 25 MG tablet Take 12.5 mg by mouth daily.  Marland Kitchen torsemide (DEMADEX) 20 MG tablet Take 20 mg by mouth daily.  Marland Kitchen zinc oxide 20 % ointment Apply 1 application topically as needed for irritation. Apply to peri area as needed  . [DISCONTINUED] Multiple Vitamins-Minerals (CENTRUM CARDIO) TABS Take by mouth 2 (two) times daily.   . [DISCONTINUED] nystatin cream (MYCOSTATIN) Apply 1 application topically 2 (two) times daily.   No facility-administered encounter medications on file as of 06/02/2019.    ROS was provided with assistance of staff Review of Systems  Constitutional: Positive for unexpected weight change. Negative for activity change, appetite change, chills, diaphoresis, fatigue and fever.  HENT: Positive for hearing loss. Negative for congestion and voice change.   Respiratory: Positive for shortness of breath. Negative for cough and wheezing.        Chronic DOE  Cardiovascular: Positive for leg swelling. Negative for chest pain and palpitations.  Gastrointestinal: Negative for abdominal distention, abdominal pain, constipation, diarrhea, nausea and vomiting.  Genitourinary: Negative for difficulty  urinating, dysuria and urgency.  Musculoskeletal: Positive for arthralgias and gait problem.  Skin: Negative for color change and pallor.  Neurological: Negative for dizziness, speech difficulty, weakness and headaches.       Memory lapses.  Psychiatric/Behavioral: Negative for agitation, behavioral problems, hallucinations and sleep disturbance. The patient is not nervous/anxious.     Immunization History  Administered Date(s) Administered  . Influenza, High Dose Seasonal PF 07/12/2017   Pertinent  Health Maintenance Due  Topic Date Due  . DEXA SCAN  02/20/1990  . PNA vac Low Risk Adult (1 of 2 - PCV13) 02/20/1990  . INFLUENZA VACCINE  05/20/2019   No flowsheet data found. Functional Status Survey:    Vitals:   06/02/19 0946  BP: 132/60  Pulse: 74  Resp: 20  Temp: (!) 97.2 F (36.2 C)  SpO2: 91%  Weight: 116 lb 14.4 oz (53 kg)  Height: 5' (1.524 m)   Body mass index is 22.83 kg/m. Physical Exam Constitutional:      General: She is not in acute distress.    Appearance: Normal appearance. She is normal weight. She is not ill-appearing or toxic-appearing.  HENT:     Head: Normocephalic and atraumatic.     Nose: Nose normal.     Mouth/Throat:     Mouth: Mucous membranes are moist.  Eyes:     Extraocular Movements: Extraocular movements intact.     Conjunctiva/sclera: Conjunctivae normal.     Pupils: Pupils are equal, round, and reactive to light.  Neck:     Musculoskeletal: Normal range of motion and neck supple.  Cardiovascular:     Rate and Rhythm: Normal rate. Rhythm irregular.     Heart sounds: Murmur present.  Pulmonary:     Effort: Pulmonary effort is normal.     Breath sounds: Normal breath sounds. No wheezing, rhonchi or rales.  Chest:     Chest wall: No tenderness.  Abdominal:     Palpations: Abdomen is soft.     Tenderness: There is no right CVA tenderness, left CVA tenderness, guarding or rebound.     Hernia: A hernia is present.     Comments:  Umbilical hernia  Musculoskeletal:     Right lower leg: Edema present.     Left lower leg: Edema present.     Comments: Trace edema BLE. W/c for mobility  Skin:    General: Skin is warm and dry.  Neurological:  General: No focal deficit present.     Mental Status: She is alert. Mental status is at baseline.     Cranial Nerves: No cranial nerve deficit.     Motor: No weakness.     Coordination: Coordination normal.     Gait: Gait abnormal.     Comments: Oriented to person and place  Psychiatric:        Mood and Affect: Mood normal.        Behavior: Behavior normal.        Thought Content: Thought content normal.     Labs reviewed: Recent Labs    12/21/18 0019 12/22/18 0304 12/23/18 0207  01/30/19 03/30/19 04/25/19 04/26/19  NA 136 136 132*   < > 137 136* 137  --   K 5.0 4.1 4.2   < > 4.7 4.4 4.2  --   CL 106 103 102  --   --   --   --  102  CO2 23 25 25   --   --   --   --  28  GLUCOSE 140* 137* 110*  --   --   --   --   --   BUN 35* 14 20   < > 25* 25* 48*  --   CREATININE 1.21* 0.91 1.37*   < > 1.2* 1.1 1.1  --   CALCIUM 8.9 8.3* 8.4*  --   --   --   --  9.0  MG  --  1.8  --   --   --   --   --   --    < > = values in this interval not displayed.   Recent Labs    12/22/18 0304 12/23/18 0207 03/30/19  AST 86* 53* 20  ALT 64* 37 11  ALKPHOS 82 69 92  BILITOT 0.6 0.7  --   PROT 5.6* 5.4*  --   ALBUMIN 2.6* 2.3*  --    Recent Labs    12/21/18 0019 12/22/18 0304 12/23/18 0207 01/30/19 02/16/19 02/20/19 03/30/19  WBC 10.4 10.4 10.9* 7.6 10.8  --  7.9  NEUTROABS 7.5 9.4* 7.3  --   --   --   --   HGB 11.3* 9.3* 8.0* 9.5* 10.2*  10.2* 9.8* 9.8*  HCT 35.8* 28.7* 25.4* 28* 31*  31* 29* 30*  MCV 93.2 93.8 93.7  --   --   --   --   PLT 319 234 189 431* 387  387 419* 532*   Lab Results  Component Value Date   TSH 2.600 09/19/2018   No results found for: HGBA1C No results found for: CHOL, HDL, LDLCALC, LDLDIRECT, TRIG, CHOLHDL  Significant Diagnostic  Results in last 30 days:  No results found.  Assessment/Plan Essential hypertension, benign Blood pressure is controlled, continue Carvedilol 12.5mg  bid, Amlodipine 5mg  qd.   Persistent atrial fibrillation (HCC) Heart rate is in control, not on anticoagulation due to Hx of GI bleed.   Esophageal reflux Stable, continue Famotidine 20mg  qd  SIADH (syndrome of inappropriate ADH production) (HCC) Stable, continue Demeclocycline 150mg  bid. Last serum Na 137 04/25/19  Edema of extremities Trace edema BLE remains no change, continue Torsemide 20mg  qd.   Hallucination, visual Stable, continue Quetiapine 12.5mg  qd  Cognitive impairment Continue SNF [facility] for safety and care assistance, continue Memantine.   Back pain Stable, continue Tylenol 500mg  qd, w/c to get around.  Depression due to dementia [facility]) Her mood is stable, continue  Quetiapine 12.5mg  qd,  Mirtazapine 7.5mg  qd, Wellbutrin 150mg  qd.  Anemia Hgb 9.6 03/23/19, no active bleed, continue Fe daily.      Family/ staff Communication: plan of care reviewed with the patient and charge nurse.   Labs/tests ordered:  none  Time spend 25 minutes.

## 2019-06-06 ENCOUNTER — Encounter: Payer: Self-pay | Admitting: Nurse Practitioner

## 2019-06-06 DIAGNOSIS — F0280 Dementia in other diseases classified elsewhere without behavioral disturbance: Secondary | ICD-10-CM | POA: Insufficient documentation

## 2019-06-06 DIAGNOSIS — F0393 Unspecified dementia, unspecified severity, with mood disturbance: Secondary | ICD-10-CM | POA: Insufficient documentation

## 2019-06-06 NOTE — Assessment & Plan Note (Signed)
Stable, continue Famotidine 20mg qd.  

## 2019-06-06 NOTE — Assessment & Plan Note (Signed)
Stable, continue Quetiapine 12.5mg  qd

## 2019-06-06 NOTE — Assessment & Plan Note (Signed)
Hgb 9.6 03/23/19, no active bleed, continue Fe daily.

## 2019-06-06 NOTE — Assessment & Plan Note (Signed)
Heart rate is in control, not on anticoagulation due to Hx of GI bleed.

## 2019-06-06 NOTE — Assessment & Plan Note (Signed)
Her mood is stable, continue  Quetiapine 12.5mg  qd, Mirtazapine 7.5mg  qd, Wellbutrin 150mg  qd.

## 2019-06-06 NOTE — Assessment & Plan Note (Signed)
Trace edema BLE remains no change, continue Torsemide 20mg  qd.

## 2019-06-06 NOTE — Assessment & Plan Note (Signed)
Continue SNF [facility] for safety and care assistance, continue Memantine.  

## 2019-06-06 NOTE — Assessment & Plan Note (Signed)
Stable, continue Demeclocycline 150mg  bid. Last serum Na 137 04/25/19

## 2019-06-06 NOTE — Assessment & Plan Note (Signed)
Stable, continue Tylenol 500mg  qd, w/c to get around.

## 2019-06-06 NOTE — Assessment & Plan Note (Signed)
Blood pressure is controlled, continue Carvedilol 12.5mg  bid, Amlodipine 5mg  qd.

## 2019-06-19 ENCOUNTER — Encounter: Payer: Self-pay | Admitting: Internal Medicine

## 2019-06-19 ENCOUNTER — Other Ambulatory Visit: Payer: Self-pay | Admitting: *Deleted

## 2019-06-19 ENCOUNTER — Non-Acute Institutional Stay (SKILLED_NURSING_FACILITY): Payer: Medicare Other | Admitting: Internal Medicine

## 2019-06-19 DIAGNOSIS — F329 Major depressive disorder, single episode, unspecified: Secondary | ICD-10-CM

## 2019-06-19 DIAGNOSIS — F0280 Dementia in other diseases classified elsewhere without behavioral disturbance: Secondary | ICD-10-CM

## 2019-06-19 DIAGNOSIS — F0393 Unspecified dementia, unspecified severity, with mood disturbance: Secondary | ICD-10-CM

## 2019-06-19 DIAGNOSIS — I4819 Other persistent atrial fibrillation: Secondary | ICD-10-CM | POA: Diagnosis not present

## 2019-06-19 DIAGNOSIS — I10 Essential (primary) hypertension: Secondary | ICD-10-CM | POA: Diagnosis not present

## 2019-06-19 DIAGNOSIS — E222 Syndrome of inappropriate secretion of antidiuretic hormone: Secondary | ICD-10-CM | POA: Diagnosis not present

## 2019-06-19 LAB — BRAIN NATRIURETIC PEPTIDE: B Natriuretic Peptide: 95

## 2019-06-19 NOTE — Progress Notes (Signed)
Location:  [facility] Room Number: 32 Place of Service:  SNF [id number]) Provider:  Veleta Miners  MD  [name], MD  Patient Care Team: [name], MD as PCP - General (Internal Medicine) [name], MD as PCP - Cardiology (Cardiology) [name], MD as Consulting Physician (Internal Medicine) Ngetich, Nelda Bucks, NP as Nurse Practitioner (Family Medicine)  Extended Emergency Contact Information Primary Emergency Contact: See,Bob Address: [address]           [place], [place] Phone: [phone] Relation: Son Secondary Emergency Contact: Philmore [place] Phone: [phone] Mobile Phone: [phone] Relation: Daughter  Code Status:  DNR Goals of care: Advanced Directive information Advanced Directives 06/02/2019  Does Patient Have a Medical Advance Directive? Yes  Type of Advance Directive Living will;Out of facility DNR (pink MOST or yellow form)  Does patient want to make changes to medical advance directive? No - Patient declined  Copy of [place] in Chart? -  Would patient like information on creating a medical advance directive? -  Pre-existing out of facility DNR order (yellow form or pink MOST form) Yellow form placed in chart (order not valid for inpatient use)     Chief Complaint  Patient presents with  . Medical Management of Chronic Issues    HPI:  Pt is a 85 y.o. female seen today for medical management of chronic diseases.   She has a history of Takotsubo's cardiomyopathy with EF of 55 to 123456, diastolic dysfunction, hypertension, PAF not on any anticoagulation, SIADH, depression She has been in SNF since 3/04 after her Right hip fracture.She underwent InterMedullary Implant on 03/04.  Patient has been stable in the facility.  Per nurses has not had any more hallucinations.  She did have cough today and was wheezing.  But denied any  shortness of breath. The past few weeks patient has slowly started to gain her weight.  Since she has been on Remeron. Her main complaint continues to be pain in her right leg.  She is walking with a walker but with mod assist with therapy Her appetite seems to have improved No new nursing issues Patient denied any chest pain or fever no abdominal pain or nausea  Past Medical History:  Diagnosis Date  . Amputation of finger of right hand   . Aortic stenosis, mild 03/03/2016   By echo 08/2015  . Arthritis   . Back pain   . Bradycardia 08/28/2015  . CKD (chronic kidney disease) stage 3, GFR 30-59 ml/min (HCC)   . Closed right hip fracture ([facility]) 12/2018  . Depression   . GERD (gastroesophageal reflux disease)   . GI bleed due to NSAIDs   . Hyperlipidemia   . Hypertension   . Macular degeneration   . Maxillary sinusitis   . MVP (mitral valve prolapse) 03/03/2016   Bileaflet MVP with mild MR by echo 2016  . Old MI (myocardial infarction)    NSTEMI secondary to stress MI/Takotsubo CM, minimal nonobstructive ASCAD at cath  . Osteoporosis   . Persistent atrial fibrillation 2006   not on anticoagulation due to GI bleed and increased fall risk  . PMR (polymyalgia rheumatica) (HCC)   . SIADH (syndrome of inappropriate ADH production) ([facility])   . Takotsubo cardiomyopathy    EF normalized to 70%  . Ventricular tachycardia ([facility])    on initial presentation of MI   Past Surgical History:  Procedure Laterality Date  . CARDIAC CATHETERIZATION     normal coronary arteries  . CHOLECYSTECTOMY  11/10/2011   Procedure: LAPAROSCOPIC CHOLECYSTECTOMY WITH INTRAOPERATIVE CHOLANGIOGRAM;  Surgeon: Pedro Earls, MD;  Location: [facility];  Service: General;  Laterality: N/A;  . EYE SURGERY  06/10/11   membrane peel   . INTRAMEDULLARY (IM) NAIL INTERTROCHANTERIC Right 12/21/2018   Procedure: INTRAMEDULLARY (IM) NAIL INTERTROCHANTRIC;  Surgeon: Nicholes Stairs, MD;  Location: [facility];  Service:  Orthopedics;  Laterality: Right;  . NASAL SINUS SURGERY Left   . ROTATOR CUFF REPAIR  1998  . UMBILICAL HERNIA REPAIR  11/10/2011   Procedure: HERNIA REPAIR UMBILICAL ADULT;  Surgeon: Pedro Earls, MD;  Location: [facility];  Service: General;  Laterality: N/A;  . WISDOM TOOTH EXTRACTION      Allergies  Allergen Reactions  . Amlodipine Swelling    To feet and ankles   . Avelox [Moxifloxacin Hcl In Nacl] Other (See Comments)    Pt does not remember   . Biaxin [Clarithromycin]   . Ceftin [Cefuroxime Axetil]   . Duragesic Disc Transdermal System [Fentanyl] Other (See Comments)    Reaction unknown  . Morphine And Related Other (See Comments)    "My Sister and daughter can,t take it. I've had it before without problems."  . Parathyroid Hormone (Recomb) Other (See Comments)    Made skin dry  . Teriparatide   . Lidocaine Rash    Outpatient Encounter Medications as of 06/19/2019  Medication Sig  . acetaminophen (TYLENOL) 500 MG tablet Take 500 mg by mouth every 6 (six) hours as needed for mild pain, fever or headache.  Marland Kitchen acetaminophen (TYLENOL) 500 MG tablet Take 500 mg by mouth daily.  Marland Kitchen albuterol (PROVENTIL) (2.5 MG/3ML) 0.083% nebulizer solution Take 2.5 mg by nebulization every 6 (six) hours as needed for wheezing or shortness of breath.  . Amino Acids-Protein Hydrolys (FEEDING SUPPLEMENT, PRO-STAT SUGAR FREE 64,) LIQD Take 30 mLs by mouth daily.  Marland Kitchen amLODipine (NORVASC) 5 MG tablet TAKE 1 TABLET BY MOUTH EVERY DAY  . azelastine (ASTELIN) 0.1 % nasal spray Place 2 sprays into both nostrils 2 (two) times daily as needed for allergies.   Marland Kitchen buPROPion (WELLBUTRIN SR) 150 MG 12 hr tablet Take 1 tablet (150 mg total) by mouth every morning.  . carboxymethylcellul-glycerin (LUBRICATING EYE DROPS) 0.5-0.9 % ophthalmic solution Place 1 drop into both eyes 3 (three) times daily as needed for dry eyes. For allergies  . carvedilol (COREG) 12.5 MG tablet Take 1 tablet (12.5 mg total) by mouth 2  (two) times daily with a meal.  . cetirizine (ZYRTEC) 10 MG tablet Take 10 mg by mouth daily.   Marland Kitchen demeclocycline (DECLOMYCIN) 150 MG tablet Take 150 mg by mouth 2 (two) times daily.   . diclofenac sodium (VOLTAREN) 1 % GEL Apply 1 application topically 4 (four) times daily as needed (pain).   . famotidine (PEPCID) 20 MG tablet Take 20 mg by mouth daily.  . Ferrous Sulfate (IRON) 325 (65 Fe) MG TABS Take 1 tablet by mouth every other day. Give with meals  . lactose free nutrition (BOOST) LIQD Take 237 mLs by mouth 2 (two) times daily between meals.  . memantine (NAMENDA) 5 MG tablet Take 5 mg by mouth daily.   . mirtazapine (REMERON) 7.5 MG tablet Take 7.5 mg by mouth at bedtime.  . ondansetron (ZOFRAN) 4 MG tablet Take 1 tablet (4 mg total) by mouth every 6 (six) hours as needed for nausea.  Marland Kitchen  polyethylene glycol (MIRALAX / GLYCOLAX) packet Take 17 g by mouth daily as needed for mild constipation.   . QUEtiapine (SEROQUEL) 25 MG tablet Take 12.5 mg by mouth daily.  Marland Kitchen torsemide (DEMADEX) 20 MG tablet Take 20 mg by mouth daily.  Marland Kitchen zinc oxide 20 % ointment Apply 1 application topically as needed for irritation. Apply to peri area as needed  . [DISCONTINUED] OXYGEN Inhale 2 L into the lungs 3 (three) times daily as needed.   No facility-administered encounter medications on file as of 06/19/2019.     Review of Systems  Constitutional: Negative.   HENT: Positive for congestion.   Respiratory: Positive for cough.   Cardiovascular: Positive for leg swelling.  Gastrointestinal: Negative.   Genitourinary: Negative.   Musculoskeletal: Positive for arthralgias and myalgias.  Skin: Negative.   Neurological: Positive for weakness.  Psychiatric/Behavioral: Positive for confusion.    Immunization History  Administered Date(s) Administered  . Influenza, High Dose Seasonal PF 07/12/2017   Pertinent  Health Maintenance Due  Topic Date Due  . DEXA SCAN  02/20/1990  . PNA vac Low Risk Adult (1 of 2  - PCV13) 02/20/1990  . INFLUENZA VACCINE  05/20/2019   No flowsheet data found. Functional Status Survey:    Vitals:   06/19/19 1050  BP: 140/60  Pulse: 68  Resp: 18  Temp: 98.2 F (36.8 C)  SpO2: 94%  Weight: 121 lb 3.2 oz (55 kg)  Height: 5' (1.524 m)   Body mass index is 23.67 kg/m. Physical Exam Vitals signs reviewed.  HENT:     Head: Normocephalic.     Nose: Nose normal.     Mouth/Throat:     Mouth: Mucous membranes are moist.     Pharynx: Oropharynx is clear.  Eyes:     Pupils: Pupils are equal, round, and reactive to light.  Neck:     Musculoskeletal: Neck supple.  Cardiovascular:     Rate and Rhythm: Normal rate and regular rhythm.     Pulses: Normal pulses.     Heart sounds: Murmur present.  Pulmonary:     Effort: Pulmonary effort is normal.     Comments: Had Expiratory wheezing Abdominal:     General: Abdomen is flat. Bowel sounds are normal.     Palpations: Abdomen is soft.  Musculoskeletal:     Comments: Moderate edema Bilateral  Skin:    General: Skin is warm and dry.  Neurological:     General: No focal deficit present.     Mental Status: She is alert.  Psychiatric:        Mood and Affect: Mood normal.        Thought Content: Thought content normal.     Labs reviewed: Recent Labs    12/21/18 0019 12/22/18 0304 12/23/18 0207  01/30/19 03/30/19 04/25/19 04/26/19  NA 136 136 132*   < > 137 136* 137  --   K 5.0 4.1 4.2   < > 4.7 4.4 4.2  --   CL 106 103 102  --   --   --   --  102  CO2 23 25 25   --   --   --   --  28  GLUCOSE 140* 137* 110*  --   --   --   --   --   BUN 35* 14 20   < > 25* 25* 48*  --   CREATININE 1.21* 0.91 1.37*   < > 1.2* 1.1 1.1  --  CALCIUM 8.9 8.3* 8.4*  --   --   --   --  9.0  MG  --  1.8  --   --   --   --   --   --    < > = values in this interval not displayed.   Recent Labs    12/22/18 0304 12/23/18 0207 03/30/19  AST 86* 53* 20  ALT 64* 37 11  ALKPHOS 82 69 92  BILITOT 0.6 0.7  --   PROT 5.6* 5.4*   --   ALBUMIN 2.6* 2.3*  --    Recent Labs    12/21/18 0019 12/22/18 0304 12/23/18 0207 01/30/19 02/16/19 02/20/19 03/30/19  WBC 10.4 10.4 10.9* 7.6 10.8  --  7.9  NEUTROABS 7.5 9.4* 7.3  --   --   --   --   HGB 11.3* 9.3* 8.0* 9.5* 10.2*  10.2* 9.8* 9.8*  HCT 35.8* 28.7* 25.4* 28* 31*  31* 29* 30*  MCV 93.2 93.8 93.7  --   --   --   --   PLT 319 234 189 431* 387  387 419* 532*   Lab Results  Component Value Date   TSH 2.600 09/19/2018   No results found for: HGBA1C No results found for: CHOL, HDL, LDLCALC, LDLDIRECT, TRIG, CHOLHDL  Significant Diagnostic Results in last 30 days:  No results found.  Assessment/Plan  Takotsubo cardiomyopathy - Plan:  Continue on Demadex Weight is up but most likely due to her improved appetite. BNP in past has been Normal Left Pleural Effusion On Chest Xray Repeat Chest Xray in 4 weeks If the effusion persists will discuss with family about doing a CT scan Persistent atrial fibrillation ([place]) - Plan:  Not on any Anticoagulation due to GI bleed On Coreg for rate Control  Essential hypertension, benign - Plan:  BP stable On Low dose of Norvasc Chronic bronchitis Will try Symbicort BID to see if it helps her Wheezing SIADH (syndrome of inappropriate ADH production) ([place]) - Plan:  Sodium is stable on Demeclocycline Repeat BMP  Delirium with Hallucinations  Has been doing well in Namenda and Seroquel Low dose Was taken off her Ultram Will make her Seroquel to PRN and eventual taper off  Weight loss - Plan:   on Remeron 7.5 mg QHS and doing well Continue same dose   Right Hip Arthroplasty Pain seem to be controlled on Tylenol Still dependent for her ADLS and transfer Will work with therapy again Cognitive Impairment Speech to Eval as her Son wants to eventually take care of her Trust and wants to make sure she continues to stay in SNF for higher Level of Care Will increase her Namenda to 5 mg BID Depression On  Wellbutrin and Remeron She is stable Anemia On iron Repeat CBC  Family/ staff Communication:   Labs/tests ordered:  BMP and CBC Chest Xray in 4 weeks  Total time spent in this patient care encounter was  _45  minutes; greater than 50% of the visit spent counseling patient and staff, reviewing records , Labs and coordinating care for problems addressed at this encounter.

## 2019-06-23 ENCOUNTER — Non-Acute Institutional Stay (SKILLED_NURSING_FACILITY): Payer: Medicare Other | Admitting: Nurse Practitioner

## 2019-06-23 ENCOUNTER — Encounter: Payer: Self-pay | Admitting: Nurse Practitioner

## 2019-06-23 DIAGNOSIS — N184 Chronic kidney disease, stage 4 (severe): Secondary | ICD-10-CM

## 2019-06-23 DIAGNOSIS — F0280 Dementia in other diseases classified elsewhere without behavioral disturbance: Secondary | ICD-10-CM

## 2019-06-23 DIAGNOSIS — F0393 Unspecified dementia, unspecified severity, with mood disturbance: Secondary | ICD-10-CM

## 2019-06-23 DIAGNOSIS — I5189 Other ill-defined heart diseases: Secondary | ICD-10-CM

## 2019-06-23 DIAGNOSIS — F329 Major depressive disorder, single episode, unspecified: Secondary | ICD-10-CM | POA: Diagnosis not present

## 2019-06-23 DIAGNOSIS — R635 Abnormal weight gain: Secondary | ICD-10-CM | POA: Diagnosis not present

## 2019-06-23 NOTE — Assessment & Plan Note (Signed)
Weight gained #8 Ibs in the past 5-6 weeks, #113 Ibs 05/19/19, #121Ibs 06/23/19, her baseline wt #119Ibs 05/2019. Desirable. Wt 3x/wk to monitor for s/s of fluid retention.

## 2019-06-23 NOTE — Assessment & Plan Note (Addendum)
04/26/19 Bun/creat 48/1.17 06/22/19 Na 139, K 4.8, Bun 50, creat 1.54, wbc 6.2, Hgb 10.6, plt 277 06/23/19 hold Furosemide x 3 days, then start Furosemide 20mg  qd, 4days/wk,  BMP one week. Wt 3x/wk.

## 2019-06-23 NOTE — Assessment & Plan Note (Addendum)
09/2018 echocardiogram EF 0000000, grade I diastolic dysfunction. Monitor for s/s of fluid overload since Torsemide is on hold x 3 days, then reduce to 4x/wk

## 2019-06-23 NOTE — Assessment & Plan Note (Signed)
continue Mirtazapine 7.5mg  qhs for weight/mood, continue Wellbutrin 150mg  qd, Quetiapine 12.5mg  prn. Continue Memantine 5mg  bid for memory.

## 2019-06-23 NOTE — Progress Notes (Signed)
Location:   [facility] [place] Room Number: 53 Place of Service:  SNF (31) Provider:  Marlana Latus  NP  [name], MD  Patient Care Team: [name], MD as PCP - General (Internal Medicine) [name], MD as PCP - Cardiology (Cardiology) [name], MD as Consulting Physician (Internal Medicine) Ngetich, Nelda Bucks, NP as Nurse Practitioner (Family Medicine)  Extended Emergency Contact Information Primary Emergency Contact: Villeda,Bob Address: [address]           [place], [place] Phone: [phone] Relation: Son Secondary Emergency Contact: Philmore [place] Phone: [phone] Mobile Phone: [phone] Relation: Daughter  Code Status:  DNR Goals of care: Advanced Directive information Advanced Directives 06/23/2019  Does Patient Have a Medical Advance Directive? Yes  Type of Paramedic of [place];Living will;Out of facility DNR (pink MOST or yellow form)  Does patient want to make changes to medical advance directive? No - Patient declined  Copy of [place] in Chart? Yes - validated most recent copy scanned in chart (See row information)  Would patient like information on creating a medical advance directive? -  Pre-existing out of facility DNR order (yellow form or pink MOST form) Yellow form placed in chart (order not valid for inpatient use);Pink MOST form placed in chart (order not valid for inpatient use)     Chief Complaint  Patient presents with  . Acute Visit    C/o - worsened chronic kidney disease.    HPI:  Pt is a 85 y.o. female seen today for an acute visit for elevated Bun/creat 50/1.54 06/22/19, on Torsemide 20mg  qd for edema/diastolic function. She was noted to DOE, wheezes sometimes, only scattered crackles and left ankle trace edema appreciated today. EF 55-60%. Weight gained #8 Ibs in the past 5-6 weeks, #113 Ibs 05/19/19,  #121Ibs 06/23/19, her baseline wt #119Ibs 05/2019. She takes Mirtazapine 7.5mg  qhs for weight/mood, her mood is stable on Wellbutrin 150mg  qd, Quetiapine 12.5mg  prn.  HPI was provided with assistance of staff, on Memantien 5mg  bid for memory .   Past Medical History:  Diagnosis Date  . Amputation of finger of right hand   . Aortic stenosis, mild 03/03/2016   By echo 08/2015  . Arthritis   . Back pain   . Bradycardia 08/28/2015  . CKD (chronic kidney disease) stage 3, GFR 30-59 ml/min (HCC)   . Closed right hip fracture ([place]) 12/2018  . Depression   . GERD (gastroesophageal reflux disease)   . GI bleed due to NSAIDs   . Hyperlipidemia   . Hypertension   . Macular degeneration   . Maxillary sinusitis   . MVP (mitral valve prolapse) 03/03/2016   Bileaflet MVP with mild MR by echo 2016  . Old MI (myocardial infarction)    NSTEMI secondary to stress MI/Takotsubo CM, minimal nonobstructive ASCAD at cath  . Osteoporosis   . Persistent atrial fibrillation 2006   not on anticoagulation due to GI bleed and increased fall risk  . PMR (polymyalgia rheumatica) (HCC)   . SIADH (syndrome of inappropriate ADH production) ([place])   . Takotsubo cardiomyopathy    EF normalized to 70%  . Ventricular tachycardia ([place])    on initial presentation of MI   Past Surgical History:  Procedure Laterality Date  . CARDIAC CATHETERIZATION     normal coronary arteries  . CHOLECYSTECTOMY  11/10/2011   Procedure: LAPAROSCOPIC CHOLECYSTECTOMY WITH INTRAOPERATIVE CHOLANGIOGRAM;  Surgeon: Pedro Earls, MD;  Location: [facility];  Service: General;  Laterality: N/A;  . EYE SURGERY  06/10/11   membrane peel   . INTRAMEDULLARY (IM) NAIL INTERTROCHANTERIC Right 12/21/2018   Procedure: INTRAMEDULLARY (IM) NAIL INTERTROCHANTRIC;  Surgeon: Nicholes Stairs, MD;  Location: [facility];  Service: Orthopedics;  Laterality: Right;  . NASAL SINUS SURGERY Left   . ROTATOR CUFF REPAIR  1998  . UMBILICAL HERNIA REPAIR  11/10/2011    Procedure: HERNIA REPAIR UMBILICAL ADULT;  Surgeon: Pedro Earls, MD;  Location: [facility];  Service: General;  Laterality: N/A;  . WISDOM TOOTH EXTRACTION      Allergies  Allergen Reactions  . Amlodipine Swelling    To feet and ankles   . Avelox [Moxifloxacin Hcl In Nacl] Other (See Comments)    Pt does not remember   . Biaxin [Clarithromycin]   . Ceftin [Cefuroxime Axetil]   . Duragesic Disc Transdermal System [Fentanyl] Other (See Comments)    Reaction unknown  . Morphine And Related Other (See Comments)    "My Sister and daughter can,t take it. I've had it before without problems."  . Parathyroid Hormone (Recomb) Other (See Comments)    Made skin dry  . Teriparatide   . Lidocaine Rash    Outpatient Encounter Medications as of 06/23/2019  Medication Sig  . acetaminophen (TYLENOL) 500 MG tablet Take 500 mg by mouth every 6 (six) hours as needed for mild pain, fever or headache.  Marland Kitchen acetaminophen (TYLENOL) 500 MG tablet Take 500 mg by mouth daily.  Marland Kitchen albuterol (PROVENTIL) (2.5 MG/3ML) 0.083% nebulizer solution Take 2.5 mg by nebulization every 6 (six) hours as needed for wheezing or shortness of breath.  . Amino Acids-Protein Hydrolys (FEEDING SUPPLEMENT, PRO-STAT SUGAR FREE 64,) LIQD Take 30 mLs by mouth daily.  Marland Kitchen amLODipine (NORVASC) 5 MG tablet TAKE 1 TABLET BY MOUTH EVERY DAY  . azelastine (ASTELIN) 0.1 % nasal spray Place 2 sprays into both nostrils 2 (two) times daily as needed for allergies.   . budesonide-formoterol (SYMBICORT) 80-4.5 MCG/ACT inhaler Inhale into the lungs 2 (two) times daily.  Marland Kitchen buPROPion (WELLBUTRIN SR) 150 MG 12 hr tablet Take 1 tablet (150 mg total) by mouth every morning.  . carboxymethylcellul-glycerin (LUBRICATING EYE DROPS) 0.5-0.9 % ophthalmic solution Place 1 drop into both eyes 3 (three) times daily as needed for dry eyes. For allergies  . carvedilol (COREG) 12.5 MG tablet Take 1 tablet (12.5 mg total) by mouth 2 (two) times daily with a meal.  .  cetirizine (ZYRTEC) 10 MG tablet Take 10 mg by mouth daily.   Marland Kitchen demeclocycline (DECLOMYCIN) 150 MG tablet Take 150 mg by mouth 2 (two) times daily.   . diclofenac sodium (VOLTAREN) 1 % GEL Apply 1 application topically 4 (four) times daily as needed (pain).   . famotidine (PEPCID) 20 MG tablet Take 20 mg by mouth daily.  . Ferrous Sulfate (IRON) 325 (65 Fe) MG TABS Take 1 tablet by mouth every other day. Give with meals  . lactose free nutrition (BOOST) LIQD Take 237 mLs by mouth. Twice a day with meals  . memantine (NAMENDA) 5 MG tablet Take 5 mg by mouth 2 (two) times daily.   . mirtazapine (REMERON) 7.5 MG tablet Take 7.5 mg by mouth at bedtime.  . ondansetron (ZOFRAN) 4 MG tablet Take 1 tablet (4 mg total) by mouth every 6 (six) hours as needed for nausea.  . polyethylene glycol (MIRALAX / GLYCOLAX) packet Take 17 g  by mouth daily.   . QUEtiapine (SEROQUEL) 25 MG tablet Take 12.5 mg by mouth daily as needed.   . torsemide (DEMADEX) 20 MG tablet Take 20 mg by mouth daily.  Marland Kitchen zinc oxide 20 % ointment Apply 1 application topically as needed for irritation. To buttocks after every incontinent episode and as needed for redness. May keep at bedside   No facility-administered encounter medications on file as of 06/23/2019.    ROS was provided with assistance of staff.  Review of Systems  Constitutional: Positive for unexpected weight change. Negative for activity change, appetite change, chills, diaphoresis, fatigue and fever.       Weight gain  HENT: Positive for hearing loss. Negative for congestion and voice change.   Respiratory: Positive for cough and shortness of breath. Negative for wheezing.   Cardiovascular: Positive for leg swelling. Negative for chest pain and palpitations.  Gastrointestinal: Negative for abdominal distention, abdominal pain, constipation, diarrhea, nausea and vomiting.  Genitourinary: Negative for difficulty urinating, dysuria and urgency.  Musculoskeletal: Positive  for arthralgias, back pain and gait problem.  Skin: Negative for color change and pallor.  Neurological: Negative for dizziness, speech difficulty, weakness and headaches.       Memory lapses.   Psychiatric/Behavioral: Negative for agitation, hallucinations and sleep disturbance. The patient is not nervous/anxious.     Immunization History  Administered Date(s) Administered  . Influenza, High Dose Seasonal PF 07/12/2017   Pertinent  Health Maintenance Due  Topic Date Due  . DEXA SCAN  02/20/1990  . PNA vac Low Risk Adult (1 of 2 - PCV13) 02/20/1990  . INFLUENZA VACCINE  05/20/2019   No flowsheet data found. Functional Status Survey:    Vitals:   06/23/19 1449  BP: (!) 126/56  Pulse: (!) 58  Resp: 20  Temp: (!) 97.1 F (36.2 C)  SpO2: 95%  Weight: 121 lb 9.6 oz (55.2 kg)  Height: 5' (1.524 m)   Body mass index is 23.75 kg/m. Physical Exam Constitutional:      General: She is not in acute distress.    Appearance: Normal appearance. She is normal weight. She is not ill-appearing, toxic-appearing or diaphoretic.  HENT:     Head: Normocephalic and atraumatic.     Nose: Nose normal.     Mouth/Throat:     Mouth: Mucous membranes are moist.  Eyes:     Extraocular Movements: Extraocular movements intact.     Conjunctiva/sclera: Conjunctivae normal.     Pupils: Pupils are equal, round, and reactive to light.  Neck:     Musculoskeletal: Normal range of motion and neck supple.  Cardiovascular:     Rate and Rhythm: Normal rate. Rhythm irregular.     Heart sounds: No murmur.  Pulmonary:     Breath sounds: Rhonchi and rales present. No wheezing.     Comments: Scattered crackles.  Abdominal:     General: Bowel sounds are normal.     Palpations: Abdomen is soft.     Tenderness: There is no abdominal tenderness. There is no right CVA tenderness, left CVA tenderness, guarding or rebound.  Musculoskeletal:     Right lower leg: Edema present.     Left lower leg: Edema present.      Comments: Trace edema BLE, mostly in left ankle. W/c for mobility.   Skin:    General: Skin is warm and dry.  Neurological:     General: No focal deficit present.     Mental Status: She is alert. Mental status  is at baseline.     Cranial Nerves: No cranial nerve deficit.     Motor: No weakness.     Coordination: Coordination normal.     Gait: Gait abnormal.     Comments: Oriented to person, place.   Psychiatric:        Mood and Affect: Mood normal.        Behavior: Behavior normal.     Labs reviewed: Recent Labs    12/21/18 0019 12/22/18 0304 12/23/18 0207  01/30/19 03/30/19 04/25/19 04/26/19  NA 136 136 132*   < > 137 136* 137  --   K 5.0 4.1 4.2   < > 4.7 4.4 4.2  --   CL 106 103 102  --   --   --   --  102  CO2 23 25 25   --   --   --   --  28  GLUCOSE 140* 137* 110*  --   --   --   --   --   BUN 35* 14 20   < > 25* 25* 48*  --   CREATININE 1.21* 0.91 1.37*   < > 1.2* 1.1 1.1  --   CALCIUM 8.9 8.3* 8.4*  --   --   --   --  9.0  MG  --  1.8  --   --   --   --   --   --    < > = values in this interval not displayed.   Recent Labs    12/22/18 0304 12/23/18 0207 03/30/19  AST 86* 53* 20  ALT 64* 37 11  ALKPHOS 82 69 92  BILITOT 0.6 0.7  --   PROT 5.6* 5.4*  --   ALBUMIN 2.6* 2.3*  --    Recent Labs    12/21/18 0019 12/22/18 0304 12/23/18 0207 01/30/19 02/16/19 02/20/19 03/30/19  WBC 10.4 10.4 10.9* 7.6 10.8  --  7.9  NEUTROABS 7.5 9.4* 7.3  --   --   --   --   HGB 11.3* 9.3* 8.0* 9.5* 10.2*  10.2* 9.8* 9.8*  HCT 35.8* 28.7* 25.4* 28* 31*  31* 29* 30*  MCV 93.2 93.8 93.7  --   --   --   --   PLT 319 234 189 431* 387  387 419* 532*   Lab Results  Component Value Date   TSH 2.600 09/19/2018   No results found for: HGBA1C No results found for: CHOL, HDL, LDLCALC, LDLDIRECT, TRIG, CHOLHDL  Significant Diagnostic Results in last 30 days:  No results found.  Assessment/Plan CKD (chronic kidney disease) stage 4, GFR 15-29 ml/min (HCC) 04/26/19  Bun/creat 48/1.17 06/22/19 Na 139, K 4.8, Bun 50, creat 1.54, wbc 6.2, Hgb 10.6, plt 277 06/23/19 hold Furosemide x 3 days, then start Furosemide 20mg  qd, 4days/wk,  BMP one week. Wt 3x/wk.    Weight gain Weight gained #8 Ibs in the past 5-6 weeks, #113 Ibs 05/19/19, #121Ibs 06/23/19, her baseline wt #119Ibs 05/2019. Desirable. Wt 3x/wk to monitor for s/s of fluid retention.   Depression due to dementia [facility]) continue Mirtazapine 7.5mg  qhs for weight/mood, continue Wellbutrin 150mg  qd, Quetiapine 12.5mg  prn. Continue Memantine 5mg  bid for memory.   Diastolic dysfunction [id number] echocardiogram EF 0000000, grade I diastolic dysfunction. Monitor for s/s of fluid overload since Torsemide is on hold x 3 days, then reduce to 4x/wk     Family/ staff Communication: plan of care reviewed with the patient and charge nurse.  Labs/tests ordered:  BMP one week  Time spend 25 minutes.

## 2019-07-07 ENCOUNTER — Non-Acute Institutional Stay (SKILLED_NURSING_FACILITY): Payer: Medicare Other | Admitting: Nurse Practitioner

## 2019-07-07 ENCOUNTER — Encounter: Payer: Self-pay | Admitting: Nurse Practitioner

## 2019-07-07 DIAGNOSIS — I10 Essential (primary) hypertension: Secondary | ICD-10-CM | POA: Diagnosis not present

## 2019-07-07 DIAGNOSIS — F0280 Dementia in other diseases classified elsewhere without behavioral disturbance: Secondary | ICD-10-CM

## 2019-07-07 DIAGNOSIS — F0393 Unspecified dementia, unspecified severity, with mood disturbance: Secondary | ICD-10-CM

## 2019-07-07 DIAGNOSIS — F329 Major depressive disorder, single episode, unspecified: Secondary | ICD-10-CM

## 2019-07-07 DIAGNOSIS — I4819 Other persistent atrial fibrillation: Secondary | ICD-10-CM | POA: Diagnosis not present

## 2019-07-07 NOTE — Progress Notes (Signed)
Location:  [facility] Room Number: 32 Place of Service:  SNF (31) Provider:  Marlana Latus  NP  [name], MD  Patient Care Team: [name], MD as PCP - General (Internal Medicine) [name], MD as PCP - Cardiology (Cardiology) [name], MD as Consulting Physician (Internal Medicine) Ngetich, Nelda Bucks, NP as Nurse Practitioner (Family Medicine)  Extended Emergency Contact Information Primary Emergency Contact: Bacot,Bob Address: [address]           [place], [place] Phone: [phone] Relation: Son Secondary Emergency Contact: Philmore Pal[place] Phone: [phone] Mobile Phone: [phone] Relation: Daughter  Code Status: DNR Goals of care: Advanced Directive information Advanced Directives 06/23/2019  Does Patient Have a Medical Advance Directive? Yes  Type of Paramedic of [place];Living will;Out of facility DNR (pink MOST or yellow form)  Does patient want to make changes to medical advance directive? No - Patient declined  Copy of [place] in Chart? Yes - validated most recent copy scanned in chart (See row information)  Would patient like information on creating a medical advance directive? -  Pre-existing out of facility DNR order (yellow form or pink MOST form) Yellow form placed in chart (order not valid for inpatient use);Pink MOST form placed in chart (order not valid for inpatient use)     Chief Complaint  Patient presents with  . Acute Visit    medication review    HPI:  Pt is a 85 y.o. female seen today for an acute visit for psychotropic meds review. The patient resides in [facility] for safety, care assistance, on Memantine 5mg  bid for memory.  Her mood is stable, on Wellbutrin 150mg  qd, Mirtazapine 7.5mg  qd, Seroquel 12.5mg  prn-not used frquently. HTN, blood pressure is controlled, on Amlodipine 5mg  qd,  Carvedilol 12.5mg  bid.    Past Medical History:  Diagnosis Date  . Amputation of finger of right hand   . Aortic stenosis, mild 03/03/2016   By echo 08/2015  . Arthritis   . Back pain   . Bradycardia 08/28/2015  . CKD (chronic kidney disease) stage 3, GFR 30-59 ml/min (HCC)   . Closed right hip fracture ([place]) 12/2018  . Depression   . GERD (gastroesophageal reflux disease)   . GI bleed due to NSAIDs   . Hyperlipidemia   . Hypertension   . Macular degeneration   . Maxillary sinusitis   . MVP (mitral valve prolapse) 03/03/2016   Bileaflet MVP with mild MR by echo 2016  . Old MI (myocardial infarction)    NSTEMI secondary to stress MI/Takotsubo CM, minimal nonobstructive ASCAD at cath  . Osteoporosis   . Persistent atrial fibrillation 2006   not on anticoagulation due to GI bleed and increased fall risk  . PMR (polymyalgia rheumatica) (HCC)   . SIADH (syndrome of inappropriate ADH production) ([place])   . Takotsubo cardiomyopathy    EF normalized to 70%  . Ventricular tachycardia ([place])    on initial presentation of MI   Past Surgical History:  Procedure Laterality Date  . CARDIAC CATHETERIZATION     normal coronary arteries  . CHOLECYSTECTOMY  11/10/2011   Procedure: LAPAROSCOPIC CHOLECYSTECTOMY WITH INTRAOPERATIVE CHOLANGIOGRAM;  Surgeon: Pedro Earls, MD;  Location: [facility];  Service: General;  Laterality: N/A;  . EYE SURGERY  06/10/11   membrane peel   . INTRAMEDULLARY (IM) NAIL INTERTROCHANTERIC Right 12/21/2018   Procedure:  INTRAMEDULLARY (IM) NAIL INTERTROCHANTRIC;  Surgeon: Nicholes Stairs, MD;  Location: [facility];  Service: Orthopedics;  Laterality: Right;  . NASAL SINUS SURGERY Left   . ROTATOR CUFF REPAIR  1998  . UMBILICAL HERNIA REPAIR  11/10/2011   Procedure: HERNIA REPAIR UMBILICAL ADULT;  Surgeon: Pedro Earls, MD;  Location: [facility];  Service: General;  Laterality: N/A;  . WISDOM TOOTH EXTRACTION      Allergies  Allergen Reactions  . Amlodipine  Swelling    To feet and ankles   . Avelox [Moxifloxacin Hcl In Nacl] Other (See Comments)    Pt does not remember   . Biaxin [Clarithromycin]   . Ceftin [Cefuroxime Axetil]   . Duragesic Disc Transdermal System [Fentanyl] Other (See Comments)    Reaction unknown  . Morphine And Related Other (See Comments)    "My Sister and daughter can,t take it. I've had it before without problems."  . Parathyroid Hormone (Recomb) Other (See Comments)    Made skin dry  . Teriparatide   . Lidocaine Rash    Outpatient Encounter Medications as of 07/07/2019  Medication Sig  . acetaminophen (TYLENOL) 500 MG tablet Take 500 mg by mouth every 6 (six) hours as needed for mild pain, fever or headache.  Marland Kitchen acetaminophen (TYLENOL) 500 MG tablet Take 500 mg by mouth daily.  Marland Kitchen albuterol (PROVENTIL) (2.5 MG/3ML) 0.083% nebulizer solution Take 2.5 mg by nebulization every 6 (six) hours as needed for wheezing or shortness of breath.  . Amino Acids-Protein Hydrolys (FEEDING SUPPLEMENT, PRO-STAT SUGAR FREE 64,) LIQD Take 30 mLs by mouth daily.  Marland Kitchen amLODipine (NORVASC) 5 MG tablet TAKE 1 TABLET BY MOUTH EVERY DAY  . azelastine (ASTELIN) 0.1 % nasal spray Place 2 sprays into both nostrils 2 (two) times daily as needed for allergies.   . budesonide-formoterol (SYMBICORT) 80-4.5 MCG/ACT inhaler Inhale 2 puffs into the lungs 2 (two) times daily.   Marland Kitchen buPROPion (WELLBUTRIN SR) 150 MG 12 hr tablet Take 1 tablet (150 mg total) by mouth every morning.  . carboxymethylcellul-glycerin (LUBRICATING EYE DROPS) 0.5-0.9 % ophthalmic solution Place 1 drop into both eyes 3 (three) times daily as needed for dry eyes. For allergies  . carvedilol (COREG) 12.5 MG tablet Take 1 tablet (12.5 mg total) by mouth 2 (two) times daily with a meal.  . cetirizine (ZYRTEC) 10 MG tablet Take 10 mg by mouth daily.   Marland Kitchen demeclocycline (DECLOMYCIN) 150 MG tablet Take 150 mg by mouth 2 (two) times daily.   . diclofenac sodium (VOLTAREN) 1 % GEL Apply 1  application topically 4 (four) times daily as needed (pain).   . famotidine (PEPCID) 20 MG tablet Take 20 mg by mouth daily.  . Ferrous Sulfate (IRON) 325 (65 Fe) MG TABS Take 1 tablet by mouth every other day. Give with meals  . lactose free nutrition (BOOST) LIQD Take 237 mLs by mouth. Twice a day with meals  . memantine (NAMENDA) 5 MG tablet Take 5 mg by mouth 2 (two) times daily.   . mirtazapine (REMERON) 7.5 MG tablet Take 7.5 mg by mouth at bedtime.  . ondansetron (ZOFRAN) 4 MG tablet Take 1 tablet (4 mg total) by mouth every 6 (six) hours as needed for nausea.  . polyethylene glycol (MIRALAX / GLYCOLAX) packet Take 17 g by mouth daily.   . QUEtiapine (SEROQUEL) 25 MG tablet Take 12.5 mg by mouth daily as needed.   . torsemide (DEMADEX) 20 MG tablet Take 20 mg by mouth. Once A  Day on Mon, Wed, Fri  . zinc oxide 20 % ointment Apply 1 application topically as needed for irritation. To buttocks after every incontinent episode and as needed for redness. May keep at bedside  . [DISCONTINUED] torsemide (DEMADEX) 20 MG tablet Take 20 mg by mouth daily.   No facility-administered encounter medications on file as of 07/07/2019.    ROS was provided with assistance of staff.  Review of Systems  Constitutional: Negative for activity change, appetite change, chills, diaphoresis, fatigue, fever and unexpected weight change.  HENT: Positive for hearing loss. Negative for congestion and voice change.   Respiratory: Positive for cough and shortness of breath. Negative for wheezing.        Chronic cough, DOE  Cardiovascular: Positive for leg swelling. Negative for palpitations.  Gastrointestinal: Negative for abdominal distention, abdominal pain, constipation, diarrhea, nausea and vomiting.  Genitourinary: Negative for difficulty urinating, dysuria and urgency.  Musculoskeletal: Positive for gait problem.  Skin: Negative for color change and pallor.  Neurological: Negative for dizziness, speech  difficulty, weakness and headaches.       Dementia  Psychiatric/Behavioral: Negative for agitation, behavioral problems, hallucinations and sleep disturbance. The patient is not nervous/anxious.     Immunization History  Administered Date(s) Administered  . Influenza, High Dose Seasonal PF 07/12/2017   Pertinent  Health Maintenance Due  Topic Date Due  . DEXA SCAN  02/20/1990  . PNA vac Low Risk Adult (1 of 2 - PCV13) 02/20/1990  . INFLUENZA VACCINE  05/20/2019   No flowsheet data found. Functional Status Survey:    Vitals:   07/07/19 1604  BP: (!) 142/66  Pulse: 68  Resp: 20  Temp: 97.8 F (36.6 C)  SpO2: 95%  Weight: 121 lb 14.4 oz (55.3 kg)  Height: 5' (1.524 m)   Body mass index is 23.81 kg/m. Physical Exam Vitals signs and nursing note reviewed.  Constitutional:      General: She is not in acute distress.    Appearance: Normal appearance. She is not toxic-appearing or diaphoretic.  HENT:     Head: Normocephalic and atraumatic.     Nose: Nose normal.     Mouth/Throat:     Mouth: Mucous membranes are moist.  Eyes:     Extraocular Movements: Extraocular movements intact.     Conjunctiva/sclera: Conjunctivae normal.     Pupils: Pupils are equal, round, and reactive to light.  Neck:     Musculoskeletal: Normal range of motion and neck supple.  Cardiovascular:     Rate and Rhythm: Normal rate. Rhythm irregular.     Heart sounds: No murmur.  Pulmonary:     Breath sounds: Rales present. No wheezing or rhonchi.     Comments: Bibasilar rales.  Abdominal:     General: Bowel sounds are normal. There is no distension.     Palpations: Abdomen is soft.     Tenderness: There is no abdominal tenderness. There is no right CVA tenderness, left CVA tenderness, guarding or rebound.  Musculoskeletal:     Right lower leg: Edema present.     Left lower leg: Edema present.     Comments: Trace edema BLE  Skin:    General: Skin is warm and dry.  Neurological:     General:  No focal deficit present.     Mental Status: She is alert. Mental status is at baseline.     Cranial Nerves: No cranial nerve deficit.     Motor: No weakness.     Coordination: Coordination  normal.     Gait: Gait abnormal.     Comments: Oriented person, place.   Psychiatric:        Mood and Affect: Mood normal.        Behavior: Behavior normal.     Labs reviewed: Recent Labs    12/21/18 0019 12/22/18 0304 12/23/18 0207  01/30/19 03/30/19 04/25/19 04/26/19  NA 136 136 132*   < > 137 136* 137  --   K 5.0 4.1 4.2   < > 4.7 4.4 4.2  --   CL 106 103 102  --   --   --   --  102  CO2 23 25 25   --   --   --   --  28  GLUCOSE 140* 137* 110*  --   --   --   --   --   BUN 35* 14 20   < > 25* 25* 48*  --   CREATININE 1.21* 0.91 1.37*   < > 1.2* 1.1 1.1  --   CALCIUM 8.9 8.3* 8.4*  --   --   --   --  9.0  MG  --  1.8  --   --   --   --   --   --    < > = values in this interval not displayed.   Recent Labs    12/22/18 0304 12/23/18 0207 03/30/19  AST 86* 53* 20  ALT 64* 37 11  ALKPHOS 82 69 92  BILITOT 0.6 0.7  --   PROT 5.6* 5.4*  --   ALBUMIN 2.6* 2.3*  --    Recent Labs    12/21/18 0019 12/22/18 0304 12/23/18 0207 01/30/19 02/16/19 02/20/19 03/30/19  WBC 10.4 10.4 10.9* 7.6 10.8  --  7.9  NEUTROABS 7.5 9.4* 7.3  --   --   --   --   HGB 11.3* 9.3* 8.0* 9.5* 10.2*  10.2* 9.8* 9.8*  HCT 35.8* 28.7* 25.4* 28* 31*  31* 29* 30*  MCV 93.2 93.8 93.7  --   --   --   --   PLT 319 234 189 431* 387  387 419* 532*   Lab Results  Component Value Date   TSH 2.600 09/19/2018   No results found for: HGBA1C No results found for: CHOL, HDL, LDLCALC, LDLDIRECT, TRIG, CHOLHDL  Significant Diagnostic Results in last 30 days:  No results found.  Assessment/Plan Depression due to dementia [facility]) Her mood is stable, continue Wellbutrin 150mg  qd, Mirtazapine 7.5mg  qd, dc Seroquel 12.5mg  qd prn. Continue Memantine 5mg  bid for memory. Observe.   Essential hypertension, benign Blood  pressure is controlled, continue Amlodipine 5mg  qd, Carvedilol 12.5mg  bid.   Persistent atrial fibrillation (HCC) Heart rate is in control.      Family/ staff Communication: plan of care reviewed with the patient and charge nurse.   Labs/tests ordered:  none  Time spend 25 minutes.

## 2019-07-07 NOTE — Assessment & Plan Note (Addendum)
Her mood is stable, continue Wellbutrin 150mg  qd, Mirtazapine 7.5mg  qd, dc Seroquel 12.5mg  qd prn. Continue Memantine 5mg  bid for memory. Observe.

## 2019-07-07 NOTE — Assessment & Plan Note (Signed)
Blood pressure is controlled, continue Amlodipine 5mg  qd, Carvedilol 12.5mg  bid.

## 2019-07-07 NOTE — Assessment & Plan Note (Signed)
Heart rate is in control.  

## 2019-07-20 ENCOUNTER — Encounter: Payer: Self-pay | Admitting: Nurse Practitioner

## 2019-07-20 ENCOUNTER — Non-Acute Institutional Stay (SKILLED_NURSING_FACILITY): Payer: Medicare Other | Admitting: Nurse Practitioner

## 2019-07-20 DIAGNOSIS — R600 Localized edema: Secondary | ICD-10-CM

## 2019-07-20 DIAGNOSIS — K219 Gastro-esophageal reflux disease without esophagitis: Secondary | ICD-10-CM

## 2019-07-20 DIAGNOSIS — F329 Major depressive disorder, single episode, unspecified: Secondary | ICD-10-CM | POA: Diagnosis not present

## 2019-07-20 DIAGNOSIS — I5189 Other ill-defined heart diseases: Secondary | ICD-10-CM

## 2019-07-20 DIAGNOSIS — I4819 Other persistent atrial fibrillation: Secondary | ICD-10-CM | POA: Diagnosis not present

## 2019-07-20 DIAGNOSIS — R635 Abnormal weight gain: Secondary | ICD-10-CM

## 2019-07-20 DIAGNOSIS — J90 Pleural effusion, not elsewhere classified: Secondary | ICD-10-CM

## 2019-07-20 DIAGNOSIS — D649 Anemia, unspecified: Secondary | ICD-10-CM

## 2019-07-20 DIAGNOSIS — F0280 Dementia in other diseases classified elsewhere without behavioral disturbance: Secondary | ICD-10-CM

## 2019-07-20 DIAGNOSIS — I10 Essential (primary) hypertension: Secondary | ICD-10-CM | POA: Diagnosis not present

## 2019-07-20 DIAGNOSIS — J42 Unspecified chronic bronchitis: Secondary | ICD-10-CM

## 2019-07-20 DIAGNOSIS — F0393 Unspecified dementia, unspecified severity, with mood disturbance: Secondary | ICD-10-CM

## 2019-07-20 NOTE — Assessment & Plan Note (Signed)
Heart rate is in control, continue Carvedilol 12.5mg  bid.

## 2019-07-20 NOTE — Assessment & Plan Note (Signed)
Blood pressure is controlled, continue Amlodipine 5mg  qd, Carvedilol 12.5mg  bid.

## 2019-07-20 NOTE — Assessment & Plan Note (Addendum)
Stable, continue Fe qod

## 2019-07-20 NOTE — Assessment & Plan Note (Signed)
Compensated clinically, trace edema BLE, continue Torsemide 20mg  3x/wk.

## 2019-07-20 NOTE — Assessment & Plan Note (Signed)
Trace edema BLE, continue Torsemide 20mg  3x/wk, monitor weight

## 2019-07-20 NOTE — Assessment & Plan Note (Signed)
Continue to monitor for weight, s/s of fluid retention, may consider dc Mirtazapine if weight gain persists.

## 2019-07-20 NOTE — Assessment & Plan Note (Addendum)
Her mood is stable, continue Wellbutrin 150mg  qd. Continue Memantine 5mg  bid for memory.

## 2019-07-20 NOTE — Assessment & Plan Note (Signed)
Stable, continue Famotidine 20mg qd.  

## 2019-07-20 NOTE — Progress Notes (Signed)
Location:   S[facility] [place] Room Number: 36 Place of Service:  SNF (31) Provider:  Takita Riecke NP  [name], MD  Patient Care Team: [name], MD as PCP - General (Internal Medicine) [name], MD as PCP - Cardiology (Cardiology) [name], MD as Consulting Physician (Internal Medicine) Ngetich, Nelda Bucks, NP as Nurse Practitioner (Family Medicine)  Extended Emergency Contact Information Primary Emergency Contact: Wade,Bob Address: [address]           [place], [place] Phone: [phone] Relation: Son Secondary Emergency Contact: Philmore [place] Phone: [phone] Mobile Phone: [phone] Relation: Daughter  Code Status:  DNR  Goals of care: Advanced Directive information Advanced Directives 07/24/2019  Does Patient Have a Medical Advance Directive? Yes  Type of Paramedic of [place];Living will;Out of facility DNR (pink MOST or yellow form)  Does patient want to make changes to medical advance directive? No - Patient declined  Copy of [place] in Chart? Yes - validated most recent copy scanned in chart (See row information)  Would patient like information on creating a medical advance directive? -  Pre-existing out of facility DNR order (yellow form or pink MOST form) Yellow form placed in chart (order not valid for inpatient use);Pink MOST form placed in chart (order not valid for inpatient use)     Chief Complaint  Patient presents with  . Medical Management of Chronic Issues    HPI:  Pt is a 85 y.o. female seen today for medical management of chronic diseases.    The patient resides in SN[facility] for safety, care assistance, her weight has been stabilized, has gained #6Ibs in the past 2-3 weeks, on Mirtazapine 7.5mg  qd. She takes Memantine 5mg  bid for memory, Wellbutrin 150mg  qd for mood. HTN/afib, heart rate and  blood  pressure is controlled on Amlodipine 5mg  qd, Carvedilol 12.5mg  bid. GERD, stable, on Famotidine 20mg  qd. Anemia, stable, taking Fe qod. Trace edema BLE/diastolic dysfunction, on Torsemide 20mg  3x/wk. Chronic bronchitis, started Symbicort bid 06/19/19, improved, but not completely resolved. Left pleural effusion, repeat CXR 4 wks-06/19/19, amy consider CT if persists.    Past Medical History:  Diagnosis Date  . Amputation of finger of right hand   . Aortic stenosis, mild 03/03/2016   By echo 08/2015  . Arthritis   . Back pain   . Bradycardia 08/28/2015  . CKD (chronic kidney disease) stage 3, GFR 30-59 ml/min   . Closed right hip fracture ([place]) 12/2018  . Depression   . GERD (gastroesophageal reflux disease)   . GI bleed due to NSAIDs   . Hyperlipidemia   . Hypertension   . Macular degeneration   . Maxillary sinusitis   . MVP (mitral valve prolapse) 03/03/2016   Bileaflet MVP with mild MR by echo 2016  . Old MI (myocardial infarction)    NSTEMI secondary to stress MI/Takotsubo CM, minimal nonobstructive ASCAD at cath  . Osteoporosis   . Persistent atrial fibrillation ([place]) 2006   not on anticoagulation due to GI bleed and increased fall risk  . PMR (polymyalgia rheumatica) (HCC)   . SIADH (syndrome of inappropriate ADH production) ([place])   . Takotsubo cardiomyopathy    EF normalized to 70%  . Ventricular tachycardia ([place])    on initial presentation of MI   Past Surgical History:  Procedure Laterality Date  . CARDIAC CATHETERIZATION     normal coronary  arteries  . CHOLECYSTECTOMY  11/10/2011   Procedure: LAPAROSCOPIC CHOLECYSTECTOMY WITH INTRAOPERATIVE CHOLANGIOGRAM;  Surgeon: Pedro Earls, MD;  Location: [facility];  Service: General;  Laterality: N/A;  . EYE SURGERY  06/10/11   membrane peel   . INTRAMEDULLARY (IM) NAIL INTERTROCHANTERIC Right 12/21/2018   Procedure: INTRAMEDULLARY (IM) NAIL INTERTROCHANTRIC;  Surgeon: Nicholes Stairs, MD;  Location: [facility];  Service:  Orthopedics;  Laterality: Right;  . NASAL SINUS SURGERY Left   . ROTATOR CUFF REPAIR  1998  . UMBILICAL HERNIA REPAIR  11/10/2011   Procedure: HERNIA REPAIR UMBILICAL ADULT;  Surgeon: Pedro Earls, MD;  Location: [facility];  Service: General;  Laterality: N/A;  . WISDOM TOOTH EXTRACTION      Allergies  Allergen Reactions  . Amlodipine Swelling    To feet and ankles   . Avelox [Moxifloxacin Hcl In Nacl] Other (See Comments)    Pt does not remember   . Biaxin [Clarithromycin]   . Ceftin [Cefuroxime Axetil]   . Duragesic Disc Transdermal System [Fentanyl] Other (See Comments)    Reaction unknown  . Morphine And Related Other (See Comments)    "My Sister and daughter can,t take it. I've had it before without problems."  . Parathyroid Hormone (Recomb) Other (See Comments)    Made skin dry  . Teriparatide   . Lidocaine Rash    Allergies as of 07/20/2019      Reactions   Amlodipine Swelling   To feet and ankles    Avelox [moxifloxacin Hcl In Nacl] Other (See Comments)   Pt does not remember   Biaxin [clarithromycin]    Ceftin [cefuroxime Axetil]    Duragesic Disc Transdermal System [fentanyl] Other (See Comments)   Reaction unknown   Morphine And Related Other (See Comments)   "My Sister and daughter can,t take it. I've had it before without problems."   Parathyroid Hormone (recomb) Other (See Comments)   Made skin dry   Teriparatide    Lidocaine Rash      Medication List       Accurate as of July 20, 2019 11:59 PM. If you have any questions, ask your nurse or doctor.        STOP taking these medications   QUEtiapine 25 MG tablet Commonly known as: SEROQUEL Stopped by: Alden Feagan X Harlynn Kimbell, NP     TAKE these medications   acetaminophen 500 MG tablet Commonly known as: TYLENOL Take 500 mg by mouth every 6 (six) hours as needed for mild pain, fever or headache.   acetaminophen 500 MG tablet Commonly known as: TYLENOL Take 500 mg by mouth daily.   albuterol (2.5  MG/3ML) 0.083% nebulizer solution Commonly known as: PROVENTIL Take 2.5 mg by nebulization every 6 (six) hours as needed for wheezing or shortness of breath.   amLODipine 5 MG tablet Commonly known as: NORVASC TAKE 1 TABLET BY MOUTH EVERY DAY   azelastine 0.1 % nasal spray Commonly known as: ASTELIN Place 2 sprays into both nostrils 2 (two) times daily as needed for allergies.   budesonide-formoterol 80-4.5 MCG/ACT inhaler Commonly known as: SYMBICORT Inhale 2 puffs into the lungs 2 (two) times daily.   buPROPion 150 MG 12 hr tablet Commonly known as: WELLBUTRIN SR Take 1 tablet (150 mg total) by mouth every morning.   carvedilol 12.5 MG tablet Commonly known as: COREG Take 1 tablet (12.5 mg total) by mouth 2 (two) times daily with a meal.   cetirizine 10 MG tablet Commonly known as: ZYRTEC  Take 10 mg by mouth daily.   demeclocycline 150 MG tablet Commonly known as: DECLOMYCIN Take 150 mg by mouth 2 (two) times daily.   diclofenac sodium 1 % Gel Commonly known as: VOLTAREN Apply 1 application topically 4 (four) times daily as needed (pain).   famotidine 20 MG tablet Commonly known as: PEPCID Take 20 mg by mouth daily.   feeding supplement (PRO-STAT SUGAR FREE 64) Liqd Take 30 mLs by mouth daily.   Iron 325 (65 Fe) MG Tabs Take 1 tablet by mouth every other day. Give with meals   lactose free nutrition Liqd Take 237 mLs by mouth. Twice a day with meals   Lubricating Eye Drops 0.5-0.9 % ophthalmic solution Generic drug: carboxymethylcellul-glycerin Place 1 drop into both eyes 3 (three) times daily as needed for dry eyes. For allergies   memantine 5 MG tablet Commonly known as: NAMENDA Take 5 mg by mouth 2 (two) times daily.   mirtazapine 7.5 MG tablet Commonly known as: REMERON Take 7.5 mg by mouth at bedtime.   ondansetron 4 MG tablet Commonly known as: ZOFRAN Take 1 tablet (4 mg total) by mouth every 6 (six) hours as needed for nausea.   polyethylene  glycol 17 g packet Commonly known as: MIRALAX / GLYCOLAX Take 17 g by mouth daily.   torsemide 20 MG tablet Commonly known as: DEMADEX Take 20 mg by mouth. Once A Day on Mon, Wed, Fri   zinc oxide 20 % ointment Apply 1 application topically as needed for irritation. To buttocks after every incontinent episode and as needed for redness. May keep at bedside      ROS was provided with assistance of staff Review of Systems  Constitutional: Positive for unexpected weight change. Negative for activity change, appetite change, chills, diaphoresis, fatigue and fever.       Weight gained about 35-6Ibs in the past 2-3 weeks.  HENT: Positive for hearing loss. Negative for congestion and voice change.   Respiratory: Positive for shortness of breath and wheezing. Negative for cough.        DOE  Cardiovascular: Positive for leg swelling. Negative for chest pain and palpitations.  Gastrointestinal: Negative for abdominal distention, abdominal pain, constipation, diarrhea, nausea and vomiting.  Genitourinary: Negative for difficulty urinating, dysuria and urgency.  Musculoskeletal: Positive for arthralgias and gait problem.  Skin: Negative for color change and pallor.  Neurological: Negative for dizziness, speech difficulty, weakness and headaches.       Dementia  Psychiatric/Behavioral: Negative for agitation, behavioral problems, hallucinations and sleep disturbance. The patient is not nervous/anxious.     Immunization History  Administered Date(s) Administered  . Influenza, High Dose Seasonal PF 07/12/2017   Pertinent  Health Maintenance Due  Topic Date Due  . DEXA SCAN  02/20/1990  . PNA vac Low Risk Adult (1 of 2 - PCV13) 02/20/1990  . INFLUENZA VACCINE  05/20/2019   No flowsheet data found. Functional Status Survey:    Vitals:   07/20/19 1044  BP: 140/68  Pulse: 60  Resp: 19  Temp: 97.6 F (36.4 C)  SpO2: 96%  Weight: 127 lb 4.8 oz (57.7 kg)   Body mass index is 24.86  kg/m. Physical Exam Vitals signs and nursing note reviewed.  Constitutional:      General: She is not in acute distress.    Appearance: Normal appearance. She is normal weight. She is not ill-appearing, toxic-appearing or diaphoretic.  HENT:     Head: Normocephalic and atraumatic.     Nose:  Nose normal.     Mouth/Throat:     Mouth: Mucous membranes are moist.  Eyes:     Extraocular Movements: Extraocular movements intact.     Conjunctiva/sclera: Conjunctivae normal.     Pupils: Pupils are equal, round, and reactive to light.  Neck:     Musculoskeletal: Normal range of motion and neck supple.  Cardiovascular:     Rate and Rhythm: Normal rate. Rhythm irregular.     Heart sounds: Murmur present.  Pulmonary:     Breath sounds: Wheezing and rales present. No rhonchi.     Comments: Bibasilar rales. Scattered expiratory wheezes appreciated R+L lungs.   Chest:     Chest wall: No tenderness.  Abdominal:     General: Bowel sounds are normal. There is no distension.     Palpations: Abdomen is soft.     Tenderness: There is no abdominal tenderness. There is no right CVA tenderness, guarding or rebound.  Musculoskeletal:     Right lower leg: Edema present.     Left lower leg: Edema present.     Comments: Trace edema BLE  Skin:    General: Skin is warm and dry.  Neurological:     General: No focal deficit present.     Mental Status: She is alert. Mental status is at baseline.     Cranial Nerves: No cranial nerve deficit.     Motor: No weakness.     Coordination: Coordination normal.     Gait: Gait abnormal.     Comments: Oriented to person, place.  Psychiatric:        Mood and Affect: Mood normal.        Behavior: Behavior normal.     Labs reviewed: Recent Labs    12/21/18 0019 12/22/18 0304 12/23/18 0207  01/30/19 03/30/19 04/25/19 04/26/19  NA 136 136 132*   < > 137 136* 137  --   K 5.0 4.1 4.2   < > 4.7 4.4 4.2  --   CL 106 103 102  --   --   --   --  102  CO2 23 25 25    --   --   --   --  28  GLUCOSE 140* 137* 110*  --   --   --   --   --   BUN 35* 14 20   < > 25* 25* 48*  --   CREATININE 1.21* 0.91 1.37*   < > 1.2* 1.1 1.1  --   CALCIUM 8.9 8.3* 8.4*  --   --   --   --  9.0  MG  --  1.8  --   --   --   --   --   --    < > = values in this interval not displayed.   Recent Labs    12/22/18 0304 12/23/18 0207 03/30/19  AST 86* 53* 20  ALT 64* 37 11  ALKPHOS 82 69 92  BILITOT 0.6 0.7  --   PROT 5.6* 5.4*  --   ALBUMIN 2.6* 2.3*  --    Recent Labs    12/21/18 0019 12/22/18 0304 12/23/18 0207 01/30/19 02/16/19 02/20/19 03/30/19  WBC 10.4 10.4 10.9* 7.6 10.8  --  7.9  NEUTROABS 7.5 9.4* 7.3  --   --   --   --   HGB 11.3* 9.3* 8.0* 9.5* 10.2*  10.2* 9.8* 9.8*  HCT 35.8* 28.7* 25.4* 28* 31*  31* 29* 30*  MCV 93.2  93.8 93.7  --   --   --   --   PLT 319 234 189 431* 387  387 419* 532*   Lab Results  Component Value Date   TSH 2.600 09/19/2018   No results found for: HGBA1C No results found for: CHOL, HDL, LDLCALC, LDLDIRECT, TRIG, CHOLHDL  Significant Diagnostic Results in last 30 days:  No results found.  Assessment/Plan Persistent atrial fibrillation (HCC) Heart rate is in control, continue Carvedilol 12.5mg  bid.   Essential hypertension, benign Blood pressure is controlled, continue Amlodipine 5mg  qd, Carvedilol 12.5mg  bid.   Esophageal reflux Stable, continue Famotidine 20mg  qd.   Depression due to dementia [facility]) Her mood is stable, continue Wellbutrin 150mg  qd. Continue Memantine 5mg  bid for memory.   Weight gain Continue to monitor for weight, s/s of fluid retention, may consider dc Mirtazapine if weight gain persists.   Edema of extremities Trace edema BLE, continue Torsemide 20mg  3x/wk, monitor weight  Diastolic dysfunction Compensated clinically, trace edema BLE, continue Torsemide 20mg  3x/wk.   Anemia Stable, continue Fe qod  Chronic bronchitis ([place]) started Symbicort bid 06/19/19, improved, but not completely  resolved. Pend f/u CXR  Pleural effusion Left pleural effusion, repeat CXR 4 wks-06/19/19, amy consider CT if persists.      Family/ staff Communication: plan of care reviewed with the patient and charge nurse.   Labs/tests ordered:  none  Time spend 25 minutes.

## 2019-07-21 DIAGNOSIS — J90 Pleural effusion, not elsewhere classified: Secondary | ICD-10-CM | POA: Insufficient documentation

## 2019-07-21 DIAGNOSIS — J42 Unspecified chronic bronchitis: Secondary | ICD-10-CM | POA: Insufficient documentation

## 2019-07-21 NOTE — Assessment & Plan Note (Signed)
Left pleural effusion, repeat CXR 4 wks-06/19/19, amy consider CT if persists.

## 2019-07-21 NOTE — Assessment & Plan Note (Addendum)
started Symbicort bid 06/19/19, improved, but not completely resolved. Pend f/u CXR

## 2019-07-24 ENCOUNTER — Encounter: Payer: Self-pay | Admitting: Internal Medicine

## 2019-07-24 ENCOUNTER — Non-Acute Institutional Stay (SKILLED_NURSING_FACILITY): Payer: Medicare Other | Admitting: Internal Medicine

## 2019-07-24 DIAGNOSIS — I5189 Other ill-defined heart diseases: Secondary | ICD-10-CM

## 2019-07-24 DIAGNOSIS — I10 Essential (primary) hypertension: Secondary | ICD-10-CM

## 2019-07-24 DIAGNOSIS — I4819 Other persistent atrial fibrillation: Secondary | ICD-10-CM

## 2019-07-24 DIAGNOSIS — J90 Pleural effusion, not elsewhere classified: Secondary | ICD-10-CM

## 2019-07-24 NOTE — Progress Notes (Signed)
Location:    [facility] Room Number: 41 Place of Service:  SNF (31) Provider:  Veleta Miners MD  [name], MD  Patient Care Team: [name], MD as PCP - General (Internal Medicine) [name], MD as PCP - Cardiology (Cardiology) [name], MD as Consulting Physician (Internal Medicine) Ngetich, Nelda Bucks, NP as Nurse Practitioner (Family Medicine)  Extended Emergency Contact Information Primary Emergency Contact: Embree,Bob Address: [address]           [place], [place] Phone: [phone] Relation: Son Secondary Emergency Contact: Philmore [place] Phone: [phone] Mobile Phone: [phone] Relation: Daughter  Code Status:  DNR Goals of care: Advanced Directive information Advanced Directives 07/24/2019  Does Patient Have a Medical Advance Directive? Yes  Type of Paramedic of [place];Living will;Out of facility DNR (pink MOST or yellow form)  Does patient want to make changes to medical advance directive? No - Patient declined  Copy of [place] in Chart? Yes - validated most recent copy scanned in chart (See row information)  Would patient like information on creating a medical advance directive? -  Pre-existing out of facility DNR order (yellow form or pink MOST form) Yellow form placed in chart (order not valid for inpatient use);Pink MOST form placed in chart (order not valid for inpatient use)     Chief Complaint  Patient presents with  . Acute Visit    Follow up on chest xray     HPI:  Pt is a 85 y.o. female seen today for an acute visit for follow-up of patient's x-ray  She has a history of Takotsubo's cardiomyopathy with EF of 55 to [id number], diastolic dysfunction, hypertension, PAF not on any anticoagulation, SIADH, depression She has been in SNF since 3/04 after her Right hip fracture.She underwent InterMedullary Implant on  03/04.  Patient has been stable in the facility.  Since she had come to the facility patient had vascular congestion and left pleural effusion.  Repeated x-rays have shown mild decrease in diffusion but she continues to have have it.  The x-ray at this time also showed chronic some changes.  Her BNP in the past has been low at 95. Overall patient is doing really well.  She has gained weight.  She is eating well sleeping well no behavioral issues denies any cough shortness of breath. Past Medical History:  Diagnosis Date  . Amputation of finger of right hand   . Aortic stenosis, mild 03/03/2016   By echo 08/2015  . Arthritis   . Back pain   . Bradycardia 08/28/2015  . CKD (chronic kidney disease) stage 3, GFR 30-59 ml/min   . Closed right hip fracture ([place]) 12/2018  . Depression   . GERD (gastroesophageal reflux disease)   . GI bleed due to NSAIDs   . Hyperlipidemia   . Hypertension   . Macular degeneration   . Maxillary sinusitis   . MVP (mitral valve prolapse) 03/03/2016   Bileaflet MVP with mild MR by echo 2016  . Old MI (myocardial infarction)    NSTEMI secondary to stress MI/Takotsubo CM, minimal nonobstructive ASCAD at cath  . Osteoporosis   . Persistent atrial fibrillation ([place]) 2006   not on anticoagulation due to GI bleed and increased fall risk  . PMR (polymyalgia rheumatica) (HCC)   . SIADH (syndrome of inappropriate ADH production) ([place])   . Takotsubo cardiomyopathy    EF normalized  to 70%  . Ventricular tachycardia ([place])    on initial presentation of MI   Past Surgical History:  Procedure Laterality Date  . CARDIAC CATHETERIZATION     normal coronary arteries  . CHOLECYSTECTOMY  11/10/2011   Procedure: LAPAROSCOPIC CHOLECYSTECTOMY WITH INTRAOPERATIVE CHOLANGIOGRAM;  Surgeon: Pedro Earls, MD;  Location: [facility];  Service: General;  Laterality: N/A;  . EYE SURGERY  06/10/11   membrane peel   . INTRAMEDULLARY (IM) NAIL INTERTROCHANTERIC Right 12/21/2018   Procedure:  INTRAMEDULLARY (IM) NAIL INTERTROCHANTRIC;  Surgeon: Nicholes Stairs, MD;  Location: [facility];  Service: Orthopedics;  Laterality: Right;  . NASAL SINUS SURGERY Left   . ROTATOR CUFF REPAIR  1998  . UMBILICAL HERNIA REPAIR  11/10/2011   Procedure: HERNIA REPAIR UMBILICAL ADULT;  Surgeon: Pedro Earls, MD;  Location: [facility];  Service: General;  Laterality: N/A;  . WISDOM TOOTH EXTRACTION      Allergies  Allergen Reactions  . Amlodipine Swelling    To feet and ankles   . Avelox [Moxifloxacin Hcl In Nacl] Other (See Comments)    Pt does not remember   . Biaxin [Clarithromycin]   . Ceftin [Cefuroxime Axetil]   . Duragesic Disc Transdermal System [Fentanyl] Other (See Comments)    Reaction unknown  . Morphine And Related Other (See Comments)    "My Sister and daughter can,t take it. I've had it before without problems."  . Parathyroid Hormone (Recomb) Other (See Comments)    Made skin dry  . Teriparatide   . Lidocaine Rash    Allergies as of 07/24/2019      Reactions   Amlodipine Swelling   To feet and ankles    Avelox [moxifloxacin Hcl In Nacl] Other (See Comments)   Pt does not remember   Biaxin [clarithromycin]    Ceftin [cefuroxime Axetil]    Duragesic Disc Transdermal System [fentanyl] Other (See Comments)   Reaction unknown   Morphine And Related Other (See Comments)   "My Sister and daughter can,t take it. I've had it before without problems."   Parathyroid Hormone (recomb) Other (See Comments)   Made skin dry   Teriparatide    Lidocaine Rash      Medication List       Accurate as of July 24, 2019 11:15 AM. If you have any questions, ask your nurse or doctor.        acetaminophen 500 MG tablet Commonly known as: TYLENOL Take 500 mg by mouth every 6 (six) hours as needed for mild pain, fever or headache.   acetaminophen 500 MG tablet Commonly known as: TYLENOL Take 500 mg by mouth daily.   albuterol (2.5 MG/3ML) 0.083% nebulizer solution Commonly  known as: PROVENTIL Take 2.5 mg by nebulization every 6 (six) hours as needed for wheezing or shortness of breath.   alum & mag hydroxide-simeth 200-200-20 MG/5ML suspension Commonly known as: MAALOX/MYLANTA Take 30 mLs by mouth every 4 (four) hours as needed for indigestion or heartburn.   amLODipine 5 MG tablet Commonly known as: NORVASC TAKE 1 TABLET BY MOUTH EVERY DAY   azelastine 0.1 % nasal spray Commonly known as: ASTELIN Place 2 sprays into both nostrils 2 (two) times daily as needed for allergies.   budesonide-formoterol 80-4.5 MCG/ACT inhaler Commonly known as: SYMBICORT Inhale 2 puffs into the lungs 2 (two) times daily.   buPROPion 150 MG 12 hr tablet Commonly known as: WELLBUTRIN SR Take 1 tablet (150 mg total) by mouth every morning.  carvedilol 12.5 MG tablet Commonly known as: COREG Take 1 tablet (12.5 mg total) by mouth 2 (two) times daily with a meal.   cetirizine 10 MG tablet Commonly known as: ZYRTEC Take 10 mg by mouth daily.   demeclocycline 150 MG tablet Commonly known as: DECLOMYCIN Take 150 mg by mouth 2 (two) times daily.   diclofenac sodium 1 % Gel Commonly known as: VOLTAREN Apply 1 application topically 4 (four) times daily as needed (pain).   famotidine 20 MG tablet Commonly known as: PEPCID Take 20 mg by mouth daily.   feeding supplement (PRO-STAT SUGAR FREE 64) Liqd Take 30 mLs by mouth daily.   Iron 325 (65 Fe) MG Tabs Take 1 tablet by mouth every other day. Give with meals   lactose free nutrition Liqd Take 237 mLs by mouth. Twice a day with meals   Lubricating Eye Drops 0.5-0.9 % ophthalmic solution Generic drug: carboxymethylcellul-glycerin Place 1 drop into both eyes 3 (three) times daily as needed for dry eyes. For allergies   memantine 5 MG tablet Commonly known as: NAMENDA Take 5 mg by mouth 2 (two) times daily.   mirtazapine 7.5 MG tablet Commonly known as: REMERON Take 7.5 mg by mouth at bedtime.   ondansetron 4  MG tablet Commonly known as: ZOFRAN Take 1 tablet (4 mg total) by mouth every 6 (six) hours as needed for nausea.   polyethylene glycol 17 g packet Commonly known as: MIRALAX / GLYCOLAX Take 17 g by mouth daily.   torsemide 20 MG tablet Commonly known as: DEMADEX Take 20 mg by mouth. Once A Day on Mon, Wed, Fri   zinc oxide 20 % ointment Apply 1 application topically as needed for irritation. To buttocks after every incontinent episode and as needed for redness. May keep at bedside       Review of Systems  Constitutional: Negative.   HENT: Positive for congestion, postnasal drip and rhinorrhea.   Respiratory: Negative for cough and shortness of breath.   Cardiovascular: Positive for leg swelling.  Gastrointestinal: Negative.   Genitourinary: Negative.   Musculoskeletal: Positive for arthralgias and gait problem.  Psychiatric/Behavioral: Negative.   All other systems reviewed and are negative.   Immunization History  Administered Date(s) Administered  . Influenza, High Dose Seasonal PF 07/12/2017   Pertinent  Health Maintenance Due  Topic Date Due  . DEXA SCAN  02/20/1990  . PNA vac Low Risk Adult (1 of 2 - PCV13) 02/20/1990  . INFLUENZA VACCINE  05/20/2019   No flowsheet data found. Functional Status Survey:    Vitals:   07/24/19 1103  BP: (!) 122/58  Pulse: 63  Resp: 19  Temp: (!) 97.5 F (36.4 C)  SpO2: 97%  Weight: 127 lb 8 oz (57.8 kg)  Height: 5' (1.524 m)   Body mass index is 24.9 kg/m. Physical Exam Vitals signs reviewed.  Constitutional:      Appearance: Normal appearance.  HENT:     Head: Normocephalic.     Nose: Nose normal.     Mouth/Throat:     Mouth: Mucous membranes are moist.     Pharynx: Oropharynx is clear.  Eyes:     Pupils: Pupils are equal, round, and reactive to light.  Neck:     Musculoskeletal: Neck supple.  Cardiovascular:     Rate and Rhythm: Normal rate and regular rhythm.     Pulses: Normal pulses.     Heart sounds:  Normal heart sounds.  Pulmonary:     Effort:  Pulmonary effort is normal. No respiratory distress.     Breath sounds: Normal breath sounds. No wheezing or rales.  Abdominal:     General: Abdomen is flat. Bowel sounds are normal.     Palpations: Abdomen is soft.  Musculoskeletal:     Comments: Moderate Swelling Bilateral  Skin:    General: Skin is warm and dry.  Neurological:     General: No focal deficit present.     Mental Status: She is alert.  Psychiatric:        Mood and Affect: Mood normal.        Thought Content: Thought content normal.     Labs reviewed: Recent Labs    12/21/18 0019 12/22/18 0304 12/23/18 0207  01/30/19 03/30/19 04/25/19 04/26/19  NA 136 136 132*   < > 137 136* 137  --   K 5.0 4.1 4.2   < > 4.7 4.4 4.2  --   CL 106 103 102  --   --   --   --  102  CO2 23 25 25   --   --   --   --  28  GLUCOSE 140* 137* 110*  --   --   --   --   --   BUN 35* 14 20   < > 25* 25* 48*  --   CREATININE 1.21* 0.91 1.37*   < > 1.2* 1.1 1.1  --   CALCIUM 8.9 8.3* 8.4*  --   --   --   --  9.0  MG  --  1.8  --   --   --   --   --   --    < > = values in this interval not displayed.   Recent Labs    12/22/18 0304 12/23/18 0207 03/30/19  AST 86* 53* 20  ALT 64* 37 11  ALKPHOS 82 69 92  BILITOT 0.6 0.7  --   PROT 5.6* 5.4*  --   ALBUMIN 2.6* 2.3*  --    Recent Labs    12/21/18 0019 12/22/18 0304 12/23/18 0207 01/30/19 02/16/19 02/20/19 03/30/19  WBC 10.4 10.4 10.9* 7.6 10.8  --  7.9  NEUTROABS 7.5 9.4* 7.3  --   --   --   --   HGB 11.3* 9.3* 8.0* 9.5* 10.2*  10.2* 9.8* 9.8*  HCT 35.8* 28.7* 25.4* 28* 31*  31* 29* 30*  MCV 93.2 93.8 93.7  --   --   --   --   PLT 319 234 189 431* 387  387 419* 532*   Lab Results  Component Value Date   TSH 2.600 09/19/2018   No results found for: HGBA1C No results found for: CHOL, HDL, LDLCALC, LDLDIRECT, TRIG, CHOLHDL  Significant Diagnostic Results in last 30 days:  No results found.  Assessment/Plan  Persistent Mild  Left Pleural Effusion On Chest Xray With Chronic Lung Changes Would Not recommend CT as she is asymptomatic and not candidate for aggressive work up. Clinically she is doing very well I have left a message for her son to discuss this  Persistent atrial fibrillation(HCC) - Plan:  Not on any Anticoagulation due to GI bleed On Coreg for rate Control  Essential hypertension, benign -Plan:  BP stable On Low dose of Norvasc Chronic bronchitis Doing well on Symbicort BID to see if it helps her Wheezing  SIADH (syndrome of inappropriate ADH production)(HCC) - Plan:  Sodium is stable on Demeclocycline CKD stage 3 Creat stable  Delirium with Hallucinations  Has been doing well in [place] Was taken off her Ultram   Weight loss - Plan:  on Remeron 7.5 mg QHS and doing well Continue same dose  Has gained weight  Right Hip Arthroplasty Pain seem to be controlled on Tylenol Still dependent for her ADLS and transfer Is working with therapy Depression On Wellbutrin and Remeron She is stable Anemia On iron Labs done in 09/20 were stable  Family/ staff Communication:   Labs/tests ordered:   Total time spent in this patient care encounter was  25_  minutes; greater than 50% of the visit spent counseling patient and staff, reviewing records , Labs and coordinating care for problems addressed at this encounter.

## 2019-08-28 ENCOUNTER — Non-Acute Institutional Stay (SKILLED_NURSING_FACILITY): Payer: Medicare Other | Admitting: Internal Medicine

## 2019-08-28 ENCOUNTER — Encounter: Payer: Self-pay | Admitting: Internal Medicine

## 2019-08-28 DIAGNOSIS — R635 Abnormal weight gain: Secondary | ICD-10-CM | POA: Diagnosis not present

## 2019-08-28 DIAGNOSIS — I5181 Takotsubo syndrome: Secondary | ICD-10-CM | POA: Diagnosis not present

## 2019-08-28 DIAGNOSIS — J42 Unspecified chronic bronchitis: Secondary | ICD-10-CM | POA: Diagnosis not present

## 2019-08-28 DIAGNOSIS — I4819 Other persistent atrial fibrillation: Secondary | ICD-10-CM

## 2019-08-28 NOTE — Progress Notes (Signed)
Location:    [facility] Room Number: 52 Place of Service:  SNF (31) Provider:  [name], MD  [name], MD  Patient Care Team: [name], MD as PCP - General (Internal Medicine) [name], MD as PCP - Cardiology (Cardiology) [name], MD as Consulting Physician (Internal Medicine) Ngetich, Nelda Bucks, NP as Nurse Practitioner (Family Medicine)  Extended Emergency Contact Information Primary Emergency Contact: Tocci,Bob Address: [address]           [place], [place] Phone: [phone] Relation: Son Secondary Emergency Contact: Philmore [place] Phone: [phone] Mobile Phone: [phone] Relation: Daughter  Code Status:  DNR Goals of care: Advanced Directive information Advanced Directives 08/28/2019  Does Patient Have a Medical Advance Directive? Yes  Type of Paramedic of [place];Living will;Out of facility DNR (pink MOST or yellow form)  Does patient want to make changes to medical advance directive? No - Patient declined  Copy of [place] in Chart? Yes - validated most recent copy scanned in chart (See row information)  Would patient like information on creating a medical advance directive? -  Pre-existing out of facility DNR order (yellow form or pink MOST form) Yellow form placed in chart (order not valid for inpatient use);Pink MOST form placed in chart (order not valid for inpatient use)     Chief Complaint  Patient presents with  . Acute Visit    Weight gain    HPI:  Pt is a 85 y.o. female seen today for an acute visit for has gained Weight in past few months and has gained almost 20 lbs since her admission   She has a history of Takotsubo's cardiomyopathy with EF of 55 to [id number], diastolic dysfunction, hypertension, PAF not on any anticoagulation, SIADH, depression She has been in SNF since 3/04 after her Right hip  fracture.She underwent InterMedullary Implant on 03/04.  Was seen today for weight gain Also c/o SOB and Cough. No Fever or Chest apin Cough mostly clear Eating really well.  Past Medical History:  Diagnosis Date  . Amputation of finger of right hand   . Aortic stenosis, mild 03/03/2016   By echo 08/2015  . Arthritis   . Back pain   . Bradycardia 08/28/2015  . CKD (chronic kidney disease) stage 3, GFR 30-59 ml/min   . Closed right hip fracture ([place]) 12/2018  . Depression   . GERD (gastroesophageal reflux disease)   . GI bleed due to NSAIDs   . Hyperlipidemia   . Hypertension   . Macular degeneration   . Maxillary sinusitis   . MVP (mitral valve prolapse) 03/03/2016   Bileaflet MVP with mild MR by echo 2016  . Old MI (myocardial infarction)    NSTEMI secondary to stress MI/Takotsubo CM, minimal nonobstructive ASCAD at cath  . Osteoporosis   . Persistent atrial fibrillation ([place]) 2006   not on anticoagulation due to GI bleed and increased fall risk  . PMR (polymyalgia rheumatica) (HCC)   . SIADH (syndrome of inappropriate ADH production) ([place])   . Takotsubo cardiomyopathy    EF normalized to 70%  . Ventricular tachycardia ([place])    on initial presentation of MI   Past Surgical History:  Procedure Laterality Date  . CARDIAC CATHETERIZATION     normal coronary arteries  . CHOLECYSTECTOMY  11/10/2011   Procedure: LAPAROSCOPIC CHOLECYSTECTOMY WITH INTRAOPERATIVE CHOLANGIOGRAM;  Surgeon: Pedro Earls, MD;  Location: [facility];  Service: General;  Laterality: N/A;  . EYE SURGERY  06/10/11   membrane peel   . INTRAMEDULLARY (IM) NAIL INTERTROCHANTERIC Right 12/21/2018   Procedure: INTRAMEDULLARY (IM) NAIL INTERTROCHANTRIC;  Surgeon: Nicholes Stairs, MD;  Location: [facility];  Service: Orthopedics;  Laterality: Right;  . NASAL SINUS SURGERY Left   . ROTATOR CUFF REPAIR  1998  . UMBILICAL HERNIA REPAIR  11/10/2011   Procedure: HERNIA REPAIR UMBILICAL ADULT;  Surgeon: Pedro Earls, MD;  Location: [facility];  Service: General;  Laterality: N/A;  . WISDOM TOOTH EXTRACTION      Allergies  Allergen Reactions  . Amlodipine Swelling    To feet and ankles   . Avelox [Moxifloxacin Hcl In Nacl] Other (See Comments)    Pt does not remember   . Biaxin [Clarithromycin]   . Ceftin [Cefuroxime Axetil]   . Duragesic Disc Transdermal System [Fentanyl] Other (See Comments)    Reaction unknown  . Morphine And Related Other (See Comments)    "My Sister and daughter can,t take it. I've had it before without problems."  . Parathyroid Hormone (Recomb) Other (See Comments)    Made skin dry  . Teriparatide   . Lidocaine Rash    Allergies as of 08/28/2019      Reactions   Amlodipine Swelling   To feet and ankles    Avelox [moxifloxacin Hcl In Nacl] Other (See Comments)   Pt does not remember   Biaxin [clarithromycin]    Ceftin [cefuroxime Axetil]    Duragesic Disc Transdermal System [fentanyl] Other (See Comments)   Reaction unknown   Morphine And Related Other (See Comments)   "My Sister and daughter can,t take it. I've had it before without problems."   Parathyroid Hormone (recomb) Other (See Comments)   Made skin dry   Teriparatide    Lidocaine Rash      Medication List       Accurate as of August 28, 2019 10:33 AM. If you have any questions, ask your nurse or doctor.        acetaminophen 500 MG tablet Commonly known as: TYLENOL Take 500 mg by mouth every 6 (six) hours as needed for mild pain, fever or headache.   acetaminophen 500 MG tablet Commonly known as: TYLENOL Take 500 mg by mouth daily.   albuterol (2.5 MG/3ML) 0.083% nebulizer solution Commonly known as: PROVENTIL Take 2.5 mg by nebulization every 6 (six) hours as needed for wheezing or shortness of breath.   amLODipine 5 MG tablet Commonly known as: NORVASC TAKE 1 TABLET BY MOUTH EVERY DAY   azelastine 0.1 % nasal spray Commonly known as: ASTELIN Place 2 sprays into both nostrils 2  (two) times daily as needed for allergies.   budesonide-formoterol 80-4.5 MCG/ACT inhaler Commonly known as: SYMBICORT Inhale 2 puffs into the lungs 2 (two) times daily.   buPROPion 150 MG 12 hr tablet Commonly known as: WELLBUTRIN SR Take 1 tablet (150 mg total) by mouth every morning.   carvedilol 12.5 MG tablet Commonly known as: COREG Take 1 tablet (12.5 mg total) by mouth 2 (two) times daily with a meal.   cetirizine 10 MG tablet Commonly known as: ZYRTEC Take 10 mg by mouth daily.   demeclocycline 150 MG tablet Commonly known as: DECLOMYCIN Take 150 mg by mouth 2 (two) times daily.   diclofenac sodium 1 % Gel Commonly known as: VOLTAREN Apply 1 application topically 4 (four) times daily as needed (pain).   famotidine  20 MG tablet Commonly known as: PEPCID Take 20 mg by mouth daily.   feeding supplement (PRO-STAT SUGAR FREE 64) Liqd Take 30 mLs by mouth daily.   Iron 325 (65 Fe) MG Tabs Take 1 tablet by mouth every other day. Give with meals   lactose free nutrition Liqd Take 237 mLs by mouth. Twice a day with meals   Lubricating Eye Drops 0.5-0.9 % ophthalmic solution Generic drug: carboxymethylcellul-glycerin Place 1 drop into both eyes 3 (three) times daily as needed for dry eyes. For allergies   memantine 5 MG tablet Commonly known as: NAMENDA Take 5 mg by mouth 2 (two) times daily.   mirtazapine 7.5 MG tablet Commonly known as: REMERON Take 7.5 mg by mouth at bedtime.   ondansetron 4 MG tablet Commonly known as: ZOFRAN Take 1 tablet (4 mg total) by mouth every 6 (six) hours as needed for nausea.   polyethylene glycol 17 g packet Commonly known as: MIRALAX / GLYCOLAX Take 17 g by mouth daily.   torsemide 20 MG tablet Commonly known as: DEMADEX Take 20 mg by mouth. Once A Day on Mon, Wed, Fri   zinc oxide 20 % ointment Apply 1 application topically as needed for irritation. To buttocks after every incontinent episode and as needed for redness.  May keep at bedside       Review of Systems  Constitutional: Positive for unexpected weight change.  HENT: Positive for postnasal drip and rhinorrhea.   Respiratory: Positive for cough and shortness of breath.   Cardiovascular: Negative.   Gastrointestinal: Negative.   Genitourinary: Negative.   Musculoskeletal: Negative.   Neurological: Positive for weakness.  Psychiatric/Behavioral: Negative.     Immunization History  Administered Date(s) Administered  . Influenza, High Dose Seasonal PF 07/12/2017   Pertinent  Health Maintenance Due  Topic Date Due  . DEXA SCAN  02/20/1990  . PNA vac Low Risk Adult (1 of 2 - PCV13) 02/20/1990  . INFLUENZA VACCINE  05/20/2019   No flowsheet data found. Functional Status Survey:    Vitals:   08/28/19 1023  BP: (!) 127/59  Pulse: 68  Resp: 20  Temp: (!) 97.4 F (36.3 C)  SpO2: 97%  Weight: 132 lb 1.6 oz (59.9 kg)  Height: 5' (1.524 m)   Body mass index is 25.8 kg/m. Physical Exam Vitals signs reviewed.  Constitutional:      Appearance: Normal appearance.  HENT:     Head: Normocephalic.     Nose: Nose normal.     Mouth/Throat:     Mouth: Mucous membranes are moist.     Pharynx: Oropharynx is clear.  Eyes:     Pupils: Pupils are equal, round, and reactive to light.  Neck:     Musculoskeletal: Neck supple.  Cardiovascular:     Rate and Rhythm: Normal rate and regular rhythm.     Pulses: Normal pulses.  Pulmonary:     Effort: Pulmonary effort is normal.     Comments: Bilateral Rales Abdominal:     General: Abdomen is flat. Bowel sounds are normal.     Palpations: Abdomen is soft.  Musculoskeletal:        General: No swelling.  Skin:    General: Skin is warm.  Neurological:     General: No focal deficit present.     Mental Status: She is alert.  Psychiatric:        Mood and Affect: Mood normal.        Thought Content: Thought content normal.  Labs reviewed: Recent Labs    12/21/18 0019 12/22/18 0304  12/23/18 0207  01/30/19 03/30/19 04/25/19 04/26/19  NA 136 136 132*   < > 137 136* 137  --   K 5.0 4.1 4.2   < > 4.7 4.4 4.2  --   CL 106 103 102  --   --   --   --  102  CO2 23 25 25   --   --   --   --  28  GLUCOSE 140* 137* 110*  --   --   --   --   --   BUN 35* 14 20   < > 25* 25* 48*  --   CREATININE 1.21* 0.91 1.37*   < > 1.2* 1.1 1.1  --   CALCIUM 8.9 8.3* 8.4*  --   --   --   --  9.0  MG  --  1.8  --   --   --   --   --   --    < > = values in this interval not displayed.   Recent Labs    12/22/18 0304 12/23/18 0207 03/30/19  AST 86* 53* 20  ALT 64* 37 11  ALKPHOS 82 69 92  BILITOT 0.6 0.7  --   PROT 5.6* 5.4*  --   ALBUMIN 2.6* 2.3*  --    Recent Labs    12/21/18 0019 12/22/18 0304 12/23/18 0207 01/30/19 02/16/19 02/20/19 03/30/19  WBC 10.4 10.4 10.9* 7.6 10.8  --  7.9  NEUTROABS 7.5 9.4* 7.3  --   --   --   --   HGB 11.3* 9.3* 8.0* 9.5* 10.2*  10.2* 9.8* 9.8*  HCT 35.8* 28.7* 25.4* 28* 31*  31* 29* 30*  MCV 93.2 93.8 93.7  --   --   --   --   PLT 319 234 189 431* 387  387 419* 532*   Lab Results  Component Value Date   TSH 2.600 09/19/2018   No results found for: HGBA1C No results found for: CHOL, HDL, LDLCALC, LDLDIRECT, TRIG, CHOLHDL  Significant Diagnostic Results in last 30 days:  No results found.  Assessment/Plan Chronic Bronchitis Will start on Low dose of Prednisone 20 mg Taper over next few days Continue on Symbicort Reval   Weight Gain Looks Euvolemic Will discontinue Remeron Continue to monitor Will not incrase Demadex right now   Other issues  Persistent Mild Left Pleural Effusion On Chest Xray With Chronic Lung Changes Would Not recommend CT as she is asymptomatic and not candidate for aggressive work up. Clinically she is doing very well I have d/w the son and he is Agreable  Persistent atrial fibrillation(HCC) - Plan:  Not on any Anticoagulation due to GI bleed On Coreg for rate Control  Essential hypertension,  benign -Plan:  BP stable On Low dose of Norvasc SIADH (syndrome of inappropriate ADH production)(HCC) - Plan:  Sodium is stable on Demeclocycline CKD stage 3 Creat stable  Deliriumwith Hallucinations Has been doing well in [place] Was taken off her Ultram  Right Hip Arthroplasty Pain seem to be controlled on Tylenol Still dependent for her ADLS and transfer Is working with therapy Depression On Wellbutrin  She is stable Anemia On iron Labs done in 09/20 were stable   Family/ staff Communication:   Labs/tests ordered:  CBC,CMP, TSH

## 2019-09-08 ENCOUNTER — Encounter: Payer: Self-pay | Admitting: Nurse Practitioner

## 2019-09-08 ENCOUNTER — Non-Acute Institutional Stay (SKILLED_NURSING_FACILITY): Payer: Medicare Other | Admitting: Nurse Practitioner

## 2019-09-08 DIAGNOSIS — I5189 Other ill-defined heart diseases: Secondary | ICD-10-CM | POA: Diagnosis not present

## 2019-09-08 DIAGNOSIS — M545 Low back pain: Secondary | ICD-10-CM | POA: Diagnosis not present

## 2019-09-08 DIAGNOSIS — F0280 Dementia in other diseases classified elsewhere without behavioral disturbance: Secondary | ICD-10-CM

## 2019-09-08 DIAGNOSIS — J42 Unspecified chronic bronchitis: Secondary | ICD-10-CM

## 2019-09-08 DIAGNOSIS — R4189 Other symptoms and signs involving cognitive functions and awareness: Secondary | ICD-10-CM | POA: Diagnosis not present

## 2019-09-08 DIAGNOSIS — K5901 Slow transit constipation: Secondary | ICD-10-CM

## 2019-09-08 DIAGNOSIS — F0393 Unspecified dementia, unspecified severity, with mood disturbance: Secondary | ICD-10-CM

## 2019-09-08 DIAGNOSIS — D649 Anemia, unspecified: Secondary | ICD-10-CM

## 2019-09-08 DIAGNOSIS — I10 Essential (primary) hypertension: Secondary | ICD-10-CM

## 2019-09-08 DIAGNOSIS — K219 Gastro-esophageal reflux disease without esophagitis: Secondary | ICD-10-CM

## 2019-09-08 DIAGNOSIS — F329 Major depressive disorder, single episode, unspecified: Secondary | ICD-10-CM

## 2019-09-08 DIAGNOSIS — G8929 Other chronic pain: Secondary | ICD-10-CM

## 2019-09-08 DIAGNOSIS — E222 Syndrome of inappropriate secretion of antidiuretic hormone: Secondary | ICD-10-CM

## 2019-09-08 DIAGNOSIS — I4819 Other persistent atrial fibrillation: Secondary | ICD-10-CM

## 2019-09-08 NOTE — Progress Notes (Signed)
Location:   SNF [place] Room Number: 81 Place of Service:  SNF (31) Provider:  Sonnie Bias NP  [name], MD  Patient Care Team: [name], MD as PCP - General (Internal Medicine) [name], MD as PCP - Cardiology (Cardiology) [name], MD as Consulting Physician (Internal Medicine) Ngetich, Nelda Bucks, NP as Nurse Practitioner (Family Medicine)  Extended Emergency Contact Information Primary Emergency Contact: Bouwens,Bob Address: [address]           [place], [place] Phone: [phone] Relation: Son Secondary Emergency Contact: Philmore [place] Phone: [phone] Mobile Phone: [phone] Relation: Daughter  Code Status:  DNR Goals of care: Advanced Directive information Advanced Directives 08/28/2019  Does Patient Have a Medical Advance Directive? Yes  Type of Paramedic of [place];Living will;Out of facility DNR (pink MOST or yellow form)  Does patient want to make changes to medical advance directive? No - Patient declined  Copy of [place] in Chart? Yes - validated most recent copy scanned in chart (See row information)  Would patient like information on creating a medical advance directive? -  Pre-existing out of facility DNR order (yellow form or pink MOST form) Yellow form placed in chart (order not valid for inpatient use);Pink MOST form placed in chart (order not valid for inpatient use)     Chief Complaint  Patient presents with  . Medical Management of Chronic Issues  . Health Maintenance    PCV13, TDAP, Dexa scan    HPI:  Pt is a 85 y.o. female seen today for medical management of chronic diseases.    The patient resides in SNF [facility] for safety, care assistance, w/c for mobility, on Memantine 5mg  bid for memory. Her mood is stable, on Wellbutrin 150mg  qd. CHF/chronic edema BLE, on Torsemide 20mg  qd. Anemia, last Hgb  9s, on Fe 325mg  qod. Hyponatremia, stable, on Demeclocycline 150mg  bid. Constipation, stable, on MiraLax qd. GERD, stable, on Famotidine 20mg  qd. HTN, blood pressure is controlled on Carvedilol 12.5mg  bid, Amlodipine 5mg  qd. COPD/chronic bronchitis,  stable, on Symbicort II bid, Albuterol Neb q6h prn. Generalized osteoarthritis, stable on Tylenol 500mg  qd, q6h prn. Afib, heart rate is in control.    Past Medical History:  Diagnosis Date  . Amputation of finger of right hand   . Aortic stenosis, mild 03/03/2016   By echo 08/2015  . Arthritis   . Back pain   . Bradycardia 08/28/2015  . CKD (chronic kidney disease) stage 3, GFR 30-59 ml/min   . Closed right hip fracture ([place]) 12/2018  . Depression   . GERD (gastroesophageal reflux disease)   . GI bleed due to NSAIDs   . Hyperlipidemia   . Hypertension   . Macular degeneration   . Maxillary sinusitis   . MVP (mitral valve prolapse) 03/03/2016   Bileaflet MVP with mild MR by echo 2016  . Old MI (myocardial infarction)    NSTEMI secondary to stress MI/Takotsubo CM, minimal nonobstructive ASCAD at cath  . Osteoporosis   . Persistent atrial fibrillation ([place]) 2006   not on anticoagulation due to GI bleed and increased fall risk  . PMR (polymyalgia rheumatica) (HCC)   . SIADH (syndrome of inappropriate ADH production) ([place])   . Takotsubo cardiomyopathy    EF normalized to 70%  . Ventricular tachycardia ([place])    on initial presentation of MI   Past Surgical History:  Procedure Laterality  Date  . CARDIAC CATHETERIZATION     normal coronary arteries  . CHOLECYSTECTOMY  11/10/2011   Procedure: LAPAROSCOPIC CHOLECYSTECTOMY WITH INTRAOPERATIVE CHOLANGIOGRAM;  Surgeon: Pedro Earls, MD;  Location: [facility];  Service: General;  Laterality: N/A;  . EYE SURGERY  06/10/11   membrane peel   . INTRAMEDULLARY (IM) NAIL INTERTROCHANTERIC Right 12/21/2018   Procedure: INTRAMEDULLARY (IM) NAIL INTERTROCHANTRIC;  Surgeon: Nicholes Stairs, MD;   Location: [facility];  Service: Orthopedics;  Laterality: Right;  . NASAL SINUS SURGERY Left   . ROTATOR CUFF REPAIR  1998  . UMBILICAL HERNIA REPAIR  11/10/2011   Procedure: HERNIA REPAIR UMBILICAL ADULT;  Surgeon: Pedro Earls, MD;  Location: [facility];  Service: General;  Laterality: N/A;  . WISDOM TOOTH EXTRACTION      Allergies  Allergen Reactions  . Amlodipine Swelling    To feet and ankles   . Avelox [Moxifloxacin Hcl In Nacl] Other (See Comments)    Pt does not remember   . Biaxin [Clarithromycin]   . Ceftin [Cefuroxime Axetil]   . Duragesic Disc Transdermal System [Fentanyl] Other (See Comments)    Reaction unknown  . Morphine And Related Other (See Comments)    "My Sister and daughter can,t take it. I've had it before without problems."  . Parathyroid Hormone (Recomb) Other (See Comments)    Made skin dry  . Teriparatide   . Lidocaine Rash    Allergies as of 09/08/2019      Reactions   Amlodipine Swelling   To feet and ankles    Avelox [moxifloxacin Hcl In Nacl] Other (See Comments)   Pt does not remember   Biaxin [clarithromycin]    Ceftin [cefuroxime Axetil]    Duragesic Disc Transdermal System [fentanyl] Other (See Comments)   Reaction unknown   Morphine And Related Other (See Comments)   "My Sister and daughter can,t take it. I've had it before without problems."   Parathyroid Hormone (recomb) Other (See Comments)   Made skin dry   Teriparatide    Lidocaine Rash      Medication List       Accurate as of September 08, 2019 11:59 PM. If you have any questions, ask your nurse or doctor.        STOP taking these medications   mirtazapine 7.5 MG tablet Commonly known as: REMERON Stopped by: Rosendo Couser X Alyene Predmore, NP     TAKE these medications   acetaminophen 500 MG tablet Commonly known as: TYLENOL Take 500 mg by mouth every 6 (six) hours as needed for mild pain, fever or headache.   acetaminophen 500 MG tablet Commonly known as: TYLENOL Take 500 mg by mouth  daily.   albuterol (2.5 MG/3ML) 0.083% nebulizer solution Commonly known as: PROVENTIL Take 2.5 mg by nebulization every 6 (six) hours as needed for wheezing or shortness of breath.   amLODipine 5 MG tablet Commonly known as: NORVASC TAKE 1 TABLET BY MOUTH EVERY DAY   azelastine 0.1 % nasal spray Commonly known as: ASTELIN Place 2 sprays into both nostrils 2 (two) times daily as needed for allergies.   budesonide-formoterol 80-4.5 MCG/ACT inhaler Commonly known as: SYMBICORT Inhale 2 puffs into the lungs 2 (two) times daily.   buPROPion 150 MG 12 hr tablet Commonly known as: WELLBUTRIN SR Take 1 tablet (150 mg total) by mouth every morning.   carvedilol 12.5 MG tablet Commonly known as: COREG Take 1 tablet (12.5 mg total) by mouth 2 (two) times daily with a  meal.   cetirizine 10 MG tablet Commonly known as: ZYRTEC Take 10 mg by mouth daily.   demeclocycline 150 MG tablet Commonly known as: DECLOMYCIN Take 150 mg by mouth 2 (two) times daily.   diclofenac sodium 1 % Gel Commonly known as: VOLTAREN Apply 1 application topically 4 (four) times daily as needed (pain).   famotidine 20 MG tablet Commonly known as: PEPCID Take 20 mg by mouth daily.   feeding supplement (PRO-STAT SUGAR FREE 64) Liqd Take 30 mLs by mouth daily.   Iron 325 (65 Fe) MG Tabs Take 1 tablet by mouth every other day. Give with meals   lactose free nutrition Liqd Take 237 mLs by mouth. Twice a day with meals   Lubricating Eye Drops 0.5-0.9 % ophthalmic solution Generic drug: carboxymethylcellul-glycerin Place 1 drop into both eyes 3 (three) times daily as needed for dry eyes. For allergies   memantine 5 MG tablet Commonly known as: NAMENDA Take 5 mg by mouth 2 (two) times daily.   ondansetron 4 MG tablet Commonly known as: ZOFRAN Take 1 tablet (4 mg total) by mouth every 6 (six) hours as needed for nausea.   polyethylene glycol 17 g packet Commonly known as: MIRALAX / GLYCOLAX Take 17  g by mouth daily.   torsemide 20 MG tablet Commonly known as: DEMADEX Take 20 mg by mouth. Once A Day on Mon, Wed, Fri   zinc oxide 20 % ointment Apply 1 application topically as needed for irritation. To buttocks after every incontinent episode and as needed for redness. May keep at bedside      ROS was provided with assistance of staff.  Review of Systems  Constitutional: Negative for activity change, appetite change, chills, diaphoresis, fatigue, fever and unexpected weight change.  HENT: Positive for hearing loss. Negative for congestion and voice change.   Respiratory: Positive for cough and shortness of breath. Negative for wheezing.        Chronic cough, DOE  Cardiovascular: Positive for leg swelling. Negative for chest pain and palpitations.  Gastrointestinal: Negative for abdominal distention, abdominal pain, constipation, diarrhea, nausea and vomiting.  Genitourinary: Negative for difficulty urinating, dysuria and urgency.  Musculoskeletal: Positive for arthralgias, back pain and gait problem.       Right knee pain, lower back pain  Neurological: Negative for dizziness, speech difficulty, weakness and headaches.       Dementia  Psychiatric/Behavioral: Negative for agitation, behavioral problems, hallucinations and sleep disturbance. The patient is not nervous/anxious.     Immunization History  Administered Date(s) Administered  . Influenza, High Dose Seasonal PF 07/12/2017, 08/03/2019   Pertinent  Health Maintenance Due  Topic Date Due  . DEXA SCAN  02/20/1990  . PNA vac Low Risk Adult (1 of 2 - PCV13) 02/20/1990  . INFLUENZA VACCINE  Completed   No flowsheet data found. Functional Status Survey:    Vitals:   09/08/19 1154  BP: (!) 158/78  Pulse: 66  Resp: 20  Temp: 97.8 F (36.6 C)  SpO2: 93%  Weight: 127 lb 12.8 oz (58 kg)  Height: 5' (1.524 m)   Body mass index is 24.96 kg/m. Physical Exam Vitals signs and nursing note reviewed.  Constitutional:       General: She is not in acute distress.    Appearance: Normal appearance. She is normal weight. She is not ill-appearing, toxic-appearing or diaphoretic.  HENT:     Head: Normocephalic and atraumatic.     Nose: Nose normal.     Mouth/Throat:  Mouth: Mucous membranes are moist.  Eyes:     Extraocular Movements: Extraocular movements intact.     Conjunctiva/sclera: Conjunctivae normal.     Pupils: Pupils are equal, round, and reactive to light.  Neck:     Musculoskeletal: Normal range of motion and neck supple.  Cardiovascular:     Rate and Rhythm: Normal rate and regular rhythm.  Pulmonary:     Breath sounds: Rales present. No wheezing or rhonchi.     Comments: Bibasilar rales. Abdominal:     General: Bowel sounds are normal. There is no distension.     Palpations: Abdomen is soft.     Tenderness: There is no abdominal tenderness. There is no right CVA tenderness, left CVA tenderness, guarding or rebound.  Musculoskeletal:     Right lower leg: Edema present.     Left lower leg: Edema present.     Comments: Trace edema BLEm L>R  Skin:    General: Skin is warm and dry.  Neurological:     General: No focal deficit present.     Mental Status: She is alert. Mental status is at baseline.     Motor: No weakness.     Coordination: Coordination normal.     Gait: Gait abnormal.     Comments: Oriented to person, her room on unit  Psychiatric:        Mood and Affect: Mood normal.        Behavior: Behavior normal.     Labs reviewed: Recent Labs    12/21/18 0019 12/22/18 0304 12/23/18 0207  01/30/19 03/30/19 04/25/19 04/26/19  NA 136 136 132*   < > 137 136* 137  --   K 5.0 4.1 4.2   < > 4.7 4.4 4.2  --   CL 106 103 102  --   --   --   --  102  CO2 23 25 25   --   --   --   --  28  GLUCOSE 140* 137* 110*  --   --   --   --   --   BUN 35* 14 20   < > 25* 25* 48*  --   CREATININE 1.21* 0.91 1.37*   < > 1.2* 1.1 1.1  --   CALCIUM 8.9 8.3* 8.4*  --   --   --   --  9.0  MG  --   1.8  --   --   --   --   --   --    < > = values in this interval not displayed.   Recent Labs    12/22/18 0304 12/23/18 0207 03/30/19  AST 86* 53* 20  ALT 64* 37 11  ALKPHOS 82 69 92  BILITOT 0.6 0.7  --   PROT 5.6* 5.4*  --   ALBUMIN 2.6* 2.3*  --    Recent Labs    12/21/18 0019 12/22/18 0304 12/23/18 0207 01/30/19 02/16/19 02/20/19 03/30/19  WBC 10.4 10.4 10.9* 7.6 10.8  --  7.9  NEUTROABS 7.5 9.4* 7.3  --   --   --   --   HGB 11.3* 9.3* 8.0* 9.5* 10.2*  10.2* 9.8* 9.8*  HCT 35.8* 28.7* 25.4* 28* 31*  31* 29* 30*  MCV 93.2 93.8 93.7  --   --   --   --   PLT 319 234 189 431* 387  387 419* 532*   Lab Results  Component Value Date   TSH 2.600 09/19/2018  No results found for: HGBA1C No results found for: CHOL, HDL, LDLCALC, LDLDIRECT, TRIG, CHOLHDL  Significant Diagnostic Results in last 30 days:  No results found.  Assessment/Plan Anemia Baseline Hgb 9s, no active bleed, continue Fe 325mg  qod.   Back pain Controlled back pain, continue Tylenol 500mg  qd, q6h prn, w/c for mobility.   Cognitive impairment Continue SNF [facility] for safety and care assistance, w/c for mobility, continue Memantine 5mg  bid for memory. The patient is age of 67, w/c for mobility, will delay DEXA  Diastolic dysfunction Compensated, chronic trace edema BLE, continue Torsemide 20mg  qd.   Depression due to dementia [facility]) Her mood is stable, continue Wellbutrin 150mg  qd.   SIADH (syndrome of inappropriate ADH production) (HCC) Stable, continue Demeclocycline 150mg  bid.   Esophageal reflux Stable, continue Famotidine 20mg  qd.   Chronic bronchitis (HCC) Stable, continue Symbicort II bid, Albuterol Neb q6h prn.  Essential hypertension, benign blood pressure is controlled, continue  Carvedilol 12.5mg  bid, Amlodipine 5mg  qd.   Persistent atrial fibrillation (HCC) Heart rate is in control.   Slow transit constipation Stable, continue MiraLax qd.      Family/ staff Communication:  plan of care reviewed with the patient and charge nurse.   Labs/tests ordered:  None  Time spend 25 minutes.

## 2019-09-10 ENCOUNTER — Encounter: Payer: Self-pay | Admitting: Nurse Practitioner

## 2019-09-10 DIAGNOSIS — K5901 Slow transit constipation: Secondary | ICD-10-CM | POA: Insufficient documentation

## 2019-09-10 NOTE — Assessment & Plan Note (Signed)
Stable, continue MiraLax qd.  

## 2019-09-10 NOTE — Assessment & Plan Note (Signed)
blood pressure is controlled, continue Carvedilol 12.5mg bid, Amlodipine 5mg qd. 

## 2019-09-10 NOTE — Assessment & Plan Note (Signed)
Controlled back pain, continue Tylenol 500mg  qd, q6h prn, w/c for mobility.

## 2019-09-10 NOTE — Assessment & Plan Note (Signed)
Baseline Hgb 9s, no active bleed, continue Fe 325mg  qod.

## 2019-09-10 NOTE — Assessment & Plan Note (Signed)
Her mood is stable, continue Wellbutrin 150mg qd.  

## 2019-09-10 NOTE — Assessment & Plan Note (Signed)
Stable, continue Demeclocycline 150mg  bid.

## 2019-09-10 NOTE — Assessment & Plan Note (Signed)
Compensated, chronic trace edema BLE, continue Torsemide 20mg  qd.

## 2019-09-10 NOTE — Assessment & Plan Note (Signed)
Heart rate is in control.  

## 2019-09-10 NOTE — Assessment & Plan Note (Addendum)
Continue SNF [facility] for safety and care assistance, w/c for mobility, continue Memantine 5mg  bid for memory. The patient is age of 69, w/c for mobility, will delay DEXA

## 2019-09-10 NOTE — Assessment & Plan Note (Signed)
Stable, continue Famotidine 20mg qd.  

## 2019-09-10 NOTE — Assessment & Plan Note (Signed)
Stable, continue Symbicort II bid, Albuterol Neb q6h prn.

## 2019-10-02 ENCOUNTER — Non-Acute Institutional Stay (SKILLED_NURSING_FACILITY): Payer: Medicare Other | Admitting: Internal Medicine

## 2019-10-02 ENCOUNTER — Encounter: Payer: Self-pay | Admitting: Internal Medicine

## 2019-10-02 DIAGNOSIS — I10 Essential (primary) hypertension: Secondary | ICD-10-CM | POA: Diagnosis not present

## 2019-10-02 DIAGNOSIS — J42 Unspecified chronic bronchitis: Secondary | ICD-10-CM

## 2019-10-02 DIAGNOSIS — E222 Syndrome of inappropriate secretion of antidiuretic hormone: Secondary | ICD-10-CM | POA: Diagnosis not present

## 2019-10-02 DIAGNOSIS — I5181 Takotsubo syndrome: Secondary | ICD-10-CM

## 2019-10-02 DIAGNOSIS — I4819 Other persistent atrial fibrillation: Secondary | ICD-10-CM

## 2019-10-02 NOTE — Progress Notes (Signed)
Location:   [facility] Room Number: 32 Place of Service:  SNF [phone]) Provider: [name], MD   [name], MD  Patient Care Team: [name], MD as PCP - General (Internal Medicine) [name], MD as PCP - Cardiology (Cardiology) [name], MD as Consulting Physician (Internal Medicine) Ngetich, Nelda Bucks, NP as Nurse Practitioner (Family Medicine)  Extended Emergency Contact Information Primary Emergency Contact: Madarang,Bob Address: [address]           [place], [place] Phone: [phone] Relation: Son Secondary Emergency Contact: Philmore [place] Phone: [phone] Mobile Phone: [phone] Relation: Daughter  Code Status:  DNR Goals of care: Advanced Directive information Advanced Directives 08/28/2019  Does Patient Have a Medical Advance Directive? Yes  Type of Paramedic of [place];Living will;Out of facility DNR (pink MOST or yellow form)  Does patient want to make changes to medical advance directive? No - Patient declined  Copy of [place] in Chart? Yes - validated most recent copy scanned in chart (See row information)  Would patient like information on creating a medical advance directive? -  Pre-existing out of facility DNR order (yellow form or pink MOST form) Yellow form placed in chart (order not valid for inpatient use);Pink MOST form placed in chart (order not valid for inpatient use)     Chief Complaint  Patient presents with  . Medical Management of Chronic Issues  . Health Maintenance    TDAP, Dexa scan. PCV13    HPI:  Pt is a 85 y.o. female seen today for medical management of chronic diseases.    She has a history of Takotsubo's cardiomyopathy with EF of 55 to 123456, diastolic dysfunction, hypertension, PAF not on any anticoagulation, SIADH, depression She has been in SNF since 3/04 after her  Right hip fracture.She underwent InterMedullary Implant on 03/04. Doing well in facility. Has gained weight Appetite is good. Sleeping well. Only complain if Post Nasal Rhinorrhea She is walking with a walker but with mod assist with therapy Past Medical History:  Diagnosis Date  . Amputation of finger of right hand   . Aortic stenosis, mild 03/03/2016   By echo 08/2015  . Arthritis   . Back pain   . Bradycardia 08/28/2015  . CKD (chronic kidney disease) stage 3, GFR 30-59 ml/min   . Closed right hip fracture ([place]) 12/2018  . Depression   . GERD (gastroesophageal reflux disease)   . GI bleed due to NSAIDs   . Hyperlipidemia   . Hypertension   . Macular degeneration   . Maxillary sinusitis   . MVP (mitral valve prolapse) 03/03/2016   Bileaflet MVP with mild MR by echo 2016  . Old MI (myocardial infarction)    NSTEMI secondary to stress MI/Takotsubo CM, minimal nonobstructive ASCAD at cath  . Osteoporosis   . Persistent atrial fibrillation ([place]) 2006   not on anticoagulation due to GI bleed and increased fall risk  . PMR (polymyalgia rheumatica) (HCC)   . SIADH (syndrome of inappropriate ADH production) ([place])   . Takotsubo cardiomyopathy    EF normalized to 70%  . Ventricular tachycardia ([place])    on initial presentation of MI   Past Surgical History:  Procedure Laterality Date  . CARDIAC CATHETERIZATION     normal coronary arteries  . CHOLECYSTECTOMY  11/10/2011   Procedure: LAPAROSCOPIC CHOLECYSTECTOMY WITH INTRAOPERATIVE CHOLANGIOGRAM;  Surgeon: Isabel Caprice  Hassell Done, MD;  Location: [facility];  Service: General;  Laterality: N/A;  . EYE SURGERY  06/10/11   membrane peel   . INTRAMEDULLARY (IM) NAIL INTERTROCHANTERIC Right 12/21/2018   Procedure: INTRAMEDULLARY (IM) NAIL INTERTROCHANTRIC;  Surgeon: Nicholes Stairs, MD;  Location: [facility];  Service: Orthopedics;  Laterality: Right;  . NASAL SINUS SURGERY Left   . ROTATOR CUFF REPAIR  1998  . UMBILICAL HERNIA REPAIR  11/10/2011    Procedure: HERNIA REPAIR UMBILICAL ADULT;  Surgeon: Pedro Earls, MD;  [facility];  Service: General;  Laterality: N/A;  . WISDOM TOOTH EXTRACTION      Allergies  Allergen Reactions  . Amlodipine Swelling    To feet and ankles   . Avelox [Moxifloxacin Hcl In Nacl] Other (See Comments)    Pt does not remember   . Biaxin [Clarithromycin]   . Ceftin [Cefuroxime Axetil]   . Duragesic Disc Transdermal System [Fentanyl] Other (See Comments)    Reaction unknown  . Morphine And Related Other (See Comments)    "My Sister and daughter can,t take it. I've had it before without problems."  . Parathyroid Hormone (Recomb) Other (See Comments)    Made skin dry  . Teriparatide   . Lidocaine Rash    Allergies as of 10/02/2019      Reactions   Amlodipine Swelling   To feet and ankles    Avelox [moxifloxacin Hcl In Nacl] Other (See Comments)   Pt does not remember   Biaxin [clarithromycin]    Ceftin [cefuroxime Axetil]    Duragesic Disc Transdermal System [fentanyl] Other (See Comments)   Reaction unknown   Morphine And Related Other (See Comments)   "My Sister and daughter can,t take it. I've had it before without problems."   Parathyroid Hormone (recomb) Other (See Comments)   Made skin dry   Teriparatide    Lidocaine Rash      Medication List       Accurate as of October 02, 2019 11:14 AM. If you have any questions, ask your nurse or doctor.        acetaminophen 500 MG tablet Commonly known as: TYLENOL Take 500 mg by mouth every 6 (six) hours as needed for mild pain, fever or headache.   acetaminophen 500 MG tablet Commonly known as: TYLENOL Take 500 mg by mouth daily.   albuterol (2.5 MG/3ML) 0.083% nebulizer solution Commonly known as: PROVENTIL Take 2.5 mg by nebulization every 6 (six) hours as needed for wheezing or shortness of breath.   amLODipine 5 MG tablet Commonly known as: NORVASC TAKE 1 TABLET BY MOUTH EVERY DAY   azelastine 0.1 % nasal  spray Commonly known as: ASTELIN Place 2 sprays into both nostrils 2 (two) times daily as needed for allergies.   budesonide-formoterol 80-4.5 MCG/ACT inhaler Commonly known as: SYMBICORT Inhale 2 puffs into the lungs 2 (two) times daily.   buPROPion 150 MG 12 hr tablet Commonly known as: WELLBUTRIN SR Take 1 tablet (150 mg total) by mouth every morning.   carvedilol 12.5 MG tablet Commonly known as: COREG Take 1 tablet (12.5 mg total) by mouth 2 (two) times daily with a meal.   cetirizine 10 MG tablet Commonly known as: ZYRTEC Take 10 mg by mouth daily.   demeclocycline 150 MG tablet Commonly known as: DECLOMYCIN Take 150 mg by mouth 2 (two) times daily.   diclofenac sodium 1 % Gel Commonly known as: VOLTAREN Apply 1 application topically 4 (four) times daily as needed (pain).  famotidine 20 MG tablet Commonly known as: PEPCID Take 20 mg by mouth daily.   feeding supplement (PRO-STAT SUGAR FREE 64) Liqd Take 30 mLs by mouth daily.   Iron 325 (65 Fe) MG Tabs Take 1 tablet by mouth every other day. Give with meals   lactose free nutrition Liqd Take 237 mLs by mouth. Twice a day with meals   Lubricating Eye Drops 0.5-0.9 % ophthalmic solution Generic drug: carboxymethylcellul-glycerin Place 1 drop into both eyes 3 (three) times daily as needed for dry eyes. For allergies   memantine 5 MG tablet Commonly known as: NAMENDA Take 5 mg by mouth 2 (two) times daily.   ondansetron 4 MG tablet Commonly known as: ZOFRAN Take 1 tablet (4 mg total) by mouth every 6 (six) hours as needed for nausea.   polyethylene glycol 17 g packet Commonly known as: MIRALAX / GLYCOLAX Take 17 g by mouth daily.   torsemide 20 MG tablet Commonly known as: DEMADEX Take 20 mg by mouth. Once A Day on Mon, Wed, Fri   zinc oxide 20 % ointment Apply 1 application topically as needed for irritation. To buttocks after every incontinent episode and as needed for redness. May keep at bedside        Review of Systems  HENT: Positive for congestion, postnasal drip and rhinorrhea.   Respiratory: Negative.   Cardiovascular: Positive for leg swelling.  Gastrointestinal: Negative.   Genitourinary: Negative.   Musculoskeletal: Negative.   Skin: Negative.   Neurological: Negative.   Psychiatric/Behavioral: Negative.   All other systems reviewed and are negative.   Immunization History  Administered Date(s) Administered  . Influenza, High Dose Seasonal PF 07/12/2017, 08/03/2019   Pertinent  Health Maintenance Due  Topic Date Due  . DEXA SCAN  02/20/1990  . PNA vac Low Risk Adult (1 of 2 - PCV13) 02/20/1990  . INFLUENZA VACCINE  Completed   No flowsheet data found. Functional Status Survey:    Vitals:   10/02/19 0932  BP: (!) 149/64  Pulse: 68  Resp: 20  Temp: 98.4 F (36.9 C)  SpO2: 96%  Weight: 128 lb 9.6 oz (58.3 kg)  Height: 5' (1.524 m)   Body mass index is 25.12 kg/m. Physical Exam Vitals reviewed.  Constitutional:      Appearance: Normal appearance.  HENT:     Head: Normocephalic.     Nose: Nose normal.     Mouth/Throat:     Mouth: Mucous membranes are moist.     Pharynx: Oropharynx is clear.  Eyes:     Pupils: Pupils are equal, round, and reactive to light.  Cardiovascular:     Rate and Rhythm: Normal rate and regular rhythm.     Pulses: Normal pulses.  Pulmonary:     Effort: Pulmonary effort is normal.     Comments: Has Rales Bilateral Abdominal:     General: Abdomen is flat.     Palpations: Abdomen is soft.  Musculoskeletal:     Cervical back: Neck supple.     Comments: Has Mild Swelling Bilateral Left More then Right  Skin:    General: Skin is warm.  Neurological:     General: No focal deficit present.     Mental Status: She is alert and oriented to person, place, and time.  Psychiatric:        Mood and Affect: Mood normal.        Thought Content: Thought content normal.     Labs reviewed: Recent Labs  12/21/18 0019  12/22/18 0304 12/22/18 0304 12/23/18 0207 01/30/19 0000 03/30/19 0000 04/25/19 0000 04/26/19 0000  NA 136 136   < > 132* 137 136* 137  --   K 5.0 4.1  --  4.2 4.7 4.4 4.2  --   CL 106 103  --  102  --   --   --  102  CO2 23 25  --  25  --   --   --  28  GLUCOSE 140* 137*  --  110*  --   --   --   --   BUN 35* 14   < > 20 25* 25* 48*  --   CREATININE 1.21* 0.91   < > 1.37* 1.2* 1.1 1.1  --   CALCIUM 8.9 8.3*  --  8.4*  --   --   --  9.0  MG  --  1.8  --   --   --   --   --   --    < > = values in this interval not displayed.   Recent Labs    12/22/18 0304 12/23/18 0207 03/30/19 0000  AST 86* 53* 20  ALT 64* 37 11  ALKPHOS 82 69 92  BILITOT 0.6 0.7  --   PROT 5.6* 5.4*  --   ALBUMIN 2.6* 2.3*  --    Recent Labs    12/21/18 0019 12/22/18 0304 12/23/18 0207 01/30/19 0000 02/16/19 0000 02/20/19 0000 03/30/19 0000  WBC 10.4 10.4 10.9* 7.6 10.8  --  7.9  NEUTROABS 7.5 9.4* 7.3  --   --   --   --   HGB 11.3* 9.3* 8.0* 9.5* 10.2*  10.2* 9.8* 9.8*  HCT 35.8* 28.7* 25.4* 28* 31*  31* 29* 30*  MCV 93.2 93.8 93.7  --   --   --   --   PLT 319 234 189 431* 387  387 419* 532*   Lab Results  Component Value Date   TSH 2.600 09/19/2018   No results found for: HGBA1C No results found for: CHOL, HDL, LDLCALC, LDLDIRECT, TRIG, CHOLHDL  Significant Diagnostic Results in last 30 days:  No results found.  Assessment/Plan  Takotsubo cardiomyopathy- Plan:  Continue on Torsemide  Persistent Left Pleural Effusion Have d/w Son her POA. No More work up right now Patient stable Persistent atrial fibrillation(HCC) - Plan:  On Coreg for Rate Control  Essential hypertension, benign -Plan:  Continue Norvasc Post Nasal Drip On Azelastine Will start on Flonase for few weeks  Chronic bronchitis Continue on Symbicort  SIADH (syndrome of inappropriate ADH production)(HCC) - Plan:  Sodium is stable on Demeclocycline   Cognitive Impairnement Doing well with  Namenda  Right Hip Arthroplasty Pain seem to be controlled on Tylenol Still dependent for her ADLS and transfer  Depression On Wellbutrin  She is stable Anemia On iron CKD Creat Stable  Family/ staff Communication:   Labs/tests ordered:    Total time spent in this patient care encounter was  25_  minutes; greater than 50% of the visit spent counseling patient and staff, reviewing records , Labs and coordinating care for problems addressed at this encounter.

## 2019-11-09 ENCOUNTER — Non-Acute Institutional Stay: Payer: Medicare PPO | Admitting: Internal Medicine

## 2019-11-09 DIAGNOSIS — M25551 Pain in right hip: Secondary | ICD-10-CM

## 2019-11-09 DIAGNOSIS — E222 Syndrome of inappropriate secretion of antidiuretic hormone: Secondary | ICD-10-CM

## 2019-11-09 DIAGNOSIS — I4819 Other persistent atrial fibrillation: Secondary | ICD-10-CM

## 2019-11-09 DIAGNOSIS — I5181 Takotsubo syndrome: Secondary | ICD-10-CM | POA: Diagnosis not present

## 2019-11-09 DIAGNOSIS — D649 Anemia, unspecified: Secondary | ICD-10-CM

## 2019-11-09 DIAGNOSIS — J309 Allergic rhinitis, unspecified: Secondary | ICD-10-CM

## 2019-11-09 NOTE — Progress Notes (Signed)
Location: [facility] of Service:  ALF (13)  Provider:   Code Status:  Goals of Care:  Advanced Directives 08/28/2019  Does Patient Have a Medical Advance Directive? Yes  Type of Paramedic of [place];Living will;Out of facility DNR (pink MOST or yellow form)  Does patient want to make changes to medical advance directive? No - Patient declined  Copy of [place] in Chart? Yes - validated most recent copy scanned in chart (See row information)  Would patient like information on creating a medical advance directive? -  Pre-existing out of facility DNR order (yellow form or pink MOST form) Yellow form placed in chart (order not valid for inpatient use);Pink MOST form placed in chart (order not valid for inpatient use)     Chief Complaint  Patient presents with  . Acute Visit    HPI: Patient is a 86 y.o. female seen today for an acute visit for Right Hip Pain  She has a history of Takotsubo's cardiomyopathy with EF of 55 to 123456, diastolic dysfunction, hypertension, PAF not on any anticoagulation, SIADH, depression She has been in SNF since 3/04 after her Right hip fracture.She underwent InterMedullary Implant on 03/04. Patient was recently admitted into AL after staying in SNF for almost a year. She is now independent in her ADLs. Since she has been here she has been complaining of some right hip pain coming over to her groin area.  She is able to stand do her transfers and use her Wheelchair for her ADLS She states the pain bothers more at night. Overall she is doing well and has adjusted to th AL She denies any falls recently. Her appetite is good and her weight is stable.  No other nursing issues  Past Medical History:  Diagnosis Date  . Amputation of finger of right hand   . Aortic stenosis, mild 03/03/2016   By echo 08/2015  . Arthritis   . Back pain   . Bradycardia 08/28/2015  . CKD (chronic kidney disease)  stage 3, GFR 30-59 ml/min   . Closed right hip fracture ([place]) 12/2018  . Depression   . GERD (gastroesophageal reflux disease)   . GI bleed due to NSAIDs   . Hyperlipidemia   . Hypertension   . Macular degeneration   . Maxillary sinusitis   . MVP (mitral valve prolapse) 03/03/2016   Bileaflet MVP with mild MR by echo 2016  . Old MI (myocardial infarction)    NSTEMI secondary to stress MI/Takotsubo CM, minimal nonobstructive ASCAD at cath  . Osteoporosis   . Persistent atrial fibrillation ([place]) 2006   not on anticoagulation due to GI bleed and increased fall risk  . PMR (polymyalgia rheumatica) (HCC)   . SIADH (syndrome of inappropriate ADH production) ([place])   . Takotsubo cardiomyopathy    EF normalized to 70%  . Ventricular tachycardia ([place])    on initial presentation of MI    Past Surgical History:  Procedure Laterality Date  . CARDIAC CATHETERIZATION     normal coronary arteries  . CHOLECYSTECTOMY  11/10/2011   Procedure: LAPAROSCOPIC CHOLECYSTECTOMY WITH INTRAOPERATIVE CHOLANGIOGRAM;  Surgeon: Pedro Earls, MD;  [facility];  Service: General;  Laterality: N/A;  . EYE SURGERY  06/10/11   membrane peel   . INTRAMEDULLARY (IM) NAIL INTERTROCHANTERIC Right 12/21/2018   Procedure: INTRAMEDULLARY (IM) NAIL INTERTROCHANTRIC;  Surgeon: Nicholes Stairs, MD;  Location: [facility];  Service: Orthopedics;  Laterality: Right;  .  NASAL SINUS SURGERY Left   . ROTATOR CUFF REPAIR  1998  . UMBILICAL HERNIA REPAIR  11/10/2011   Procedure: HERNIA REPAIR UMBILICAL ADULT;  Surgeon: Pedro Earls, MD;  [facility];  Service: General;  Laterality: N/A;  . WISDOM TOOTH EXTRACTION      Allergies  Allergen Reactions  . Amlodipine Swelling    To feet and ankles   . Avelox [Moxifloxacin Hcl In Nacl] Other (See Comments)    Pt does not remember   . Biaxin [Clarithromycin]   . Ceftin [Cefuroxime Axetil]   . Duragesic Disc Transdermal System [Fentanyl] Other (See Comments)     Reaction unknown  . Morphine And Related Other (See Comments)    "My Sister and daughter can,t take it. I've had it before without problems."  . Parathyroid Hormone (Recomb) Other (See Comments)    Made skin dry  . Teriparatide   . Lidocaine Rash    Outpatient Encounter Medications as of 11/09/2019  Medication Sig  . acetaminophen (TYLENOL) 500 MG tablet Take 500 mg by mouth every 6 (six) hours as needed for mild pain, fever or headache.  Marland Kitchen acetaminophen (TYLENOL) 500 MG tablet Take 500 mg by mouth daily.  Marland Kitchen albuterol (PROVENTIL) (2.5 MG/3ML) 0.083% nebulizer solution Take 2.5 mg by nebulization every 6 (six) hours as needed for wheezing or shortness of breath.  . Amino Acids-Protein Hydrolys (FEEDING SUPPLEMENT, PRO-STAT SUGAR FREE 64,) LIQD Take 30 mLs by mouth daily.  Marland Kitchen amLODipine (NORVASC) 5 MG tablet TAKE 1 TABLET BY MOUTH EVERY DAY  . azelastine (ASTELIN) 0.1 % nasal spray Place 2 sprays into both nostrils 2 (two) times daily as needed for allergies.   . budesonide-formoterol (SYMBICORT) 80-4.5 MCG/ACT inhaler Inhale 2 puffs into the lungs 2 (two) times daily.   Marland Kitchen buPROPion (WELLBUTRIN SR) 150 MG 12 hr tablet Take 1 tablet (150 mg total) by mouth every morning.  . carboxymethylcellul-glycerin (LUBRICATING EYE DROPS) 0.5-0.9 % ophthalmic solution Place 1 drop into both eyes 3 (three) times daily as needed for dry eyes. For allergies  . carvedilol (COREG) 12.5 MG tablet Take 1 tablet (12.5 mg total) by mouth 2 (two) times daily with a meal.  . cetirizine (ZYRTEC) 10 MG tablet Take 10 mg by mouth daily.   Marland Kitchen demeclocycline (DECLOMYCIN) 150 MG tablet Take 150 mg by mouth 2 (two) times daily.   . diclofenac sodium (VOLTAREN) 1 % GEL Apply 1 application topically 4 (four) times daily as needed (pain).   . famotidine (PEPCID) 20 MG tablet Take 20 mg by mouth daily.  . Ferrous Sulfate (IRON) 325 (65 Fe) MG TABS Take 1 tablet by mouth every other day. Give with meals  . lactose free nutrition  (BOOST) LIQD Take 237 mLs by mouth. Twice a day with meals  . memantine (NAMENDA) 5 MG tablet Take 5 mg by mouth 2 (two) times daily.   . ondansetron (ZOFRAN) 4 MG tablet Take 1 tablet (4 mg total) by mouth every 6 (six) hours as needed for nausea.  . polyethylene glycol (MIRALAX / GLYCOLAX) packet Take 17 g by mouth daily.   Marland Kitchen torsemide (DEMADEX) 20 MG tablet Take 20 mg by mouth. Once A Day on Mon, Wed, Fri  . zinc oxide 20 % ointment Apply 1 application topically as needed for irritation. To buttocks after every incontinent episode and as needed for redness. May keep at bedside   No facility-administered encounter medications on file as of 11/09/2019.    Review of Systems:  Review of Systems  Review of Systems  Constitutional: Negative for activity change, appetite change, chills, diaphoresis, fatigue and fever.  HENT: Negative for mouth sores, postnasal drip, rhinorrhea, sinus pain and sore throat.   Respiratory: Negative for apnea, cough, chest tightness, shortness of breath and wheezing.   Cardiovascular: Negative for chest pain, palpitations and leg swelling.  Gastrointestinal: Negative for abdominal distention, abdominal pain, constipation, diarrhea, nausea and vomiting.  Genitourinary: Negative for dysuria and frequency.  Musculoskeletal: Negative for arthralgias, joint swelling and myalgias.  Skin: Negative for rash.  Neurological: Negative for dizziness, syncope, weakness, light-headedness and numbness.  Psychiatric/Behavioral: Negative for behavioral problems, confusion and sleep disturbance.     Health Maintenance  Topic Date Due  . TETANUS/TDAP  02/21/1942  . DEXA SCAN  02/20/1990  . PNA vac Low Risk Adult (1 of 2 - PCV13) 02/20/1990  . INFLUENZA VACCINE  Completed    Physical Exam: Vitals:   11/11/19 0859  BP: 120/70  Pulse: 72  Resp: 18  Temp: (!) 97.2 F (36.2 C)   There is no height or weight on file to calculate BMI. Physical Exam  Constitutional:  Oriented to person, place, and time. Well-developed and well-nourished.  HENT:  Head: Normocephalic.  Mouth/Throat: Oropharynx is clear and moist.  Eyes: Pupils are equal, round, and reactive to light.  Neck: Neck supple.  Cardiovascular: Normal rate and normal heart sounds.  No murmur heard. Pulmonary/Chest: Effort normal and breath sounds normal. No respiratory distress. No wheezes. Continues to have Rales Bilateral Abdominal: Soft. Bowel sounds are normal. No distension. There is no tenderness. There is no rebound.  Musculoskeletal: Mild Edema Bilateral I was Able to move her Hip with no Pain. Both Abduction and Flexion was normal with no Pain Lymphadenopathy: none Neurological: Alert and oriented to person, place, and time.  Skin: Skin is warm and dry.  Psychiatric: Normal mood and affect. Behavior is normal. Thought content normal.    Labs reviewed: Basic Metabolic Panel: Recent Labs    12/21/18 0019 12/21/18 0019 12/22/18 0304 12/22/18 0304 12/23/18 0207 12/29/18 0000 01/30/19 0000 03/30/19 0000 04/25/19 0000 04/26/19 0000  NA 136   < > 136   < > 132*   < > 137 136* 137  --   K 5.0   < > 4.1   < > 4.2   < > 4.7 4.4 4.2  --   CL 106   < > 103  --  102  --   --   --   --  102  CO2 23   < > 25  --  25  --   --   --   --  28  GLUCOSE 140*  --  137*  --  110*  --   --   --   --   --   BUN 35*   < > 14   < > 20   < > 25* 25* 48*  --   CREATININE 1.21*   < > 0.91   < > 1.37*   < > 1.2* 1.1 1.1  --   CALCIUM 8.9   < > 8.3*  --  8.4*  --   --   --   --  9.0  MG  --   --  1.8  --   --   --   --   --   --   --    < > = values in this interval not displayed.  Liver Function Tests: Recent Labs    12/22/18 0304 12/23/18 0207 03/30/19 0000  AST 86* 53* 20  ALT 64* 37 11  ALKPHOS 82 69 92  BILITOT 0.6 0.7  --   PROT 5.6* 5.4*  --   ALBUMIN 2.6* 2.3*  --    No results for input(s): LIPASE, AMYLASE in the last 8760 hours. No results for input(s): AMMONIA in the last 8760  hours. CBC: Recent Labs    12/21/18 0019 12/21/18 0019 12/22/18 0304 12/22/18 0304 12/23/18 0207 12/23/18 0207 01/30/19 0000 01/30/19 0000 02/16/19 0000 02/20/19 0000 03/30/19 0000  WBC 10.4   < > 10.4   < > 10.9*  --  7.6  --  10.8  --  7.9  NEUTROABS 7.5  --  9.4*  --  7.3  --   --   --   --   --   --   HGB 11.3*   < > 9.3*   < > 8.0*   < > 9.5*   < > 10.2*  10.2* 9.8* 9.8*  HCT 35.8*   < > 28.7*   < > 25.4*   < > 28*   < > 31*  31* 29* 30*  MCV 93.2  --  93.8  --  93.7  --   --   --   --   --   --   PLT 319   < > 234   < > 189   < > 431*   < > 387  387 419* 532*   < > = values in this interval not displayed.   Lipid Panel: No results for input(s): CHOL, HDL, LDLCALC, TRIG, CHOLHDL, LDLDIRECT in the last 8760 hours. No results found for: HGBA1C  Procedures since last visit: No results found.  Assessment/Plan  Right hip pain S/p right hip arthroplasty At this time we will hold off doing the x-ray I wonder if she is having pain because she is  doing her transfers without any help. She wants to try tramadol we will start her on 25 twice daily Continue Tylenol She also wanted to know if she can be started on Lyrica but will wait for now If her pain does not get better then we will get some x-rays  reevaluate in 2 weeks  SIADH (syndrome of inappropriate ADH production) ([place]) She states on demeclocycline We will repeat BMP  Persistent atrial fibrillation ([place]) Only on Coreg for rate control Takotsubo cardiomyopathy Continue on torsemide low-dose Persistent Left Pleural Effusion Have d/w Son her POA. No More work up right now Patient stable  Anemia, unspecified type Hemoglobin low but stable continue on iron Repeat CBC Allergic rhinitis,  On Astelin spray COPD Stable on Symbicort Depression  we will continue Wellbutrin for now doing really well  Labs/tests ordered:  * No order type specified * Next appt:  Visit date not found Total time spent in this  patient care encounter was  45_  minutes; greater than 50% of the visit spent counseling patient and staff, reviewing records , Labs and coordinating care for problems addressed at this encounter.

## 2019-11-11 ENCOUNTER — Encounter: Payer: Self-pay | Admitting: Internal Medicine

## 2019-11-14 LAB — BASIC METABOLIC PANEL
BUN: 53 — AB (ref 4–21)
CO2: 25 — AB (ref 13–22)
Chloride: 104 (ref 99–108)
Creatinine: 1.7 — AB (ref 0.5–1.1)
Glucose: 11
Potassium: 4.4 (ref 3.4–5.3)
Sodium: 139 (ref 137–147)

## 2019-11-14 LAB — HEPATIC FUNCTION PANEL
ALT: 15 (ref 7–35)
AST: 19 (ref 13–35)
Alkaline Phosphatase: 87 (ref 25–125)
Bilirubin, Total: 0.5

## 2019-11-14 LAB — CBC AND DIFFERENTIAL
HCT: 38 (ref 36–46)
Hemoglobin: 12.3 (ref 12.0–16.0)
Neutrophils Absolute: 3634
Platelets: 266 (ref 150–399)
WBC: 6.5

## 2019-11-14 LAB — COMPREHENSIVE METABOLIC PANEL
Albumin: 3.4 — AB (ref 3.5–5.0)
Calcium: 9.4 (ref 8.7–10.7)
Globulin: 3

## 2019-11-14 LAB — CBC: RBC: 4.1 (ref 3.87–5.11)

## 2019-11-15 ENCOUNTER — Non-Acute Institutional Stay: Payer: Medicare PPO | Admitting: Nurse Practitioner

## 2019-11-15 DIAGNOSIS — N184 Chronic kidney disease, stage 4 (severe): Secondary | ICD-10-CM | POA: Diagnosis not present

## 2019-11-15 DIAGNOSIS — K219 Gastro-esophageal reflux disease without esophagitis: Secondary | ICD-10-CM

## 2019-11-15 DIAGNOSIS — K5901 Slow transit constipation: Secondary | ICD-10-CM

## 2019-11-15 DIAGNOSIS — M25551 Pain in right hip: Secondary | ICD-10-CM | POA: Insufficient documentation

## 2019-11-15 NOTE — Assessment & Plan Note (Addendum)
C/o upset stomach, nauseated but no vomiting, ? Side effect of Tramadol,  Hx of GI bleed, continue Famotidine, adding Protonix.

## 2019-11-15 NOTE — Progress Notes (Signed)
Location:   [facility] Room Number: 102 Place of Service:  ALF (13) Provider: Lennie Odor Sameerah Nachtigal NP  [name], MD  Patient Care Team: [name], MD as PCP - General (Internal Medicine) [name], MD as PCP - Cardiology (Cardiology) [name], MD as Consulting Physician (Internal Medicine) Ngetich, Nelda Bucks, NP as Nurse Practitioner (Family Medicine)  Extended Emergency Contact Information Primary Emergency Contact: Buzan,Bob Address: [address]           [place], [place] Phone: [phone] Relation: Son Secondary Emergency Contact: Philmore [place] Phone: [phone] Mobile Phone: [phone] Relation: Daughter  Code Status: DNR Goals of care: Advanced Directive information Advanced Directives 11/15/2019  Does Patient Have a Medical Advance Directive? Yes  Type of Paramedic of [place];Living will  Does patient want to make changes to medical advance directive? No - Patient declined  Copy of [place] in Chart? Yes - validated most recent copy scanned in chart (See row information)  Would patient like information on creating a medical advance directive? -  Pre-existing out of facility DNR order (yellow form or pink MOST form) -     Chief Complaint  Patient presents with  . Acute Visit    Worsened Chronic Kidney Disease     HPI:  Pt is a 86 y.o. female seen today for an acute visit for    Past Medical History:  Diagnosis Date  . Amputation of finger of right hand   . Aortic stenosis, mild 03/03/2016   By echo 08/2015  . Arthritis   . Back pain   . Bradycardia 08/28/2015  . CKD (chronic kidney disease) stage 3, GFR 30-59 ml/min   . Closed right hip fracture ([facility]) 12/2018  . Depression   . GERD (gastroesophageal reflux disease)   . GI bleed due to NSAIDs   . Hyperlipidemia   . Hypertension   . Macular degeneration   .  Maxillary sinusitis   . MVP (mitral valve prolapse) 03/03/2016   Bileaflet MVP with mild MR by echo 2016  . Old MI (myocardial infarction)    NSTEMI secondary to stress MI/Takotsubo CM, minimal nonobstructive ASCAD at cath  . Osteoporosis   . Persistent atrial fibrillation ([facility]) 2006   not on anticoagulation due to GI bleed and increased fall risk  . PMR (polymyalgia rheumatica) (HCC)   . SIADH (syndrome of inappropriate ADH production) ([facility])   . Takotsubo cardiomyopathy    EF normalized to 70%  . Ventricular tachycardia ([facility])    on initial presentation of MI   Past Surgical History:  Procedure Laterality Date  . CARDIAC CATHETERIZATION     normal coronary arteries  . CHOLECYSTECTOMY  11/10/2011   Procedure: LAPAROSCOPIC CHOLECYSTECTOMY WITH INTRAOPERATIVE CHOLANGIOGRAM;  Surgeon: Pedro Earls, MD;  Location: [facility];  Service: General;  Laterality: N/A;  . EYE SURGERY  06/10/11   membrane peel   . INTRAMEDULLARY (IM) NAIL INTERTROCHANTERIC Right 12/21/2018   Procedure: INTRAMEDULLARY (IM) NAIL INTERTROCHANTRIC;  Surgeon: Nicholes Stairs, MD;  Location: [facility];  Service: Orthopedics;  Laterality: Right;  . NASAL SINUS SURGERY Left   . ROTATOR CUFF REPAIR  1998  . UMBILICAL HERNIA REPAIR  11/10/2011   Procedure: HERNIA REPAIR UMBILICAL ADULT;  Surgeon: Pedro Earls, MD;  Location: [facility];  Service: General;  Laterality: N/A;  . WISDOM TOOTH EXTRACTION      Allergies  Allergen Reactions  . Amlodipine Swelling    To feet and ankles   . Avelox [Moxifloxacin Hcl In Nacl] Other (See Comments)    Pt does not remember   . Biaxin [Clarithromycin]   . Ceftin [Cefuroxime Axetil]   . Duragesic Disc Transdermal System [Fentanyl] Other (See Comments)    Reaction unknown  . Morphine And Related Other (See Comments)    "My Sister and daughter can,t take it. I've had it before without problems."  . Parathyroid Hormone (Recomb) Other (See Comments)    Made skin dry  . Teriparatide    . Lidocaine Rash    Allergies as of 11/15/2019      Reactions   Amlodipine Swelling   To feet and ankles    Avelox [moxifloxacin Hcl In Nacl] Other (See Comments)   Pt does not remember   Biaxin [clarithromycin]    Ceftin [cefuroxime Axetil]    Duragesic Disc Transdermal System [fentanyl] Other (See Comments)   Reaction unknown   Morphine And Related Other (See Comments)   "My Sister and daughter can,t take it. I've had it before without problems."   Parathyroid Hormone (recomb) Other (See Comments)   Made skin dry   Teriparatide    Lidocaine Rash      Medication List       Accurate as of November 15, 2019 11:59 PM. If you have any questions, ask your nurse or doctor.        acetaminophen 500 MG tablet Commonly known as: TYLENOL Take 500 mg by mouth as needed. Every 6hrs. What changed: Another medication with the same name was removed. Continue taking this medication, and follow the directions you see here.   acetaminophen 325 MG tablet Commonly known as: TYLENOL Take 325 mg by mouth 2 (two) times daily. What changed: Another medication with the same name was removed. Continue taking this medication, and follow the directions you see here.   albuterol (2.5 MG/3ML) 0.083% nebulizer solution Commonly known as: PROVENTIL Take 2.5 mg by nebulization every 6 (six) hours as needed for wheezing or shortness of breath.   amLODipine 5 MG tablet Commonly known as: NORVASC TAKE 1 TABLET BY MOUTH EVERY DAY   azelastine 0.1 % nasal spray Commonly known as: ASTELIN Place 2 sprays into both nostrils 2 (two) times daily as needed for allergies.   budesonide-formoterol 80-4.5 MCG/ACT inhaler Commonly known as: SYMBICORT Inhale 2 puffs into the lungs 2 (two) times daily.   buPROPion 150 MG 12 hr tablet Commonly known as: WELLBUTRIN SR Take 1 tablet (150 mg total) by mouth every morning.   carvedilol 12.5 MG tablet Commonly known as: COREG Take 1 tablet (12.5 mg total) by  mouth 2 (two) times daily with a meal.   cetirizine 10 MG tablet Commonly known as: ZYRTEC Take 10 mg by mouth daily.   demeclocycline 150 MG tablet Commonly known as: DECLOMYCIN Take 150 mg by mouth 2 (two) times daily.   diclofenac sodium 1 % Gel Commonly known as: VOLTAREN Apply 1 application topically 4 (four) times daily as needed (pain).   famotidine 20 MG tablet Commonly known as: PEPCID Take 20 mg by mouth daily.   feeding supplement (PRO-STAT SUGAR FREE 64) Liqd Take 30 mLs by mouth daily.   fluticasone 50 MCG/ACT nasal spray Commonly known as: FLONASE Place into both nostrils daily. 2 Sprays in both nostrils.   Iron 325 (65 Fe) MG Tabs Take 1 tablet by mouth every other day. Give with meals  lactose free nutrition Liqd Take 237 mLs by mouth. Twice a day with meals   lactose free nutrition Liqd Take 237 mLs by mouth 2 (two) times daily. Between Meals 10am-6pm.   Lubricating Eye Drops 0.5-0.9 % ophthalmic solution Generic drug: carboxymethylcellul-glycerin Place 1 drop into both eyes 3 (three) times daily as needed for dry eyes. For allergies   memantine 5 MG tablet Commonly known as: NAMENDA Take 5 mg by mouth 2 (two) times daily.   ondansetron 4 MG tablet Commonly known as: ZOFRAN Take 1 tablet (4 mg total) by mouth every 6 (six) hours as needed for nausea.   polyethylene glycol 17 g packet Commonly known as: MIRALAX / GLYCOLAX Take 17 g by mouth every other day.   torsemide 20 MG tablet Commonly known as: DEMADEX Take 20 mg by mouth. Once A Day on Mon, Wed, Fri   traMADol 50 MG tablet Commonly known as: ULTRAM Take 50 mg by mouth 2 (two) times daily.   zinc oxide 20 % ointment Apply 1 application topically as needed for irritation. To buttocks after every incontinent episode and as needed for redness. May keep at bedside      ROS was provided with assistance of staff.  Review of Systems  Constitutional: Negative for activity change,  appetite change, chills, diaphoresis, fatigue and fever.  HENT: Positive for hearing loss. Negative for congestion and voice change.   Eyes: Negative for visual disturbance.  Respiratory: Negative for cough, shortness of breath and wheezing.   Cardiovascular: Positive for leg swelling. Negative for chest pain and palpitations.  Gastrointestinal: Positive for nausea. Negative for abdominal distention, abdominal pain, constipation, diarrhea and vomiting.       C/o nauseated this morning, but better now, did not vomit, c/o loose stools.   Genitourinary: Negative for difficulty urinating, dysuria and urgency.  Musculoskeletal: Positive for arthralgias and gait problem.  Skin: Negative for color change and pallor.  Neurological: Negative for dizziness, speech difficulty, weakness and headaches.       Memory lapses.   Psychiatric/Behavioral: Negative for agitation, behavioral problems, hallucinations and sleep disturbance. The patient is not nervous/anxious.     Immunization History  Administered Date(s) Administered  . Influenza, High Dose Seasonal PF 07/12/2017, 08/03/2019  . PFIZER SARS-COV-2 Vaccination 10/23/2019   Pertinent  Health Maintenance Due  Topic Date Due  . DEXA SCAN  02/20/1990  . PNA vac Low Risk Adult (1 of 2 - PCV13) 02/20/1990  . INFLUENZA VACCINE  Completed   No flowsheet data found. Functional Status Survey:    Vitals:   11/15/19 1251  BP: 112/68  Pulse: 66  Resp: 18  Temp: 98.2 F (36.8 C)  SpO2: 94%  Weight: 127 lb 6.4 oz (57.8 kg)  Height: 5' (1.524 m)   Body mass index is 24.88 kg/m. Physical Exam Vitals and nursing note reviewed.  Constitutional:      General: She is not in acute distress.    Appearance: Normal appearance. She is not ill-appearing, toxic-appearing or diaphoretic.  HENT:     Head: Normocephalic and atraumatic.     Nose: Nose normal.     Mouth/Throat:     Mouth: Mucous membranes are moist.  Eyes:     Extraocular Movements:  Extraocular movements intact.     Conjunctiva/sclera: Conjunctivae normal.     Pupils: Pupils are equal, round, and reactive to light.  Cardiovascular:     Rate and Rhythm: Normal rate and regular rhythm.     Heart sounds: No  murmur.  Pulmonary:     Breath sounds: Rales present. No wheezing or rhonchi.     Comments: Bibasilar rales.  Abdominal:     General: Bowel sounds are normal. There is no distension.     Tenderness: There is no abdominal tenderness. There is no right CVA tenderness, left CVA tenderness, guarding or rebound.  Musculoskeletal:     Cervical back: Normal range of motion and neck supple.     Right lower leg: Edema present.     Left lower leg: Edema present.     Comments: Trace edema BLE  Skin:    General: Skin is warm and dry.  Neurological:     General: No focal deficit present.     Mental Status: She is alert. Mental status is at baseline.     Motor: No weakness.     Gait: Gait abnormal.     Comments: Oriented to person, place.   Psychiatric:        Mood and Affect: Mood normal.        Behavior: Behavior normal.     Labs reviewed: Recent Labs    12/21/18 0019 12/21/18 0019 12/22/18 0304 12/22/18 0304 12/23/18 0207 12/29/18 0000 01/30/19 0000 03/30/19 0000 04/25/19 0000 04/26/19 0000  NA 136   < > 136   < > 132*   < > 137 136* 137  --   K 5.0   < > 4.1   < > 4.2   < > 4.7 4.4 4.2  --   CL 106   < > 103  --  102  --   --   --   --  102  CO2 23   < > 25  --  25  --   --   --   --  28  GLUCOSE 140*  --  137*  --  110*  --   --   --   --   --   BUN 35*   < > 14   < > 20   < > 25* 25* 48*  --   CREATININE 1.21*   < > 0.91   < > 1.37*   < > 1.2* 1.1 1.1  --   CALCIUM 8.9   < > 8.3*  --  8.4*  --   --   --   --  9.0  MG  --   --  1.8  --   --   --   --   --   --   --    < > = values in this interval not displayed.   Recent Labs    12/22/18 0304 12/23/18 0207 03/30/19 0000  AST 86* 53* 20  ALT 64* 37 11  ALKPHOS 82 69 92  BILITOT 0.6 0.7  --     PROT 5.6* 5.4*  --   ALBUMIN 2.6* 2.3*  --    Recent Labs    12/21/18 0019 12/21/18 0019 12/22/18 0304 12/22/18 0304 12/23/18 0207 12/23/18 0207 01/30/19 0000 01/30/19 0000 02/16/19 0000 02/20/19 0000 03/30/19 0000  WBC 10.4   < > 10.4   < > 10.9*  --  7.6  --  10.8  --  7.9  NEUTROABS 7.5  --  9.4*  --  7.3  --   --   --   --   --   --   HGB 11.3*   < > 9.3*   < > 8.0*   < >  9.5*   < > 10.2*  10.2* 9.8* 9.8*  HCT 35.8*   < > 28.7*   < > 25.4*   < > 28*   < > 31*  31* 29* 30*  MCV 93.2  --  93.8  --  93.7  --   --   --   --   --   --   PLT 319   < > 234   < > 189   < > 431*   < > 387  387 419* 532*   < > = values in this interval not displayed.   Lab Results  Component Value Date   TSH 2.600 09/19/2018   No results found for: HGBA1C No results found for: CHOL, HDL, LDLCALC, LDLDIRECT, TRIG, CHOLHDL  Significant Diagnostic Results in last 30 days:  No results found.  Assessment/Plan: CKD (chronic kidney disease) stage 4, GFR 15-29 ml/min (HCC) 08/31/19 TSH 1.51, wbc 8.3, Hgb 12.1, plt 265, Na 140, K 4.0, Bun 41, creat 1.35, TP 6.9, albumin 3.8 11/14/19 Na 139, K 4.4, Bun 53, creat 1.71, TP 6.4, albumin 3.4, wbc 6.5, Hgb 12.3, plt 266, Neutrophils 55.9 Repeat CMP/eGFR one wk  Slow transit constipation C/o loose stools, change MiraLax to prn, observe.   Esophageal reflux C/o upset stomach, nauseated but no vomiting, ? Side effect of Tramadol,  Hx of GI bleed, continue Famotidine, adding Protonix.   Right hip pain S/p right hip fx, s/p surgery, added Tramadol 11/09/19 in addition to Tylenol, no pain complained today, but some concerns of upset stomach. Will observe.     Family/ staff Communication: plan of care reviewed with the patient and charge nurse.   Labs/tests ordered:  CMP/eGFR one wk  Time spend 40 minutes.

## 2019-11-15 NOTE — Assessment & Plan Note (Signed)
C/o loose stools, change MiraLax to prn, observe.

## 2019-11-15 NOTE — Assessment & Plan Note (Signed)
08/31/19 TSH 1.51, wbc 8.3, Hgb 12.1, plt 265, Na 140, K 4.0, Bun 41, creat 1.35, TP 6.9, albumin 3.8 11/14/19 Na 139, K 4.4, Bun 53, creat 1.71, TP 6.4, albumin 3.4, wbc 6.5, Hgb 12.3, plt 266, Neutrophils 55.9 Repeat CMP/eGFR one wk

## 2019-11-15 NOTE — Assessment & Plan Note (Signed)
S/p right hip fx, s/p surgery, added Tramadol 11/09/19 in addition to Tylenol, no pain complained today, but some concerns of upset stomach. Will observe.

## 2019-11-16 ENCOUNTER — Encounter: Payer: Self-pay | Admitting: Nurse Practitioner

## 2019-11-23 LAB — BASIC METABOLIC PANEL
BUN: 39 — AB (ref 4–21)
CO2: 29 — AB (ref 13–22)
Chloride: 104 (ref 99–108)
Creatinine: 1.3 — AB (ref 0.5–1.1)
Glucose: 79
Potassium: 3.9 (ref 3.4–5.3)
Sodium: 140 (ref 137–147)

## 2019-11-23 LAB — HEPATIC FUNCTION PANEL
ALT: 13 (ref 7–35)
AST: 15 (ref 13–35)
Alkaline Phosphatase: 82 (ref 25–125)
Bilirubin, Total: 0.5

## 2019-11-23 LAB — COMPREHENSIVE METABOLIC PANEL
Albumin: 3 — AB (ref 3.5–5.0)
Calcium: 8.7 (ref 8.7–10.7)
Globulin: 2.7

## 2019-11-24 ENCOUNTER — Non-Acute Institutional Stay: Payer: Medicare PPO | Admitting: Internal Medicine

## 2019-11-24 DIAGNOSIS — M25551 Pain in right hip: Secondary | ICD-10-CM

## 2019-11-24 DIAGNOSIS — N184 Chronic kidney disease, stage 4 (severe): Secondary | ICD-10-CM

## 2019-11-24 DIAGNOSIS — I4819 Other persistent atrial fibrillation: Secondary | ICD-10-CM | POA: Diagnosis not present

## 2019-11-24 DIAGNOSIS — R600 Localized edema: Secondary | ICD-10-CM

## 2019-11-25 ENCOUNTER — Encounter: Payer: Self-pay | Admitting: Internal Medicine

## 2019-11-25 NOTE — Progress Notes (Signed)
Location: [place] of Service:  ALF (13)  Provider:   Code Status:  Goals of Care:  Advanced Directives 11/15/2019  Does Patient Have a Medical Advance Directive? Yes  Type of Paramedic of [place];Living will  Does patient want to make changes to medical advance directive? No - Patient declined  Copy of [place] in Chart? Yes - validated most recent copy scanned in chart (See row information)  Would patient like information on creating a medical advance directive? -  Pre-existing out of facility DNR order (yellow form or pink MOST form) -     Chief Complaint  Patient presents with  . Acute Visit    HPI: Patient is a 86 y.o. female seen today for an acute visit for Right Hip Pain  She has a history of Takotsubo's cardiomyopathy with EF of 55 to 123456, diastolic dysfunction, hypertension, PAF not on any anticoagulation, SIADH, depression She has been in SNF since 3/04 after her Right hip fracture.She underwent InterMedullary Implant on 03/04. Patient was recently admitted into AL after staying in SNF for almost a year. She is now independent in her ADLs.  Patient was recently seen for right hip pain. She was started on tramadol twice a day. The patient states that has not helped her pain. She continues to have a right hip and groin pain. She is doing more for herself now that she is in a AL. Patient wants to know if she can be started on Lyrica. She said that she was told by her pain clinic. We had taken her off high doses of tramadol and Lyrica due to confusion when she was admitted in SNF She is mostly wheelchair dependent but is able to do her transfers. Is not using her walker right now  Past Medical History:  Diagnosis Date  . Amputation of finger of right hand   . Aortic stenosis, mild 03/03/2016   By echo 08/2015  . Arthritis   . Back pain   . Bradycardia 08/28/2015  . CKD (chronic kidney disease) stage  3, GFR 30-59 ml/min   . Closed right hip fracture ([place]) 12/2018  . Depression   . GERD (gastroesophageal reflux disease)   . GI bleed due to NSAIDs   . Hyperlipidemia   . Hypertension   . Macular degeneration   . Maxillary sinusitis   . MVP (mitral valve prolapse) 03/03/2016   Bileaflet MVP with mild MR by echo 2016  . Old MI (myocardial infarction)    NSTEMI secondary to stress MI/Takotsubo CM, minimal nonobstructive ASCAD at cath  . Osteoporosis   . Persistent atrial fibrillation ([place]) 2006   not on anticoagulation due to GI bleed and increased fall risk  . PMR (polymyalgia rheumatica) ([facility])   . SIADH (syndrome of inappropriate ADH production) ([place])   . Takotsubo cardiomyopathy    EF normalized to 70%  . Ventricular tachycardia ([place])    on initial presentation of MI    Past Surgical History:  Procedure Laterality Date  . CARDIAC CATHETERIZATION     normal coronary arteries  . CHOLECYSTECTOMY  11/10/2011   Procedure: LAPAROSCOPIC CHOLECYSTECTOMY WITH INTRAOPERATIVE CHOLANGIOGRAM;  Surgeon: Pedro Earls, MD;  Location: [facility];  Service: General;  Laterality: N/A;  . EYE SURGERY  06/10/11   membrane peel   . INTRAMEDULLARY (IM) NAIL INTERTROCHANTERIC Right 12/21/2018   Procedure: INTRAMEDULLARY (IM) NAIL INTERTROCHANTRIC;  Surgeon: Nicholes Stairs, MD;  Location: [facility];  Service: Orthopedics;  Laterality: Right;  . NASAL SINUS SURGERY Left   . ROTATOR CUFF REPAIR  1998  . UMBILICAL HERNIA REPAIR  11/10/2011   Procedure: HERNIA REPAIR UMBILICAL ADULT;  Surgeon: Pedro Earls, MD;  Location: [facility];  Service: General;  Laterality: N/A;  . WISDOM TOOTH EXTRACTION      Allergies  Allergen Reactions  . Amlodipine Swelling    To feet and ankles   . Avelox [Moxifloxacin Hcl In Nacl] Other (See Comments)    Pt does not remember   . Biaxin [Clarithromycin]   . Ceftin [Cefuroxime Axetil]   . Duragesic Disc Transdermal System [Fentanyl] Other (See Comments)    Reaction  unknown  . Morphine And Related Other (See Comments)    "My Sister and daughter can,t take it. I've had it before without problems."  . Parathyroid Hormone (Recomb) Other (See Comments)    Made skin dry  . Teriparatide   . Lidocaine Rash    Outpatient Encounter Medications as of 11/24/2019  Medication Sig  . acetaminophen (TYLENOL) 325 MG tablet Take 325 mg by mouth 2 (two) times daily.  Marland Kitchen acetaminophen (TYLENOL) 500 MG tablet Take 500 mg by mouth as needed. Every 6hrs.  . albuterol (PROVENTIL) (2.5 MG/3ML) 0.083% nebulizer solution Take 2.5 mg by nebulization every 6 (six) hours as needed for wheezing or shortness of breath.  . Amino Acids-Protein Hydrolys (FEEDING SUPPLEMENT, PRO-STAT SUGAR FREE 64,) LIQD Take 30 mLs by mouth daily.  Marland Kitchen amLODipine (NORVASC) 5 MG tablet TAKE 1 TABLET BY MOUTH EVERY DAY  . azelastine (ASTELIN) 0.1 % nasal spray Place 2 sprays into both nostrils 2 (two) times daily as needed for allergies.   . budesonide-formoterol (SYMBICORT) 80-4.5 MCG/ACT inhaler Inhale 2 puffs into the lungs 2 (two) times daily.   Marland Kitchen buPROPion (WELLBUTRIN SR) 150 MG 12 hr tablet Take 1 tablet (150 mg total) by mouth every morning.  . carboxymethylcellul-glycerin (LUBRICATING EYE DROPS) 0.5-0.9 % ophthalmic solution Place 1 drop into both eyes 3 (three) times daily as needed for dry eyes. For allergies  . carvedilol (COREG) 12.5 MG tablet Take 1 tablet (12.5 mg total) by mouth 2 (two) times daily with a meal.  . cetirizine (ZYRTEC) 10 MG tablet Take 10 mg by mouth daily.   Marland Kitchen demeclocycline (DECLOMYCIN) 150 MG tablet Take 150 mg by mouth 2 (two) times daily.   . diclofenac sodium (VOLTAREN) 1 % GEL Apply 1 application topically 4 (four) times daily as needed (pain).   . famotidine (PEPCID) 20 MG tablet Take 20 mg by mouth daily.  . Ferrous Sulfate (IRON) 325 (65 Fe) MG TABS Take 1 tablet by mouth every other day. Give with meals  . fluticasone (FLONASE) 50 MCG/ACT nasal spray Place into both  nostrils daily. 2 Sprays in both nostrils.  . lactose free nutrition (BOOST PLUS) LIQD Take 237 mLs by mouth 2 (two) times daily. Between Meals 10am-6pm.  . lactose free nutrition (BOOST) LIQD Take 237 mLs by mouth. Twice a day with meals  . memantine (NAMENDA) 5 MG tablet Take 5 mg by mouth 2 (two) times daily.   . ondansetron (ZOFRAN) 4 MG tablet Take 1 tablet (4 mg total) by mouth every 6 (six) hours as needed for nausea.  . polyethylene glycol (MIRALAX / GLYCOLAX) packet Take 17 g by mouth every other day.   . torsemide (DEMADEX) 20 MG tablet Take 20 mg by mouth. Once A Day on Mon, Wed, Fri  . traMADol (ULTRAM) 50  MG tablet Take 25 mg by mouth 2 (two) times daily.   Marland Kitchen zinc oxide 20 % ointment Apply 1 application topically as needed for irritation. To buttocks after every incontinent episode and as needed for redness. May keep at bedside   No facility-administered encounter medications on file as of 11/24/2019.    Review of Systems:  Review of Systems  Constitutional: Negative.   HENT: Negative.   Respiratory: Positive for cough and shortness of breath.   Cardiovascular: Positive for leg swelling.  Gastrointestinal: Negative.   Genitourinary: Negative.   Musculoskeletal: Positive for arthralgias, gait problem and myalgias.  Skin: Negative.   Psychiatric/Behavioral: Negative.     Health Maintenance  Topic Date Due  . TETANUS/TDAP  02/21/1942  . DEXA SCAN  02/20/1990  . PNA vac Low Risk Adult (1 of 2 - PCV13) 02/20/1990  . INFLUENZA VACCINE  Completed    Physical Exam: Vitals:   11/25/19 1010  BP: 122/68  Pulse: 68  Resp: (!) 22  Temp: 97.8 F (36.6 C)  SpO2: 95%  Weight: 124 lb (56.2 kg)   Body mass index is 24.22 kg/m. Physical Exam Vitals reviewed.  Constitutional:      Appearance: Normal appearance.  HENT:     Head: Normocephalic.     Nose: Nose normal.     Mouth/Throat:     Mouth: Mucous membranes are moist.     Pharynx: Oropharynx is clear.  Eyes:      Pupils: Pupils are equal, round, and reactive to light.  Cardiovascular:     Rate and Rhythm: Normal rate.     Pulses: Normal pulses.  Pulmonary:     Effort: Pulmonary effort is normal.     Breath sounds: No wheezing.  Abdominal:     General: Abdomen is flat. Bowel sounds are normal.     Palpations: Abdomen is soft.  Musculoskeletal:        General: Swelling present.     Cervical back: Neck supple.     Comments: Moderate Swelling Bilateral  Was able to stand up with her walker and walk few steps with no Issues  Skin:    General: Skin is warm.  Neurological:     General: No focal deficit present.     Mental Status: She is alert and oriented to person, place, and time.  Psychiatric:        Mood and Affect: Mood normal.        Thought Content: Thought content normal.     Labs reviewed: Basic Metabolic Panel: Recent Labs    12/21/18 0019 12/21/18 0019 12/22/18 0304 12/22/18 0304 12/23/18 0207 12/29/18 0000 01/30/19 0000 03/30/19 0000 04/25/19 0000 04/26/19 0000  NA 136   < > 136   < > 132*   < > 137 136* 137  --   K 5.0   < > 4.1   < > 4.2   < > 4.7 4.4 4.2  --   CL 106   < > 103  --  102  --   --   --   --  102  CO2 23   < > 25  --  25  --   --   --   --  28  GLUCOSE 140*  --  137*  --  110*  --   --   --   --   --   BUN 35*   < > 14   < > 20   < > 25*  25* 48*  --   CREATININE 1.21*   < > 0.91   < > 1.37*   < > 1.2* 1.1 1.1  --   CALCIUM 8.9   < > 8.3*  --  8.4*  --   --   --   --  9.0  MG  --   --  1.8  --   --   --   --   --   --   --    < > = values in this interval not displayed.   Liver Function Tests: Recent Labs    12/22/18 0304 12/23/18 0207 03/30/19 0000  AST 86* 53* 20  ALT 64* 37 11  ALKPHOS 82 69 92  BILITOT 0.6 0.7  --   PROT 5.6* 5.4*  --   ALBUMIN 2.6* 2.3*  --    No results for input(s): LIPASE, AMYLASE in the last 8760 hours. No results for input(s): AMMONIA in the last 8760 hours. CBC: Recent Labs    12/21/18 0019 12/21/18 0019  12/22/18 0304 12/22/18 0304 12/23/18 0207 12/23/18 0207 01/30/19 0000 01/30/19 0000 02/16/19 0000 02/20/19 0000 03/30/19 0000  WBC 10.4   < > 10.4   < > 10.9*  --  7.6  --  10.8  --  7.9  NEUTROABS 7.5  --  9.4*  --  7.3  --   --   --   --   --   --   HGB 11.3*   < > 9.3*   < > 8.0*   < > 9.5*   < > 10.2*  10.2* 9.8* 9.8*  HCT 35.8*   < > 28.7*   < > 25.4*   < > 28*   < > 31*  31* 29* 30*  MCV 93.2  --  93.8  --  93.7  --   --   --   --   --   --   PLT 319   < > 234   < > 189   < > 431*   < > 387  387 419* 532*   < > = values in this interval not displayed.   Lipid Panel: No results for input(s): CHOL, HDL, LDLCALC, TRIG, CHOLHDL, LDLDIRECT in the last 8760 hours. No results found for: HGBA1C  Procedures since last visit: No results found.  Assessment/Plan  Right Hip Pain  S/p IM Implant in 3/20 Will start on Lyrica 50 mg QHS Continue Tramadol Xray of right hip and Pelvis Therapy to evaluate Possible Ortho Follow up if Pain Worsend LE edema Worsen Will increase her Demadex to QD for 2 weeks Check BMP Worsening CKD BUN of 53 Creat 1.71 Will get worse with Demadex but will continue to monitor   Other Issues SIADH (syndrome of inappropriate ADH production) ([place]) She states on demeclocycline  Persistent atrial fibrillation ([place]) Only on Coreg for rate control Takotsubo cardiomyopathy  Persistent Left Pleural Effusion Have d/w Son her POA. No More work up right now Patient stable  Anemia, unspecified type Hemoglobin  stable continue on iron  Allergic rhinitis,  On Astelin spray and Claritin COPD Stable on Symbicort Depression  we will continue Wellbutrin for now doing really well Labs/tests ordered:  * No order type specified * Next appt:  Visit date not found

## 2019-11-27 ENCOUNTER — Other Ambulatory Visit: Payer: Self-pay | Admitting: *Deleted

## 2019-11-27 MED ORDER — PREGABALIN 50 MG PO CAPS: 50.0000 mg | ORAL_CAPSULE | Freq: Every day | ORAL | 0 refills | Status: DC

## 2019-11-27 NOTE — Telephone Encounter (Signed)
Received fax refill Request from [facility].  Pended Rx and sent to Dr. Lyndel Safe for approval.   Added medication to current medication list.  Assessment/Plan 11/24/2019:  Right Hip Pain  S/p IM Implant in 3/20 Will start on Lyrica 50 mg QHS Continue Tramadol Xray of right hip and Pelvis Therapy to evaluate Possible Ortho Follow up if Pain Worsend

## 2019-12-07 ENCOUNTER — Other Ambulatory Visit: Payer: Self-pay | Admitting: *Deleted

## 2019-12-07 LAB — BASIC METABOLIC PANEL
BUN: 47 — AB (ref 4–21)
CO2: 31 — AB (ref 13–22)
Chloride: 103 (ref 99–108)
Creatinine: 1.6 — AB (ref 0.5–1.1)
Glucose: 74
Potassium: 4.5 (ref 3.4–5.3)
Sodium: 142 (ref 137–147)

## 2019-12-07 LAB — COMPREHENSIVE METABOLIC PANEL: Calcium: 9.2 (ref 8.7–10.7)

## 2019-12-07 MED ORDER — TRAMADOL HCL 50 MG PO TABS: 25.0000 mg | ORAL_TABLET | Freq: Two times a day (BID) | ORAL | 0 refills | Status: DC

## 2019-12-07 NOTE — Telephone Encounter (Signed)
Received fax from [facility] requesting refill °Pended Rx and sent to Dr. Gupta for approval.  °

## 2019-12-08 ENCOUNTER — Other Ambulatory Visit: Payer: Self-pay | Admitting: *Deleted

## 2019-12-08 ENCOUNTER — Non-Acute Institutional Stay: Payer: Medicare PPO | Admitting: Internal Medicine

## 2019-12-08 ENCOUNTER — Encounter: Payer: Self-pay | Admitting: Internal Medicine

## 2019-12-08 DIAGNOSIS — I4819 Other persistent atrial fibrillation: Secondary | ICD-10-CM

## 2019-12-08 DIAGNOSIS — N184 Chronic kidney disease, stage 4 (severe): Secondary | ICD-10-CM | POA: Diagnosis not present

## 2019-12-08 DIAGNOSIS — R600 Localized edema: Secondary | ICD-10-CM

## 2019-12-08 DIAGNOSIS — M25551 Pain in right hip: Secondary | ICD-10-CM

## 2019-12-08 MED ORDER — PREGABALIN 25 MG PO CAPS: ORAL_CAPSULE | ORAL | 0 refills | Status: DC

## 2019-12-08 MED ORDER — PREGABALIN 50 MG PO CAPS: 50.0000 mg | ORAL_CAPSULE | Freq: Every day | ORAL | 0 refills | Status: DC

## 2019-12-08 NOTE — Progress Notes (Signed)
Location:   [facility] Room Number: [place] of Service:  ALF [phone]) Provider: [name], MD  [name], MD  Patient Care Team: [name], MD as PCP - General (Internal Medicine) [name], MD as PCP - Cardiology (Cardiology) [name], MD as Consulting Physician (Internal Medicine) Ngetich, Nelda Bucks, NP as Nurse Practitioner (Family Medicine)  Extended Emergency Contact Information Primary Emergency Contact: Kitchings,Bob Address: [address]           [place], [place] Phone: [phone] Relation: Son Secondary Emergency Contact: Philmore [place] Phone: [phone] Mobile Phone: [phone] Relation: Daughter  Code Status:  DNR Goals of care: Advanced Directive information Advanced Directives 12/08/2019  Does Patient Have a Medical Advance Directive? Yes  Type of Advance Directive [place]  Does patient want to make changes to medical advance directive? No - Patient declined  Copy of [place] in Chart? Yes - validated most recent copy scanned in chart (See row information)  Would patient like information on creating a medical advance directive? -  Pre-existing out of facility DNR order (yellow form or pink MOST form) -     Chief Complaint  Patient presents with  . Acute Visit    LE Swelling    HPI:  Pt is a 86 y.o. female seen today for an acute visit for LE swelling and Follow up of Right Hip Pain  She has a history of Takotsubo's cardiomyopathy with EF of 55 to [id number], diastolic dysfunction, hypertension, PAF not on any anticoagulation, SIADH, depression She has been in SNF since 3/04 after her Right hip fracture.She underwent InterMedullary Implant on 03/04. Patient was recently admitted into AL after staying in SNF for almost a year. She is now independent in her ADLs.  Seen today for  LE swelling Her  swelling is much better by [facility] but now she has more swelling in and around her Feet No SOB or Cough Right Hip Pain Was started on Tramadol and Lyrica but says pain is better but wanted to know if I can increase her Lyrica Her Xray did not show anything Acute. Doesn't want me to increase her Tramadol due to her h/o Confusion on Higher Dose  Past Medical History:  Diagnosis Date  . Amputation of finger of right hand   . Aortic stenosis, mild 03/03/2016   By echo 08/2015  . Arthritis   . Back pain   . Bradycardia 08/28/2015  . CKD (chronic kidney disease) stage 3, GFR 30-59 ml/min   . Closed right hip fracture ([place]) 12/2018  . Depression   . GERD (gastroesophageal reflux disease)   . GI bleed due to NSAIDs   . Hyperlipidemia   . Hypertension   . Macular degeneration   . Maxillary sinusitis   . MVP (mitral valve prolapse) 03/03/2016   Bileaflet MVP with mild MR by echo 2016  . Old MI (myocardial infarction)    NSTEMI secondary to stress MI/Takotsubo CM, minimal nonobstructive ASCAD at cath  . Osteoporosis   . Persistent atrial fibrillation ([place]) 2006   not on anticoagulation due to GI bleed and increased fall risk  . PMR (polymyalgia rheumatica) (HCC)   . SIADH (syndrome of inappropriate ADH production) ([place])   . Takotsubo cardiomyopathy    EF normalized to 70%  . Ventricular tachycardia ([place])    on initial presentation of MI   Past Surgical  History:  Procedure Laterality Date  . CARDIAC CATHETERIZATION     normal coronary arteries  . CHOLECYSTECTOMY  11/10/2011   Procedure: LAPAROSCOPIC CHOLECYSTECTOMY WITH INTRAOPERATIVE CHOLANGIOGRAM;  Surgeon: Pedro Earls, MD;  Location: [facility];  Service: General;  Laterality: N/A;  . EYE SURGERY  06/10/11   membrane peel   . INTRAMEDULLARY (IM) NAIL INTERTROCHANTERIC Right 12/21/2018   Procedure: INTRAMEDULLARY (IM) NAIL INTERTROCHANTRIC;  Surgeon: Nicholes Stairs, MD;  Location: [facility];  Service: Orthopedics;  Laterality:  Right;  . NASAL SINUS SURGERY Left   . ROTATOR CUFF REPAIR  1998  . UMBILICAL HERNIA REPAIR  11/10/2011   Procedure: HERNIA REPAIR UMBILICAL ADULT;  Surgeon: Pedro Earls, MD;  Location: [facility];  Service: General;  Laterality: N/A;  . WISDOM TOOTH EXTRACTION      Allergies  Allergen Reactions  . Amlodipine Swelling    To feet and ankles   . Avelox [Moxifloxacin Hcl In Nacl] Other (See Comments)    Pt does not remember   . Biaxin [Clarithromycin]   . Ceftin [Cefuroxime Axetil]   . Duragesic Disc Transdermal System [Fentanyl] Other (See Comments)    Reaction unknown  . Morphine And Related Other (See Comments)    "My Sister and daughter can,t take it. I've had it before without problems."  . Parathyroid Hormone (Recomb) Other (See Comments)    Made skin dry  . Teriparatide   . Lidocaine Rash    Allergies as of 12/08/2019      Reactions   Amlodipine Swelling   To feet and ankles    Avelox [moxifloxacin Hcl In Nacl] Other (See Comments)   Pt does not remember   Biaxin [clarithromycin]    Ceftin [cefuroxime Axetil]    Duragesic Disc Transdermal System [fentanyl] Other (See Comments)   Reaction unknown   Morphine And Related Other (See Comments)   "My Sister and daughter can,t take it. I've had it before without problems."   Parathyroid Hormone (recomb) Other (See Comments)   Made skin dry   Teriparatide    Lidocaine Rash      Medication List       Accurate as of December 08, 2019  3:27 PM. If you have any questions, ask your nurse or doctor.        acetaminophen 500 MG tablet Commonly known as: TYLENOL Take 500 mg by mouth as needed. Every 6hrs.   acetaminophen 325 MG tablet Commonly known as: TYLENOL Take 325 mg by mouth 2 (two) times daily.   albuterol (2.5 MG/3ML) 0.083% nebulizer solution Commonly known as: PROVENTIL Take 2.5 mg by nebulization every 6 (six) hours as needed for wheezing or shortness of breath.   amLODipine 5 MG tablet Commonly known  as: NORVASC TAKE 1 TABLET BY MOUTH EVERY DAY   azelastine 0.1 % nasal spray Commonly known as: ASTELIN Place 2 sprays into both nostrils 2 (two) times daily as needed for allergies.   budesonide-formoterol 80-4.5 MCG/ACT inhaler Commonly known as: SYMBICORT Inhale 2 puffs into the lungs 2 (two) times daily.   buPROPion 150 MG 12 hr tablet Commonly known as: WELLBUTRIN SR Take 1 tablet (150 mg total) by mouth every morning.   carvedilol 12.5 MG tablet Commonly known as: COREG Take 1 tablet (12.5 mg total) by mouth 2 (two) times daily with a meal.   cetirizine 10 MG tablet Commonly known as: ZYRTEC Take 10 mg by mouth daily.   demeclocycline 150 MG tablet Commonly known as: DECLOMYCIN Take 150  mg by mouth 2 (two) times daily.   diclofenac sodium 1 % Gel Commonly known as: VOLTAREN Apply 1 application topically 4 (four) times daily as needed (pain).   famotidine 20 MG tablet Commonly known as: PEPCID Take 20 mg by mouth daily.   feeding supplement (PRO-STAT SUGAR FREE 64) Liqd Take 30 mLs by mouth daily.   fluticasone 50 MCG/ACT nasal spray Commonly known as: FLONASE Place into both nostrils daily. 2 Sprays in both nostrils.   Iron 325 (65 Fe) MG Tabs Take 1 tablet by mouth every other day. Give with meals   lactose free nutrition Liqd Take 237 mLs by mouth. Twice a day with meals   lactose free nutrition Liqd Take 237 mLs by mouth 2 (two) times daily. Between Meals 10am-6pm.   Lubricating Eye Drops 0.5-0.9 % ophthalmic solution Generic drug: carboxymethylcellul-glycerin Place 1 drop into both eyes 3 (three) times daily as needed for dry eyes. For allergies   memantine 5 MG tablet Commonly known as: NAMENDA Take 5 mg by mouth 2 (two) times daily.   ondansetron 4 MG tablet Commonly known as: ZOFRAN Take 1 tablet (4 mg total) by mouth every 6 (six) hours as needed for nausea.   pantoprazole 40 MG tablet Commonly known as: PROTONIX Take 40 mg by mouth daily.    polyethylene glycol 17 g packet Commonly known as: MIRALAX / GLYCOLAX Take 17 g by mouth every other day.   pregabalin 50 MG capsule Commonly known as: LYRICA Take 1 capsule (50 mg total) by mouth at bedtime.   torsemide 20 MG tablet Commonly known as: DEMADEX Take 20 mg by mouth. Once A Day on Mon, Wed, Fri   traMADol 50 MG tablet Commonly known as: ULTRAM Take 0.5 tablets (25 mg total) by mouth 2 (two) times daily.   zinc oxide 20 % ointment Apply 1 application topically as needed for irritation. To buttocks after every incontinent episode and as needed for redness. May keep at bedside       Review of Systems  Constitutional: Negative.   HENT: Negative.   Respiratory: Negative for cough.   Cardiovascular: Positive for leg swelling.  Gastrointestinal: Negative.   Genitourinary: Negative.   Musculoskeletal: Positive for gait problem and myalgias.  Skin: Negative.   Psychiatric/Behavioral: Negative.     Immunization History  Administered Date(s) Administered  . Influenza, High Dose Seasonal PF 07/12/2017, 08/03/2019  . Moderna SARS-COVID-2 Vaccination 10/23/2019, 11/18/2019   Pertinent  Health Maintenance Due  Topic Date Due  . DEXA SCAN  02/20/1990  . PNA vac Low Risk Adult (1 of 2 - PCV13) 02/20/1990  . INFLUENZA VACCINE  Completed   No flowsheet data found. Functional Status Survey:    Vitals:   12/08/19 1519  BP: 112/68  Pulse: 64  Resp: 18  Temp: 98.2 F (36.8 C)  SpO2: 98%  Weight: 123 lb (55.8 kg)  Height: 5' (1.524 m)   Body mass index is 24.02 kg/m. Physical Exam Vitals reviewed.  Constitutional:      Appearance: Normal appearance.  HENT:     Head: Normocephalic.     Nose: Nose normal.     Mouth/Throat:     Mouth: Mucous membranes are moist.     Pharynx: Oropharynx is clear.  Eyes:     Pupils: Pupils are equal, round, and reactive to light.  Cardiovascular:     Rate and Rhythm: Normal rate.     Pulses: Normal pulses.  Pulmonary:      Effort:  Pulmonary effort is normal. No respiratory distress.     Breath sounds: Normal breath sounds. No wheezing or rales.  Abdominal:     General: Abdomen is flat. Bowel sounds are normal.     Palpations: Abdomen is soft.  Musculoskeletal:     Cervical back: Neck supple.     Comments: Edema Now more in her Feet  Skin:    General: Skin is warm.  Neurological:     General: No focal deficit present.     Mental Status: She is alert.  Psychiatric:        Mood and Affect: Mood normal.        Thought Content: Thought content normal.     Labs reviewed: Recent Labs    12/21/18 0019 12/21/18 0019 12/22/18 0304 12/22/18 0304 12/23/18 0207 12/29/18 0000 11/14/19 0000 11/23/19 0000 12/07/19 0000  NA 136   < > 136   < > 132*   < > 139 140 142  K 5.0   < > 4.1   < > 4.2   < > 4.4 3.9 4.5  CL 106   < > 103   < > 102   < > 104 104 103  CO2 23   < > 25   < > 25   < > 25* 29* 31*  GLUCOSE 140*  --  137*  --  110*  --   --   --   --   BUN 35*   < > 14   < > 20   < > 53* 39* 47*  CREATININE 1.21*   < > 0.91   < > 1.37*   < > 1.7* 1.3* 1.6*  CALCIUM 8.9   < > 8.3*   < > 8.4*   < > 9.4 8.7 9.2  MG  --   --  1.8  --   --   --   --   --   --    < > = values in this interval not displayed.   Recent Labs    12/22/18 0304 12/22/18 0304 12/23/18 0207 12/23/18 0207 03/30/19 0000 11/14/19 0000 11/23/19 0000  AST 86*   < > 53*   < > 20 19 15   ALT 64*   < > 37   < > 11 15 13   ALKPHOS 82   < > 69   < > 92 87 82  BILITOT 0.6  --  0.7  --   --   --   --   PROT 5.6*  --  5.4*  --   --   --   --   ALBUMIN 2.6*   < > 2.3*  --   --  3.4* 3.0*   < > = values in this interval not displayed.   Recent Labs    12/21/18 0019 12/21/18 0019 12/22/18 0304 12/22/18 0304 12/23/18 0207 01/30/19 0000 02/16/19 0000 02/16/19 0000 02/20/19 0000 03/30/19 0000 11/14/19 0000  WBC 10.4   < > 10.4   < > 10.9*   < > 10.8  --   --  7.9 6.5  NEUTROABS 7.5   < > 9.4*  --  7.3  --   --   --   --   --   3,634  HGB 11.3*   < > 9.3*   < > 8.0*   < > 10.2*  10.2*   < > 9.8* 9.8* 12.3  HCT 35.8*   < > 28.7*   < >  25.4*   < > 31*  31*   < > 29* 30* 38  MCV 93.2  --  93.8  --  93.7  --   --   --   --   --   --   PLT 319   < > 234   < > 189   < > 387  387   < > 419* 532* 266   < > = values in this interval not displayed.   Lab Results  Component Value Date   TSH 2.600 09/19/2018   No results found for: HGBA1C No results found for: CHOL, HDL, LDLCALC, LDLDIRECT, TRIG, CHOLHDL  Significant Diagnostic Results in last 30 days:  No results found.  Assessment/Plan  Right Hip Pain  S/p IM Implant in 3/20 Xray was negative for Any Acute changes Will try Add Lyrica 25 mg in AM Continue 50mg  in PM Also Continue Tramadol BID  LE edema Better  Will continue Demadex 20 mg QD BUN and Creat Today was 47/1.6 Repeat BMP IN 2 weeks d/w Nurses to do Ted hoses for her    Other Issues SIADH (syndrome of inappropriate ADH production) ([place]) She states on demeclocycline  Persistent atrial fibrillation (HCC) Only on Coreg for rate control Takotsubo cardiomyopathy  Persistent Left Pleural Effusion Have d/w Son her POA. No More work up right now Patient stable  Anemia, unspecified type Hemoglobin  stable continue on iron  Allergic rhinitis, On Astelin spray and Claritin COPD Stable on Symbicort Depression we will continue Wellbutrin for now doing really well  Family/ staff Communication:   Labs/tests ordered:  BMP

## 2019-12-08 NOTE — Telephone Encounter (Signed)
Received fax request from [facility]. Pended Rx and sent to Dr. Gupta for approval.  

## 2019-12-21 DIAGNOSIS — I10 Essential (primary) hypertension: Secondary | ICD-10-CM | POA: Diagnosis not present

## 2019-12-21 LAB — BASIC METABOLIC PANEL
BUN: 48 — AB (ref 4–21)
CO2: 32 — AB (ref 13–22)
Chloride: 103 (ref 99–108)
Creatinine: 1.3 — AB (ref 0.5–1.1)
Glucose: 76
Potassium: 4.3 (ref 3.4–5.3)
Sodium: 142 (ref 137–147)

## 2019-12-21 LAB — COMPREHENSIVE METABOLIC PANEL: Calcium: 9.5 (ref 8.7–10.7)

## 2019-12-25 ENCOUNTER — Non-Acute Institutional Stay: Payer: Medicare PPO | Admitting: Nurse Practitioner

## 2019-12-25 ENCOUNTER — Encounter: Payer: Self-pay | Admitting: Nurse Practitioner

## 2019-12-25 DIAGNOSIS — I10 Essential (primary) hypertension: Secondary | ICD-10-CM | POA: Diagnosis not present

## 2019-12-25 DIAGNOSIS — M25551 Pain in right hip: Secondary | ICD-10-CM | POA: Diagnosis not present

## 2019-12-25 DIAGNOSIS — W19XXXA Unspecified fall, initial encounter: Secondary | ICD-10-CM | POA: Diagnosis not present

## 2019-12-25 DIAGNOSIS — R4189 Other symptoms and signs involving cognitive functions and awareness: Secondary | ICD-10-CM

## 2019-12-25 DIAGNOSIS — I5189 Other ill-defined heart diseases: Secondary | ICD-10-CM | POA: Diagnosis not present

## 2019-12-25 DIAGNOSIS — I4819 Other persistent atrial fibrillation: Secondary | ICD-10-CM

## 2019-12-25 NOTE — Assessment & Plan Note (Signed)
witnessed fall w/o injury, difficulty to transfer then CNA sat the resident on floor. Prior fall 12/20/19. Update CBC/diff, CMP/eGFR for generalized weakness. The patient needs higher level of care.

## 2019-12-25 NOTE — Assessment & Plan Note (Signed)
3/20  IM pin, 12/25/19 pending Ortho for R hip/knee pain, continue Tramadol, Lyrica, Tylenol.

## 2019-12-25 NOTE — Progress Notes (Signed)
Location:   [facility] Room Number: [place] of Service:  ALF ([phone]) Provider:  Theon Sobotka NP  [name], MD  Patient Care Team: [name], MD as PCP - General (Internal Medicine) [name], MD as PCP - Cardiology (Cardiology) [name], MD as Consulting Physician (Internal Medicine) Ngetich, Nelda Bucks, NP as Nurse Practitioner (Family Medicine)  Extended Emergency Contact Information Primary Emergency Contact: Rucci,Bob Address: [address]           [place], [place] Phone: [phone] Relation: Son Secondary Emergency Contact: Philmore [place] Phone: [phone] Mobile Phone: [phone] Relation: Daughter  Code Status:  DNR Goals of care: Advanced Directive information Advanced Directives 12/08/2019  Does Patient Have a Medical Advance Directive? Yes  Type of Advance Directive [place]  Does patient want to make changes to medical advance directive? No - Patient declined  Copy of [place] in Chart? Yes - validated most recent copy scanned in chart (See row information)  Would patient like information on creating a medical advance directive? -  Pre-existing out of facility DNR order (yellow form or pink MOST form) -     Chief Complaint  Patient presents with  . Acute Visit    Fall    HPI:  Pt is a 86 y.o. female seen today for an acute visit for witnessed fall w/o injury, difficulty to transfer then CNA sat the resident on floor. Prior fall 12/20/19. Hx of right hip/leg/knee pain, s/p IM pin for R hip fx about a year ago, on Tylenol, Tramadol, Lyrica. Edema BLE, on Torsemide 94m qd. Her memory is preserved on Memantine. HTN, blood pressure is controlled on Carvedilol 12.572mbid, Amlodipine 61m72md. Hx of Afib, heart rate is in control, not on anticoagulation therapy due to Hx of GI bleed.     Past Medical History:    Diagnosis Date  . Amputation of finger of right hand   . Aortic stenosis, mild 03/03/2016   By echo 08/2015  . Arthritis   . Back pain   . Bradycardia 08/28/2015  . CKD (chronic kidney disease) stage 3, GFR 30-59 ml/min   . Cl[place] right [place] ([place]  . Depression   . GERD (gastroesophageal reflux disease)   . GI bleed due to NSAIDs   . Hyperlipidemia   . Hypertension   . Macular degeneration   . Maxillary sinusitis   . MVP (mitral valve prolapse) 03/03/2016   Bileaflet MVP with mild MR by echo 2016  . Old MI (myocardial infarction)    NSTEMI secondary to stress MI/Takotsubo CM, minimal nonobstructive ASCAD at cath  . Osteoporosis   . Persistent atrial fibrillation ([facility] [facility]   not on anticoagulation due to GI bleed and increased fall risk  . PMR (polymyalgia rheumatica) (HCC)   . SIADH (syndrome of inappropriate ADH production) ([place] . Takotsubo cardiomyopathy    EF normalized to 70%  . Ventricular tachycardia ([facility]  on initial presentation of MI   Past Surgical History:  Procedure Laterality Date  . CARDIAC CATHETERIZATION     normal coronary arteries  . CHOLECYSTECTOMY  11/10/2011   Procedure: LAPAROSCOPIC CHOLECYSTECTOMY WITH INTRAOPERATIVE CHOLANGIOGRAM;  Surgeon: MatPedro EarlsD;  Location: [facility];  Service: General;  Laterality: N/A;  . EYE SURGERY  06/10/11   membrane peel   . INTRAMEDULLARY (IM) NAIL INTERTROCHANTERIC Right 12/21/2018  Procedure: INTRAMEDULLARY (IM) NAIL INTERTROCHANTRIC;  Surgeon: Nicholes Stairs, MD;  Location: [facility];  Service: Orthopedics;  Laterality: Right;  . NASAL SINUS SURGERY Left   . ROTATOR CUFF REPAIR  1998  . UMBILICAL HERNIA REPAIR  11/10/2011   Procedure: HERNIA REPAIR UMBILICAL ADULT;  Surgeon: Pedro Earls, MD;  Location: [facility];  Service: General;  Laterality: N/A;  . WISDOM TOOTH EXTRACTION      Allergies  Allergen Reactions  . Amlodipine Swelling    To feet and ankles   . Avelox [Moxifloxacin  Hcl In Nacl] Other (See Comments)    Pt does not remember   . Biaxin [Clarithromycin]   . Ceftin [Cefuroxime Axetil]   . Duragesic Disc Transdermal System [Fentanyl] Other (See Comments)    Reaction unknown  . Morphine And Related Other (See Comments)    "My Sister and daughter can,t take it. I've had it before without problems."  . Parathyroid Hormone (Recomb) Other (See Comments)    Made skin dry  . Teriparatide   . Lidocaine Rash    Allergies as of 12/25/2019      Reactions   Amlodipine Swelling   To feet and ankles    Avelox [moxifloxacin Hcl In Nacl] Other (See Comments)   Pt does not remember   Biaxin [clarithromycin]    Ceftin [cefuroxime Axetil]    Duragesic Disc Transdermal System [fentanyl] Other (See Comments)   Reaction unknown   Morphine And Related Other (See Comments)   "My Sister and daughter can,t take it. I've had it before without problems."   Parathyroid Hormone (recomb) Other (See Comments)   Made skin dry   Teriparatide    Lidocaine Rash      Medication List       Accurate as of December 25, 2019  1:30 PM. If you have any questions, ask your nurse or doctor.        acetaminophen 500 MG tablet Commonly known as: TYLENOL Take 500 mg by mouth as needed. Every 6hrs.   acetaminophen 325 MG tablet Commonly known as: TYLENOL Take 650 mg by mouth 2 (two) times daily.   albuterol (2.5 MG/3ML) 0.083% nebulizer solution Commonly known as: PROVENTIL Take 2.5 mg by nebulization every 6 (six) hours as needed for wheezing or shortness of breath.   amLODipine 5 MG tablet Commonly known as: NORVASC TAKE 1 TABLET BY MOUTH EVERY DAY   azelastine 0.1 % nasal spray Commonly known as: ASTELIN Place 2 sprays into both nostrils 2 (two) times daily as needed for allergies.   budesonide-formoterol 80-4.5 MCG/ACT inhaler Commonly known as: SYMBICORT Inhale 2 puffs into the lungs 2 (two) times daily.   buPROPion 150 MG 12 hr tablet Commonly known as: WELLBUTRIN  SR Take 1 tablet (150 mg total) by mouth every morning.   carvedilol 12.5 MG tablet Commonly known as: COREG Take 1 tablet (12.5 mg total) by mouth 2 (two) times daily with a meal.   cetirizine 10 MG tablet Commonly known as: ZYRTEC Take 10 mg by mouth daily.   demeclocycline 150 MG tablet Commonly known as: DECLOMYCIN Take 150 mg by mouth 2 (two) times daily.   diclofenac sodium 1 % Gel Commonly known as: VOLTAREN Apply 1 application topically 4 (four) times daily as needed (pain).   famotidine 20 MG tablet Commonly known as: PEPCID Take 20 mg by mouth daily.   feeding supplement (PRO-STAT SUGAR FREE 64) Liqd Take 30 mLs by mouth daily.   fluticasone 50 MCG/ACT nasal  spray Commonly known as: FLONASE Place into both nostrils daily. 2 Sprays in both nostrils.   Iron 325 (65 Fe) MG Tabs Take 1 tablet by mouth every other day. Give with meals   lactose free nutrition Liqd Take 237 mLs by mouth. Twice a day with meals   lactose free nutrition Liqd Take 237 mLs by mouth 2 (two) times daily. Between Meals 10am-6pm.   Lubricating Eye Drops 0.5-0.9 % ophthalmic solution Generic drug: carboxymethylcellul-glycerin Place 1 drop into both eyes 3 (three) times daily as needed for dry eyes. For allergies   memantine 5 MG tablet Commonly known as: NAMENDA Take 5 mg by mouth 2 (two) times daily.   nystatin powder Commonly known as: MYCOSTATIN/NYSTOP Apply 1 application topically. 100,000 unit/gram Apply Nystatin Powder to reddened areas BID (ie: abdominal folds, bilateral groin areas.),if not healed in 7 days notify NP/MD for further orders Twice A Day   ondansetron 4 MG tablet Commonly known as: ZOFRAN Take 1 tablet (4 mg total) by mouth every 6 (six) hours as needed for nausea.   pantoprazole 40 MG tablet Commonly known as: PROTONIX Take 40 mg by mouth daily.   polyethylene glycol 17 g packet Commonly known as: MIRALAX / GLYCOLAX Take 17 g by mouth every other day.    pregabalin 50 MG capsule Commonly known as: LYRICA Take 1 capsule (50 mg total) by mouth at bedtime.   pregabalin 25 MG capsule Commonly known as: LYRICA Take one capsule by mouth once daily in the morning.   torsemide 20 MG tablet Commonly known as: DEMADEX Take 20 mg by mouth once.   traMADol 50 MG tablet Commonly known as: ULTRAM Take 0.5 tablets (25 mg total) by mouth 2 (two) times daily.   zinc oxide 20 % ointment Apply 1 application topically as needed for irritation. To buttocks after every incontinent episode and as needed for redness. May keep at bedside      ROS was provided with assistance of staff.  Review of Systems  Constitutional: Negative for activity change, appetite change, fatigue and fever.       The patient is more frail, getting weaker gradually.   HENT: Positive for hearing loss. Negative for congestion and voice change.   Eyes: Negative for visual disturbance.  Respiratory: Negative for cough, shortness of breath and wheezing.   Cardiovascular: Positive for leg swelling. Negative for chest pain and palpitations.  Gastrointestinal: Negative for abdominal distention, abdominal pain, constipation, nausea and vomiting.       C/o nauseated this morning, but better now, did not vomit, c/o loose stools.   Genitourinary: Negative for difficulty urinating, dysuria and urgency.  Musculoskeletal: Positive for arthralgias, gait problem and myalgias.       Right hip, leg, knee pain  Skin: Negative for color change and pallor.  Neurological: Negative for dizziness, speech difficulty, weakness and headaches.       Memory lapses.   Psychiatric/Behavioral: Negative for agitation, behavioral problems and sleep disturbance. The patient is not nervous/anxious.     Immunization History  Administered Date(s) Administered  . Influenza, High Dose Seasonal PF 07/12/2017, 08/03/2019  . Moderna SARS-COVID-2 Vaccination 10/23/2019, 11/18/2019   Pertinent  Health Maintenance  Due  Topic Date Due  . DEXA SCAN  02/20/1990  . PNA vac Low Risk Adult (1 of 2 - PCV13) 02/20/1990  . INFLUENZA VACCINE  Completed   No flowsheet data found. Functional Status Survey:    Vitals:   12/25/19 1013  BP: 120/70  Pulse:  67  Resp: 18  Temp: (!) 97 F (36.1 C)  SpO2: 96%  Weight: 129 lb 12.8 oz (58.9 kg)  Height: '4\' 6"'  (1.372 m)   Body mass index is 31.3 kg/m. Physical Exam Vitals and nursing note reviewed.  Constitutional:      General: She is not in acute distress.    Appearance: Normal appearance. She is not ill-appearing, toxic-appearing or diaphoretic.  HENT:     Head: Normocephalic and atraumatic.     Nose: Nose normal.     Mouth/Throat:     Mouth: Mucous membranes are moist.  Eyes:     Extraocular Movements: Extraocular movements intact.     Conjunctiva/sclera: Conjunctivae normal.     Pupils: Pupils are equal, round, and reactive to light.  Cardiovascular:     Rate and Rhythm: Normal rate and regular rhythm.     Heart sounds: Murmur present.  Pulmonary:     Breath sounds: Rales present. No wheezing or rhonchi.     Comments: Bibasilar rales.  Abdominal:     General: Bowel sounds are normal. There is no distension.     Tenderness: There is no abdominal tenderness. There is no guarding or rebound.  Musculoskeletal:     Cervical back: Normal range of motion and neck supple.     Right lower leg: Edema present.     Left lower leg: Edema present.     Comments: 1-2 + edema BLE, mostly in feet  Skin:    General: Skin is warm and dry.  Neurological:     General: No focal deficit present.     Mental Status: She is alert. Mental status is at baseline.     Motor: No weakness.     Gait: Gait abnormal.     Comments: Oriented to person, place.   Psychiatric:        Mood and Affect: Mood normal.        Behavior: Behavior normal.     Labs reviewed: Recent Labs    11/23/19 0000 12/07/19 0000 12/21/19 0000  NA 140 142 142  K 3.9 4.5 4.3  CL 104  103 103  CO2 29* 31* 32*  BUN 39* 47* 48*  CREATININE 1.3* 1.6* 1.3*  CALCIUM 8.7 9.2 9.5   Recent Labs    03/30/19 0000 11/14/19 0000 11/23/19 0000  AST '20 19 15  ' ALT '11 15 13  ' ALKPHOS 92 87 82  ALBUMIN  --  3.4* 3.0*   Recent Labs    02/16/19 0000 02/16/19 0000 02/20/19 0000 03/30/19 0000 11/14/19 0000  WBC 10.8  --   --  7.9 6.5  NEUTROABS  --   --   --   --  3,634  HGB 10.2*  10.2*   < > 9.8* 9.8* 12.3  HCT 31*  31*   < > 29* 30* 38  PLT 387  387   < > 419* 532* 266   < > = values in this interval not displayed.   Lab Results  Component Value Date   TSH 2.600 09/19/2018   No results found for: HGBA1C No results found for: CHOL, HDL, LDLCALC, LDLDIRECT, TRIG, CHOLHDL  Significant Diagnostic Results in last 30 days:  No results found.  Assessment/Plan Fall witnessed fall w/o injury, difficulty to transfer then CNA sat the resident on floor. Prior fall 12/20/19. Update CBC/diff, CMP/eGFR for generalized weakness. The patient needs higher level of care.    Diastolic dysfunction 03/21/77 edema +1 BLE, continue Torsemide, TED.  Essential hypertension, benign Blood pressure is controlled, continue Amlodipine, Carvedilol.   Persistent atrial fibrillation (HCC) Heart rate is in control, no on anticoagulation therapy due to Hx of GI bleed.   Cognitive impairment Continue Memantine for memory.   Right hip pain 3/20  IM pin, 12/25/19 pending Ortho for R hip/knee pain, continue Tramadol, Lyrica, Tylenol.      Family/ staff Communication: plan of care reviewed with the patient and charge nurse.   Labs/tests ordered:  CBC/diff, CMP/eGFR  Time spend 40 minutes.

## 2019-12-25 NOTE — Assessment & Plan Note (Signed)
Heart rate is in control, no on anticoagulation therapy due to Hx of GI bleed.

## 2019-12-25 NOTE — Assessment & Plan Note (Signed)
Blood pressure is controlled, continue Amlodipine, Carvedilol.

## 2019-12-25 NOTE — Assessment & Plan Note (Signed)
Continue Memantine for memory.

## 2019-12-25 NOTE — Assessment & Plan Note (Signed)
12/25/19 edema +1 BLE, continue Torsemide, TED.

## 2019-12-26 ENCOUNTER — Other Ambulatory Visit: Payer: Self-pay | Admitting: *Deleted

## 2019-12-26 DIAGNOSIS — I10 Essential (primary) hypertension: Secondary | ICD-10-CM | POA: Diagnosis not present

## 2019-12-26 LAB — BASIC METABOLIC PANEL
BUN: 50 — AB (ref 4–21)
CO2: 29 — AB (ref 13–22)
Chloride: 106 (ref 99–108)
Creatinine: 1.3 — AB (ref 0.5–1.1)
Glucose: 82
Potassium: 4 (ref 3.4–5.3)
Sodium: 144 (ref 137–147)

## 2019-12-26 LAB — COMPREHENSIVE METABOLIC PANEL
Albumin: 3 — AB (ref 3.5–5.0)
Calcium: 9 (ref 8.7–10.7)
Globulin: 2.8

## 2019-12-26 LAB — HEPATIC FUNCTION PANEL
ALT: 15 (ref 7–35)
AST: 16 (ref 13–35)
Alkaline Phosphatase: 108 (ref 25–125)
Bilirubin, Total: 0.3

## 2019-12-26 LAB — CBC: RBC: 3.83 — AB (ref 3.87–5.11)

## 2019-12-26 LAB — CBC AND DIFFERENTIAL
HCT: 35 — AB (ref 36–46)
Hemoglobin: 11.5 — AB (ref 12.0–16.0)
Neutrophils Absolute: 3903
Platelets: 287 (ref 150–399)
WBC: 6.8

## 2019-12-26 MED ORDER — PREGABALIN 50 MG PO CAPS: 50.0000 mg | ORAL_CAPSULE | Freq: Every day | ORAL | 0 refills | Status: DC

## 2019-12-26 NOTE — Telephone Encounter (Signed)
Received fax from [facility] Pended [facility] and sent to Dr. Gupta for approval.  

## 2020-01-01 ENCOUNTER — Encounter: Payer: Self-pay | Admitting: Nurse Practitioner

## 2020-01-01 ENCOUNTER — Non-Acute Institutional Stay: Payer: Medicare PPO | Admitting: Nurse Practitioner

## 2020-01-01 DIAGNOSIS — R4189 Other symptoms and signs involving cognitive functions and awareness: Secondary | ICD-10-CM | POA: Diagnosis not present

## 2020-01-01 DIAGNOSIS — I10 Essential (primary) hypertension: Secondary | ICD-10-CM

## 2020-01-01 DIAGNOSIS — W19XXXA Unspecified fall, initial encounter: Secondary | ICD-10-CM | POA: Diagnosis not present

## 2020-01-01 DIAGNOSIS — R001 Bradycardia, unspecified: Secondary | ICD-10-CM | POA: Diagnosis not present

## 2020-01-01 DIAGNOSIS — I4819 Other persistent atrial fibrillation: Secondary | ICD-10-CM

## 2020-01-01 DIAGNOSIS — M25551 Pain in right hip: Secondary | ICD-10-CM | POA: Diagnosis not present

## 2020-01-01 NOTE — Assessment & Plan Note (Signed)
3/20  IM pin, 12/25/19 pending Ortho for R hip/knee pain, continue Lyrica, Tylenol. Dc Tramadol per HPOA's request.

## 2020-01-01 NOTE — Progress Notes (Signed)
Location:   [facility] Room Number: 093 Place of Service:  ALF (13) Provider: Lennie Odor Macen Joslin NP  [name], MD  Patient Care Team: [name], MD as PCP - General (Internal Medicine) [name], MD as PCP - Cardiology (Cardiology) [name], MD as Consulting Physician (Internal Medicine) Ngetich, Nelda Bucks, NP as Nurse Practitioner (Family Medicine)  Extended Emergency Contact Information Primary Emergency Contact: Livolsi,Bob Address: [address]           [place], [place] Phone: [phone] Relation: Son Secondary Emergency Contact: Philmore Pal[place] Phone: [phone] Mobile Phone: [phone] Relation: Daughter  Code Status: DNR Goals of care: Advanced Directive information Advanced Directives 01/01/2020  Does Patient Have a Medical Advance Directive? Yes  Type of Paramedic of [place];Living will  Does patient want to make changes to medical advance directive? No - Patient declined  Copy of [place] in Chart? Yes - validated most recent copy scanned in chart (See row information)  Would patient like information on creating a medical advance directive? -  Pre-existing out of facility DNR order (yellow form or pink MOST form) -     Chief Complaint  Patient presents with  . Acute Visit    Falls    HPI:  Pt is a 86 y.o. female seen today for an acute visit for falls: Reported fall the the patient was found lying on the floor on her right side 12/30/19, the patient stated she must have dozed off and fell out of w/c, hit the left face on the trash can. 12/30/19 the patient was assisted with transfer from bed to w/c, the patient's feet began to slip, staff lowered the patient to the floor. 12/31/19 reported fall when the patient was found on the floor lying on the floor again. C/o pain in the right wrist, shoulder, and hip, no apparent  deformity. On Tylenol, Lyrica, Tramadol  Reported HR 52, re checked and upon my examination is 60s, the patient denied chest pian/pressure, palpitation, on Carvedilol. HTN, blood pressure is controlled on amlodipine 5mg  qd, Carvedilol 12.5mg  bid. HPI was provided with assistance of staff, on Memantine for memory.    Past Medical History:  Diagnosis Date  . Amputation of finger of right hand   . Aortic stenosis, mild 03/03/2016   By echo 08/2015  . Arthritis   . Back pain   . Bradycardia 08/28/2015  . CKD (chronic kidney disease) stage 3, GFR 30-59 ml/min   . Closed right hip fracture ([facility]) 12/2018  . Depression   . GERD (gastroesophageal reflux disease)   . GI bleed due to NSAIDs   . Hyperlipidemia   . Hypertension   . Macular degeneration   . Maxillary sinusitis   . MVP (mitral valve prolapse) 03/03/2016   Bileaflet MVP with mild MR by echo 2016  . Old MI (myocardial infarction)    NSTEMI secondary to stress MI/Takotsubo CM, minimal nonobstructive ASCAD at cath  . Osteoporosis   . Persistent atrial fibrillation ([facility]) 2006   not on anticoagulation due to GI bleed and increased fall risk  . PMR (polymyalgia rheumatica) (HCC)   . SIADH (syndrome of inappropriate ADH production) ([facility])   . Takotsubo cardiomyopathy    EF normalized to 70%  . Ventricular tachycardia ([facility])    on initial presentation of MI   Past Surgical History:  Procedure Laterality Date  . CARDIAC CATHETERIZATION  normal coronary arteries  . CHOLECYSTECTOMY  11/10/2011   Procedure: LAPAROSCOPIC CHOLECYSTECTOMY WITH INTRAOPERATIVE CHOLANGIOGRAM;  Surgeon: Pedro Earls, MD;  Location: [facility];  Service: General;  Laterality: N/A;  . EYE SURGERY  06/10/11   membrane peel   . INTRAMEDULLARY (IM) NAIL INTERTROCHANTERIC Right 12/21/2018   Procedure: INTRAMEDULLARY (IM) NAIL INTERTROCHANTRIC;  Surgeon: Nicholes Stairs, MD;  Location: [facility];  Service: Orthopedics;  Laterality: Right;  . NASAL SINUS SURGERY Left    . ROTATOR CUFF REPAIR  1998  . UMBILICAL HERNIA REPAIR  11/10/2011   Procedure: HERNIA REPAIR UMBILICAL ADULT;  Surgeon: Pedro Earls, MD;  Location: [facility];  Service: General;  Laterality: N/A;  . WISDOM TOOTH EXTRACTION      Allergies  Allergen Reactions  . Amlodipine Swelling    To feet and ankles   . Avelox [Moxifloxacin Hcl In Nacl] Other (See Comments)    Pt does not remember   . Biaxin [Clarithromycin]   . Ceftin [Cefuroxime Axetil]   . Duragesic Disc Transdermal System [Fentanyl] Other (See Comments)    Reaction unknown  . Forteo [Parathyroid Hormone (Recomb)] Other (See Comments)    Made skin dry  . Morphine And Related Other (See Comments)    "My Sister and daughter can,t take it. I've had it before without problems."  . Teriparatide   . Lidocaine Rash    Allergies as of 01/01/2020      Reactions   Amlodipine Swelling   To feet and ankles    Avelox [moxifloxacin Hcl In Nacl] Other (See Comments)   Pt does not remember   Biaxin [clarithromycin]    Ceftin [cefuroxime Axetil]    Duragesic Disc Transdermal System [fentanyl] Other (See Comments)   Reaction unknown   Forteo [parathyroid Hormone (recomb)] Other (See Comments)   Made skin dry   Morphine And Related Other (See Comments)   "My Sister and daughter can,t take it. I've had it before without problems."   Teriparatide    Lidocaine Rash      Medication List       Accurate as of January 01, 2020  4:49 PM. If you have any questions, ask your nurse or doctor.        acetaminophen 500 MG tablet Commonly known as: TYLENOL Take 500 mg by mouth as needed. Every 6hrs.   acetaminophen 325 MG tablet Commonly known as: TYLENOL Take 650 mg by mouth 2 (two) times daily.   albuterol (2.5 MG/3ML) 0.083% nebulizer solution Commonly known as: PROVENTIL Take 2.5 mg by nebulization every 6 (six) hours as needed for wheezing or shortness of breath.   amLODipine 5 MG tablet Commonly known as: NORVASC TAKE 1  TABLET BY MOUTH EVERY DAY   azelastine 0.1 % nasal spray Commonly known as: ASTELIN Place 2 sprays into both nostrils 2 (two) times daily as needed for allergies.   budesonide-formoterol 80-4.5 MCG/ACT inhaler Commonly known as: SYMBICORT Inhale 2 puffs into the lungs 2 (two) times daily.   buPROPion 150 MG 12 hr tablet Commonly known as: WELLBUTRIN SR Take 1 tablet (150 mg total) by mouth every morning.   carvedilol 12.5 MG tablet Commonly known as: COREG Take 1 tablet (12.5 mg total) by mouth 2 (two) times daily with a meal.   cetirizine 10 MG tablet Commonly known as: ZYRTEC Take 10 mg by mouth daily.   demeclocycline 150 MG tablet Commonly known as: DECLOMYCIN Take 150 mg by mouth 2 (two) times daily.   diclofenac sodium  1 % Gel Commonly known as: VOLTAREN Apply 1 application topically 4 (four) times daily as needed (pain).   famotidine 20 MG tablet Commonly known as: PEPCID Take 20 mg by mouth daily.   feeding supplement (PRO-STAT SUGAR FREE 64) Liqd Take 30 mLs by mouth daily.   fluticasone 50 MCG/ACT nasal spray Commonly known as: FLONASE Place into both nostrils daily. 2 Sprays in both nostrils.   Iron 325 (65 Fe) MG Tabs Take 1 tablet by mouth every other day. Give with meals   lactose free nutrition Liqd Take 237 mLs by mouth 2 (two) times daily. Between Meals 10am-6pm. What changed: Another medication with the same name was removed. Continue taking this medication, and follow the directions you see here. Changed by: Joya Willmott X [place] Mckeough, NP   Lubricating Eye Drops 0.5-0.9 % ophthalmic solution Generic drug: carboxymethylcellul-glycerin Place 1 drop into both eyes 3 (three) times daily as needed for dry eyes. For allergies   memantine 5 MG tablet Commonly known as: NAMENDA Take 5 mg by mouth 2 (two) times daily.   ondansetron 4 MG tablet Commonly known as: ZOFRAN Take 1 tablet (4 mg total) by mouth every 6 (six) hours as needed for nausea.   pantoprazole  40 MG tablet Commonly known as: PROTONIX Take 40 mg by mouth daily.   polyethylene glycol 17 g packet Commonly known as: MIRALAX / GLYCOLAX Take 17 g by mouth every other day.   pregabalin 25 MG capsule Commonly known as: LYRICA Take one capsule by mouth once daily in the morning.   pregabalin 50 MG capsule Commonly known as: LYRICA Take 1 capsule (50 mg total) by mouth at bedtime.   torsemide 20 MG tablet Commonly known as: DEMADEX Take 20 mg by mouth once.   traMADol 50 MG tablet Commonly known as: ULTRAM Take 0.5 tablets (25 mg total) by mouth 2 (two) times daily.   zinc oxide 20 % ointment Apply 1 application topically as needed for irritation. To buttocks after every incontinent episode and as needed for redness. May keep at bedside      ROS was provided with assistance of staff.  Review of Systems  Constitutional: Negative for activity change, appetite change, fatigue and fever.       The patient is more frail, getting weaker gradually.   HENT: Positive for hearing loss. Negative for congestion and voice change.   Eyes: Negative for visual disturbance.  Respiratory: Negative for cough and shortness of breath.   Cardiovascular: Positive for leg swelling. Negative for chest pain and palpitations.  Gastrointestinal: Negative for abdominal distention, abdominal pain and constipation.       C/o nauseated this morning, but better now, did not vomit, c/o loose stools.   Genitourinary: Negative for difficulty urinating, dysuria and urgency.  Musculoskeletal: Positive for arthralgias, gait problem and myalgias.       Right hip, leg, knee pain. Right shoulder/wrist pain since falls.   Skin: Negative for color change and pallor.  Neurological: Negative for dizziness, speech difficulty, weakness and headaches.       Memory lapses.   Psychiatric/Behavioral: Negative for agitation, behavioral problems and sleep disturbance. The patient is not nervous/anxious.     Immunization  History  Administered Date(s) Administered  . Influenza, High Dose Seasonal PF 07/12/2017, 08/03/2019  . Moderna SARS-COVID-2 Vaccination 10/23/2019, 11/18/2019   Pertinent  Health Maintenance Due  Topic Date Due  . DEXA SCAN  Never done  . PNA vac Low Risk Adult (1 of 2 -  PCV13) Never done  . INFLUENZA VACCINE  Completed   No flowsheet data found. Functional Status Survey:    Vitals:   01/01/20 1037  BP: 138/78  Pulse: 62  Resp: 18  Temp: (!) 97.1 F (36.2 C)  SpO2: 97%  Weight: 129 lb 12.8 oz (58.9 kg)  Height: 4\' 6"  (1.372 m)   Body mass index is 31.3 kg/m. Physical Exam Vitals and nursing note reviewed.  Constitutional:      General: She is not in acute distress.    Appearance: Normal appearance. She is not ill-appearing.  HENT:     Head: Normocephalic and atraumatic.     Nose: Nose normal.     Mouth/Throat:     Mouth: Mucous membranes are moist.  Eyes:     Extraocular Movements: Extraocular movements intact.     Conjunctiva/sclera: Conjunctivae normal.     Pupils: Pupils are equal, round, and reactive to light.  Cardiovascular:     Rate and Rhythm: Normal rate and regular rhythm.     Heart sounds: Murmur present.     Comments: HR in 60 upon my examination Pulmonary:     Breath sounds: Rales present. No wheezing or rhonchi.     Comments: Bibasilar rales.  Abdominal:     General: Bowel sounds are normal. There is no distension.     Tenderness: There is no abdominal tenderness. There is no guarding or rebound.  Musculoskeletal:        General: Tenderness present.     Cervical back: Normal range of motion and neck supple.     Right lower leg: Edema present.     Left lower leg: Edema present.     Comments: 1+ edema BLE, mostly in feet. Pain in right wrist, shoulder, hip with ROM, no redness, swelling, bruise, or warmth noted.   Skin:    General: Skin is warm and dry.  Neurological:     General: No focal deficit present.     Mental Status: She is alert.  Mental status is at baseline.     Motor: No weakness.     Gait: Gait abnormal.     Comments: Oriented to person, place.   Psychiatric:        Mood and Affect: Mood normal.        Behavior: Behavior normal.     Labs reviewed: Recent Labs    11/23/19 0000 12/07/19 0000 12/21/19 0000  NA 140 142 142  K 3.9 4.5 4.3  CL 104 103 103  CO2 29* 31* 32*  BUN 39* 47* 48*  CREATININE 1.3* 1.6* 1.3*  CALCIUM 8.7 9.2 9.5   Recent Labs    03/30/19 0000 11/14/19 0000 11/23/19 0000  AST 20 19 15   ALT 11 15 13   ALKPHOS 92 87 82  ALBUMIN  --  3.4* 3.0*   Recent Labs    02/16/19 0000 02/16/19 0000 02/20/19 0000 03/30/19 0000 11/14/19 0000  WBC 10.8  --   --  7.9 6.5  NEUTROABS  --   --   --   --  3,634  HGB 10.2*  10.2*   < > 9.8* 9.8* 12.3  HCT 31*  31*   < > 29* 30* 38  PLT 387  387   < > 419* 532* 266   < > = values in this interval not displayed.   Lab Results  Component Value Date   TSH 2.600 09/19/2018   No results found for: HGBA1C No results found for:  CHOL, HDL, LDLCALC, LDLDIRECT, TRIG, CHOLHDL  Significant Diagnostic Results in last 30 days:  No results found.  Assessment/Plan: Fall Reported fall the the patient was found lying on the floor on her right side 12/30/19, the patient stated she must have dozed off and fell out of w/c, hit the left face on the trash can. 12/30/19 the patient was assisted with transfer from bed to w/c, the patient's feet began to slip, staff lowered the patient to the floor. 12/31/19 reported fall when the patient was found on the floor lying on the floor again. C/o pain in the right wrist, shoulder, and hip, no apparent deformity, bruise, swelling, or bruise. Will obtain X-ray to r/o fxs. Lack of safety awareness, increased frailty are contributory, close supervision/assistance at higher level facility.   Bradycardia HR in 50s to 60s, may hold Carvedilol if HR<52, may decrease Carvedilol if bradycardia persists.   Cognitive  impairment The patient needs higher level of care, continue Memantine for memory.   Essential hypertension, benign Blood pressure is controlled, continue Amlodipine, Carvedilol.   Persistent atrial fibrillation (HCC) Heart rate is in control, may consider decrease Carvedilol if bradycardia persists.   Right hip pain 3/20  IM pin, 12/25/19 pending Ortho for R hip/knee pain, continue Lyrica, Tylenol. Dc Tramadol per HPOA's request.     Family/ staff Communication: plan of care reviewed with the patient and charge nurse.   Labs/tests ordered:  Xray R wrist, shoulder, and hip  Time spend 40 minutes.

## 2020-01-01 NOTE — Assessment & Plan Note (Signed)
Heart rate is in control, may consider decrease Carvedilol if bradycardia persists.

## 2020-01-01 NOTE — Assessment & Plan Note (Signed)
Blood pressure is controlled, continue Amlodipine, Carvedilol.

## 2020-01-01 NOTE — Assessment & Plan Note (Signed)
The patient needs higher level of care, continue Memantine for memory.

## 2020-01-01 NOTE — Assessment & Plan Note (Signed)
HR in 50s to 60s, may hold Carvedilol if HR<52, may decrease Carvedilol if bradycardia persists.

## 2020-01-01 NOTE — Assessment & Plan Note (Addendum)
Reported fall the the patient was found lying on the floor on her right side 12/30/19, the patient stated she must have dozed off and fell out of w/c, hit the left face on the trash can. 12/30/19 the patient was assisted with transfer from bed to w/c, the patient's feet began to slip, staff lowered the patient to the floor. 12/31/19 reported fall when the patient was found on the floor lying on the floor again. C/o pain in the right wrist, shoulder, and hip, no apparent deformity, bruise, swelling, or bruise. Will obtain X-ray to r/o fxs. Lack of safety awareness, increased frailty are contributory, close supervision/assistance at higher level facility.

## 2020-01-02 ENCOUNTER — Non-Acute Institutional Stay (SKILLED_NURSING_FACILITY): Payer: Medicare PPO | Admitting: Internal Medicine

## 2020-01-02 ENCOUNTER — Encounter: Payer: Self-pay | Admitting: Internal Medicine

## 2020-01-02 DIAGNOSIS — W19XXXD Unspecified fall, subsequent encounter: Secondary | ICD-10-CM | POA: Diagnosis not present

## 2020-01-02 DIAGNOSIS — M25511 Pain in right shoulder: Secondary | ICD-10-CM | POA: Diagnosis not present

## 2020-01-02 DIAGNOSIS — I4819 Other persistent atrial fibrillation: Secondary | ICD-10-CM

## 2020-01-02 DIAGNOSIS — R600 Localized edema: Secondary | ICD-10-CM

## 2020-01-02 DIAGNOSIS — S52601A Unspecified fracture of lower end of right ulna, initial encounter for closed fracture: Secondary | ICD-10-CM

## 2020-01-02 DIAGNOSIS — M25531 Pain in right wrist: Secondary | ICD-10-CM | POA: Diagnosis not present

## 2020-01-02 NOTE — Progress Notes (Signed)
Provider:  Veleta Miners, MD Location:  [facility] Room Number: 6A Place of Service:  SNF (31)  PCP: [name], MD Patient Care Team: [name], MD as PCP - General (Internal Medicine) [name], MD as PCP - Cardiology (Cardiology) [name], MD as Consulting Physician (Internal Medicine) Ngetich, Nelda Bucks, NP as Nurse Practitioner (Family Medicine)  Extended Emergency Contact Information Primary Emergency Contact: Errico,Bob Address: [address]           [place], [place] Phone: [phone] Relation: Son Secondary Emergency Contact: Philmore [place] Phone: [phone] Mobile Phone: [phone] Relation: Daughter  Code Status:  DNR Goals of Care: Advanced Directive information Advanced Directives 01/01/2020  Does Patient Have a Medical Advance Directive? Yes  Type of Paramedic of [place];Living will  Does patient want to make changes to medical advance directive? No - Patient declined  Copy of [place] in Chart? Yes - validated most recent copy scanned in chart (See row information)  Would patient like information on creating a medical advance directive? -  Pre-existing out of facility DNR order (yellow form or pink MOST form) -      Chief Complaint  Patient presents with   New Admit To SNF    New admission to [facility]    HPI: Patient is a 86 y.o. female seen today for admission to  SNF She has a history of Takotsubo's cardiomyopathy with EF of 55 to 42%, diastolic dysfunction, hypertension, PAF not on any anticoagulation, SIADH, depression She has been in SNF since 3/04 after her Right hip fracture.She underwent InterMedullary Implant on 03/04.  Transferring to SNF again from [facility] after sustaining another Fall. Her Xray is positive for displaced fracture of distal right ulnar.  She has  appointment with orthopedics tomorrow.  Continues to have right hip pain.  On tramadol and Lyrica Also continues to have lower extremity swelling.  Due to progressive decline patient's family has decided to move her now back to SNF for long-term care. Patient was comfortable today.  Did not have any other acute issues.  Past Medical History:  Diagnosis Date   Amputation of finger of right hand    Aortic stenosis, mild 03/03/2016   By echo 08/2015   Arthritis    Back pain    Bradycardia 08/28/2015   CKD (chronic kidney disease) stage 3, GFR 30-59 ml/min    Closed right hip fracture (HCC) 12/2018   Depression    GERD (gastroesophageal reflux disease)    GI bleed due to NSAIDs    Hyperlipidemia    Hypertension    Macular degeneration    Maxillary sinusitis    MVP (mitral valve prolapse) 03/03/2016   Bileaflet MVP with mild MR by echo 2016   Old MI (myocardial infarction)    NSTEMI secondary to stress MI/Takotsubo CM, minimal nonobstructive ASCAD at cath   Osteoporosis    Persistent atrial fibrillation ([place]) 2006   not on anticoagulation due to GI bleed and increased fall risk   PMR (polymyalgia rheumatica) (HCC)    SIADH (syndrome of inappropriate ADH production) ([place])    Takotsubo cardiomyopathy    EF normalized to 70%   Ventricular tachycardia ([place])    on initial presentation of MI   Past Surgical History:  Procedure Laterality Date   CARDIAC CATHETERIZATION     normal coronary arteries   CHOLECYSTECTOMY  11/10/2011   Procedure: LAPAROSCOPIC CHOLECYSTECTOMY WITH INTRAOPERATIVE CHOLANGIOGRAM;  Surgeon: Pedro Earls, MD;  Location: [facility];  Service: General;  Laterality: N/A;   EYE SURGERY  06/10/11   membrane peel    INTRAMEDULLARY (IM) NAIL INTERTROCHANTERIC Right 12/21/2018   Procedure: INTRAMEDULLARY (IM) NAIL INTERTROCHANTRIC;  Surgeon: Nicholes Stairs, MD;  Location: [facility];  Service: Orthopedics;  Laterality: Right;   NASAL SINUS  SURGERY Left    ROTATOR CUFF REPAIR  [id number]   UMBILICAL HERNIA REPAIR  11/10/2011   Procedure: HERNIA REPAIR UMBILICAL ADULT;  Surgeon: Pedro Earls, MD;  Location: [facility];  Service: General;  Laterality: N/A;   WISDOM TOOTH EXTRACTION      reports that she has never smoked. She has never used smokeless tobacco. She reports that she does not drink alcohol or use drugs. Social History   Socioeconomic History   Marital status: Married    Spouse name: Not on file   Number of children: Not on file   Years of education: Not on file   Highest education level: Not on file  Occupational History   Not on file  Tobacco Use   Smoking status: Never Smoker   Smokeless tobacco: Never Used  Substance and Sexual Activity   Alcohol use: No    Alcohol/week: 1.0 standard drinks    Types: 1 Glasses of wine per week    Comment: occassionally   Drug use: No   Sexual activity: Not on file  Other Topics Concern   Not on file  Social History Narrative   Not on file   Social Determinants of Health   Financial Resource Strain:    Difficulty of Paying Living Expenses:   Food Insecurity:    Worried About Charity fundraiser in the Last Year:    Arboriculturist in the Last Year:   Transportation Needs:    Film/video editor (Medical):    Lack of Transportation (Non-Medical):   Physical Activity:    Days of Exercise per Week:    Minutes of Exercise per Session:   Stress:    Feeling of Stress :   Social Connections:    Frequency of Communication with Friends and Family:    Frequency of Social Gatherings with Friends and Family:    Attends Religious Services:    Active Member of Clubs or Organizations:    Attends Music therapist:    Marital Status:   Intimate Partner Violence:    Fear of Current or Ex-Partner:    Emotionally Abused:    Physically Abused:    Sexually Abused:     Functional Status Survey:    Family History  Problem  Relation Age of Onset   Cancer Mother     Health Maintenance  Topic Date Due   TETANUS/TDAP  Never done   DEXA SCAN  Never done   PNA vac Low Risk Adult (1 of 2 - PCV13) Never done   INFLUENZA VACCINE  Completed    Allergies  Allergen Reactions   Amlodipine Swelling    To feet and ankles    Avelox [Moxifloxacin Hcl In Nacl] Other (See Comments)    Pt does not remember    Biaxin [Clarithromycin]    Ceftin [Cefuroxime Axetil]    Duragesic Disc Transdermal System [Fentanyl] Other (See Comments)    Reaction unknown   Forteo [Parathyroid Hormone (Recomb)] Other (See Comments)    Made skin dry   Morphine And Related Other (See  Comments)    "My Sister and daughter can,t take it. I've had it before without problems."   Teriparatide    Lidocaine Rash    Outpatient Encounter Medications as of 01/02/2020  Medication Sig   acetaminophen (TYLENOL) 325 MG tablet Take 650 mg by mouth 2 (two) times daily.    acetaminophen (TYLENOL) 500 MG tablet Take 500 mg by mouth every 6 (six) hours as needed. Every 6hrs.   albuterol (PROVENTIL) (2.5 MG/3ML) 0.083% nebulizer solution Take 2.5 mg by nebulization every 6 (six) hours as needed for wheezing or shortness of breath.   Amino Acids-Protein Hydrolys (FEEDING SUPPLEMENT, PRO-STAT SUGAR FREE 64,) LIQD Take 30 mLs by mouth daily.   amLODipine (NORVASC) 5 MG tablet TAKE 1 TABLET BY MOUTH EVERY DAY   azelastine (ASTELIN) 0.1 % nasal spray Place 2 sprays into both nostrils 2 (two) times daily as needed for allergies.    budesonide-formoterol (SYMBICORT) 80-4.5 MCG/ACT inhaler Inhale 2 puffs into the lungs 2 (two) times daily.    buPROPion (WELLBUTRIN SR) 150 MG 12 hr tablet Take 1 tablet (150 mg total) by mouth every morning.   carboxymethylcellul-glycerin (LUBRICATING EYE DROPS) 0.5-0.9 % ophthalmic solution Place 1 drop into both eyes 3 (three) times daily as needed for dry eyes. For allergies   carvedilol (COREG) 12.5 MG  tablet Take 1 tablet (12.5 mg total) by mouth 2 (two) times daily with a meal.   cetirizine (ZYRTEC) 10 MG tablet Take 10 mg by mouth daily.    demeclocycline (DECLOMYCIN) 150 MG tablet Take 150 mg by mouth 2 (two) times daily.    diclofenac sodium (VOLTAREN) 1 % GEL Apply 1 application topically 4 (four) times daily as needed (pain).    famotidine (PEPCID) 20 MG tablet Take 20 mg by mouth daily.   Ferrous Sulfate (IRON) 325 (65 Fe) MG TABS Take 1 tablet by mouth every other day. Give with meals   fluticasone (FLONASE) 50 MCG/ACT nasal spray Place into both nostrils daily. 2 Sprays in both nostrils.   lactose free nutrition (BOOST PLUS) LIQD Take 237 mLs by mouth 2 (two) times daily. Between Meals 10am-6pm.   memantine (NAMENDA) 5 MG tablet Take 5 mg by mouth 2 (two) times daily.    ondansetron (ZOFRAN) 4 MG tablet Take 1 tablet (4 mg total) by mouth every 6 (six) hours as needed for nausea.   pantoprazole (PROTONIX) 40 MG tablet Take 40 mg by mouth daily.   polyethylene glycol (MIRALAX / GLYCOLAX) packet Take 17 g by mouth every other day.    pregabalin (LYRICA) 25 MG capsule Take one capsule by mouth once daily in the morning.   pregabalin (LYRICA) 50 MG capsule Take 1 capsule (50 mg total) by mouth at bedtime.   torsemide (DEMADEX) 20 MG tablet Take 20 mg by mouth once.    zinc oxide 20 % ointment Apply 1 application topically as needed for irritation. To buttocks after every incontinent episode and as needed for redness. May keep at bedside   [DISCONTINUED] traMADol (ULTRAM) 50 MG tablet Take 0.5 tablets (25 mg total) by mouth 2 (two) times daily.   No facility-administered encounter medications on file as of 01/02/2020.    Review of Systems  Constitutional: Negative.   HENT: Positive for postnasal drip and rhinorrhea.   Respiratory: Positive for cough.   Cardiovascular: Positive for leg swelling.  Gastrointestinal: Negative.   Genitourinary: Negative.     Musculoskeletal: Positive for arthralgias, gait problem and myalgias.  Neurological: Positive for weakness.  Psychiatric/Behavioral: Positive for confusion.    Vitals:   01/02/20 1421  BP: 138/78  Pulse: 62  Resp: 18  Temp: (!) 97.1 F (36.2 C)  TempSrc: Oral  SpO2: 97%  Weight: 129 lb 12.8 oz (58.9 kg)  Height: 4\' 6"  (1.372 m)   Body mass index is 31.3 kg/m. Physical Exam Vitals reviewed.  Constitutional:      Appearance: Normal appearance.  HENT:     Head: Normocephalic.     Nose: Nose normal.     Mouth/Throat:     Mouth: Mucous membranes are moist.     Pharynx: Oropharynx is clear.  Eyes:     Pupils: Pupils are equal, round, and reactive to light.  Cardiovascular:     Rate and Rhythm: Normal rate.     Pulses: Normal pulses.  Pulmonary:     Effort: Pulmonary effort is normal.     Breath sounds: Rales present.  Abdominal:     General: Abdomen is flat. Bowel sounds are normal.     Palpations: Abdomen is soft.  Musculoskeletal:        General: Swelling present.     Cervical back: Neck supple.  Skin:    General: Skin is warm.  Neurological:     General: No focal deficit present.     Mental Status: She is alert and oriented to person, place, and time.  Psychiatric:        Mood and Affect: Mood normal.        Thought Content: Thought content normal.        Judgment: Judgment normal.     Labs reviewed: Basic Metabolic Panel: Recent Labs    11/23/19 0000 12/07/19 0000 12/21/19 0000  NA 140 142 142  K 3.9 4.5 4.3  CL 104 103 103  CO2 29* 31* 32*  BUN 39* 47* 48*  CREATININE 1.3* 1.6* 1.3*  CALCIUM 8.7 9.2 9.5   Liver Function Tests: Recent Labs    03/30/19 0000 11/14/19 0000 11/23/19 0000  AST 20 19 15   ALT 11 15 13   ALKPHOS 92 87 82  ALBUMIN  --  3.4* 3.0*   No results for input(s): LIPASE, AMYLASE in the last 8760 hours. No results for input(s): AMMONIA in the last 8760 hours. CBC: Recent Labs    02/16/19 0000 02/16/19 0000  02/20/19 0000 03/30/19 0000 11/14/19 0000  WBC 10.8  --   --  7.9 6.5  NEUTROABS  --   --   --   --  3,634  HGB 10.2*   10.2*   < > 9.8* 9.8* 12.3  HCT 31*   31*   < > 29* 30* 38  PLT 387   387   < > 419* 532* 266   < > = values in this interval not displayed.   Cardiac Enzymes: No results for input(s): CKTOTAL, CKMB, CKMBINDEX, TROPONINI in the last 8760 hours. BNP: Invalid input(s): POCBNP No results found for: HGBA1C Lab Results  Component Value Date   TSH 2.600 09/19/2018   No results found for: VITAMINB12 No results found for: FOLATE Lab Results  Component Value Date   IRON 26 (L) 04/28/2007   TIBC 178 (L) 04/28/2007   FERRITIN 208 04/28/2007    Imaging and Procedures obtained prior to SNF admission: DG Chest 1 View  Result Date: 12/21/2018 CLINICAL DATA:  Leg pain EXAM: CHEST  1 VIEW COMPARISON:  07/20/2014 FINDINGS: No acute consolidation or effusion. Mild cardiomegaly with aortic atherosclerosis. No pneumothorax. IMPRESSION:  No active disease.  Mild cardiomegaly Electronically Signed   By: Donavan Foil M.D.   On: 12/21/2018 01:25   DG Lumbar Spine Complete  Result Date: 12/21/2018 CLINICAL DATA:  Leg pain EXAM: LUMBAR SPINE - COMPLETE 4+ VIEW COMPARISON:  MRI 01/02/2007 FINDINGS: Dense aortic atherosclerosis. Study limited by scoliosis and osteopenia. Multiple treated compression fractures at L3, T12 and L1 with additional treated compression deformities at the lower thoracic spine. Moderate severe compression fracture T11, chronic. Advanced degenerative change at L4-L5 and L5-S1. Moderate severe degenerative change L3-L4. IMPRESSION: 1. Limited study secondary to scoliosis and osteopenia. 2. Multiple treated compression fractures of the thoracolumbar spine with multiple level degenerative changes, most marked at L3 through S1. No gross acute osseous abnormality. Electronically Signed   By: Donavan Foil M.D.   On: 12/21/2018 01:30   CT Head Wo Contrast  Result Date:  12/21/2018 CLINICAL DATA:  Fall with head trauma EXAM: CT HEAD WITHOUT CONTRAST CT CERVICAL SPINE WITHOUT CONTRAST TECHNIQUE: Multidetector CT imaging of the head and cervical spine was performed following the standard protocol without intravenous contrast. Multiplanar CT image reconstructions of the cervical spine were also generated. COMPARISON:  Head CT 11/14/2005 FINDINGS: CT HEAD FINDINGS Brain: There is no mass, hemorrhage or extra-axial collection. There is generalized atrophy without lobar predilection. Areas of hypoattenuation of the deep gray nuclei and confluent periventricular white matter hypodensity, consistent with chronic small vessel disease. Old left basal ganglia lacunar infarct. Vascular: Atherosclerotic calcification of the internal carotid arteries at the skull base. No abnormal hyperdensity of the major intracranial arteries or dural venous sinuses. Skull: Right frontoparietal scalp hematoma.  No skull fracture. Sinuses/Orbits: No fluid levels or advanced mucosal thickening of the visualized paranasal sinuses. No mastoid or middle ear effusion. The orbits are normal. CT CERVICAL SPINE FINDINGS Alignment: Grade 1 anterolisthesis at C3-4 and C4-5, likely secondary to facet arthrosis. Skull base and vertebrae: No acute fracture. Soft tissues and spinal canal: No prevertebral fluid or swelling. No visible canal hematoma. Disc levels: There is severe overgrowth at the right atlantoaxial joint with a large anterior osteophyte. Hypertrophy and calcific fragments surround the odontoid process. There is multilevel severe facet hypertrophy. No bony spinal canal stenosis. Severe left C3-4, left C4-5, right C5-6 neural foraminal stenosis. Upper chest: No pneumothorax, pulmonary nodule or pleural effusion. Other: Normal visualized paraspinal cervical soft tissues. IMPRESSION: 1. No acute intracranial abnormality. Findings of chronic small vessel disease and generalized atrophy. 2. Right frontoparietal scalp  hematoma without skull fracture. 3. No acute fracture of the cervical spine. 4. Severe chronic atlantoaxial degenerative change. 5. Multilevel severe facet arthrosis with associated foraminal stenosis. Electronically Signed   By: Ulyses Jarred M.D.   On: 12/21/2018 01:19   CT Cervical Spine Wo Contrast  Result Date: 12/21/2018 CLINICAL DATA:  Fall with head trauma EXAM: CT HEAD WITHOUT CONTRAST CT CERVICAL SPINE WITHOUT CONTRAST TECHNIQUE: Multidetector CT imaging of the head and cervical spine was performed following the standard protocol without intravenous contrast. Multiplanar CT image reconstructions of the cervical spine were also generated. COMPARISON:  Head CT 11/14/2005 FINDINGS: CT HEAD FINDINGS Brain: There is no mass, hemorrhage or extra-axial collection. There is generalized atrophy without lobar predilection. Areas of hypoattenuation of the deep gray nuclei and confluent periventricular white matter hypodensity, consistent with chronic small vessel disease. Old left basal ganglia lacunar infarct. Vascular: Atherosclerotic calcification of the internal carotid arteries at the skull base. No abnormal hyperdensity of the major intracranial arteries or dural venous sinuses. Skull:  Right frontoparietal scalp hematoma.  No skull fracture. Sinuses/Orbits: No fluid levels or advanced mucosal thickening of the visualized paranasal sinuses. No mastoid or middle ear effusion. The orbits are normal. CT CERVICAL SPINE FINDINGS Alignment: Grade 1 anterolisthesis at C3-4 and C4-5, likely secondary to facet arthrosis. Skull base and vertebrae: No acute fracture. Soft tissues and spinal canal: No prevertebral fluid or swelling. No visible canal hematoma. Disc levels: There is severe overgrowth at the right atlantoaxial joint with a large anterior osteophyte. Hypertrophy and calcific fragments surround the odontoid process. There is multilevel severe facet hypertrophy. No bony spinal canal stenosis. Severe left C3-4,  left C4-5, right C5-6 neural foraminal stenosis. Upper chest: No pneumothorax, pulmonary nodule or pleural effusion. Other: Normal visualized paraspinal cervical soft tissues. IMPRESSION: 1. No acute intracranial abnormality. Findings of chronic small vessel disease and generalized atrophy. 2. Right frontoparietal scalp hematoma without skull fracture. 3. No acute fracture of the cervical spine. 4. Severe chronic atlantoaxial degenerative change. 5. Multilevel severe facet arthrosis with associated foraminal stenosis. Electronically Signed   By: Ulyses Jarred M.D.   On: 12/21/2018 01:19   DG C-Arm 1-60 Min  Result Date: 12/21/2018 CLINICAL DATA:  Operative fixation of a right intertrochanteric fracture. EXAM: DG C-ARM 61-120 MIN; OPERATIVE RIGHT HIP WITH PELVIS COMPARISON:  Earlier today. FINDINGS: Interval rod and screw fixation of the previously demonstrated right intertrochanteric fracture with significantly improved position and alignment. No significant displacement or angulation seen at this time. No new fractures. IMPRESSION: Hardware fixation of the previously demonstrated right intertrochanteric fracture with significantly improved position and alignment. Electronically Signed   By: Claudie Revering M.D.   On: 12/21/2018 17:37   DG HIP OPERATIVE UNILAT W OR W/O PELVIS RIGHT  Result Date: 12/21/2018 CLINICAL DATA:  Operative fixation of a right intertrochanteric fracture. EXAM: DG C-ARM 61-120 MIN; OPERATIVE RIGHT HIP WITH PELVIS COMPARISON:  Earlier today. FINDINGS: Interval rod and screw fixation of the previously demonstrated right intertrochanteric fracture with significantly improved position and alignment. No significant displacement or angulation seen at this time. No new fractures. IMPRESSION: Hardware fixation of the previously demonstrated right intertrochanteric fracture with significantly improved position and alignment. Electronically Signed   By: Claudie Revering M.D.   On: 12/21/2018 17:37    DG Hip Unilat W or Wo Pelvis 2-3 Views Right  Result Date: 12/21/2018 CLINICAL DATA:  Leg pain EXAM: DG HIP (WITH OR WITHOUT PELVIS) 2-3V RIGHT COMPARISON:  None. FINDINGS: SI joints are non widened. Pubic symphysis appears intact. Probable chronic fracture deformity of the left inferior pubic ramus. Acute comminuted displaced right intertrochanteric fracture. Right femoral head projects in joint. The shaft of the femur is migrated superiorly. There are vascular calcifications. IMPRESSION: 1. Acute comminuted and displaced right intertrochanteric fracture 2. Probable chronic fracture of the left inferior pubic ramus Electronically Signed   By: Donavan Foil M.D.   On: 12/21/2018 01:26    Assessment/Plan  Closed fracture of distal end of right ulna, unspecified fracture morphology, initial encounter Has the brace on Follow-up with Ortho tomorrow Fall, subsequent encounter Transfer to higher level of care  Bilateral edema of lower extremity Continue on Demadex Also continue TED hoses BUN 48 creatinine 1.3  Right Hip Pain Repeat x-ray is negative Does have follow-up with Ortho  CKD Stage 3b BUN/creatinine are stable Persistent atrial fibrillation (HCC) On low-dose of Coreg No anticoagulation due to history of falls Have some episodes in which the heart rate was less then 60.  If persist we  will decrease the dose of Coreg History of SIADH Continue on demeclocycline Anemia HGB is stable on iron Allergic rhinitis On Astelin spray and Claritin COPD Stable on Symbicort Depression It is really stable on Wellbutrin   Family/ staff Communication:   Labs/tests ordered:Total time spent in this patient care encounter was  45_  minutes; greater than 50% of the visit spent counseling patient and staff, reviewing records , Labs and coordinating care for problems addressed at this encounter.

## 2020-01-03 ENCOUNTER — Encounter: Payer: Self-pay | Admitting: Nurse Practitioner

## 2020-01-03 ENCOUNTER — Other Ambulatory Visit: Payer: Self-pay | Admitting: *Deleted

## 2020-01-03 DIAGNOSIS — M25531 Pain in right wrist: Secondary | ICD-10-CM | POA: Diagnosis not present

## 2020-01-03 DIAGNOSIS — S62101A Fracture of unspecified carpal bone, right wrist, initial encounter for closed fracture: Secondary | ICD-10-CM | POA: Insufficient documentation

## 2020-01-03 DIAGNOSIS — Z20828 Contact with and (suspected) exposure to other viral communicable diseases: Secondary | ICD-10-CM | POA: Diagnosis not present

## 2020-01-03 MED ORDER — PREGABALIN 25 MG PO CAPS: ORAL_CAPSULE | ORAL | 0 refills | Status: DC

## 2020-01-03 NOTE — Telephone Encounter (Signed)
Received fax from [facility] Rx and sent to Dr. Lyndel Safe for approval.

## 2020-01-04 DIAGNOSIS — M79604 Pain in right leg: Secondary | ICD-10-CM | POA: Diagnosis not present

## 2020-01-04 DIAGNOSIS — H3530 Unspecified macular degeneration: Secondary | ICD-10-CM | POA: Diagnosis not present

## 2020-01-04 DIAGNOSIS — R2681 Unsteadiness on feet: Secondary | ICD-10-CM | POA: Diagnosis not present

## 2020-01-04 DIAGNOSIS — R29898 Other symptoms and signs involving the musculoskeletal system: Secondary | ICD-10-CM | POA: Diagnosis not present

## 2020-01-04 DIAGNOSIS — M6281 Muscle weakness (generalized): Secondary | ICD-10-CM | POA: Diagnosis not present

## 2020-01-04 DIAGNOSIS — R296 Repeated falls: Secondary | ICD-10-CM | POA: Diagnosis not present

## 2020-01-04 DIAGNOSIS — Z9181 History of falling: Secondary | ICD-10-CM | POA: Diagnosis not present

## 2020-01-04 DIAGNOSIS — M25561 Pain in right knee: Secondary | ICD-10-CM | POA: Diagnosis not present

## 2020-01-04 DIAGNOSIS — M25551 Pain in right hip: Secondary | ICD-10-CM | POA: Diagnosis not present

## 2020-01-08 ENCOUNTER — Encounter: Payer: Self-pay | Admitting: Internal Medicine

## 2020-01-08 ENCOUNTER — Non-Acute Institutional Stay (SKILLED_NURSING_FACILITY): Payer: Medicare PPO | Admitting: Internal Medicine

## 2020-01-08 DIAGNOSIS — M25551 Pain in right hip: Secondary | ICD-10-CM | POA: Diagnosis not present

## 2020-01-08 DIAGNOSIS — R29898 Other symptoms and signs involving the musculoskeletal system: Secondary | ICD-10-CM | POA: Diagnosis not present

## 2020-01-08 DIAGNOSIS — E222 Syndrome of inappropriate secretion of antidiuretic hormone: Secondary | ICD-10-CM

## 2020-01-08 DIAGNOSIS — S52601S Unspecified fracture of lower end of right ulna, sequela: Secondary | ICD-10-CM | POA: Diagnosis not present

## 2020-01-08 DIAGNOSIS — D649 Anemia, unspecified: Secondary | ICD-10-CM | POA: Diagnosis not present

## 2020-01-08 DIAGNOSIS — R2681 Unsteadiness on feet: Secondary | ICD-10-CM | POA: Diagnosis not present

## 2020-01-08 DIAGNOSIS — Z9181 History of falling: Secondary | ICD-10-CM | POA: Diagnosis not present

## 2020-01-08 DIAGNOSIS — N1832 Chronic kidney disease, stage 3b: Secondary | ICD-10-CM

## 2020-01-08 DIAGNOSIS — M6281 Muscle weakness (generalized): Secondary | ICD-10-CM | POA: Diagnosis not present

## 2020-01-08 DIAGNOSIS — H3530 Unspecified macular degeneration: Secondary | ICD-10-CM | POA: Diagnosis not present

## 2020-01-08 DIAGNOSIS — M79604 Pain in right leg: Secondary | ICD-10-CM | POA: Diagnosis not present

## 2020-01-08 DIAGNOSIS — R296 Repeated falls: Secondary | ICD-10-CM | POA: Diagnosis not present

## 2020-01-08 MED ORDER — OXYCODONE HCL 5 MG PO TABS: 2.5000 mg | ORAL_TABLET | Freq: Two times a day (BID) | ORAL | 0 refills | Status: DC | PRN

## 2020-01-08 NOTE — Progress Notes (Signed)
Location:    [facility] Room Number: 6 Place of Service:  SNF [phone]) Provider:  [name], MD  [name], MD  Patient Care Team: [name], MD as PCP - General (Internal Medicine) [name], MD as PCP - Cardiology (Cardiology) [name], MD as Consulting Physician (Internal Medicine) Ngetich, Nelda Bucks, NP as Nurse Practitioner (Family Medicine)  Extended Emergency Contact Information Primary Emergency Contact: Spisak,Bob Address: [address]           [place], [place] Phone: [phone] Relation: Son Secondary Emergency Contact: Philmore [place] of [place] Phone: [phone] Mobile Phone: [phone] Relation: Daughter  Code Status:  DNR Goals of care: Advanced Directive information Advanced Directives 01/01/2020  Does Patient Have a Medical Advance Directive? Yes  Type of Paramedic of [place];Living will  Does patient want to make changes to medical advance directive? No - Patient declined  Copy of [place] in Chart? Yes - validated most recent copy scanned in chart (See row information)  Would patient like information on creating a medical advance directive? -  Pre-existing out of facility DNR order (yellow form or pink MOST form) -     Chief Complaint  Patient presents with  . Acute Visit    Pain    HPI:  Pt is a 86 y.o. female seen today for an acute visit for C/O Pain in her LE   She has a history of Takotsubo's cardiomyopathy with EF of 55 to 86%, diastolic dysfunction, hypertension, PAF not on any anticoagulation, SIADH, depression and S/P Right Hip Fracture underwent InterMedullary Implant on 03/04. And recent displaced fracture of distal right ulnar Due to Fall She is transferred [facility] due to recurrent falls.  Patient has been on Lyrica and tramadol but she said the tramadol did not  help there was just taking Tylenol for her pain.  She states that the pain has gotten worse again.  The Tylenol does not help.  States that the pain is mostly at night.  Her pain is in both her legs right more than left.  Also pain in right hip.  Her x-rays in the past have been negative.  She said that she went to see Ortho and injected her right knee.  I do not have follow-up notes yet from them. She has a brace on her right hand due to the fracture.  Per Ortho notes she is supposed to keep the brace for 2 more weeks.   Past Medical History:  Diagnosis Date  . Amputation of finger of right hand   . Aortic stenosis, mild 03/03/2016   By echo 08/2015  . Arthritis   . Back pain   . Bradycardia 08/28/2015  . CKD (chronic kidney disease) stage 3, GFR 30-59 ml/min   . Closed right hip fracture ([place]) 12/2018  . Depression   . GERD (gastroesophageal reflux disease)   . GI bleed due to NSAIDs   . Hyperlipidemia   . Hypertension   . Macular degeneration   . Maxillary sinusitis   . MVP (mitral valve prolapse) 03/03/2016   Bileaflet MVP with mild MR by echo 2016  . Old MI (myocardial infarction)    NSTEMI secondary to stress MI/Takotsubo CM, minimal nonobstructive ASCAD at cath  . Osteoporosis   . Persistent atrial fibrillation ([place]) 2006   not on anticoagulation due to GI bleed  and increased fall risk  . PMR (polymyalgia rheumatica) (HCC)   . SIADH (syndrome of inappropriate ADH production) ([place])   . Takotsubo cardiomyopathy    EF normalized to 70%  . Ventricular tachycardia ([place])    on initial presentation of MI   Past Surgical History:  Procedure Laterality Date  . CARDIAC CATHETERIZATION     normal coronary arteries  . CHOLECYSTECTOMY  11/10/2011   Procedure: LAPAROSCOPIC CHOLECYSTECTOMY WITH INTRAOPERATIVE CHOLANGIOGRAM;  Surgeon: Pedro Earls, MD;  Location: [facility];  Service: General;  Laterality: N/A;  . EYE SURGERY  06/10/11   membrane peel   . INTRAMEDULLARY (IM) NAIL  INTERTROCHANTERIC Right 12/21/2018   Procedure: INTRAMEDULLARY (IM) NAIL INTERTROCHANTRIC;  Surgeon: Nicholes Stairs, MD;  Location: [facility];  Service: Orthopedics;  Laterality: Right;  . NASAL SINUS SURGERY Left   . ROTATOR CUFF REPAIR  1998  . UMBILICAL HERNIA REPAIR  11/10/2011   Procedure: HERNIA REPAIR UMBILICAL ADULT;  Surgeon: Pedro Earls, MD;  [facility];  Service: General;  Laterality: N/A;  . WISDOM TOOTH EXTRACTION      Allergies  Allergen Reactions  . Amlodipine Swelling    To feet and ankles   . Avelox [Moxifloxacin Hcl In Nacl] Other (See Comments)    Pt does not remember   . Biaxin [Clarithromycin]   . Ceftin [Cefuroxime Axetil]   . Duragesic Disc Transdermal System [Fentanyl] Other (See Comments)    Reaction unknown  . Forteo [Parathyroid Hormone (Recomb)] Other (See Comments)    Made skin dry  . Morphine And Related Other (See Comments)    "My Sister and daughter can,t take it. I've had it before without problems."  . Teriparatide   . Lidocaine Rash    Allergies as of 01/08/2020      Reactions   Amlodipine Swelling   To feet and ankles    Avelox [moxifloxacin Hcl In Nacl] Other (See Comments)   Pt does not remember   Biaxin [clarithromycin]    Ceftin [cefuroxime Axetil]    Duragesic Disc Transdermal System [fentanyl] Other (See Comments)   Reaction unknown   Forteo [parathyroid Hormone (recomb)] Other (See Comments)   Made skin dry   Morphine And Related Other (See Comments)   "My Sister and daughter can,t take it. I've had it before without problems."   Teriparatide    Lidocaine Rash      Medication List       Accurate as of January 08, 2020 10:29 AM. If you have any questions, ask your nurse or doctor.        acetaminophen 500 MG tablet Commonly known as: TYLENOL Take 500 mg by mouth every 6 (six) hours as needed. Every 6hrs.   acetaminophen 325 MG tablet Commonly known as: TYLENOL Take 650 mg by mouth 2 (two) times daily.     albuterol (2.5 MG/3ML) 0.083% nebulizer solution Commonly known as: PROVENTIL Take 2.5 mg by nebulization every 6 (six) hours as needed for wheezing or shortness of breath.   amLODipine 5 MG tablet Commonly known as: NORVASC TAKE 1 TABLET BY MOUTH EVERY DAY   azelastine 0.1 % nasal spray Commonly known as: ASTELIN Place 2 sprays into both nostrils 2 (two) times daily as needed for allergies.   budesonide-formoterol 80-4.5 MCG/ACT inhaler Commonly known as: SYMBICORT Inhale 2 puffs into the lungs 2 (two) times daily.   buPROPion 150 MG 12 hr tablet Commonly known as: WELLBUTRIN SR Take 1 tablet (150 mg total) by mouth  every morning.   carvedilol 12.5 MG tablet Commonly known as: COREG Take 1 tablet (12.5 mg total) by mouth 2 (two) times daily with a meal.   cetirizine 10 MG tablet Commonly known as: ZYRTEC Take 10 mg by mouth daily.   demeclocycline 150 MG tablet Commonly known as: DECLOMYCIN Take 150 mg by mouth 2 (two) times daily.   diclofenac sodium 1 % Gel Commonly known as: VOLTAREN Apply 1 application topically 4 (four) times daily as needed (pain).   famotidine 20 MG tablet Commonly known as: PEPCID Take 20 mg by mouth daily.   feeding supplement (PRO-STAT SUGAR FREE 64) Liqd Take 30 mLs by mouth daily.   fluticasone 50 MCG/ACT nasal spray Commonly known as: FLONASE Place into both nostrils daily. 2 Sprays in both nostrils.   Iron 325 (65 Fe) MG Tabs Take 1 tablet by mouth every other day. Give with meals   lactose free nutrition Liqd Take 237 mLs by mouth 2 (two) times daily. Between Meals 10am-6pm.   Lubricating Eye Drops 0.5-0.9 % ophthalmic solution Generic drug: carboxymethylcellul-glycerin Place 1 drop into both eyes 3 (three) times daily as needed for dry eyes. For allergies   memantine 5 MG tablet Commonly known as: NAMENDA Take 5 mg by mouth 2 (two) times daily.   ondansetron 4 MG tablet Commonly known as: ZOFRAN Take 1 tablet (4 mg  total) by mouth every 6 (six) hours as needed for nausea.   pantoprazole 40 MG tablet Commonly known as: PROTONIX Take 40 mg by mouth daily.   polyethylene glycol 17 g packet Commonly known as: MIRALAX / GLYCOLAX Take 17 g by mouth every other day.   pregabalin 50 MG capsule Commonly known as: LYRICA Take 1 capsule (50 mg total) by mouth at bedtime.   pregabalin 25 MG capsule Commonly known as: LYRICA Take one capsule by mouth once daily in the morning.   torsemide 20 MG tablet Commonly known as: DEMADEX Take 20 mg by mouth once.   zinc oxide 20 % ointment Apply 1 application topically as needed for irritation. To buttocks after every incontinent episode and as needed for redness. May keep at bedside       Review of Systems  Review of Systems  Constitutional: Negative for activity change, appetite change, chills, diaphoresis, fatigue and fever.  HENT: Negative for mouth sores, postnasal drip, rhinorrhea, sinus pain and sore throat.   Respiratory: Negative for apnea, cough, chest tightness, shortness of breath and wheezing.   Cardiovascular: Negative for chest pain, palpitations and leg swelling.  Gastrointestinal: Negative for abdominal distention, abdominal pain, constipation, diarrhea, nausea and vomiting.  Genitourinary: Negative for dysuria and frequency.  Musculoskeletal: Positive  for arthralgias, joint swelling and myalgias.  Skin: Negative for rash.  Neurological: Negative for dizziness, syncope, weakness, light-headedness and numbness.  Psychiatric/Behavioral: Negative for behavioral problems, confusion and sleep disturbance.    Immunization History  Administered Date(s) Administered  . Influenza, High Dose Seasonal PF 07/12/2017, 08/03/2019  . Moderna SARS-COVID-2 Vaccination 10/23/2019, 11/18/2019   Pertinent  Health Maintenance Due  Topic Date Due  . DEXA SCAN  Never done  . PNA vac Low Risk Adult (1 of 2 - PCV13) Never done  . INFLUENZA VACCINE   Completed   No flowsheet data found. Functional Status Survey:    Vitals:   01/08/20 1022  BP: 126/80  Pulse: 64  Resp: 18  Temp: 97.9 F (36.6 C)  SpO2: 96%  Weight: 128 lb 3.2 oz (58.2 kg)  Height:  4\' 6"  (1.372 m)   Body mass index is 30.91 kg/m. Physical Exam  Constitutional: Oriented to person, place, and time. Well-developed and well-nourished.  HENT:  Head: Normocephalic.  Mouth/Throat: Oropharynx is clear and moist.  Eyes: Pupils are equal, round, and reactive to light.  Neck: Neck supple.  Cardiovascular: Normal rate and normal heart sounds.  No murmur heard. Pulmonary/Chest: Effort normal and breath sounds normal. Rales Bilateral  .Abdominal: Soft. Bowel sounds are normal. No distension. There is no tenderness. There is no rebound.  Musculoskeletal: Mild Edema Bilateral Lymphadenopathy: none Neurological: Alert and oriented to person, place, and time.  Skin: Skin is warm and dry.  Psychiatric: Normal mood and affect. Behavior is normal. Thought content normal.    Labs reviewed: Recent Labs    12/07/19 0000 12/21/19 0000 12/26/19 0000  NA 142 142 144  K 4.5 4.3 4.0  CL 103 103 106  CO2 31* 32* 29*  BUN 47* 48* 50*  CREATININE 1.6* 1.3* 1.3*  CALCIUM 9.2 9.5 9.0   Recent Labs    11/14/19 0000 11/23/19 0000 12/26/19 0000  AST 19 15 16   ALT 15 13 15   ALKPHOS 87 82 108  ALBUMIN 3.4* 3.0* 3.0*   Recent Labs    03/30/19 0000 11/14/19 0000 12/26/19 0000  WBC 7.9 6.5 6.8  NEUTROABS  --  3,634 3,903  HGB 9.8* 12.3 11.5*  HCT 30* 38 35*  PLT 532* 266 287   Lab Results  Component Value Date   TSH 2.600 09/19/2018   No results found for: HGBA1C No results found for: CHOL, HDL, LDLCALC, LDLDIRECT, TRIG, CHOLHDL  Significant Diagnostic Results in last 30 days:  No results found.  Assessment/Plan  Closed fracture of distal end of right ulna, unspecified fracture morphology, sequela Per ortho Splint for 2 weeks Will order Oxycodone 2.5 mg  BID PRN for 2 weeks Morphine allergy very low and she has had Norco before with no issues  Right hip pain Right knee Injected Follow up with Ortho if needed  SIADH (syndrome of inappropriate ADH production) (HCC) Edema stable on demeclocycline Anemia, Hemoglobin is stable on iron   Family/ staff Communication:   Labs/tests ordered:   Total time spent in this patient care encounter was  25_  minutes; greater than 50% of the visit spent counseling patient and staff, reviewing records , Labs and coordinating care for problems addressed at this encounter.

## 2020-01-09 DIAGNOSIS — M6281 Muscle weakness (generalized): Secondary | ICD-10-CM | POA: Diagnosis not present

## 2020-01-09 DIAGNOSIS — H3530 Unspecified macular degeneration: Secondary | ICD-10-CM | POA: Diagnosis not present

## 2020-01-09 DIAGNOSIS — R29898 Other symptoms and signs involving the musculoskeletal system: Secondary | ICD-10-CM | POA: Diagnosis not present

## 2020-01-09 DIAGNOSIS — R2681 Unsteadiness on feet: Secondary | ICD-10-CM | POA: Diagnosis not present

## 2020-01-09 DIAGNOSIS — Z9181 History of falling: Secondary | ICD-10-CM | POA: Diagnosis not present

## 2020-01-09 DIAGNOSIS — M79604 Pain in right leg: Secondary | ICD-10-CM | POA: Diagnosis not present

## 2020-01-09 DIAGNOSIS — R296 Repeated falls: Secondary | ICD-10-CM | POA: Diagnosis not present

## 2020-01-10 DIAGNOSIS — Z9181 History of falling: Secondary | ICD-10-CM | POA: Diagnosis not present

## 2020-01-10 DIAGNOSIS — H3530 Unspecified macular degeneration: Secondary | ICD-10-CM | POA: Diagnosis not present

## 2020-01-10 DIAGNOSIS — M79604 Pain in right leg: Secondary | ICD-10-CM | POA: Diagnosis not present

## 2020-01-10 DIAGNOSIS — R296 Repeated falls: Secondary | ICD-10-CM | POA: Diagnosis not present

## 2020-01-10 DIAGNOSIS — M6281 Muscle weakness (generalized): Secondary | ICD-10-CM | POA: Diagnosis not present

## 2020-01-10 DIAGNOSIS — R2681 Unsteadiness on feet: Secondary | ICD-10-CM | POA: Diagnosis not present

## 2020-01-10 DIAGNOSIS — R29898 Other symptoms and signs involving the musculoskeletal system: Secondary | ICD-10-CM | POA: Diagnosis not present

## 2020-01-11 DIAGNOSIS — M6281 Muscle weakness (generalized): Secondary | ICD-10-CM | POA: Diagnosis not present

## 2020-01-11 DIAGNOSIS — Z9181 History of falling: Secondary | ICD-10-CM | POA: Diagnosis not present

## 2020-01-11 DIAGNOSIS — R296 Repeated falls: Secondary | ICD-10-CM | POA: Diagnosis not present

## 2020-01-11 DIAGNOSIS — H3530 Unspecified macular degeneration: Secondary | ICD-10-CM | POA: Diagnosis not present

## 2020-01-11 DIAGNOSIS — R29898 Other symptoms and signs involving the musculoskeletal system: Secondary | ICD-10-CM | POA: Diagnosis not present

## 2020-01-11 DIAGNOSIS — M79604 Pain in right leg: Secondary | ICD-10-CM | POA: Diagnosis not present

## 2020-01-11 DIAGNOSIS — R2681 Unsteadiness on feet: Secondary | ICD-10-CM | POA: Diagnosis not present

## 2020-01-12 DIAGNOSIS — H3530 Unspecified macular degeneration: Secondary | ICD-10-CM | POA: Diagnosis not present

## 2020-01-12 DIAGNOSIS — R296 Repeated falls: Secondary | ICD-10-CM | POA: Diagnosis not present

## 2020-01-12 DIAGNOSIS — M6281 Muscle weakness (generalized): Secondary | ICD-10-CM | POA: Diagnosis not present

## 2020-01-12 DIAGNOSIS — R29898 Other symptoms and signs involving the musculoskeletal system: Secondary | ICD-10-CM | POA: Diagnosis not present

## 2020-01-12 DIAGNOSIS — Z9181 History of falling: Secondary | ICD-10-CM | POA: Diagnosis not present

## 2020-01-12 DIAGNOSIS — R2681 Unsteadiness on feet: Secondary | ICD-10-CM | POA: Diagnosis not present

## 2020-01-12 DIAGNOSIS — M79604 Pain in right leg: Secondary | ICD-10-CM | POA: Diagnosis not present

## 2020-01-15 DIAGNOSIS — R296 Repeated falls: Secondary | ICD-10-CM | POA: Diagnosis not present

## 2020-01-15 DIAGNOSIS — R2681 Unsteadiness on feet: Secondary | ICD-10-CM | POA: Diagnosis not present

## 2020-01-15 DIAGNOSIS — M79604 Pain in right leg: Secondary | ICD-10-CM | POA: Diagnosis not present

## 2020-01-15 DIAGNOSIS — M6281 Muscle weakness (generalized): Secondary | ICD-10-CM | POA: Diagnosis not present

## 2020-01-15 DIAGNOSIS — R29898 Other symptoms and signs involving the musculoskeletal system: Secondary | ICD-10-CM | POA: Diagnosis not present

## 2020-01-15 DIAGNOSIS — H3530 Unspecified macular degeneration: Secondary | ICD-10-CM | POA: Diagnosis not present

## 2020-01-15 DIAGNOSIS — Z9181 History of falling: Secondary | ICD-10-CM | POA: Diagnosis not present

## 2020-01-16 ENCOUNTER — Encounter: Payer: Self-pay | Admitting: Internal Medicine

## 2020-01-16 ENCOUNTER — Non-Acute Institutional Stay (SKILLED_NURSING_FACILITY): Payer: Medicare PPO | Admitting: Internal Medicine

## 2020-01-16 DIAGNOSIS — R2681 Unsteadiness on feet: Secondary | ICD-10-CM | POA: Diagnosis not present

## 2020-01-16 DIAGNOSIS — R296 Repeated falls: Secondary | ICD-10-CM | POA: Diagnosis not present

## 2020-01-16 DIAGNOSIS — T148XXA Other injury of unspecified body region, initial encounter: Secondary | ICD-10-CM | POA: Diagnosis not present

## 2020-01-16 DIAGNOSIS — H3530 Unspecified macular degeneration: Secondary | ICD-10-CM | POA: Diagnosis not present

## 2020-01-16 DIAGNOSIS — Z9181 History of falling: Secondary | ICD-10-CM | POA: Diagnosis not present

## 2020-01-16 DIAGNOSIS — F4321 Adjustment disorder with depressed mood: Secondary | ICD-10-CM | POA: Diagnosis not present

## 2020-01-16 DIAGNOSIS — R29898 Other symptoms and signs involving the musculoskeletal system: Secondary | ICD-10-CM | POA: Diagnosis not present

## 2020-01-16 DIAGNOSIS — M79604 Pain in right leg: Secondary | ICD-10-CM | POA: Diagnosis not present

## 2020-01-16 DIAGNOSIS — M6281 Muscle weakness (generalized): Secondary | ICD-10-CM | POA: Diagnosis not present

## 2020-01-16 NOTE — Progress Notes (Signed)
Location:   [facility] Room Number: 6 Place of Service:  SNF [phone]) Provider:  Veleta Miners, MD  [name], MD  Patient Care Team: [name], MD as PCP - General (Internal Medicine) [name], MD as PCP - Cardiology (Cardiology) [name], MD as Consulting Physician (Internal Medicine) Ngetich, Nelda Bucks, NP as Nurse Practitioner (Family Medicine)  Extended Emergency Contact Information Primary Emergency Contact: Lorence,Bob Address: [address]           [place], [place] Phone: [phone] Relation: Son Secondary Emergency Contact: Philmore Pal[place] of [place] Phone: [phone] Mobile Phone: [phone] Relation: Daughter  Code Status:  DNR Goals of care: Advanced Directive information Advanced Directives 01/16/2020  Does Patient Have a Medical Advance Directive? Yes  Type of Paramedic of [place];Living will;Out of facility DNR (pink MOST or yellow form)  Does patient want to make changes to medical advance directive? No - Patient declined  Copy of [place] in Chart? Yes - validated most recent copy scanned in chart (See row information)  Would patient like information on creating a medical advance directive? -  Pre-existing out of facility DNR order (yellow form or pink MOST form) -     Chief Complaint  Patient presents with  . Acute Visit    Depression    HPI:  Pt is a 86 y.o. female seen today for an acute visit for   She has a history of Takotsubo's cardiomyopathy with EF of 55 to 39%, diastolic dysfunction, hypertension, PAF not on any anticoagulation, SIADH, depression and S/P Right Hip Fracture underwent InterMedullary Implant on 03/04. And recent displaced fracture of distal right ulnar Due to Fall She is transferred [facility]F from [facility] due to recurrent falls.  Seen today for  Depression and Anxiety Patient  was recently seen by nurses crying.  Patient said that her daughter Evicted from her home due to her brother.  And she feels helpless Her appetite is okay and she is sleeping okay at night just has episodes of anxiety Bruise below the right breast Just wanted me to evaluate a new bruise below her right breast thought to be due to her clothes Past Medical History:  Diagnosis Date  . Amputation of finger of right hand   . Aortic stenosis, mild 03/03/2016   By echo 08/2015  . Arthritis   . Back pain   . Bradycardia 08/28/2015  . CKD (chronic kidney disease) stage 3, GFR 30-59 ml/min   . Closed right hip fracture ([place]) 12/2018  . Depression   . GERD (gastroesophageal reflux disease)   . GI bleed due to NSAIDs   . Hyperlipidemia   . Hypertension   . Macular degeneration   . Maxillary sinusitis   . MVP (mitral valve prolapse) 03/03/2016   Bileaflet MVP with mild MR by echo 2016  . Old MI (myocardial infarction)    NSTEMI secondary to stress MI/Takotsubo CM, minimal nonobstructive ASCAD at cath  . Osteoporosis   . Persistent atrial fibrillation ([place]) 2006   not on anticoagulation due to GI bleed and increased fall risk  . PMR (polymyalgia rheumatica) (HCC)   . SIADH (syndrome of inappropriate ADH production) ([facility])   . Takotsubo cardiomyopathy    EF normalized to 70%  . Ventricular tachycardia ([facility])    on initial presentation of MI   Past Surgical History:  Procedure Laterality Date  .  CARDIAC CATHETERIZATION     normal coronary arteries  . CHOLECYSTECTOMY  11/10/2011   Procedure: LAPAROSCOPIC CHOLECYSTECTOMY WITH INTRAOPERATIVE CHOLANGIOGRAM;  Surgeon: Pedro Earls, MD;  [facility];  Service: General;  Laterality: N/A;  . EYE SURGERY  06/10/11   membrane peel   . INTRAMEDULLARY (IM) NAIL INTERTROCHANTERIC Right 12/21/2018   Procedure: INTRAMEDULLARY (IM) NAIL INTERTROCHANTRIC;  Surgeon: Nicholes Stairs, MD;  Location: [facility];  Service: Orthopedics;  Laterality: Right;    . NASAL SINUS SURGERY Left   . ROTATOR CUFF REPAIR  1998  . UMBILICAL HERNIA REPAIR  11/10/2011   Procedure: HERNIA REPAIR UMBILICAL ADULT;  Surgeon: Pedro Earls, MD;  [facility];  Service: General;  Laterality: N/A;  . WISDOM TOOTH EXTRACTION      Allergies  Allergen Reactions  . Amlodipine Swelling    To feet and ankles   . Avelox [Moxifloxacin Hcl In Nacl] Other (See Comments)    Pt does not remember   . Biaxin [Clarithromycin]   . Ceftin [Cefuroxime Axetil]   . Duragesic Disc Transdermal System [Fentanyl] Other (See Comments)    Reaction unknown  . Forteo [Parathyroid Hormone (Recomb)] Other (See Comments)    Made skin dry  . Morphine And Related Other (See Comments)    "My Sister and daughter can,t take it. I've had it before without problems."  . Teriparatide   . Lidocaine Rash    Allergies as of 01/16/2020      Reactions   Amlodipine Swelling   To feet and ankles    Avelox [moxifloxacin Hcl In Nacl] Other (See Comments)   Pt does not remember   Biaxin [clarithromycin]    Ceftin [cefuroxime Axetil]    Duragesic Disc Transdermal System [fentanyl] Other (See Comments)   Reaction unknown   Forteo [parathyroid Hormone (recomb)] Other (See Comments)   Made skin dry   Morphine And Related Other (See Comments)   "My Sister and daughter can,t take it. I've had it before without problems."   Teriparatide    Lidocaine Rash      Medication List       Accurate as of January 16, 2020  1:05 PM. If you have any questions, ask your nurse or doctor.        acetaminophen 500 MG tablet Commonly known as: TYLENOL Take 500 mg by mouth every 6 (six) hours as needed. Every 6hrs.   acetaminophen 325 MG tablet Commonly known as: TYLENOL Take 650 mg by mouth 2 (two) times daily.   albuterol (2.5 MG/3ML) 0.083% nebulizer solution Commonly known as: PROVENTIL Take 2.5 mg by nebulization every 6 (six) hours as needed for wheezing or shortness of breath.   amLODipine 5  MG tablet Commonly known as: NORVASC TAKE 1 TABLET BY MOUTH EVERY DAY   azelastine 0.1 % nasal spray Commonly known as: ASTELIN Place 2 sprays into both nostrils 2 (two) times daily as needed for allergies.   budesonide-formoterol 80-4.5 MCG/ACT inhaler Commonly known as: SYMBICORT Inhale 2 puffs into the lungs 2 (two) times daily.   buPROPion 150 MG 12 hr tablet Commonly known as: WELLBUTRIN SR Take 1 tablet (150 mg total) by mouth every morning.   carvedilol 12.5 MG tablet Commonly known as: COREG Take 1 tablet (12.5 mg total) by mouth 2 (two) times daily with a meal.   cetirizine 10 MG tablet Commonly known as: ZYRTEC Take 10 mg by mouth daily.   demeclocycline 150 MG tablet Commonly known as: DECLOMYCIN Take 150  mg by mouth 2 (two) times daily.   diclofenac sodium 1 % Gel Commonly known as: VOLTAREN Apply 1 application topically 4 (four) times daily as needed (pain).   famotidine 20 MG tablet Commonly known as: PEPCID Take 20 mg by mouth daily.   feeding supplement (PRO-STAT SUGAR FREE 64) Liqd Take 30 mLs by mouth daily.   fluticasone 50 MCG/ACT nasal spray Commonly known as: FLONASE Place into both nostrils daily. 2 Sprays in both nostrils.   Iron 325 (65 Fe) MG Tabs Take 1 tablet by mouth daily. Give with meals   lactose free nutrition Liqd Take 237 mLs by mouth 2 (two) times daily. Between Meals 10am-6pm.   Lubricating Eye Drops 0.5-0.9 % ophthalmic solution Generic drug: carboxymethylcellul-glycerin Place 1 drop into both eyes 3 (three) times daily as needed for dry eyes. For allergies   memantine 5 MG tablet Commonly known as: NAMENDA Take 5 mg by mouth 2 (two) times daily.   ondansetron 4 MG tablet Commonly known as: ZOFRAN Take 1 tablet (4 mg total) by mouth every 6 (six) hours as needed for nausea.   oxyCODONE 5 MG immediate release tablet Commonly known as: Oxy IR/ROXICODONE Take 0.5 tablets (2.5 mg total) by mouth 2 (two) times daily as  needed for up to 14 days for severe pain.   pantoprazole 40 MG tablet Commonly known as: PROTONIX Take 40 mg by mouth daily.   polyethylene glycol 17 g packet Commonly known as: MIRALAX / GLYCOLAX Take 17 g by mouth every other day.   pregabalin 50 MG capsule Commonly known as: LYRICA Take 1 capsule (50 mg total) by mouth at bedtime.   pregabalin 25 MG capsule Commonly known as: LYRICA Take one capsule by mouth once daily in the morning.   torsemide 20 MG tablet Commonly known as: DEMADEX Take 20 mg by mouth once.   zinc oxide 20 % ointment Apply 1 application topically as needed for irritation. To buttocks after every incontinent episode and as needed for redness. May keep at bedside       Review of Systems  Psychiatric/Behavioral: Positive for decreased concentration and dysphoric mood. The patient is nervous/anxious.    Constitutional: Negative for activity change, appetite change, chills, diaphoresis, fatigue and fever.  HENT: Negative for mouth sores, postnasal drip, rhinorrhea, sinus pain and sore throat.   Respiratory: Negative for apnea, cough, chest tightness, shortness of breath and wheezing.   Cardiovascular: Negative for chest pain, palpitations and leg swelling.  Gastrointestinal: Negative for abdominal distention, abdominal pain, constipation, diarrhea, nausea and vomiting.  Genitourinary: Negative for dysuria and frequency.  Musculoskeletal: Negative for arthralgias, joint swelling and myalgias.  Skin: Negative for rash.  Neurological: Negative for dizziness, syncope, weakness, light-headedness and numbness.  Psychiatric/Behavioral: Negative for behavioral problems, confusion and sleep disturbance.     Immunization History  Administered Date(s) Administered  . Influenza, High Dose Seasonal PF 07/12/2017, 08/03/2019  . Moderna SARS-COVID-2 Vaccination 10/23/2019, 11/18/2019   Pertinent  Health Maintenance Due  Topic Date Due  . DEXA SCAN  Never done    . PNA vac Low Risk Adult (1 of 2 - PCV13) Never done  . INFLUENZA VACCINE  Completed   No flowsheet data found. Functional Status Survey:    Vitals:   01/16/20 1143  BP: 128/62  Pulse: 70  Resp: 18  Temp: 98 F (36.7 C)  SpO2: 98%  Weight: 128 lb 14.4 oz (58.5 kg)  Height: 4\' 6"  (1.372 m)   Body mass index is  31.08 kg/m. Physical Exam  Constitutional: Oriented to person, place, and time. Well-developed and well-nourished.  HENT:  Head: Normocephalic.  Mouth/Throat: Oropharynx is clear and moist.  Eyes: Pupils are equal, round, and reactive to light.  Neck: Neck supple.  Cardiovascular: Normal rate and normal heart sounds.  No murmur heard. Pulmonary/Chest: Effort normal and breath sounds normal. No respiratory distress. No wheezes. Mild Rales Small Bruise Below Right Breast Abdominal: Soft. Bowel sounds are normal. No distension. There is no tenderness. There is no rebound.  Musculoskeletal: Mild edema Bilateral  Lymphadenopathy: none Neurological: Alert and oriented to person, place, and time.  Skin: Skin is warm and dry.  Psychiatric: Normal mood and affect. Behavior is normal. Thought content normal.    Labs reviewed: Recent Labs    12/07/19 0000 12/21/19 0000 12/26/19 0000  NA 142 142 144  K 4.5 4.3 4.0  CL 103 103 106  CO2 31* 32* 29*  BUN 47* 48* 50*  CREATININE 1.6* 1.3* 1.3*  CALCIUM 9.2 9.5 9.0   Recent Labs    11/14/19 0000 11/23/19 0000 12/26/19 0000  AST 19 15 16   ALT 15 13 15   ALKPHOS 87 82 108  ALBUMIN 3.4* 3.0* 3.0*   Recent Labs    03/30/19 0000 11/14/19 0000 12/26/19 0000  WBC 7.9 6.5 6.8  NEUTROABS  --  3,634 3,903  HGB 9.8* 12.3 11.5*  HCT 30* 38 35*  PLT 532* 266 287   Lab Results  Component Value Date   TSH 2.600 09/19/2018   No results found for: HGBA1C No results found for: CHOL, HDL, LDLCALC, LDLDIRECT, TRIG, CHOLHDL  Significant Diagnostic Results in last 30 days:  No results  found.  Assessment/Plan  Situational depression Patient is on Wellbutrin Discussed about using as needed Ativan.  Patient is worried about the side effects.  She said that she will be okay right now and will let me know if she needs something  Bruise We will just use Vaseline on the bruise and not to use any dressing  Other Issues  Closed fracture of distal end of right ulna, unspecified fracture morphology, sequela Per ortho Splint for 2 weeks  Oxycodone 2.5 mg BID PRN for 2 weeks Morphine allergy very low and she has had Norco before with no issues  Right hip pain Right knee Injected Follow up with Ortho if needed  SIADH (syndrome of inappropriate ADH production) (HCC) Edema stable on demeclocycline Anemia, Hemoglobin is stable on iron  Family/ staff Communication:   Labs/tests ordered:

## 2020-01-17 DIAGNOSIS — R2681 Unsteadiness on feet: Secondary | ICD-10-CM | POA: Diagnosis not present

## 2020-01-17 DIAGNOSIS — R29898 Other symptoms and signs involving the musculoskeletal system: Secondary | ICD-10-CM | POA: Diagnosis not present

## 2020-01-17 DIAGNOSIS — M79604 Pain in right leg: Secondary | ICD-10-CM | POA: Diagnosis not present

## 2020-01-17 DIAGNOSIS — Z9181 History of falling: Secondary | ICD-10-CM | POA: Diagnosis not present

## 2020-01-17 DIAGNOSIS — H3530 Unspecified macular degeneration: Secondary | ICD-10-CM | POA: Diagnosis not present

## 2020-01-17 DIAGNOSIS — R296 Repeated falls: Secondary | ICD-10-CM | POA: Diagnosis not present

## 2020-01-17 DIAGNOSIS — M6281 Muscle weakness (generalized): Secondary | ICD-10-CM | POA: Diagnosis not present

## 2020-01-18 ENCOUNTER — Non-Acute Institutional Stay (SKILLED_NURSING_FACILITY): Payer: Medicare PPO | Admitting: Nurse Practitioner

## 2020-01-18 ENCOUNTER — Encounter: Payer: Self-pay | Admitting: Nurse Practitioner

## 2020-01-18 DIAGNOSIS — K5901 Slow transit constipation: Secondary | ICD-10-CM | POA: Diagnosis not present

## 2020-01-18 DIAGNOSIS — H3530 Unspecified macular degeneration: Secondary | ICD-10-CM | POA: Diagnosis not present

## 2020-01-18 DIAGNOSIS — F329 Major depressive disorder, single episode, unspecified: Secondary | ICD-10-CM

## 2020-01-18 DIAGNOSIS — I4819 Other persistent atrial fibrillation: Secondary | ICD-10-CM | POA: Diagnosis not present

## 2020-01-18 DIAGNOSIS — M545 Low back pain, unspecified: Secondary | ICD-10-CM

## 2020-01-18 DIAGNOSIS — F0393 Unspecified dementia, unspecified severity, with mood disturbance: Secondary | ICD-10-CM

## 2020-01-18 DIAGNOSIS — F0280 Dementia in other diseases classified elsewhere without behavioral disturbance: Secondary | ICD-10-CM

## 2020-01-18 DIAGNOSIS — R296 Repeated falls: Secondary | ICD-10-CM | POA: Diagnosis not present

## 2020-01-18 DIAGNOSIS — R29898 Other symptoms and signs involving the musculoskeletal system: Secondary | ICD-10-CM | POA: Diagnosis not present

## 2020-01-18 DIAGNOSIS — I10 Essential (primary) hypertension: Secondary | ICD-10-CM | POA: Diagnosis not present

## 2020-01-18 DIAGNOSIS — M6281 Muscle weakness (generalized): Secondary | ICD-10-CM | POA: Diagnosis not present

## 2020-01-18 DIAGNOSIS — I5189 Other ill-defined heart diseases: Secondary | ICD-10-CM

## 2020-01-18 DIAGNOSIS — J42 Unspecified chronic bronchitis: Secondary | ICD-10-CM | POA: Diagnosis not present

## 2020-01-18 DIAGNOSIS — E222 Syndrome of inappropriate secretion of antidiuretic hormone: Secondary | ICD-10-CM | POA: Diagnosis not present

## 2020-01-18 DIAGNOSIS — R4189 Other symptoms and signs involving cognitive functions and awareness: Secondary | ICD-10-CM

## 2020-01-18 DIAGNOSIS — G8929 Other chronic pain: Secondary | ICD-10-CM

## 2020-01-18 DIAGNOSIS — K219 Gastro-esophageal reflux disease without esophagitis: Secondary | ICD-10-CM | POA: Diagnosis not present

## 2020-01-18 DIAGNOSIS — M79604 Pain in right leg: Secondary | ICD-10-CM | POA: Diagnosis not present

## 2020-01-18 DIAGNOSIS — R2681 Unsteadiness on feet: Secondary | ICD-10-CM | POA: Diagnosis not present

## 2020-01-18 DIAGNOSIS — D649 Anemia, unspecified: Secondary | ICD-10-CM

## 2020-01-18 NOTE — Progress Notes (Signed)
Location:   [place] Room Number: 6 Place of Service:  SNF (31) Provider:  Jazlynne Milliner, NP   Patient Care Team: [name], MD as PCP - General (Internal Medicine) [name], MD as PCP - Cardiology (Cardiology) [name], MD as Consulting Physician (Internal Medicine) Ngetich, Nelda Bucks, NP as Nurse Practitioner (Family Medicine)  Extended Emergency Contact Information Primary Emergency Contact: Perotti,Bob Address: [address]           [place], [place] Phone: [phone] Relation: Son Secondary Emergency Contact: Philmore [place] Phone: [phone] Mobile Phone: [phone] Relation: Daughter  Code Status:  DNR Goals of care: Advanced Directive information Advanced Directives 01/18/2020  Does Patient Have a Medical Advance Directive? Yes  Type of Paramedic of [place];Out of facility DNR (pink MOST or yellow form);Living will  Does patient want to make changes to medical advance directive? No - Patient declined  Copy of [place] in Chart? Yes - validated most recent copy scanned in chart (See row information)  Would patient like information on creating a medical advance directive? -  Pre-existing out of facility DNR order (yellow form or pink MOST form) -     Chief Complaint  Patient presents with  . Medical Management of Chronic Issues    Routine Visit   . Health Maintenance    Discuss the need for Dexa Scan  . Immunizations    Dicuss the need for Tetanus Vaccine     HPI:  Pt is a 86 y.o. female seen today for medical management of chronic diseases.    The patient resides in SNF [facility] for safety, care assistance, w/c for mobility, on Memantine 5mg  bid, her mood is stable on Wellbutrin 150mg  qd.  Hx of CHF/edema BLE, stable on Torsemide 20mg  qd. Lower back/hip pain, stable, on Lyrical 50mg  qd, 25mg  qd, prn Oxycodone  2.5mg  bid prn, Tylenol 650mg  bid.  Constipation, stable, on MiraLax qod. GERD, controlled on Pantoprazole 40mg  qd, Famotidine 20mg  qd. Hyponatremia, stable, on Demeclocycline 150mg  bid. HTN, blood pressure is controlled on Carvedilol 12.5mg  bid, Amlodipine 5mg  qd. Anemia, stable on Fe. COPD, stable, on Symbicort bid, prn Albuterol Neb.    Past Medical History:  Diagnosis Date  . Amputation of finger of right hand   . Aortic stenosis, mild 03/03/2016   By echo 08/2015  . Arthritis   . Back pain   . Bradycardia 08/28/2015  . CKD (chronic kidney disease) stage 3, GFR 30-59 ml/min   . Closed right hip fracture ([place]) 12/2018  . Depression   . GERD (gastroesophageal reflux disease)   . GI bleed due to NSAIDs   . Hyperlipidemia   . Hypertension   . Macular degeneration   . Maxillary sinusitis   . MVP (mitral valve prolapse) 03/03/2016   Bileaflet MVP with mild MR by echo 2016  . Old MI (myocardial infarction)    NSTEMI secondary to stress MI/Takotsubo CM, minimal nonobstructive ASCAD at cath  . Osteoporosis   . Persistent atrial fibrillation ([place]) 2006   not on anticoagulation due to GI bleed and increased fall risk  . PMR (polymyalgia rheumatica) (HCC)   . SIADH (syndrome of inappropriate ADH production) ([place])   . Takotsubo cardiomyopathy    EF normalized to 70%  . Ventricular tachycardia ([place])    on initial presentation of MI   Past Surgical History:  Procedure Laterality Date  .  CARDIAC CATHETERIZATION     normal coronary arteries  . CHOLECYSTECTOMY  11/10/2011   Procedure: LAPAROSCOPIC CHOLECYSTECTOMY WITH INTRAOPERATIVE CHOLANGIOGRAM;  Surgeon: Pedro Earls, MD;  Location: [facility];  Service: General;  Laterality: N/A;  . EYE SURGERY  06/10/11   membrane peel   . INTRAMEDULLARY (IM) NAIL INTERTROCHANTERIC Right 12/21/2018   Procedure: INTRAMEDULLARY (IM) NAIL INTERTROCHANTRIC;  Surgeon: Nicholes Stairs, MD;  Location: [facility];  Service: Orthopedics;  Laterality: Right;  .  NASAL SINUS SURGERY Left   . ROTATOR CUFF REPAIR  1998  . UMBILICAL HERNIA REPAIR  11/10/2011   Procedure: HERNIA REPAIR UMBILICAL ADULT;  Surgeon: Pedro Earls, MD;  Location: [facility];  Service: General;  Laterality: N/A;  . WISDOM TOOTH EXTRACTION      Allergies  Allergen Reactions  . Amlodipine Swelling    To feet and ankles   . Avelox [Moxifloxacin Hcl In Nacl] Other (See Comments)    Pt does not remember   . Biaxin [Clarithromycin]   . Ceftin [Cefuroxime Axetil]   . Duragesic Disc Transdermal System [Fentanyl] Other (See Comments)    Reaction unknown  . Forteo [Parathyroid Hormone (Recomb)] Other (See Comments)    Made skin dry  . Morphine And Related Other (See Comments)    "My Sister and daughter can,t take it. I've had it before without problems."  . Teriparatide   . Lidocaine Rash    Allergies as of 01/18/2020      Reactions   Amlodipine Swelling   To feet and ankles    Avelox [moxifloxacin Hcl In Nacl] Other (See Comments)   Pt does not remember   Biaxin [clarithromycin]    Ceftin [cefuroxime Axetil]    Duragesic Disc Transdermal System [fentanyl] Other (See Comments)   Reaction unknown   Forteo [parathyroid Hormone (recomb)] Other (See Comments)   Made skin dry   Morphine And Related Other (See Comments)   "My Sister and daughter can,t take it. I've had it before without problems."   Teriparatide    Lidocaine Rash      Medication List       Accurate as of January 18, 2020 11:59 PM. If you have any questions, ask your nurse or doctor.        acetaminophen 500 MG tablet Commonly known as: TYLENOL Take 500 mg by mouth every 6 (six) hours as needed. Every 6hrs.   acetaminophen 325 MG tablet Commonly known as: TYLENOL Take 650 mg by mouth 2 (two) times daily.   albuterol (2.5 MG/3ML) 0.083% nebulizer solution Commonly known as: PROVENTIL Take 2.5 mg by nebulization every 6 (six) hours as needed for wheezing or shortness of breath.   amLODipine 5 MG  tablet Commonly known as: NORVASC TAKE 1 TABLET BY MOUTH EVERY DAY   azelastine 0.1 % nasal spray Commonly known as: ASTELIN Place 2 sprays into both nostrils 2 (two) times daily as needed for allergies.   budesonide-formoterol 80-4.5 MCG/ACT inhaler Commonly known as: SYMBICORT Inhale 2 puffs into the lungs 2 (two) times daily.   buPROPion 150 MG 12 hr tablet Commonly known as: WELLBUTRIN SR Take 1 tablet (150 mg total) by mouth every morning.   carvedilol 12.5 MG tablet Commonly known as: COREG Take 1 tablet (12.5 mg total) by mouth 2 (two) times daily with a meal.   cetirizine 10 MG tablet Commonly known as: ZYRTEC Take 10 mg by mouth daily.   demeclocycline 150 MG tablet Commonly known as: DECLOMYCIN Take 150 mg by  mouth 2 (two) times daily.   diclofenac sodium 1 % Gel Commonly known as: VOLTAREN Apply 1 application topically 4 (four) times daily as needed (pain).   famotidine 20 MG tablet Commonly known as: PEPCID Take 20 mg by mouth daily.   feeding supplement (PRO-STAT SUGAR FREE 64) Liqd Take 30 mLs by mouth daily.   fluticasone 50 MCG/ACT nasal spray Commonly known as: FLONASE Place into both nostrils daily. 2 Sprays in both nostrils.   Iron 325 (65 Fe) MG Tabs Take 1 tablet by mouth daily. Give with meals   lactose free nutrition Liqd Take 237 mLs by mouth 2 (two) times daily. Between Meals 10am-6pm.   Lubricating Eye Drops 0.5-0.9 % ophthalmic solution Generic drug: carboxymethylcellul-glycerin Place 1 drop into both eyes 3 (three) times daily as needed for dry eyes. For allergies   memantine 5 MG tablet Commonly known as: NAMENDA Take 5 mg by mouth 2 (two) times daily.   ondansetron 4 MG tablet Commonly known as: ZOFRAN Take 1 tablet (4 mg total) by mouth every 6 (six) hours as needed for nausea.   oxyCODONE 5 MG immediate release tablet Commonly known as: Oxy IR/ROXICODONE Take 0.5 tablets (2.5 mg total) by mouth 2 (two) times daily as  needed for up to 14 days for severe pain.   pantoprazole 40 MG tablet Commonly known as: PROTONIX Take 40 mg by mouth daily.   polyethylene glycol 17 g packet Commonly known as: MIRALAX / GLYCOLAX Take 17 g by mouth every other day.   pregabalin 50 MG capsule Commonly known as: LYRICA Take 1 capsule (50 mg total) by mouth at bedtime.   pregabalin 25 MG capsule Commonly known as: LYRICA Take one capsule by mouth once daily in the morning.   torsemide 20 MG tablet Commonly known as: DEMADEX Take 20 mg by mouth once.   zinc oxide 20 % ointment Apply 1 application topically as needed for irritation. To buttocks after every incontinent episode and as needed for redness. May keep at bedside       Review of Systems  Constitutional: Negative for activity change, appetite change, fatigue, fever and unexpected weight change.       The patient is more frail, getting weaker gradually.   HENT: Positive for hearing loss. Negative for congestion and voice change.   Eyes: Negative for visual disturbance.  Respiratory: Negative for cough and shortness of breath.   Cardiovascular: Positive for leg swelling. Negative for chest pain and palpitations.  Gastrointestinal: Negative for abdominal distention, abdominal pain and constipation.       C/o nauseated this morning, but better now, did not vomit, c/o loose stools.   Genitourinary: Negative for difficulty urinating, dysuria and urgency.  Musculoskeletal: Positive for arthralgias, back pain, gait problem and myalgias.       Right hip, lower back. Right wrist fx/in splint.   Skin: Negative for color change and pallor.  Neurological: Negative for dizziness, speech difficulty, weakness and headaches.       Memory lapses.   Psychiatric/Behavioral: Negative for agitation, behavioral problems and sleep disturbance. The patient is not nervous/anxious.     Immunization History  Administered Date(s) Administered  . DTaP 12/14/2011  . Influenza,  High Dose Seasonal PF 07/12/2017, 08/03/2019  . Moderna SARS-COVID-2 Vaccination 10/23/2019, 11/18/2019  . Pneumococcal Conjugate-13 06/01/2014  . Pneumococcal Polysaccharide-23 06/11/1999   Pertinent  Health Maintenance Due  Topic Date Due  . DEXA SCAN  Never done  . INFLUENZA VACCINE  05/19/2020  .  PNA vac Low Risk Adult  Completed   No flowsheet data found. Functional Status Survey:    Vitals:   01/18/20 1050  BP: 128/62  Pulse: 60  Resp: 18  Temp: (!) 96.8 F (36 C)  SpO2: 96%  Weight: 125 lb 4.8 oz (56.8 kg)  Height: 4\' 6"  (1.372 m)   Body mass index is 30.21 kg/m. Physical Exam Vitals and nursing note reviewed.  Constitutional:      General: She is not in acute distress.    Appearance: Normal appearance. She is not ill-appearing.  HENT:     Head: Normocephalic and atraumatic.     Nose: Nose normal.     Mouth/Throat:     Mouth: Mucous membranes are moist.  Eyes:     Extraocular Movements: Extraocular movements intact.     Conjunctiva/sclera: Conjunctivae normal.     Pupils: Pupils are equal, round, and reactive to light.  Cardiovascular:     Rate and Rhythm: Normal rate and regular rhythm.     Heart sounds: Murmur present.     Comments: HR in 60 upon my examination Pulmonary:     Breath sounds: Rales present. No wheezing or rhonchi.     Comments: Bibasilar rales.  Abdominal:     General: Bowel sounds are normal. There is no distension.     Tenderness: There is no abdominal tenderness. There is no guarding or rebound.  Musculoskeletal:     Cervical back: Normal range of motion and neck supple.     Right lower leg: Edema present.     Left lower leg: Edema present.     Comments: 1+ edema BLE, mostly in feet. Chronic pain in the lower back, right hip. Right wrist fx/in splint.   Skin:    General: Skin is warm and dry.  Neurological:     General: No focal deficit present.     Mental Status: She is alert. Mental status is at baseline.     Motor: No  weakness.     Gait: Gait abnormal.     Comments: Oriented to person, place.   Psychiatric:        Mood and Affect: Mood normal.        Behavior: Behavior normal.     Labs reviewed: Recent Labs    12/07/19 0000 12/21/19 0000 12/26/19 0000  NA 142 142 144  K 4.5 4.3 4.0  CL 103 103 106  CO2 31* 32* 29*  BUN 47* 48* 50*  CREATININE 1.6* 1.3* 1.3*  CALCIUM 9.2 9.5 9.0   Recent Labs    11/14/19 0000 11/23/19 0000 12/26/19 0000  AST 19 15 16   ALT 15 13 15   ALKPHOS 87 82 108  ALBUMIN 3.4* 3.0* 3.0*   Recent Labs    03/30/19 0000 11/14/19 0000 12/26/19 0000  WBC 7.9 6.5 6.8  NEUTROABS  --  3,634 3,903  HGB 9.8* 12.3 11.5*  HCT 30* 38 35*  PLT 532* 266 287   Lab Results  Component Value Date   TSH 2.600 09/19/2018   No results found for: HGBA1C No results found for: CHOL, HDL, LDLCALC, LDLDIRECT, TRIG, CHOLHDL  Significant Diagnostic Results in last 30 days:  No results found.  Assessment/Plan Essential hypertension, benign blood pressure is controlled, continue Carvedilol 12.5mg  bid, Amlodipine 5mg  qd.  Persistent atrial fibrillation (HCC) Heart rate is in control, continue Carvedilol, off anticoagulation due to GI bleed.   Chronic bronchitis (HCC) stable, continue Symbicort bid, prn Albuterol Neb.  Esophageal reflux Controlled, continue Pantoprazole 40mg  qd, Famotidine 20mg  qd.  Slow transit constipation Stable, continue  MiraLax qod.  SIADH (syndrome of inappropriate ADH production) (HCC) Stable, continue Demeclocycline   Depression due to dementia [facility]) Her mood is stable, continue Wellbutrin  Back pain Lower back/hip pain, stable, continue Lyrical 50mg  qd, 25mg  qd, prn Oxycodone 2.5mg  bid prn, Tylenol 650mg  bid.  Cognitive impairment Continue SNF [facility] for safety, care assistance, continue Memantine.   Diastolic dysfunction Chronic edema BLE, continue Furosemide  Anemia Stable, Hgb 11.5 12/26/19, continue Fe     Family/ staff  Communication: plan of care reviewed with the patient and charge nurse.   Labs/tests ordered:  None    Time spend  25 minutes.

## 2020-01-18 NOTE — Progress Notes (Signed)
Error

## 2020-01-19 DIAGNOSIS — M79604 Pain in right leg: Secondary | ICD-10-CM | POA: Diagnosis not present

## 2020-01-19 DIAGNOSIS — K219 Gastro-esophageal reflux disease without esophagitis: Secondary | ICD-10-CM | POA: Diagnosis not present

## 2020-01-19 DIAGNOSIS — R2681 Unsteadiness on feet: Secondary | ICD-10-CM | POA: Diagnosis not present

## 2020-01-19 DIAGNOSIS — H3530 Unspecified macular degeneration: Secondary | ICD-10-CM | POA: Diagnosis not present

## 2020-01-19 DIAGNOSIS — R296 Repeated falls: Secondary | ICD-10-CM | POA: Diagnosis not present

## 2020-01-19 DIAGNOSIS — R4189 Other symptoms and signs involving cognitive functions and awareness: Secondary | ICD-10-CM | POA: Diagnosis not present

## 2020-01-19 DIAGNOSIS — M6281 Muscle weakness (generalized): Secondary | ICD-10-CM | POA: Diagnosis not present

## 2020-01-19 DIAGNOSIS — R29898 Other symptoms and signs involving the musculoskeletal system: Secondary | ICD-10-CM | POA: Diagnosis not present

## 2020-01-19 DIAGNOSIS — J42 Unspecified chronic bronchitis: Secondary | ICD-10-CM | POA: Diagnosis not present

## 2020-01-22 ENCOUNTER — Other Ambulatory Visit: Payer: Self-pay

## 2020-01-22 DIAGNOSIS — M79604 Pain in right leg: Secondary | ICD-10-CM | POA: Diagnosis not present

## 2020-01-22 DIAGNOSIS — R4189 Other symptoms and signs involving cognitive functions and awareness: Secondary | ICD-10-CM | POA: Diagnosis not present

## 2020-01-22 DIAGNOSIS — R296 Repeated falls: Secondary | ICD-10-CM | POA: Diagnosis not present

## 2020-01-22 DIAGNOSIS — R29898 Other symptoms and signs involving the musculoskeletal system: Secondary | ICD-10-CM | POA: Diagnosis not present

## 2020-01-22 DIAGNOSIS — H3530 Unspecified macular degeneration: Secondary | ICD-10-CM | POA: Diagnosis not present

## 2020-01-22 DIAGNOSIS — R2681 Unsteadiness on feet: Secondary | ICD-10-CM | POA: Diagnosis not present

## 2020-01-22 DIAGNOSIS — M6281 Muscle weakness (generalized): Secondary | ICD-10-CM | POA: Diagnosis not present

## 2020-01-22 DIAGNOSIS — K219 Gastro-esophageal reflux disease without esophagitis: Secondary | ICD-10-CM | POA: Diagnosis not present

## 2020-01-22 DIAGNOSIS — J42 Unspecified chronic bronchitis: Secondary | ICD-10-CM | POA: Diagnosis not present

## 2020-01-22 MED ORDER — OXYCODONE HCL 5 MG PO TABS: 2.5000 mg | ORAL_TABLET | Freq: Two times a day (BID) | ORAL | 0 refills | Status: AC | PRN

## 2020-01-22 MED ORDER — PREGABALIN 50 MG PO CAPS: 50.0000 mg | ORAL_CAPSULE | Freq: Every day | ORAL | 0 refills | Status: DC

## 2020-01-23 ENCOUNTER — Encounter: Payer: Self-pay | Admitting: Nurse Practitioner

## 2020-01-23 DIAGNOSIS — J42 Unspecified chronic bronchitis: Secondary | ICD-10-CM | POA: Diagnosis not present

## 2020-01-23 DIAGNOSIS — R29898 Other symptoms and signs involving the musculoskeletal system: Secondary | ICD-10-CM | POA: Diagnosis not present

## 2020-01-23 DIAGNOSIS — M6281 Muscle weakness (generalized): Secondary | ICD-10-CM | POA: Diagnosis not present

## 2020-01-23 DIAGNOSIS — H3530 Unspecified macular degeneration: Secondary | ICD-10-CM | POA: Diagnosis not present

## 2020-01-23 DIAGNOSIS — R4189 Other symptoms and signs involving cognitive functions and awareness: Secondary | ICD-10-CM | POA: Diagnosis not present

## 2020-01-23 DIAGNOSIS — K219 Gastro-esophageal reflux disease without esophagitis: Secondary | ICD-10-CM | POA: Diagnosis not present

## 2020-01-23 DIAGNOSIS — R2681 Unsteadiness on feet: Secondary | ICD-10-CM | POA: Diagnosis not present

## 2020-01-23 DIAGNOSIS — R296 Repeated falls: Secondary | ICD-10-CM | POA: Diagnosis not present

## 2020-01-23 DIAGNOSIS — M79604 Pain in right leg: Secondary | ICD-10-CM | POA: Diagnosis not present

## 2020-01-24 DIAGNOSIS — R29898 Other symptoms and signs involving the musculoskeletal system: Secondary | ICD-10-CM | POA: Diagnosis not present

## 2020-01-24 DIAGNOSIS — R296 Repeated falls: Secondary | ICD-10-CM | POA: Diagnosis not present

## 2020-01-24 DIAGNOSIS — R2681 Unsteadiness on feet: Secondary | ICD-10-CM | POA: Diagnosis not present

## 2020-01-24 DIAGNOSIS — K219 Gastro-esophageal reflux disease without esophagitis: Secondary | ICD-10-CM | POA: Diagnosis not present

## 2020-01-24 DIAGNOSIS — J42 Unspecified chronic bronchitis: Secondary | ICD-10-CM | POA: Diagnosis not present

## 2020-01-24 DIAGNOSIS — M6281 Muscle weakness (generalized): Secondary | ICD-10-CM | POA: Diagnosis not present

## 2020-01-24 DIAGNOSIS — M79604 Pain in right leg: Secondary | ICD-10-CM | POA: Diagnosis not present

## 2020-01-24 DIAGNOSIS — H3530 Unspecified macular degeneration: Secondary | ICD-10-CM | POA: Diagnosis not present

## 2020-01-24 DIAGNOSIS — R4189 Other symptoms and signs involving cognitive functions and awareness: Secondary | ICD-10-CM | POA: Diagnosis not present

## 2020-01-24 NOTE — Assessment & Plan Note (Signed)
Controlled, continue Pantoprazole 40mg  qd, Famotidine 20mg  qd.

## 2020-01-24 NOTE — Assessment & Plan Note (Signed)
Stable, Hgb 11.5 12/26/19, continue Fe

## 2020-01-24 NOTE — Assessment & Plan Note (Signed)
Stable, continue Demeclocycline

## 2020-01-24 NOTE — Assessment & Plan Note (Signed)
Chronic edema BLE, continue Furosemide

## 2020-01-24 NOTE — Assessment & Plan Note (Signed)
blood pressure is controlled, continue Carvedilol 12.5mg  bid, Amlodipine 5mg  qd.

## 2020-01-24 NOTE — Assessment & Plan Note (Signed)
Lower back/hip pain, stable, continue Lyrical 50mg  qd, 25mg  qd, prn Oxycodone 2.5mg  bid prn, Tylenol 650mg  bid.

## 2020-01-24 NOTE — Assessment & Plan Note (Signed)
Stable, continue  MiraLax qod.

## 2020-01-24 NOTE — Assessment & Plan Note (Signed)
stable, continue Symbicort bid, prn Albuterol Neb.

## 2020-01-24 NOTE — Assessment & Plan Note (Signed)
Her mood is stable, continue Wellbutrin

## 2020-01-24 NOTE — Assessment & Plan Note (Signed)
Heart rate is in control, continue Carvedilol, off anticoagulation due to GI bleed.

## 2020-01-24 NOTE — Assessment & Plan Note (Signed)
Continue SN[facility] for safety, care assistance, continue Memantine.

## 2020-01-25 DIAGNOSIS — J42 Unspecified chronic bronchitis: Secondary | ICD-10-CM | POA: Diagnosis not present

## 2020-01-25 DIAGNOSIS — K219 Gastro-esophageal reflux disease without esophagitis: Secondary | ICD-10-CM | POA: Diagnosis not present

## 2020-01-25 DIAGNOSIS — H3530 Unspecified macular degeneration: Secondary | ICD-10-CM | POA: Diagnosis not present

## 2020-01-25 DIAGNOSIS — M6281 Muscle weakness (generalized): Secondary | ICD-10-CM | POA: Diagnosis not present

## 2020-01-25 DIAGNOSIS — R4189 Other symptoms and signs involving cognitive functions and awareness: Secondary | ICD-10-CM | POA: Diagnosis not present

## 2020-01-25 DIAGNOSIS — M79604 Pain in right leg: Secondary | ICD-10-CM | POA: Diagnosis not present

## 2020-01-25 DIAGNOSIS — R296 Repeated falls: Secondary | ICD-10-CM | POA: Diagnosis not present

## 2020-01-25 DIAGNOSIS — R2681 Unsteadiness on feet: Secondary | ICD-10-CM | POA: Diagnosis not present

## 2020-01-25 DIAGNOSIS — R29898 Other symptoms and signs involving the musculoskeletal system: Secondary | ICD-10-CM | POA: Diagnosis not present

## 2020-01-26 DIAGNOSIS — R2681 Unsteadiness on feet: Secondary | ICD-10-CM | POA: Diagnosis not present

## 2020-01-26 DIAGNOSIS — R296 Repeated falls: Secondary | ICD-10-CM | POA: Diagnosis not present

## 2020-01-26 DIAGNOSIS — R29898 Other symptoms and signs involving the musculoskeletal system: Secondary | ICD-10-CM | POA: Diagnosis not present

## 2020-01-26 DIAGNOSIS — H3530 Unspecified macular degeneration: Secondary | ICD-10-CM | POA: Diagnosis not present

## 2020-01-26 DIAGNOSIS — M79604 Pain in right leg: Secondary | ICD-10-CM | POA: Diagnosis not present

## 2020-01-26 DIAGNOSIS — J42 Unspecified chronic bronchitis: Secondary | ICD-10-CM | POA: Diagnosis not present

## 2020-01-26 DIAGNOSIS — R4189 Other symptoms and signs involving cognitive functions and awareness: Secondary | ICD-10-CM | POA: Diagnosis not present

## 2020-01-26 DIAGNOSIS — M6281 Muscle weakness (generalized): Secondary | ICD-10-CM | POA: Diagnosis not present

## 2020-01-26 DIAGNOSIS — K219 Gastro-esophageal reflux disease without esophagitis: Secondary | ICD-10-CM | POA: Diagnosis not present

## 2020-01-30 DIAGNOSIS — R2681 Unsteadiness on feet: Secondary | ICD-10-CM | POA: Diagnosis not present

## 2020-01-30 DIAGNOSIS — K219 Gastro-esophageal reflux disease without esophagitis: Secondary | ICD-10-CM | POA: Diagnosis not present

## 2020-01-30 DIAGNOSIS — M6281 Muscle weakness (generalized): Secondary | ICD-10-CM | POA: Diagnosis not present

## 2020-01-30 DIAGNOSIS — J42 Unspecified chronic bronchitis: Secondary | ICD-10-CM | POA: Diagnosis not present

## 2020-01-30 DIAGNOSIS — R29898 Other symptoms and signs involving the musculoskeletal system: Secondary | ICD-10-CM | POA: Diagnosis not present

## 2020-01-30 DIAGNOSIS — M79604 Pain in right leg: Secondary | ICD-10-CM | POA: Diagnosis not present

## 2020-01-30 DIAGNOSIS — R296 Repeated falls: Secondary | ICD-10-CM | POA: Diagnosis not present

## 2020-01-30 DIAGNOSIS — H3530 Unspecified macular degeneration: Secondary | ICD-10-CM | POA: Diagnosis not present

## 2020-01-30 DIAGNOSIS — R4189 Other symptoms and signs involving cognitive functions and awareness: Secondary | ICD-10-CM | POA: Diagnosis not present

## 2020-02-01 DIAGNOSIS — R4189 Other symptoms and signs involving cognitive functions and awareness: Secondary | ICD-10-CM | POA: Diagnosis not present

## 2020-02-01 DIAGNOSIS — R29898 Other symptoms and signs involving the musculoskeletal system: Secondary | ICD-10-CM | POA: Diagnosis not present

## 2020-02-01 DIAGNOSIS — J42 Unspecified chronic bronchitis: Secondary | ICD-10-CM | POA: Diagnosis not present

## 2020-02-01 DIAGNOSIS — K219 Gastro-esophageal reflux disease without esophagitis: Secondary | ICD-10-CM | POA: Diagnosis not present

## 2020-02-01 DIAGNOSIS — H3530 Unspecified macular degeneration: Secondary | ICD-10-CM | POA: Diagnosis not present

## 2020-02-01 DIAGNOSIS — M6281 Muscle weakness (generalized): Secondary | ICD-10-CM | POA: Diagnosis not present

## 2020-02-01 DIAGNOSIS — R2681 Unsteadiness on feet: Secondary | ICD-10-CM | POA: Diagnosis not present

## 2020-02-01 DIAGNOSIS — R296 Repeated falls: Secondary | ICD-10-CM | POA: Diagnosis not present

## 2020-02-01 DIAGNOSIS — M79604 Pain in right leg: Secondary | ICD-10-CM | POA: Diagnosis not present

## 2020-02-02 DIAGNOSIS — H3530 Unspecified macular degeneration: Secondary | ICD-10-CM | POA: Diagnosis not present

## 2020-02-02 DIAGNOSIS — R29898 Other symptoms and signs involving the musculoskeletal system: Secondary | ICD-10-CM | POA: Diagnosis not present

## 2020-02-02 DIAGNOSIS — K219 Gastro-esophageal reflux disease without esophagitis: Secondary | ICD-10-CM | POA: Diagnosis not present

## 2020-02-02 DIAGNOSIS — M79604 Pain in right leg: Secondary | ICD-10-CM | POA: Diagnosis not present

## 2020-02-02 DIAGNOSIS — R4189 Other symptoms and signs involving cognitive functions and awareness: Secondary | ICD-10-CM | POA: Diagnosis not present

## 2020-02-02 DIAGNOSIS — J42 Unspecified chronic bronchitis: Secondary | ICD-10-CM | POA: Diagnosis not present

## 2020-02-02 DIAGNOSIS — R2681 Unsteadiness on feet: Secondary | ICD-10-CM | POA: Diagnosis not present

## 2020-02-02 DIAGNOSIS — R296 Repeated falls: Secondary | ICD-10-CM | POA: Diagnosis not present

## 2020-02-02 DIAGNOSIS — M6281 Muscle weakness (generalized): Secondary | ICD-10-CM | POA: Diagnosis not present

## 2020-02-03 DIAGNOSIS — R29898 Other symptoms and signs involving the musculoskeletal system: Secondary | ICD-10-CM | POA: Diagnosis not present

## 2020-02-03 DIAGNOSIS — J42 Unspecified chronic bronchitis: Secondary | ICD-10-CM | POA: Diagnosis not present

## 2020-02-03 DIAGNOSIS — M79604 Pain in right leg: Secondary | ICD-10-CM | POA: Diagnosis not present

## 2020-02-03 DIAGNOSIS — K219 Gastro-esophageal reflux disease without esophagitis: Secondary | ICD-10-CM | POA: Diagnosis not present

## 2020-02-03 DIAGNOSIS — R296 Repeated falls: Secondary | ICD-10-CM | POA: Diagnosis not present

## 2020-02-03 DIAGNOSIS — R2681 Unsteadiness on feet: Secondary | ICD-10-CM | POA: Diagnosis not present

## 2020-02-03 DIAGNOSIS — H3530 Unspecified macular degeneration: Secondary | ICD-10-CM | POA: Diagnosis not present

## 2020-02-03 DIAGNOSIS — M6281 Muscle weakness (generalized): Secondary | ICD-10-CM | POA: Diagnosis not present

## 2020-02-03 DIAGNOSIS — R4189 Other symptoms and signs involving cognitive functions and awareness: Secondary | ICD-10-CM | POA: Diagnosis not present

## 2020-02-05 DIAGNOSIS — J42 Unspecified chronic bronchitis: Secondary | ICD-10-CM | POA: Diagnosis not present

## 2020-02-05 DIAGNOSIS — R296 Repeated falls: Secondary | ICD-10-CM | POA: Diagnosis not present

## 2020-02-05 DIAGNOSIS — M6281 Muscle weakness (generalized): Secondary | ICD-10-CM | POA: Diagnosis not present

## 2020-02-05 DIAGNOSIS — M79604 Pain in right leg: Secondary | ICD-10-CM | POA: Diagnosis not present

## 2020-02-05 DIAGNOSIS — K219 Gastro-esophageal reflux disease without esophagitis: Secondary | ICD-10-CM | POA: Diagnosis not present

## 2020-02-05 DIAGNOSIS — R29898 Other symptoms and signs involving the musculoskeletal system: Secondary | ICD-10-CM | POA: Diagnosis not present

## 2020-02-05 DIAGNOSIS — R2681 Unsteadiness on feet: Secondary | ICD-10-CM | POA: Diagnosis not present

## 2020-02-05 DIAGNOSIS — H3530 Unspecified macular degeneration: Secondary | ICD-10-CM | POA: Diagnosis not present

## 2020-02-05 DIAGNOSIS — R4189 Other symptoms and signs involving cognitive functions and awareness: Secondary | ICD-10-CM | POA: Diagnosis not present

## 2020-02-06 DIAGNOSIS — R296 Repeated falls: Secondary | ICD-10-CM | POA: Diagnosis not present

## 2020-02-06 DIAGNOSIS — R29898 Other symptoms and signs involving the musculoskeletal system: Secondary | ICD-10-CM | POA: Diagnosis not present

## 2020-02-06 DIAGNOSIS — H3530 Unspecified macular degeneration: Secondary | ICD-10-CM | POA: Diagnosis not present

## 2020-02-06 DIAGNOSIS — K219 Gastro-esophageal reflux disease without esophagitis: Secondary | ICD-10-CM | POA: Diagnosis not present

## 2020-02-06 DIAGNOSIS — M79604 Pain in right leg: Secondary | ICD-10-CM | POA: Diagnosis not present

## 2020-02-06 DIAGNOSIS — R2681 Unsteadiness on feet: Secondary | ICD-10-CM | POA: Diagnosis not present

## 2020-02-06 DIAGNOSIS — R4189 Other symptoms and signs involving cognitive functions and awareness: Secondary | ICD-10-CM | POA: Diagnosis not present

## 2020-02-06 DIAGNOSIS — J42 Unspecified chronic bronchitis: Secondary | ICD-10-CM | POA: Diagnosis not present

## 2020-02-06 DIAGNOSIS — M6281 Muscle weakness (generalized): Secondary | ICD-10-CM | POA: Diagnosis not present

## 2020-02-08 DIAGNOSIS — K219 Gastro-esophageal reflux disease without esophagitis: Secondary | ICD-10-CM | POA: Diagnosis not present

## 2020-02-08 DIAGNOSIS — R29898 Other symptoms and signs involving the musculoskeletal system: Secondary | ICD-10-CM | POA: Diagnosis not present

## 2020-02-08 DIAGNOSIS — H3530 Unspecified macular degeneration: Secondary | ICD-10-CM | POA: Diagnosis not present

## 2020-02-08 DIAGNOSIS — J42 Unspecified chronic bronchitis: Secondary | ICD-10-CM | POA: Diagnosis not present

## 2020-02-08 DIAGNOSIS — M79604 Pain in right leg: Secondary | ICD-10-CM | POA: Diagnosis not present

## 2020-02-08 DIAGNOSIS — M6281 Muscle weakness (generalized): Secondary | ICD-10-CM | POA: Diagnosis not present

## 2020-02-08 DIAGNOSIS — R2681 Unsteadiness on feet: Secondary | ICD-10-CM | POA: Diagnosis not present

## 2020-02-08 DIAGNOSIS — R4189 Other symptoms and signs involving cognitive functions and awareness: Secondary | ICD-10-CM | POA: Diagnosis not present

## 2020-02-08 DIAGNOSIS — R296 Repeated falls: Secondary | ICD-10-CM | POA: Diagnosis not present

## 2020-02-09 ENCOUNTER — Other Ambulatory Visit: Payer: Self-pay | Admitting: Nurse Practitioner

## 2020-02-09 DIAGNOSIS — J42 Unspecified chronic bronchitis: Secondary | ICD-10-CM | POA: Diagnosis not present

## 2020-02-09 DIAGNOSIS — R4189 Other symptoms and signs involving cognitive functions and awareness: Secondary | ICD-10-CM | POA: Diagnosis not present

## 2020-02-09 DIAGNOSIS — M6281 Muscle weakness (generalized): Secondary | ICD-10-CM | POA: Diagnosis not present

## 2020-02-09 DIAGNOSIS — R2681 Unsteadiness on feet: Secondary | ICD-10-CM | POA: Diagnosis not present

## 2020-02-09 DIAGNOSIS — K219 Gastro-esophageal reflux disease without esophagitis: Secondary | ICD-10-CM | POA: Diagnosis not present

## 2020-02-09 DIAGNOSIS — R29898 Other symptoms and signs involving the musculoskeletal system: Secondary | ICD-10-CM | POA: Diagnosis not present

## 2020-02-09 DIAGNOSIS — R296 Repeated falls: Secondary | ICD-10-CM | POA: Diagnosis not present

## 2020-02-09 DIAGNOSIS — M79604 Pain in right leg: Secondary | ICD-10-CM | POA: Diagnosis not present

## 2020-02-09 DIAGNOSIS — H3530 Unspecified macular degeneration: Secondary | ICD-10-CM | POA: Diagnosis not present

## 2020-02-09 MED ORDER — PREGABALIN 25 MG PO CAPS: 25.0000 mg | ORAL_CAPSULE | Freq: Every day | ORAL | 2 refills | Status: DC

## 2020-02-12 DIAGNOSIS — R296 Repeated falls: Secondary | ICD-10-CM | POA: Diagnosis not present

## 2020-02-12 DIAGNOSIS — M79604 Pain in right leg: Secondary | ICD-10-CM | POA: Diagnosis not present

## 2020-02-12 DIAGNOSIS — R2681 Unsteadiness on feet: Secondary | ICD-10-CM | POA: Diagnosis not present

## 2020-02-12 DIAGNOSIS — H3530 Unspecified macular degeneration: Secondary | ICD-10-CM | POA: Diagnosis not present

## 2020-02-12 DIAGNOSIS — M6281 Muscle weakness (generalized): Secondary | ICD-10-CM | POA: Diagnosis not present

## 2020-02-12 DIAGNOSIS — K219 Gastro-esophageal reflux disease without esophagitis: Secondary | ICD-10-CM | POA: Diagnosis not present

## 2020-02-12 DIAGNOSIS — J42 Unspecified chronic bronchitis: Secondary | ICD-10-CM | POA: Diagnosis not present

## 2020-02-12 DIAGNOSIS — R29898 Other symptoms and signs involving the musculoskeletal system: Secondary | ICD-10-CM | POA: Diagnosis not present

## 2020-02-12 DIAGNOSIS — R4189 Other symptoms and signs involving cognitive functions and awareness: Secondary | ICD-10-CM | POA: Diagnosis not present

## 2020-02-13 DIAGNOSIS — R4189 Other symptoms and signs involving cognitive functions and awareness: Secondary | ICD-10-CM | POA: Diagnosis not present

## 2020-02-13 DIAGNOSIS — R296 Repeated falls: Secondary | ICD-10-CM | POA: Diagnosis not present

## 2020-02-13 DIAGNOSIS — R29898 Other symptoms and signs involving the musculoskeletal system: Secondary | ICD-10-CM | POA: Diagnosis not present

## 2020-02-13 DIAGNOSIS — M6281 Muscle weakness (generalized): Secondary | ICD-10-CM | POA: Diagnosis not present

## 2020-02-13 DIAGNOSIS — H3530 Unspecified macular degeneration: Secondary | ICD-10-CM | POA: Diagnosis not present

## 2020-02-13 DIAGNOSIS — R2681 Unsteadiness on feet: Secondary | ICD-10-CM | POA: Diagnosis not present

## 2020-02-13 DIAGNOSIS — K219 Gastro-esophageal reflux disease without esophagitis: Secondary | ICD-10-CM | POA: Diagnosis not present

## 2020-02-13 DIAGNOSIS — J42 Unspecified chronic bronchitis: Secondary | ICD-10-CM | POA: Diagnosis not present

## 2020-02-13 DIAGNOSIS — M79604 Pain in right leg: Secondary | ICD-10-CM | POA: Diagnosis not present

## 2020-02-14 DIAGNOSIS — R296 Repeated falls: Secondary | ICD-10-CM | POA: Diagnosis not present

## 2020-02-14 DIAGNOSIS — H3530 Unspecified macular degeneration: Secondary | ICD-10-CM | POA: Diagnosis not present

## 2020-02-14 DIAGNOSIS — K219 Gastro-esophageal reflux disease without esophagitis: Secondary | ICD-10-CM | POA: Diagnosis not present

## 2020-02-14 DIAGNOSIS — R2681 Unsteadiness on feet: Secondary | ICD-10-CM | POA: Diagnosis not present

## 2020-02-14 DIAGNOSIS — M79604 Pain in right leg: Secondary | ICD-10-CM | POA: Diagnosis not present

## 2020-02-14 DIAGNOSIS — R29898 Other symptoms and signs involving the musculoskeletal system: Secondary | ICD-10-CM | POA: Diagnosis not present

## 2020-02-14 DIAGNOSIS — R4189 Other symptoms and signs involving cognitive functions and awareness: Secondary | ICD-10-CM | POA: Diagnosis not present

## 2020-02-14 DIAGNOSIS — J42 Unspecified chronic bronchitis: Secondary | ICD-10-CM | POA: Diagnosis not present

## 2020-02-14 DIAGNOSIS — M6281 Muscle weakness (generalized): Secondary | ICD-10-CM | POA: Diagnosis not present

## 2020-02-20 DIAGNOSIS — M6281 Muscle weakness (generalized): Secondary | ICD-10-CM | POA: Diagnosis not present

## 2020-02-20 DIAGNOSIS — G8929 Other chronic pain: Secondary | ICD-10-CM | POA: Diagnosis not present

## 2020-02-20 DIAGNOSIS — R4189 Other symptoms and signs involving cognitive functions and awareness: Secondary | ICD-10-CM | POA: Diagnosis not present

## 2020-02-20 DIAGNOSIS — R29898 Other symptoms and signs involving the musculoskeletal system: Secondary | ICD-10-CM | POA: Diagnosis not present

## 2020-02-20 DIAGNOSIS — I4819 Other persistent atrial fibrillation: Secondary | ICD-10-CM | POA: Diagnosis not present

## 2020-02-20 DIAGNOSIS — H3530 Unspecified macular degeneration: Secondary | ICD-10-CM | POA: Diagnosis not present

## 2020-02-20 DIAGNOSIS — R296 Repeated falls: Secondary | ICD-10-CM | POA: Diagnosis not present

## 2020-02-20 DIAGNOSIS — F0280 Dementia in other diseases classified elsewhere without behavioral disturbance: Secondary | ICD-10-CM | POA: Diagnosis not present

## 2020-02-20 DIAGNOSIS — K219 Gastro-esophageal reflux disease without esophagitis: Secondary | ICD-10-CM | POA: Diagnosis not present

## 2020-02-22 DIAGNOSIS — G8929 Other chronic pain: Secondary | ICD-10-CM | POA: Diagnosis not present

## 2020-02-22 DIAGNOSIS — R29898 Other symptoms and signs involving the musculoskeletal system: Secondary | ICD-10-CM | POA: Diagnosis not present

## 2020-02-22 DIAGNOSIS — R296 Repeated falls: Secondary | ICD-10-CM | POA: Diagnosis not present

## 2020-02-22 DIAGNOSIS — H3530 Unspecified macular degeneration: Secondary | ICD-10-CM | POA: Diagnosis not present

## 2020-02-22 DIAGNOSIS — K219 Gastro-esophageal reflux disease without esophagitis: Secondary | ICD-10-CM | POA: Diagnosis not present

## 2020-02-22 DIAGNOSIS — M6281 Muscle weakness (generalized): Secondary | ICD-10-CM | POA: Diagnosis not present

## 2020-02-22 DIAGNOSIS — F0280 Dementia in other diseases classified elsewhere without behavioral disturbance: Secondary | ICD-10-CM | POA: Diagnosis not present

## 2020-02-22 DIAGNOSIS — I4819 Other persistent atrial fibrillation: Secondary | ICD-10-CM | POA: Diagnosis not present

## 2020-02-22 DIAGNOSIS — R4189 Other symptoms and signs involving cognitive functions and awareness: Secondary | ICD-10-CM | POA: Diagnosis not present

## 2020-02-23 DIAGNOSIS — H3530 Unspecified macular degeneration: Secondary | ICD-10-CM | POA: Diagnosis not present

## 2020-02-23 DIAGNOSIS — K219 Gastro-esophageal reflux disease without esophagitis: Secondary | ICD-10-CM | POA: Diagnosis not present

## 2020-02-23 DIAGNOSIS — F0280 Dementia in other diseases classified elsewhere without behavioral disturbance: Secondary | ICD-10-CM | POA: Diagnosis not present

## 2020-02-23 DIAGNOSIS — I4819 Other persistent atrial fibrillation: Secondary | ICD-10-CM | POA: Diagnosis not present

## 2020-02-23 DIAGNOSIS — R296 Repeated falls: Secondary | ICD-10-CM | POA: Diagnosis not present

## 2020-02-23 DIAGNOSIS — G8929 Other chronic pain: Secondary | ICD-10-CM | POA: Diagnosis not present

## 2020-02-23 DIAGNOSIS — R4189 Other symptoms and signs involving cognitive functions and awareness: Secondary | ICD-10-CM | POA: Diagnosis not present

## 2020-02-23 DIAGNOSIS — M6281 Muscle weakness (generalized): Secondary | ICD-10-CM | POA: Diagnosis not present

## 2020-02-23 DIAGNOSIS — R29898 Other symptoms and signs involving the musculoskeletal system: Secondary | ICD-10-CM | POA: Diagnosis not present

## 2020-02-26 DIAGNOSIS — G8929 Other chronic pain: Secondary | ICD-10-CM | POA: Diagnosis not present

## 2020-02-26 DIAGNOSIS — R29898 Other symptoms and signs involving the musculoskeletal system: Secondary | ICD-10-CM | POA: Diagnosis not present

## 2020-02-26 DIAGNOSIS — M6281 Muscle weakness (generalized): Secondary | ICD-10-CM | POA: Diagnosis not present

## 2020-02-26 DIAGNOSIS — H3530 Unspecified macular degeneration: Secondary | ICD-10-CM | POA: Diagnosis not present

## 2020-02-26 DIAGNOSIS — R296 Repeated falls: Secondary | ICD-10-CM | POA: Diagnosis not present

## 2020-02-26 DIAGNOSIS — K219 Gastro-esophageal reflux disease without esophagitis: Secondary | ICD-10-CM | POA: Diagnosis not present

## 2020-02-26 DIAGNOSIS — R4189 Other symptoms and signs involving cognitive functions and awareness: Secondary | ICD-10-CM | POA: Diagnosis not present

## 2020-02-26 DIAGNOSIS — F0280 Dementia in other diseases classified elsewhere without behavioral disturbance: Secondary | ICD-10-CM | POA: Diagnosis not present

## 2020-02-26 DIAGNOSIS — I4819 Other persistent atrial fibrillation: Secondary | ICD-10-CM | POA: Diagnosis not present

## 2020-02-27 DIAGNOSIS — I4819 Other persistent atrial fibrillation: Secondary | ICD-10-CM | POA: Diagnosis not present

## 2020-02-27 DIAGNOSIS — H3530 Unspecified macular degeneration: Secondary | ICD-10-CM | POA: Diagnosis not present

## 2020-02-27 DIAGNOSIS — R29898 Other symptoms and signs involving the musculoskeletal system: Secondary | ICD-10-CM | POA: Diagnosis not present

## 2020-02-27 DIAGNOSIS — G8929 Other chronic pain: Secondary | ICD-10-CM | POA: Diagnosis not present

## 2020-02-27 DIAGNOSIS — R296 Repeated falls: Secondary | ICD-10-CM | POA: Diagnosis not present

## 2020-02-27 DIAGNOSIS — R4189 Other symptoms and signs involving cognitive functions and awareness: Secondary | ICD-10-CM | POA: Diagnosis not present

## 2020-02-27 DIAGNOSIS — K219 Gastro-esophageal reflux disease without esophagitis: Secondary | ICD-10-CM | POA: Diagnosis not present

## 2020-02-27 DIAGNOSIS — F0280 Dementia in other diseases classified elsewhere without behavioral disturbance: Secondary | ICD-10-CM | POA: Diagnosis not present

## 2020-02-27 DIAGNOSIS — M6281 Muscle weakness (generalized): Secondary | ICD-10-CM | POA: Diagnosis not present

## 2020-02-28 ENCOUNTER — Non-Acute Institutional Stay (SKILLED_NURSING_FACILITY): Payer: Medicare PPO | Admitting: Nurse Practitioner

## 2020-02-28 ENCOUNTER — Encounter: Payer: Self-pay | Admitting: Nurse Practitioner

## 2020-02-28 DIAGNOSIS — N184 Chronic kidney disease, stage 4 (severe): Secondary | ICD-10-CM

## 2020-02-28 DIAGNOSIS — K219 Gastro-esophageal reflux disease without esophagitis: Secondary | ICD-10-CM

## 2020-02-28 DIAGNOSIS — L551 Sunburn of second degree: Secondary | ICD-10-CM

## 2020-02-28 DIAGNOSIS — I10 Essential (primary) hypertension: Secondary | ICD-10-CM

## 2020-02-28 DIAGNOSIS — F0393 Unspecified dementia, unspecified severity, with mood disturbance: Secondary | ICD-10-CM

## 2020-02-28 DIAGNOSIS — M545 Low back pain, unspecified: Secondary | ICD-10-CM

## 2020-02-28 DIAGNOSIS — D649 Anemia, unspecified: Secondary | ICD-10-CM | POA: Diagnosis not present

## 2020-02-28 DIAGNOSIS — M6281 Muscle weakness (generalized): Secondary | ICD-10-CM | POA: Diagnosis not present

## 2020-02-28 DIAGNOSIS — K5901 Slow transit constipation: Secondary | ICD-10-CM

## 2020-02-28 DIAGNOSIS — G8929 Other chronic pain: Secondary | ICD-10-CM

## 2020-02-28 DIAGNOSIS — H3530 Unspecified macular degeneration: Secondary | ICD-10-CM | POA: Diagnosis not present

## 2020-02-28 DIAGNOSIS — R4189 Other symptoms and signs involving cognitive functions and awareness: Secondary | ICD-10-CM | POA: Diagnosis not present

## 2020-02-28 DIAGNOSIS — F329 Major depressive disorder, single episode, unspecified: Secondary | ICD-10-CM

## 2020-02-28 DIAGNOSIS — J42 Unspecified chronic bronchitis: Secondary | ICD-10-CM

## 2020-02-28 DIAGNOSIS — I4819 Other persistent atrial fibrillation: Secondary | ICD-10-CM | POA: Diagnosis not present

## 2020-02-28 DIAGNOSIS — E222 Syndrome of inappropriate secretion of antidiuretic hormone: Secondary | ICD-10-CM

## 2020-02-28 DIAGNOSIS — F0280 Dementia in other diseases classified elsewhere without behavioral disturbance: Secondary | ICD-10-CM | POA: Diagnosis not present

## 2020-02-28 DIAGNOSIS — R29898 Other symptoms and signs involving the musculoskeletal system: Secondary | ICD-10-CM | POA: Diagnosis not present

## 2020-02-28 DIAGNOSIS — R296 Repeated falls: Secondary | ICD-10-CM | POA: Diagnosis not present

## 2020-02-28 DIAGNOSIS — I5189 Other ill-defined heart diseases: Secondary | ICD-10-CM

## 2020-02-28 NOTE — Progress Notes (Signed)
Location:   [facility] Room Number: 34 Place of Service:  SNF (31) Provider:  Marda Stalker, Lennie Odor NP   [name], MD  Patient Care Team: [name], MD as PCP - General (Internal Medicine) [name], MD as PCP - Cardiology (Cardiology) [name], MD as Consulting Physician (Internal Medicine) Ngetich, Nelda Bucks, NP as Nurse Practitioner (Family Medicine)  Extended Emergency Contact Information Primary Emergency Contact: Dezeeuw,Bob Address: [address]           [place], [place] Phone: [phone] Relation: Son Secondary Emergency Contact: Philmore [place] Phone: [phone] Mobile Phone: [phone] Relation: Daughter  Code Status:  DNR Goals of care: Advanced Directive information Advanced Directives 01/18/2020  Does Patient Have a Medical Advance Directive? Yes  Type of Paramedic of [place];Out of facility DNR (pink MOST or yellow form);Living will  Does patient want to make changes to medical advance directive? No - Patient declined  Copy of [place] in Chart? Yes - validated most recent copy scanned in chart (See row information)  Would patient like information on creating a medical advance directive? -  Pre-existing out of facility DNR order (yellow form or pink MOST form) -     Chief Complaint  Patient presents with  . Medical Management of Chronic Issues  . Health Maintenance    TDAP, Dexa scan    HPI:  Pt is a 86 y.o. female seen today for medical management of chronic diseases.    The patient resides in SNF [facility] for safety, care assistance, w/c for mobility, on Memantine 5mg  bid for memory.  Hx of OA, multiple sites, pain is controlled on Gabapentin 25mg  qd, 50mg  qd. Tylenol 650mg  bid. Hx of COPD, stable, on Symbicort bid. Her mood is stable, on Wellbutrin 150mg  qd. HTN, blood pressure is controlled on  Carvedilol 12.5mg  bid. Hyponatremia, stable, on Demeclocycline 150mg  bid. GERD, stable, on Famotidine 20mg  qd, Pantoprazole 40mg  qd.  Anemia, stable, on Fe. Constipation, stable, on MiraLax qod. CHF, minimal edema BLE, on Torsemide 20mg  qd.    Past Medical History:  Diagnosis Date  . Amputation of finger of right hand   . Aortic stenosis, mild 03/03/2016   By echo 08/2015  . Arthritis   . Back pain   . Bradycardia 08/28/2015  . CKD (chronic kidney disease) stage 3, GFR 30-59 ml/min   . Closed right hip fracture ([place]) 12/2018  . Depression   . GERD (gastroesophageal reflux disease)   . GI bleed due to NSAIDs   . Hyperlipidemia   . Hypertension   . Macular degeneration   . Maxillary sinusitis   . MVP (mitral valve prolapse) 03/03/2016   Bileaflet MVP with mild MR by echo 2016  . Old MI (myocardial infarction)    NSTEMI secondary to stress MI/Takotsubo CM, minimal nonobstructive ASCAD at cath  . Osteoporosis   . Persistent atrial fibrillation ([place]) 2006   not on anticoagulation due to GI bleed and increased fall risk  . PMR (polymyalgia rheumatica) (HCC)   . SIADH (syndrome of inappropriate ADH production) ([place])   . Takotsubo cardiomyopathy    EF normalized to 70%  . Ventricular tachycardia ([place])    on initial presentation of MI   Past Surgical History:  Procedure Laterality Date  . CARDIAC CATHETERIZATION     normal coronary arteries  . CHOLECYSTECTOMY  11/10/2011   Procedure: LAPAROSCOPIC CHOLECYSTECTOMY WITH INTRAOPERATIVE  CHOLANGIOGRAM;  Surgeon: Pedro Earls, MD;  Location: [facility];  Service: General;  Laterality: N/A;  . EYE SURGERY  06/10/11   membrane peel   . INTRAMEDULLARY (IM) NAIL INTERTROCHANTERIC Right 12/21/2018   Procedure: INTRAMEDULLARY (IM) NAIL INTERTROCHANTRIC;  Surgeon: Nicholes Stairs, MD;  Location: [facility];  Service: Orthopedics;  Laterality: Right;  . NASAL SINUS SURGERY Left   . ROTATOR CUFF REPAIR  1998  . UMBILICAL HERNIA REPAIR  11/10/2011    Procedure: HERNIA REPAIR UMBILICAL ADULT;  Surgeon: Pedro Earls, MD;  Location: [facility];  Service: General;  Laterality: N/A;  . WISDOM TOOTH EXTRACTION      Allergies  Allergen Reactions  . Amlodipine Swelling    To feet and ankles   . Avelox [Moxifloxacin Hcl In Nacl] Other (See Comments)    Pt does not remember   . Biaxin [Clarithromycin]   . Ceftin [Cefuroxime Axetil]   . Duragesic Disc Transdermal System [Fentanyl] Other (See Comments)    Reaction unknown  . Forteo [Parathyroid Hormone (Recomb)] Other (See Comments)    Made skin dry  . Morphine And Related Other (See Comments)    "My Sister and daughter can,t take it. I've had it before without problems."  . Teriparatide   . Lidocaine Rash    Allergies as of 02/28/2020      Reactions   Amlodipine Swelling   To feet and ankles    Avelox [moxifloxacin Hcl In Nacl] Other (See Comments)   Pt does not remember   Biaxin [clarithromycin]    Ceftin [cefuroxime Axetil]    Duragesic Disc Transdermal System [fentanyl] Other (See Comments)   Reaction unknown   Forteo [parathyroid Hormone (recomb)] Other (See Comments)   Made skin dry   Morphine And Related Other (See Comments)   "My Sister and daughter can,t take it. I've had it before without problems."   Teriparatide    Lidocaine Rash      Medication List       Accurate as of Feb 28, 2020 11:59 PM. If you have any questions, ask your nurse or doctor.        acetaminophen 500 MG tablet Commonly known as: TYLENOL Take 500 mg by mouth every 6 (six) hours as needed. Every 6hrs.   acetaminophen 325 MG tablet Commonly known as: TYLENOL Take 650 mg by mouth 2 (two) times daily.   albuterol (2.5 MG/3ML) 0.083% nebulizer solution Commonly known as: PROVENTIL Take 2.5 mg by nebulization every 6 (six) hours as needed for wheezing or shortness of breath.   amLODipine 5 MG tablet Commonly known as: NORVASC TAKE 1 TABLET BY MOUTH EVERY DAY   azelastine 0.1 % nasal  spray Commonly known as: ASTELIN Place 2 sprays into both nostrils 2 (two) times daily as needed for allergies.   budesonide-formoterol 80-4.5 MCG/ACT inhaler Commonly known as: SYMBICORT Inhale 2 puffs into the lungs 2 (two) times daily.   buPROPion 150 MG 12 hr tablet Commonly known as: WELLBUTRIN SR Take 1 tablet (150 mg total) by mouth every morning.   carvedilol 12.5 MG tablet Commonly known as: COREG Take 1 tablet (12.5 mg total) by mouth 2 (two) times daily with a meal.   cetirizine 10 MG tablet Commonly known as: ZYRTEC Take 10 mg by mouth daily.   demeclocycline 150 MG tablet Commonly known as: DECLOMYCIN Take 150 mg by mouth 2 (two) times daily.   diclofenac sodium 1 % Gel Commonly known as: VOLTAREN Apply 1 application topically 4 (  four) times daily as needed (pain).   famotidine 20 MG tablet Commonly known as: PEPCID Take 20 mg by mouth daily.   feeding supplement (PRO-STAT SUGAR FREE 64) Liqd Take 30 mLs by mouth daily.   fluticasone 50 MCG/ACT nasal spray Commonly known as: FLONASE Place into both nostrils daily. 2 Sprays in both nostrils.   Iron 325 (65 Fe) MG Tabs Take 1 tablet by mouth daily. Give with meals   lactose free nutrition Liqd Take 237 mLs by mouth 2 (two) times daily. Between Meals 10am-6pm.   Lubricating Eye Drops 0.5-0.9 % ophthalmic solution Generic drug: carboxymethylcellul-glycerin Place 1 drop into both eyes 3 (three) times daily as needed for dry eyes. For allergies   memantine 5 MG tablet Commonly known as: NAMENDA Take 5 mg by mouth 2 (two) times daily.   ondansetron 4 MG tablet Commonly known as: ZOFRAN Take 1 tablet (4 mg total) by mouth every 6 (six) hours as needed for nausea.   pantoprazole 40 MG tablet Commonly known as: PROTONIX Take 40 mg by mouth daily.   polyethylene glycol 17 g packet Commonly known as: MIRALAX / GLYCOLAX Take 17 g by mouth every other day.   pregabalin 50 MG capsule Commonly known as:  LYRICA Take 1 capsule (50 mg total) by mouth at bedtime.   pregabalin 25 MG capsule Commonly known as: Lyrica Take 1 capsule (25 mg total) by mouth daily.   torsemide 20 MG tablet Commonly known as: DEMADEX Take 20 mg by mouth once.   zinc oxide 20 % ointment Apply 1 application topically as needed for irritation. To buttocks after every incontinent episode and as needed for redness. May keep at bedside       Review of Systems  Constitutional: Negative for activity change, appetite change and fever.       Gradual weight gain since admitted to SNF [facility], #7Ibs weight gain in the past month.   HENT: Positive for hearing loss. Negative for congestion and voice change.   Eyes: Negative for visual disturbance.  Respiratory: Negative for cough and shortness of breath.   Cardiovascular: Positive for leg swelling. Negative for chest pain and palpitations.  Gastrointestinal: Negative for abdominal pain and constipation.       C/o nauseated this morning, but better now, did not vomit, c/o loose stools.   Genitourinary: Negative for difficulty urinating, dysuria and urgency.  Musculoskeletal: Positive for arthralgias, back pain, gait problem and myalgias.       Right hip, lower back.   Skin: Positive for wound. Negative for color change.  Neurological: Negative for speech difficulty, weakness, light-headedness and headaches.       Memory lapses.   Psychiatric/Behavioral: Negative for agitation, behavioral problems and sleep disturbance.    Immunization History  Administered Date(s) Administered  . DTaP 12/14/2011  . Influenza, High Dose Seasonal PF 07/12/2017, 08/03/2019  . Moderna SARS-COVID-2 Vaccination 10/23/2019, 11/18/2019  . Pneumococcal Conjugate-13 06/01/2014  . Pneumococcal Polysaccharide-23 06/11/1999   Pertinent  Health Maintenance Due  Topic Date Due  . DEXA SCAN  Never done  . INFLUENZA VACCINE  05/19/2020  . PNA vac Low Risk Adult  Completed   No flowsheet data  found. Functional Status Survey:    Vitals:   02/28/20 0959  BP: 120/60  Pulse: 70  Resp: 20  Temp: (!) 96.4 F (35.8 C)  SpO2: 95%  Weight: 132 lb (59.9 kg)  Height: 4\' 6"  (1.372 m)   Body mass index is 31.83 kg/m. Physical Exam  Vitals and nursing note reviewed.  Constitutional:      Appearance: Normal appearance.  HENT:     Head: Normocephalic and atraumatic.     Mouth/Throat:     Mouth: Mucous membranes are moist.  Eyes:     Extraocular Movements: Extraocular movements intact.     Conjunctiva/sclera: Conjunctivae normal.     Pupils: Pupils are equal, round, and reactive to light.  Cardiovascular:     Rate and Rhythm: Normal rate and regular rhythm.     Heart sounds: Murmur present.     Comments: HR in 60 upon my examination Pulmonary:     Breath sounds: Rales present. No wheezing.     Comments: Bibasilar rales.  Abdominal:     General: Bowel sounds are normal.     Tenderness: There is no abdominal tenderness. There is no guarding or rebound.  Musculoskeletal:     Cervical back: Normal range of motion and neck supple.     Right lower leg: Edema present.     Left lower leg: Edema present.     Comments: Trace edema BLE, mostly in feet/ankle. Chronic pain in the lower back, right hip.  Skin:    General: Skin is warm and dry.     Comments: Peeling left fore arm/hand, sunburn wounds dorsum the left 2nd, 3rd fingers.  Neurological:     General: No focal deficit present.     Mental Status: She is alert. Mental status is at baseline.     Motor: No weakness.     Gait: Gait abnormal.     Comments: Oriented to person, place.   Psychiatric:        Mood and Affect: Mood normal.        Behavior: Behavior normal.     Labs reviewed: Recent Labs    12/07/19 0000 12/21/19 0000 12/26/19 0000  NA 142 142 144  K 4.5 4.3 4.0  CL 103 103 106  CO2 31* 32* 29*  BUN 47* 48* 50*  CREATININE 1.6* 1.3* 1.3*  CALCIUM 9.2 9.5 9.0   Recent Labs    11/14/19 0000  11/23/19 0000 12/26/19 0000  AST 19 15 16   ALT 15 13 15   ALKPHOS 87 82 108  ALBUMIN 3.4* 3.0* 3.0*   Recent Labs    03/30/19 0000 11/14/19 0000 12/26/19 0000  WBC 7.9 6.5 6.8  NEUTROABS  --  3,634 3,903  HGB 9.8* 12.3 11.5*  HCT 30* 38 35*  PLT 532* 266 287   Lab Results  Component Value Date   TSH 2.600 09/19/2018   No results found for: HGBA1C No results found for: CHOL, HDL, LDLCALC, LDLDIRECT, TRIG, CHOLHDL  Significant Diagnostic Results in last 30 days:  No results found.  Assessment/Plan Essential hypertension, benign Blood pressure is controlled, continue Carvedilol.   Persistent atrial fibrillation (HCC) Heart rate is in control.   Chronic bronchitis (HCC) Stable, continue Symbicort.   Esophageal reflux Stable, continue Famotidine, Pantoprazole.   Slow transit constipation Stable, continue MiraLax qod  SIADH (syndrome of inappropriate ADH production) (HCC) Stable, continue Demeclocycline.   Depression due to dementia [facility]) Her mood is stable, continue Wellbutrin  CKD (chronic kidney disease) stage 4, GFR 15-29 ml/min (HCC) Baseline creat 1.3  Anemia Stable, continue Fe  Back pain Back, hip, pain is better controlled, continue Gabapentin, Tylenol.   Cognitive impairment Continue SNF [facility] for safety, care assistnce, continue Mementine, no behavioral issues.   Diastolic dysfunction Compensated, chronic edema BLE, continue Torsemide.   Sunburn  of second degree Peeling left fore arm/hand, sunburn wounds dorsum the left 2nd, 3rd fingers. Will apply Silvadene cream bid to wounds x 2 weeks.Demeclocycline may be contributory to photosensitive/sunburn easily.      Family/ staff Communication: plan of care reviewed with the patient and charge nurse.   Labs/tests ordered:  None  Time spend 25 minutes.

## 2020-02-28 NOTE — Assessment & Plan Note (Signed)
Stable, continue Famotidine, Pantoprazole.  

## 2020-02-28 NOTE — Assessment & Plan Note (Signed)
Back, hip, pain is better controlled, continue Gabapentin, Tylenol.

## 2020-02-28 NOTE — Assessment & Plan Note (Signed)
Stable, continue Symbicort.

## 2020-02-28 NOTE — Assessment & Plan Note (Signed)
Stable, continue Fe 

## 2020-02-28 NOTE — Assessment & Plan Note (Signed)
Stable, continue Demeclocycline.

## 2020-02-28 NOTE — Assessment & Plan Note (Signed)
Her mood is stable, continue Wellbutrin

## 2020-02-28 NOTE — Assessment & Plan Note (Signed)
Compensated, chronic edema BLE, continue Torsemide.

## 2020-02-28 NOTE — Assessment & Plan Note (Signed)
Continue SNF [facility] for safety, care assistnce, continue Mementine, no behavioral issues.

## 2020-02-28 NOTE — Assessment & Plan Note (Signed)
Blood pressure is controlled, continue Carvedilol.

## 2020-02-28 NOTE — Assessment & Plan Note (Signed)
Stable, continue MiraLax qod

## 2020-02-28 NOTE — Assessment & Plan Note (Signed)
Baseline creat 1.3

## 2020-02-28 NOTE — Assessment & Plan Note (Signed)
Heart rate is in control.  

## 2020-02-29 ENCOUNTER — Encounter: Payer: Self-pay | Admitting: Nurse Practitioner

## 2020-02-29 DIAGNOSIS — R4189 Other symptoms and signs involving cognitive functions and awareness: Secondary | ICD-10-CM | POA: Diagnosis not present

## 2020-02-29 DIAGNOSIS — F0280 Dementia in other diseases classified elsewhere without behavioral disturbance: Secondary | ICD-10-CM | POA: Diagnosis not present

## 2020-02-29 DIAGNOSIS — R29898 Other symptoms and signs involving the musculoskeletal system: Secondary | ICD-10-CM | POA: Diagnosis not present

## 2020-02-29 DIAGNOSIS — L551 Sunburn of second degree: Secondary | ICD-10-CM | POA: Insufficient documentation

## 2020-02-29 DIAGNOSIS — R296 Repeated falls: Secondary | ICD-10-CM | POA: Diagnosis not present

## 2020-02-29 DIAGNOSIS — H3530 Unspecified macular degeneration: Secondary | ICD-10-CM | POA: Diagnosis not present

## 2020-02-29 DIAGNOSIS — I4819 Other persistent atrial fibrillation: Secondary | ICD-10-CM | POA: Diagnosis not present

## 2020-02-29 DIAGNOSIS — G8929 Other chronic pain: Secondary | ICD-10-CM | POA: Diagnosis not present

## 2020-02-29 DIAGNOSIS — K219 Gastro-esophageal reflux disease without esophagitis: Secondary | ICD-10-CM | POA: Diagnosis not present

## 2020-02-29 DIAGNOSIS — M6281 Muscle weakness (generalized): Secondary | ICD-10-CM | POA: Diagnosis not present

## 2020-02-29 NOTE — Assessment & Plan Note (Signed)
Peeling left fore arm/hand, sunburn wounds dorsum the left 2nd, 3rd fingers. Will apply Silvadene cream bid to wounds x 2 weeks.Demeclocycline may be contributory to photosensitive/sunburn easily.

## 2020-03-01 DIAGNOSIS — R29898 Other symptoms and signs involving the musculoskeletal system: Secondary | ICD-10-CM | POA: Diagnosis not present

## 2020-03-01 DIAGNOSIS — G8929 Other chronic pain: Secondary | ICD-10-CM | POA: Diagnosis not present

## 2020-03-01 DIAGNOSIS — R296 Repeated falls: Secondary | ICD-10-CM | POA: Diagnosis not present

## 2020-03-01 DIAGNOSIS — R4189 Other symptoms and signs involving cognitive functions and awareness: Secondary | ICD-10-CM | POA: Diagnosis not present

## 2020-03-01 DIAGNOSIS — K219 Gastro-esophageal reflux disease without esophagitis: Secondary | ICD-10-CM | POA: Diagnosis not present

## 2020-03-01 DIAGNOSIS — M6281 Muscle weakness (generalized): Secondary | ICD-10-CM | POA: Diagnosis not present

## 2020-03-01 DIAGNOSIS — I4819 Other persistent atrial fibrillation: Secondary | ICD-10-CM | POA: Diagnosis not present

## 2020-03-01 DIAGNOSIS — F0280 Dementia in other diseases classified elsewhere without behavioral disturbance: Secondary | ICD-10-CM | POA: Diagnosis not present

## 2020-03-01 DIAGNOSIS — H3530 Unspecified macular degeneration: Secondary | ICD-10-CM | POA: Diagnosis not present

## 2020-03-04 DIAGNOSIS — G8929 Other chronic pain: Secondary | ICD-10-CM | POA: Diagnosis not present

## 2020-03-04 DIAGNOSIS — H3530 Unspecified macular degeneration: Secondary | ICD-10-CM | POA: Diagnosis not present

## 2020-03-04 DIAGNOSIS — R29898 Other symptoms and signs involving the musculoskeletal system: Secondary | ICD-10-CM | POA: Diagnosis not present

## 2020-03-04 DIAGNOSIS — R296 Repeated falls: Secondary | ICD-10-CM | POA: Diagnosis not present

## 2020-03-04 DIAGNOSIS — M6281 Muscle weakness (generalized): Secondary | ICD-10-CM | POA: Diagnosis not present

## 2020-03-04 DIAGNOSIS — K219 Gastro-esophageal reflux disease without esophagitis: Secondary | ICD-10-CM | POA: Diagnosis not present

## 2020-03-04 DIAGNOSIS — R4189 Other symptoms and signs involving cognitive functions and awareness: Secondary | ICD-10-CM | POA: Diagnosis not present

## 2020-03-04 DIAGNOSIS — I4819 Other persistent atrial fibrillation: Secondary | ICD-10-CM | POA: Diagnosis not present

## 2020-03-04 DIAGNOSIS — F0280 Dementia in other diseases classified elsewhere without behavioral disturbance: Secondary | ICD-10-CM | POA: Diagnosis not present

## 2020-03-05 DIAGNOSIS — M6281 Muscle weakness (generalized): Secondary | ICD-10-CM | POA: Diagnosis not present

## 2020-03-05 DIAGNOSIS — K219 Gastro-esophageal reflux disease without esophagitis: Secondary | ICD-10-CM | POA: Diagnosis not present

## 2020-03-05 DIAGNOSIS — R29898 Other symptoms and signs involving the musculoskeletal system: Secondary | ICD-10-CM | POA: Diagnosis not present

## 2020-03-05 DIAGNOSIS — R296 Repeated falls: Secondary | ICD-10-CM | POA: Diagnosis not present

## 2020-03-05 DIAGNOSIS — R4189 Other symptoms and signs involving cognitive functions and awareness: Secondary | ICD-10-CM | POA: Diagnosis not present

## 2020-03-05 DIAGNOSIS — H3530 Unspecified macular degeneration: Secondary | ICD-10-CM | POA: Diagnosis not present

## 2020-03-05 DIAGNOSIS — G8929 Other chronic pain: Secondary | ICD-10-CM | POA: Diagnosis not present

## 2020-03-05 DIAGNOSIS — F0280 Dementia in other diseases classified elsewhere without behavioral disturbance: Secondary | ICD-10-CM | POA: Diagnosis not present

## 2020-03-05 DIAGNOSIS — I4819 Other persistent atrial fibrillation: Secondary | ICD-10-CM | POA: Diagnosis not present

## 2020-03-06 ENCOUNTER — Other Ambulatory Visit: Payer: Self-pay

## 2020-03-06 DIAGNOSIS — I4819 Other persistent atrial fibrillation: Secondary | ICD-10-CM | POA: Diagnosis not present

## 2020-03-06 DIAGNOSIS — F0280 Dementia in other diseases classified elsewhere without behavioral disturbance: Secondary | ICD-10-CM | POA: Diagnosis not present

## 2020-03-06 DIAGNOSIS — R29898 Other symptoms and signs involving the musculoskeletal system: Secondary | ICD-10-CM | POA: Diagnosis not present

## 2020-03-06 DIAGNOSIS — R4189 Other symptoms and signs involving cognitive functions and awareness: Secondary | ICD-10-CM | POA: Diagnosis not present

## 2020-03-06 DIAGNOSIS — M6281 Muscle weakness (generalized): Secondary | ICD-10-CM | POA: Diagnosis not present

## 2020-03-06 DIAGNOSIS — K219 Gastro-esophageal reflux disease without esophagitis: Secondary | ICD-10-CM | POA: Diagnosis not present

## 2020-03-06 DIAGNOSIS — H3530 Unspecified macular degeneration: Secondary | ICD-10-CM | POA: Diagnosis not present

## 2020-03-06 DIAGNOSIS — R296 Repeated falls: Secondary | ICD-10-CM | POA: Diagnosis not present

## 2020-03-06 DIAGNOSIS — G8929 Other chronic pain: Secondary | ICD-10-CM | POA: Diagnosis not present

## 2020-03-06 MED ORDER — PREGABALIN 25 MG PO CAPS: 25.0000 mg | ORAL_CAPSULE | Freq: Every day | ORAL | 2 refills | Status: DC

## 2020-03-06 NOTE — Telephone Encounter (Signed)
Received fax refill request from [facility] and sent to Dr. Lyndel Safe for approval.

## 2020-03-07 DIAGNOSIS — H3530 Unspecified macular degeneration: Secondary | ICD-10-CM | POA: Diagnosis not present

## 2020-03-07 DIAGNOSIS — F0280 Dementia in other diseases classified elsewhere without behavioral disturbance: Secondary | ICD-10-CM | POA: Diagnosis not present

## 2020-03-07 DIAGNOSIS — M6281 Muscle weakness (generalized): Secondary | ICD-10-CM | POA: Diagnosis not present

## 2020-03-07 DIAGNOSIS — R29898 Other symptoms and signs involving the musculoskeletal system: Secondary | ICD-10-CM | POA: Diagnosis not present

## 2020-03-07 DIAGNOSIS — R4189 Other symptoms and signs involving cognitive functions and awareness: Secondary | ICD-10-CM | POA: Diagnosis not present

## 2020-03-07 DIAGNOSIS — K219 Gastro-esophageal reflux disease without esophagitis: Secondary | ICD-10-CM | POA: Diagnosis not present

## 2020-03-07 DIAGNOSIS — G8929 Other chronic pain: Secondary | ICD-10-CM | POA: Diagnosis not present

## 2020-03-07 DIAGNOSIS — R296 Repeated falls: Secondary | ICD-10-CM | POA: Diagnosis not present

## 2020-03-07 DIAGNOSIS — I4819 Other persistent atrial fibrillation: Secondary | ICD-10-CM | POA: Diagnosis not present

## 2020-03-08 DIAGNOSIS — R29898 Other symptoms and signs involving the musculoskeletal system: Secondary | ICD-10-CM | POA: Diagnosis not present

## 2020-03-08 DIAGNOSIS — H3530 Unspecified macular degeneration: Secondary | ICD-10-CM | POA: Diagnosis not present

## 2020-03-08 DIAGNOSIS — K219 Gastro-esophageal reflux disease without esophagitis: Secondary | ICD-10-CM | POA: Diagnosis not present

## 2020-03-08 DIAGNOSIS — G8929 Other chronic pain: Secondary | ICD-10-CM | POA: Diagnosis not present

## 2020-03-08 DIAGNOSIS — R4189 Other symptoms and signs involving cognitive functions and awareness: Secondary | ICD-10-CM | POA: Diagnosis not present

## 2020-03-08 DIAGNOSIS — M6281 Muscle weakness (generalized): Secondary | ICD-10-CM | POA: Diagnosis not present

## 2020-03-08 DIAGNOSIS — I4819 Other persistent atrial fibrillation: Secondary | ICD-10-CM | POA: Diagnosis not present

## 2020-03-08 DIAGNOSIS — R296 Repeated falls: Secondary | ICD-10-CM | POA: Diagnosis not present

## 2020-03-08 DIAGNOSIS — F0280 Dementia in other diseases classified elsewhere without behavioral disturbance: Secondary | ICD-10-CM | POA: Diagnosis not present

## 2020-03-11 DIAGNOSIS — I4819 Other persistent atrial fibrillation: Secondary | ICD-10-CM | POA: Diagnosis not present

## 2020-03-11 DIAGNOSIS — H3530 Unspecified macular degeneration: Secondary | ICD-10-CM | POA: Diagnosis not present

## 2020-03-11 DIAGNOSIS — R29898 Other symptoms and signs involving the musculoskeletal system: Secondary | ICD-10-CM | POA: Diagnosis not present

## 2020-03-11 DIAGNOSIS — G8929 Other chronic pain: Secondary | ICD-10-CM | POA: Diagnosis not present

## 2020-03-11 DIAGNOSIS — M6281 Muscle weakness (generalized): Secondary | ICD-10-CM | POA: Diagnosis not present

## 2020-03-11 DIAGNOSIS — R4189 Other symptoms and signs involving cognitive functions and awareness: Secondary | ICD-10-CM | POA: Diagnosis not present

## 2020-03-11 DIAGNOSIS — K219 Gastro-esophageal reflux disease without esophagitis: Secondary | ICD-10-CM | POA: Diagnosis not present

## 2020-03-11 DIAGNOSIS — F0280 Dementia in other diseases classified elsewhere without behavioral disturbance: Secondary | ICD-10-CM | POA: Diagnosis not present

## 2020-03-11 DIAGNOSIS — R296 Repeated falls: Secondary | ICD-10-CM | POA: Diagnosis not present

## 2020-03-12 DIAGNOSIS — K219 Gastro-esophageal reflux disease without esophagitis: Secondary | ICD-10-CM | POA: Diagnosis not present

## 2020-03-12 DIAGNOSIS — H3530 Unspecified macular degeneration: Secondary | ICD-10-CM | POA: Diagnosis not present

## 2020-03-12 DIAGNOSIS — I4819 Other persistent atrial fibrillation: Secondary | ICD-10-CM | POA: Diagnosis not present

## 2020-03-12 DIAGNOSIS — G8929 Other chronic pain: Secondary | ICD-10-CM | POA: Diagnosis not present

## 2020-03-12 DIAGNOSIS — M6281 Muscle weakness (generalized): Secondary | ICD-10-CM | POA: Diagnosis not present

## 2020-03-12 DIAGNOSIS — R296 Repeated falls: Secondary | ICD-10-CM | POA: Diagnosis not present

## 2020-03-12 DIAGNOSIS — R4189 Other symptoms and signs involving cognitive functions and awareness: Secondary | ICD-10-CM | POA: Diagnosis not present

## 2020-03-12 DIAGNOSIS — F0280 Dementia in other diseases classified elsewhere without behavioral disturbance: Secondary | ICD-10-CM | POA: Diagnosis not present

## 2020-03-12 DIAGNOSIS — R29898 Other symptoms and signs involving the musculoskeletal system: Secondary | ICD-10-CM | POA: Diagnosis not present

## 2020-03-13 DIAGNOSIS — M6281 Muscle weakness (generalized): Secondary | ICD-10-CM | POA: Diagnosis not present

## 2020-03-13 DIAGNOSIS — R296 Repeated falls: Secondary | ICD-10-CM | POA: Diagnosis not present

## 2020-03-13 DIAGNOSIS — R29898 Other symptoms and signs involving the musculoskeletal system: Secondary | ICD-10-CM | POA: Diagnosis not present

## 2020-03-13 DIAGNOSIS — F0280 Dementia in other diseases classified elsewhere without behavioral disturbance: Secondary | ICD-10-CM | POA: Diagnosis not present

## 2020-03-13 DIAGNOSIS — R4189 Other symptoms and signs involving cognitive functions and awareness: Secondary | ICD-10-CM | POA: Diagnosis not present

## 2020-03-13 DIAGNOSIS — H3530 Unspecified macular degeneration: Secondary | ICD-10-CM | POA: Diagnosis not present

## 2020-03-13 DIAGNOSIS — K219 Gastro-esophageal reflux disease without esophagitis: Secondary | ICD-10-CM | POA: Diagnosis not present

## 2020-03-13 DIAGNOSIS — I4819 Other persistent atrial fibrillation: Secondary | ICD-10-CM | POA: Diagnosis not present

## 2020-03-13 DIAGNOSIS — G8929 Other chronic pain: Secondary | ICD-10-CM | POA: Diagnosis not present

## 2020-03-14 DIAGNOSIS — K219 Gastro-esophageal reflux disease without esophagitis: Secondary | ICD-10-CM | POA: Diagnosis not present

## 2020-03-14 DIAGNOSIS — R296 Repeated falls: Secondary | ICD-10-CM | POA: Diagnosis not present

## 2020-03-14 DIAGNOSIS — M6281 Muscle weakness (generalized): Secondary | ICD-10-CM | POA: Diagnosis not present

## 2020-03-14 DIAGNOSIS — H3530 Unspecified macular degeneration: Secondary | ICD-10-CM | POA: Diagnosis not present

## 2020-03-14 DIAGNOSIS — G8929 Other chronic pain: Secondary | ICD-10-CM | POA: Diagnosis not present

## 2020-03-14 DIAGNOSIS — F0280 Dementia in other diseases classified elsewhere without behavioral disturbance: Secondary | ICD-10-CM | POA: Diagnosis not present

## 2020-03-14 DIAGNOSIS — R4189 Other symptoms and signs involving cognitive functions and awareness: Secondary | ICD-10-CM | POA: Diagnosis not present

## 2020-03-14 DIAGNOSIS — I4819 Other persistent atrial fibrillation: Secondary | ICD-10-CM | POA: Diagnosis not present

## 2020-03-14 DIAGNOSIS — R29898 Other symptoms and signs involving the musculoskeletal system: Secondary | ICD-10-CM | POA: Diagnosis not present

## 2020-03-15 DIAGNOSIS — H3530 Unspecified macular degeneration: Secondary | ICD-10-CM | POA: Diagnosis not present

## 2020-03-15 DIAGNOSIS — G8929 Other chronic pain: Secondary | ICD-10-CM | POA: Diagnosis not present

## 2020-03-15 DIAGNOSIS — F0280 Dementia in other diseases classified elsewhere without behavioral disturbance: Secondary | ICD-10-CM | POA: Diagnosis not present

## 2020-03-15 DIAGNOSIS — R4189 Other symptoms and signs involving cognitive functions and awareness: Secondary | ICD-10-CM | POA: Diagnosis not present

## 2020-03-15 DIAGNOSIS — R29898 Other symptoms and signs involving the musculoskeletal system: Secondary | ICD-10-CM | POA: Diagnosis not present

## 2020-03-15 DIAGNOSIS — R296 Repeated falls: Secondary | ICD-10-CM | POA: Diagnosis not present

## 2020-03-15 DIAGNOSIS — I4819 Other persistent atrial fibrillation: Secondary | ICD-10-CM | POA: Diagnosis not present

## 2020-03-15 DIAGNOSIS — K219 Gastro-esophageal reflux disease without esophagitis: Secondary | ICD-10-CM | POA: Diagnosis not present

## 2020-03-15 DIAGNOSIS — M6281 Muscle weakness (generalized): Secondary | ICD-10-CM | POA: Diagnosis not present

## 2020-03-18 DIAGNOSIS — F0280 Dementia in other diseases classified elsewhere without behavioral disturbance: Secondary | ICD-10-CM | POA: Diagnosis not present

## 2020-03-18 DIAGNOSIS — G8929 Other chronic pain: Secondary | ICD-10-CM | POA: Diagnosis not present

## 2020-03-18 DIAGNOSIS — R4189 Other symptoms and signs involving cognitive functions and awareness: Secondary | ICD-10-CM | POA: Diagnosis not present

## 2020-03-18 DIAGNOSIS — I4819 Other persistent atrial fibrillation: Secondary | ICD-10-CM | POA: Diagnosis not present

## 2020-03-18 DIAGNOSIS — K219 Gastro-esophageal reflux disease without esophagitis: Secondary | ICD-10-CM | POA: Diagnosis not present

## 2020-03-18 DIAGNOSIS — M6281 Muscle weakness (generalized): Secondary | ICD-10-CM | POA: Diagnosis not present

## 2020-03-18 DIAGNOSIS — R29898 Other symptoms and signs involving the musculoskeletal system: Secondary | ICD-10-CM | POA: Diagnosis not present

## 2020-03-18 DIAGNOSIS — H3530 Unspecified macular degeneration: Secondary | ICD-10-CM | POA: Diagnosis not present

## 2020-03-18 DIAGNOSIS — R296 Repeated falls: Secondary | ICD-10-CM | POA: Diagnosis not present

## 2020-03-21 ENCOUNTER — Other Ambulatory Visit: Payer: Self-pay

## 2020-03-21 MED ORDER — PREGABALIN 50 MG PO CAPS: 50.0000 mg | ORAL_CAPSULE | Freq: Every day | ORAL | 2 refills | Status: DC

## 2020-03-21 NOTE — Telephone Encounter (Signed)
Script came thru fax sent to [facility] for approval

## 2020-03-25 ENCOUNTER — Non-Acute Institutional Stay (SKILLED_NURSING_FACILITY): Payer: Medicare PPO | Admitting: Nurse Practitioner

## 2020-03-25 ENCOUNTER — Encounter: Payer: Self-pay | Admitting: Nurse Practitioner

## 2020-03-25 DIAGNOSIS — K219 Gastro-esophageal reflux disease without esophagitis: Secondary | ICD-10-CM

## 2020-03-25 DIAGNOSIS — F329 Major depressive disorder, single episode, unspecified: Secondary | ICD-10-CM

## 2020-03-25 DIAGNOSIS — R21 Rash and other nonspecific skin eruption: Secondary | ICD-10-CM

## 2020-03-25 DIAGNOSIS — J42 Unspecified chronic bronchitis: Secondary | ICD-10-CM

## 2020-03-25 DIAGNOSIS — D649 Anemia, unspecified: Secondary | ICD-10-CM | POA: Diagnosis not present

## 2020-03-25 DIAGNOSIS — I4819 Other persistent atrial fibrillation: Secondary | ICD-10-CM | POA: Diagnosis not present

## 2020-03-25 DIAGNOSIS — E222 Syndrome of inappropriate secretion of antidiuretic hormone: Secondary | ICD-10-CM

## 2020-03-25 DIAGNOSIS — I10 Essential (primary) hypertension: Secondary | ICD-10-CM | POA: Diagnosis not present

## 2020-03-25 DIAGNOSIS — F0280 Dementia in other diseases classified elsewhere without behavioral disturbance: Secondary | ICD-10-CM

## 2020-03-25 DIAGNOSIS — K5901 Slow transit constipation: Secondary | ICD-10-CM | POA: Diagnosis not present

## 2020-03-25 DIAGNOSIS — M25551 Pain in right hip: Secondary | ICD-10-CM | POA: Diagnosis not present

## 2020-03-25 DIAGNOSIS — R600 Localized edema: Secondary | ICD-10-CM

## 2020-03-25 DIAGNOSIS — R079 Chest pain, unspecified: Secondary | ICD-10-CM | POA: Diagnosis not present

## 2020-03-25 DIAGNOSIS — F0393 Unspecified dementia, unspecified severity, with mood disturbance: Secondary | ICD-10-CM

## 2020-03-25 NOTE — Assessment & Plan Note (Signed)
Anxious, irritable, adding Sertraline 71m qd, continue Wellbutrin, update CBC/diff, CMP/eGFR

## 2020-03-25 NOTE — Assessment & Plan Note (Signed)
Rash on back, itching, irritable

## 2020-03-25 NOTE — Progress Notes (Signed)
Location:   S[facility] [place] Room Number: 88 Place of Service:  SNF (31) Provider: Lennie Odor Meiling Hendriks NP  [name], MD  Patient Care Team: [name], MD as PCP - General (Internal Medicine) [name], MD as PCP - Cardiology (Cardiology) [name], MD as Consulting Physician (Internal Medicine) Ngetich, Nelda Bucks, NP as Nurse Practitioner (Family Medicine)  Extended Emergency Contact Information Primary Emergency Contact: Spegal,Bob Address: [address]           [place], [place] Phone: [phone] Relation: Son Secondary Emergency Contact: Philmore [place] Phone: [phone] Mobile Phone: [phone] Relation: Daughter  Code Status: DNR Goals of care: Advanced Directive information Advanced Directives 03/26/2020  Does Patient Have a Medical Advance Directive? Yes  Type of Paramedic of [place];Living will;Out of facility DNR (pink MOST or yellow form)  Does patient want to make changes to medical advance directive? No - Patient declined  Copy of [place] in Chart? Yes - validated most recent copy scanned in chart (See row information)  Would patient like information on creating a medical advance directive? -  Pre-existing out of facility DNR order (yellow form or pink MOST form) -     Chief Complaint  Patient presents with  . Acute Visit    Back rash    HPI:  Pt is a 86 y.o. female seen today for an acute visit for rash on the back, under the breasts R>L, itching, onset and duration are uncertain.   The patient was reported anxious, irritable, crying, upset, on Wellbutrin, not adequate controlling her mood.    Hx of dementia, on Memantine 27m bid for memory.  Hx of OA, multiple sites, pain is controlled on Gabapentin 229mqd, 506md. Tylenol 650m31md. Hx of COPD, stable, on Symbicort bid. Her mood is not stable, on Wellbutrin 150mg64m  HTN, blood pressure is controlled on Carvedilol 12.5mg b41m Hyponatremia, stable, on Demeclocycline 150mg b25mGERD, stable, on Famotidine 20mg qd28mntoprazole 40mg qd.63memia, stable, on Fe. Constipation, stable, on MiraLax qod. Edema, minimal edema BLE, on Torsemide 20mg qd. 33mPast Medical History:  Diagnosis Date  . Amputation of finger of right hand   . Aortic stenosis, mild 03/03/2016   By echo 08/2015  . Arthritis   . Back pain   . Bradycardia 08/28/2015  . CKD (chronic kidney disease) stage 3, GFR 30-59 ml/min   . Closed right hip fracture (HCC) 03/20[place] . Depression   . GERD (gastroesophageal reflux disease)   . GI bleed due to NSAIDs   . Hyperlipidemia   . Hypertension   . Macular degeneration   . Maxillary sinusitis   . MVP (mitral valve prolapse) 03/03/2016   Bileaflet MVP with mild MR by echo 2016  . Old MI (myocardial infarction)    NSTEMI secondary to stress MI/Takotsubo CM, minimal nonobstructive ASCAD at cath  . Osteoporosis   . Persistent atrial fibrillation (HCC) 2006 [place] on anticoagulation due to GI bleed and increased fall risk  . PMR (polymyalgia rheumatica) (HCC)   . SIADH (syndrome of inappropriate ADH production) (HCC)   . [place]o cardiomyopathy    EF normalized to 70%  . Ventricular tachycardia (HCC)    [place]   Past Surgical History:  Procedure Laterality Date  . CARDIAC CATHETERIZATION     normal coronary arteries  .  CHOLECYSTECTOMY  11/10/2011   Procedure: LAPAROSCOPIC CHOLECYSTECTOMY WITH INTRAOPERATIVE CHOLANGIOGRAM;  Surgeon: Pedro Earls, MD;  Location: [facility];  Service: General;  Laterality: N/A;  . EYE SURGERY  06/10/11   membrane peel   . INTRAMEDULLARY (IM) NAIL INTERTROCHANTERIC Right 12/21/2018   Procedure: INTRAMEDULLARY (IM) NAIL INTERTROCHANTRIC;  Surgeon: Nicholes Stairs, MD;  Location: [facility];  Service: Orthopedics;  Laterality: Right;  . NASAL SINUS SURGERY Left   . ROTATOR CUFF REPAIR  1998    . UMBILICAL HERNIA REPAIR  11/10/2011   Procedure: HERNIA REPAIR UMBILICAL ADULT;  Surgeon: Pedro Earls, MD;  Location: [facility];  Service: General;  Laterality: N/A;  . WISDOM TOOTH EXTRACTION      Allergies  Allergen Reactions  . Amlodipine Swelling    To feet and ankles   . Avelox [Moxifloxacin Hcl In Nacl] Other (See Comments)    Pt does not remember   . Biaxin [Clarithromycin]   . Ceftin [Cefuroxime Axetil]   . Duragesic Disc Transdermal System [Fentanyl] Other (See Comments)    Reaction unknown  . Forteo [Parathyroid Hormone (Recomb)] Other (See Comments)    Made skin dry  . Morphine And Related Other (See Comments)    "My Sister and daughter can,t take it. I've had it before without problems."  . Teriparatide   . Lidocaine Rash    Allergies as of 03/25/2020      Reactions   Amlodipine Swelling   To feet and ankles    Avelox [moxifloxacin Hcl In Nacl] Other (See Comments)   Pt does not remember   Biaxin [clarithromycin]    Ceftin [cefuroxime Axetil]    Duragesic Disc Transdermal System [fentanyl] Other (See Comments)   Reaction unknown   Forteo [parathyroid Hormone (recomb)] Other (See Comments)   Made skin dry   Morphine And Related Other (See Comments)   "My Sister and daughter can,t take it. I've had it before without problems."   Teriparatide    Lidocaine Rash      Medication List       Accurate as of March 25, 2020 11:59 PM. If you have any questions, ask your nurse or doctor.        acetaminophen 500 MG tablet Commonly known as: TYLENOL Take 500 mg by mouth every 6 (six) hours as needed. Every 6hrs.   acetaminophen 325 MG tablet Commonly known as: TYLENOL Take 650 mg by mouth 2 (two) times daily.   albuterol (2.5 MG/3ML) 0.083% nebulizer solution Commonly known as: PROVENTIL Take 2.5 mg by nebulization every 6 (six) hours as needed for wheezing or shortness of breath.   amLODipine 5 MG tablet Commonly known as: NORVASC TAKE 1 TABLET BY MOUTH  EVERY DAY   azelastine 0.1 % nasal spray Commonly known as: ASTELIN Place 2 sprays into both nostrils 2 (two) times daily as needed for allergies.   budesonide-formoterol 80-4.5 MCG/ACT inhaler Commonly known as: SYMBICORT Inhale 2 puffs into the lungs 2 (two) times daily.   buPROPion 150 MG 12 hr tablet Commonly known as: WELLBUTRIN SR Take 1 tablet (150 mg total) by mouth every morning.   carvedilol 12.5 MG tablet Commonly known as: COREG Take 1 tablet (12.5 mg total) by mouth 2 (two) times daily with a meal.   cetirizine 10 MG tablet Commonly known as: ZYRTEC Take 10 mg by mouth daily.   demeclocycline 150 MG tablet Commonly known as: DECLOMYCIN Take 150 mg by mouth 2 (two) times daily.   diclofenac sodium 1 %  Gel Commonly known as: VOLTAREN Apply 1 application topically 4 (four) times daily as needed (pain).   famotidine 20 MG tablet Commonly known as: PEPCID Take 20 mg by mouth daily.   feeding supplement (PRO-STAT SUGAR FREE 64) Liqd Take 30 mLs by mouth daily.   fluticasone 50 MCG/ACT nasal spray Commonly known as: FLONASE Place into both nostrils daily. 2 Sprays in both nostrils.   Iron 325 (65 Fe) MG Tabs Take 1 tablet by mouth daily. Give with meals   lactose free nutrition Liqd Take 237 mLs by mouth 2 (two) times daily. Between Meals 10am-6pm.   Lubricating Eye Drops 0.5-0.9 % ophthalmic solution Generic drug: carboxymethylcellul-glycerin Place 1 drop into both eyes 3 (three) times daily as needed for dry eyes. For allergies   memantine 5 MG tablet Commonly known as: NAMENDA Take 5 mg by mouth 2 (two) times daily.   ondansetron 4 MG tablet Commonly known as: ZOFRAN Take 1 tablet (4 mg total) by mouth every 6 (six) hours as needed for nausea.   pantoprazole 40 MG tablet Commonly known as: PROTONIX Take 40 mg by mouth daily.   polyethylene glycol 17 g packet Commonly known as: MIRALAX / GLYCOLAX Take 17 g by mouth every other day.     pregabalin 25 MG capsule Commonly known as: LYRICA Take 25 mg by mouth daily.   pregabalin 50 MG capsule Commonly known as: LYRICA Take 1 capsule (50 mg total) by mouth at bedtime.   sertraline 25 MG tablet Commonly known as: ZOLOFT Take 25 mg by mouth daily.   torsemide 20 MG tablet Commonly known as: DEMADEX Take 20 mg by mouth once.   triamcinolone cream 0.5 % Commonly known as: KENALOG Apply 1 application topically 2 (two) times daily.   zinc oxide 20 % ointment Apply 1 application topically as needed for irritation. To buttocks after every incontinent episode and as needed for redness. May keep at bedside       Review of Systems  Constitutional: Negative for appetite change, fatigue and fever.       Gradual weight gain since admitted to [facility], #7Ibs weight gain in the past month.   HENT: Positive for hearing loss. Negative for congestion and voice change.   Eyes: Negative for visual disturbance.  Respiratory: Negative for cough, shortness of breath and wheezing.   Cardiovascular: Positive for leg swelling. Negative for chest pain and palpitations.  Gastrointestinal: Negative for abdominal pain and constipation.       C/o nauseated this morning, but better now, did not vomit, c/o loose stools.   Genitourinary: Negative for difficulty urinating, dysuria and urgency.  Musculoskeletal: Positive for arthralgias, back pain, gait problem and myalgias.       Right hip, lower back.   Skin: Positive for rash. Negative for color change.  Neurological: Negative for dizziness, speech difficulty and weakness.       Memory lapses.   Psychiatric/Behavioral: Positive for agitation, behavioral problems and dysphoric mood. Negative for sleep disturbance. The patient is nervous/anxious.     Immunization History  Administered Date(s) Administered  . DTaP 12/14/2011  . Influenza, High Dose Seasonal PF 07/12/2017, 08/03/2019  . Moderna SARS-COVID-2 Vaccination 10/23/2019, 11/18/2019   . Pneumococcal Conjugate-13 06/01/2014  . Pneumococcal Polysaccharide-23 06/11/1999   Pertinent  Health Maintenance Due  Topic Date Due  . DEXA SCAN  Never done  . INFLUENZA VACCINE  05/19/2020  . PNA vac Low Risk Adult  Completed   No flowsheet data found. Functional Status  Survey:    Vitals:   03/25/20 1531  BP: 126/66  Pulse: 60  Resp: 20  Temp: 97.6 F (36.4 C)  SpO2: 93%  Weight: 132 lb 6.4 oz (60.1 kg)  Height: '4\' 6"'  (1.372 m)   Body mass index is 31.92 kg/m. Physical Exam Vitals and nursing note reviewed.  Constitutional:      Appearance: Normal appearance.  HENT:     Head: Normocephalic and atraumatic.     Mouth/Throat:     Mouth: Mucous membranes are moist.  Eyes:     Extraocular Movements: Extraocular movements intact.     Conjunctiva/sclera: Conjunctivae normal.     Pupils: Pupils are equal, round, and reactive to light.  Cardiovascular:     Rate and Rhythm: Normal rate and regular rhythm.     Heart sounds: Murmur present.     Comments: HR in 60 upon my examination Pulmonary:     Breath sounds: Rales present. No wheezing.     Comments: Bibasilar rales.  Abdominal:     General: Bowel sounds are normal.     Tenderness: There is no abdominal tenderness. There is no guarding or rebound.  Musculoskeletal:     Cervical back: Normal range of motion and neck supple.     Right lower leg: Edema present.     Left lower leg: Edema present.     Comments: Trace edema BLE, mostly in feet/ankle. Chronic pain in the lower back, right hip.  Skin:    General: Skin is warm and dry.     Findings: Rash present.     Comments: Back, macular, itching, redness under the R+L breasts R.>L, peeling  Neurological:     General: No focal deficit present.     Mental Status: She is alert. Mental status is at baseline.     Motor: No weakness.     Gait: Gait abnormal.     Comments: Oriented to person, place.   Psychiatric:     Comments: Sad anxious facial looks.      Labs  reviewed: Recent Labs    12/07/19 0000 12/21/19 0000 12/26/19 0000  NA 142 142 144  K 4.5 4.3 4.0  CL 103 103 106  CO2 31* 32* 29*  BUN 47* 48* 50*  CREATININE 1.6* 1.3* 1.3*  CALCIUM 9.2 9.5 9.0   Recent Labs    11/14/19 0000 11/23/19 0000 12/26/19 0000  AST '19 15 16  ' ALT '15 13 15  ' ALKPHOS 87 82 108  ALBUMIN 3.4* 3.0* 3.0*   Recent Labs    03/30/19 0000 11/14/19 0000 12/26/19 0000  WBC 7.9 6.5 6.8  NEUTROABS  --  3,634 3,903  HGB 9.8* 12.3 11.5*  HCT 30* 38 35*  PLT 532* 266 287   Lab Results  Component Value Date   TSH 2.600 09/19/2018   No results found for: HGBA1C No results found for: CHOL, HDL, LDLCALC, LDLDIRECT, TRIG, CHOLHDL  Significant Diagnostic Results in last 30 days:  No results found.  Assessment/Plan: Rash Rash on back, itching, irritable  Depression due to dementia (HCC) Anxious, irritable, adding Sertraline 52m qd, continue Wellbutrin, update CBC/diff, CMP/eGFR  Essential hypertension, benign Blood pressure is controlled, continue Carvedilol  Persistent atrial fibrillation (HCC) Heart rate is in control  Chronic bronchitis (HCC) Stable, continue Symbicort.   Slow transit constipation Stable, continue MiraLax qod.   SIADH (syndrome of inappropriate ADH production) (HCC) Stable, continue Demeclocycline.   Right hip pain Better controlled, continue Gabapentin, Tylenol.   Anemia Stable,  continue Fe  Esophageal reflux Stable, continue Famotidine, Pantoprazole.   Edema of extremities Trace edema BLE, continue Torsemide.     Family/ staff Communication: plan of care reviewed with the patient, the patient 's daughter, and charge nurse.   Labs/tests ordered:  CBC/diff, CMP/eGFR  Time spend 25 minutes.

## 2020-03-26 ENCOUNTER — Non-Acute Institutional Stay (SKILLED_NURSING_FACILITY): Payer: Medicare PPO | Admitting: Internal Medicine

## 2020-03-26 ENCOUNTER — Encounter: Payer: Self-pay | Admitting: Nurse Practitioner

## 2020-03-26 ENCOUNTER — Encounter: Payer: Self-pay | Admitting: Internal Medicine

## 2020-03-26 DIAGNOSIS — N189 Chronic kidney disease, unspecified: Secondary | ICD-10-CM | POA: Diagnosis not present

## 2020-03-26 DIAGNOSIS — N179 Acute kidney failure, unspecified: Secondary | ICD-10-CM | POA: Diagnosis not present

## 2020-03-26 DIAGNOSIS — I959 Hypotension, unspecified: Secondary | ICD-10-CM | POA: Diagnosis not present

## 2020-03-26 DIAGNOSIS — R5383 Other fatigue: Secondary | ICD-10-CM

## 2020-03-26 DIAGNOSIS — J189 Pneumonia, unspecified organism: Secondary | ICD-10-CM

## 2020-03-26 DIAGNOSIS — R001 Bradycardia, unspecified: Secondary | ICD-10-CM | POA: Diagnosis not present

## 2020-03-26 DIAGNOSIS — I10 Essential (primary) hypertension: Secondary | ICD-10-CM | POA: Diagnosis not present

## 2020-03-26 NOTE — Assessment & Plan Note (Signed)
Heart rate is in control.  

## 2020-03-26 NOTE — Assessment & Plan Note (Signed)
Stable, continue MiraLax qod.

## 2020-03-26 NOTE — Assessment & Plan Note (Signed)
Stable, continue Demeclocycline.

## 2020-03-26 NOTE — Assessment & Plan Note (Signed)
Stable, continue Famotidine, Pantoprazole.  

## 2020-03-26 NOTE — Assessment & Plan Note (Signed)
Blood pressure is controlled, continue Carvedilol

## 2020-03-26 NOTE — Assessment & Plan Note (Signed)
Stable, continue Symbicort.

## 2020-03-26 NOTE — Assessment & Plan Note (Signed)
Stable, continue Fe 

## 2020-03-26 NOTE — Progress Notes (Signed)
Location:   [facility] Room Number: 34-A Place of Service:  SNF [id number]) Provider:  Veleta Miners, MD  [name], MD  Patient Care Team: [name], MD as PCP - General (Internal Medicine) [name], MD as PCP - Cardiology (Cardiology) [name], MD as Consulting Physician (Internal Medicine) Ngetich, Nelda Bucks, NP as Nurse Practitioner (Family Medicine)  Extended Emergency Contact Information Primary Emergency Contact: Slawson,Bob Address: [address]           [place], [place] Phone: [phone] Relation: Son Secondary Emergency Contact: Philmore [place] Phone: [phone] Mobile Phone: [phone] Relation: Daughter  Code Status:  DNR Goals of care: Advanced Directive information Advanced Directives 03/26/2020  Does Patient Have a Medical Advance Directive? Yes  Type of Paramedic of [place];Living will;Out of facility DNR (pink MOST or yellow form)  Does patient want to make changes to medical advance directive? No - Patient declined  Copy of [place] in Chart? Yes - validated most recent copy scanned in chart (See row information)  Would patient like information on creating a medical advance directive? -  Pre-existing out of facility DNR order (yellow form or pink MOST form) -     Chief Complaint  Patient presents with  . Acute Visit    Possible Pneumonia    HPI:  Pt is a 86 y.o. female seen today for an acute visit for Pneumonia  She has a history of Takotsubo's cardiomyopathy with EF of 55 to 27%, diastolic dysfunction, hypertension, PAF not on any anticoagulation, SIADH, depressionand S/P Right Hip Fractureunderwent InterMedullary Implant on 03/04. And recentdisplaced fracture of distal right ulnarDue to Fall  Patient was seem to be confused yesterday. Had Chest Xray done which showed Bilateral Infiltrate  possible pneumonia. Today patient is more confused and lethargic.  Her repeat labs are pending at this time but per nurses she has not been eating well.  Does not have any fever.  Did have low heart rate and low blood pressure. I was unable to get detailed history from her she seemed more confused   Past Medical History:  Diagnosis Date  . Amputation of finger of right hand   . Aortic stenosis, mild 03/03/2016   By echo 08/2015  . Arthritis   . Back pain   . Bradycardia 08/28/2015  . CKD (chronic kidney disease) stage 3, GFR 30-59 ml/min   . Closed right hip fracture ([facility]) 12/2018  . Depression   . GERD (gastroesophageal reflux disease)   . GI bleed due to NSAIDs   . Hyperlipidemia   . Hypertension   . Macular degeneration   . Maxillary sinusitis   . MVP (mitral valve prolapse) 03/03/2016   Bileaflet MVP with mild MR by echo 2016  . Old MI (myocardial infarction)    NSTEMI secondary to stress MI/Takotsubo CM, minimal nonobstructive ASCAD at cath  . Osteoporosis   . Persistent atrial fibrillation ([facility]) 2006   not on anticoagulation due to GI bleed and increased fall risk  . PMR (polymyalgia rheumatica) (HCC)   . SIADH (syndrome of inappropriate ADH production) ([facility])   . Takotsubo cardiomyopathy    EF normalized to 70%  . Ventricular tachycardia ([facility])    on initial presentation of MI   Past Surgical History:  Procedure Laterality Date  . CARDIAC CATHETERIZATION     normal coronary arteries  . CHOLECYSTECTOMY  11/10/2011  Procedure: LAPAROSCOPIC CHOLECYSTECTOMY WITH INTRAOPERATIVE CHOLANGIOGRAM;  Surgeon: Pedro Earls, MD;  Location: [facility];  Service: General;  Laterality: N/A;  . EYE SURGERY  06/10/11   membrane peel   . INTRAMEDULLARY (IM) NAIL INTERTROCHANTERIC Right 12/21/2018   Procedure: INTRAMEDULLARY (IM) NAIL INTERTROCHANTRIC;  Surgeon: Nicholes Stairs, MD;  Location: [facility];  Service: Orthopedics;  Laterality: Right;  . NASAL SINUS SURGERY Left   . ROTATOR  CUFF REPAIR  1998  . UMBILICAL HERNIA REPAIR  11/10/2011   Procedure: HERNIA REPAIR UMBILICAL ADULT;  Surgeon: Pedro Earls, MD;  Location: [facility];  Service: General;  Laterality: N/A;  . WISDOM TOOTH EXTRACTION      Allergies  Allergen Reactions  . Amlodipine Swelling    To feet and ankles   . Avelox [Moxifloxacin Hcl In Nacl] Other (See Comments)    Pt does not remember   . Biaxin [Clarithromycin]   . Ceftin [Cefuroxime Axetil]   . Duragesic Disc Transdermal System [Fentanyl] Other (See Comments)    Reaction unknown  . Forteo [Parathyroid Hormone (Recomb)] Other (See Comments)    Made skin dry  . Morphine And Related Other (See Comments)    "My Sister and daughter can,t take it. I've had it before without problems."  . Teriparatide   . Lidocaine Rash    Allergies as of 03/26/2020      Reactions   Amlodipine Swelling   To feet and ankles    Avelox [moxifloxacin Hcl In Nacl] Other (See Comments)   Pt does not remember   Biaxin [clarithromycin]    Ceftin [cefuroxime Axetil]    Duragesic Disc Transdermal System [fentanyl] Other (See Comments)   Reaction unknown   Forteo [parathyroid Hormone (recomb)] Other (See Comments)   Made skin dry   Morphine And Related Other (See Comments)   "My Sister and daughter can,t take it. I've had it before without problems."   Teriparatide    Lidocaine Rash      Medication List       Accurate as of March 26, 2020  2:15 PM. If you have any questions, ask your nurse or doctor.        STOP taking these medications   lactose free nutrition Liqd Stopped by: [name], MD     TAKE these medications   acetaminophen 500 MG tablet Commonly known as: TYLENOL Take 500 mg by mouth every 6 (six) hours as needed. Every 6hrs.   acetaminophen 325 MG tablet Commonly known as: TYLENOL Take 650 mg by mouth 2 (two) times daily.   albuterol (2.5 MG/3ML) 0.083% nebulizer solution Commonly known as: PROVENTIL Take 2.5 mg by nebulization  every 6 (six) hours as needed for wheezing or shortness of breath.   amLODipine 5 MG tablet Commonly known as: NORVASC TAKE 1 TABLET BY MOUTH EVERY DAY   azelastine 0.1 % nasal spray Commonly known as: ASTELIN Place 2 sprays into both nostrils 2 (two) times daily as needed for allergies.   budesonide-formoterol 80-4.5 MCG/ACT inhaler Commonly known as: SYMBICORT Inhale 2 puffs into the lungs 2 (two) times daily.   buPROPion 150 MG 12 hr tablet Commonly known as: WELLBUTRIN SR Take 1 tablet (150 mg total) by mouth every morning.   carvedilol 12.5 MG tablet Commonly known as: COREG Take 1 tablet (12.5 mg total) by mouth 2 (two) times daily with a meal.   cetirizine 10 MG tablet Commonly known as: ZYRTEC Take 10 mg by mouth daily.   demeclocycline 150  MG tablet Commonly known as: DECLOMYCIN Take 150 mg by mouth 2 (two) times daily.   diclofenac sodium 1 % Gel Commonly known as: VOLTAREN Apply 1 application topically 4 (four) times daily as needed (pain).   famotidine 20 MG tablet Commonly known as: PEPCID Take 20 mg by mouth daily.   feeding supplement (PRO-STAT SUGAR FREE 64) Liqd Take 30 mLs by mouth daily.   fluticasone 50 MCG/ACT nasal spray Commonly known as: FLONASE Place into both nostrils daily. 2 Sprays in both nostrils.   Iron 325 (65 Fe) MG Tabs Take 1 tablet by mouth daily. Give with meals   Lubricating Eye Drops 0.5-0.9 % ophthalmic solution Generic drug: carboxymethylcellul-glycerin Place 1 drop into both eyes 3 (three) times daily as needed for dry eyes. For allergies   memantine 5 MG tablet Commonly known as: NAMENDA Take 5 mg by mouth 2 (two) times daily.   NYSTATIN EX Apply 100,000 Units topically 2 (two) times daily. Apply to left and right breast for rash.   ondansetron 4 MG tablet Commonly known as: ZOFRAN Take 1 tablet (4 mg total) by mouth every 6 (six) hours as needed for nausea.   pantoprazole 40 MG tablet Commonly known as:  PROTONIX Take 40 mg by mouth daily.   polyethylene glycol 17 g packet Commonly known as: MIRALAX / GLYCOLAX Take 17 g by mouth every other day.   pregabalin 25 MG capsule Commonly known as: LYRICA Take 25 mg by mouth daily.   pregabalin 50 MG capsule Commonly known as: LYRICA Take 1 capsule (50 mg total) by mouth at bedtime.   sertraline 25 MG tablet Commonly known as: ZOLOFT Take 25 mg by mouth daily.   torsemide 20 MG tablet Commonly known as: DEMADEX Take 20 mg by mouth once.   triamcinolone cream 0.5 % Commonly known as: KENALOG Apply 1 application topically 2 (two) times daily.   zinc oxide 20 % ointment Apply 1 application topically as needed for irritation. To buttocks after every incontinent episode and as needed for redness. May keep at bedside       Review of Systems  Constitutional: Positive for activity change and appetite change.  HENT: Negative.   Respiratory: Positive for cough, shortness of breath and wheezing.   Cardiovascular: Negative.   Gastrointestinal: Negative.   Musculoskeletal: Negative.   Skin: Negative.   Neurological: Positive for weakness.  Psychiatric/Behavioral: Positive for confusion and dysphoric mood.    Immunization History  Administered Date(s) Administered  . DTaP 12/14/2011  . Influenza, High Dose Seasonal PF 07/12/2017, 08/03/2019  . Moderna SARS-COVID-2 Vaccination 10/23/2019, 11/18/2019  . Pneumococcal Conjugate-13 06/01/2014  . Pneumococcal Polysaccharide-23 06/11/1999   Pertinent  Health Maintenance Due  Topic Date Due  . DEXA SCAN  Never done  . INFLUENZA VACCINE  05/19/2020  . PNA vac Low Risk Adult  Completed   No flowsheet data found. Functional Status Survey:    Vitals:   03/26/20 1403  BP: (!) 110/54  Pulse: (!) 54  Resp: 20  Temp: 97.6 F (36.4 C)  SpO2: 93%  Weight: 132 lb 6.4 oz (60.1 kg)  Height: 4\' 6"  (1.372 m)   Body mass index is 31.92 kg/m. Physical Exam Vitals reviewed.    Constitutional:      Comments: Looks Confused   HENT:     Head: Normocephalic.     Nose: Nose normal.     Mouth/Throat:     Mouth: Mucous membranes are dry.     Pharynx: Oropharynx is  clear.  Eyes:     Pupils: Pupils are equal, round, and reactive to light.  Cardiovascular:     Rate and Rhythm: Bradycardia present.     Pulses: Normal pulses.     Heart sounds: Normal heart sounds.  Pulmonary:     Effort: Pulmonary effort is normal.     Breath sounds: Wheezing present.  Abdominal:     General: Abdomen is flat. Bowel sounds are normal.     Palpations: Abdomen is soft.  Musculoskeletal:        General: No swelling.     Cervical back: Neck supple.  Skin:    General: Skin is warm.  Neurological:     General: No focal deficit present.     Mental Status: She is alert.     Comments: More Lethargic. Responds Slowly which is not her baseline. COnfused  Psychiatric:        Mood and Affect: Mood normal.        Thought Content: Thought content normal.     Labs reviewed: Recent Labs    12/07/19 0000 12/21/19 0000 12/26/19 0000  NA 142 142 144  K 4.5 4.3 4.0  CL 103 103 106  CO2 31* 32* 29*  BUN 47* 48* 50*  CREATININE 1.6* 1.3* 1.3*  CALCIUM 9.2 9.5 9.0   Recent Labs    11/14/19 0000 11/23/19 0000 12/26/19 0000  AST 19 15 16   ALT 15 13 15   ALKPHOS 87 82 108  ALBUMIN 3.4* 3.0* 3.0*   Recent Labs    03/30/19 0000 11/14/19 0000 12/26/19 0000  WBC 7.9 6.5 6.8  NEUTROABS  --  3,634 3,903  HGB 9.8* 12.3 11.5*  HCT 30* 38 35*  PLT 532* 266 287   Lab Results  Component Value Date   TSH 2.600 09/19/2018   No results found for: HGBA1C No results found for: CHOL, HDL, LDLCALC, LDLDIRECT, TRIG, CHOLHDL  Significant Diagnostic Results in last 30 days:  No results found.  Assessment/Plan Pneumonia of both lower lobes due to infectious organism Will start her on Augmentin 500 mg Q8 hours for 7 days White Count is 9.8 Vitals Q 4 hours Albuterol Nebs Q6 for 2  days Oxygen PRN  Lethargy Discontinue Zoloft and Reduce the dose of Lyrica to 25 mg BID  Acute kidney injury superimposed on CKD (HCC) Creat 2 BUN 90 Start on NS 75 /hour for 1 lit Discontinue Demadex Repeat BMP Hypotension, unspecified hypotension type Discontinue Norvasc and Coreg Bradycardia EKG showed Sinus Bradycardia Discontinue Coreg   SIADH (syndrome of inappropriate ADH production) (HCC) Sodium stable on demeclocycline Anemia, Hemoglobin is stable on iron  Family/ staff Communication:   Labs/tests ordered:

## 2020-03-26 NOTE — Assessment & Plan Note (Signed)
Trace edema BLE, continue Torsemide.

## 2020-03-26 NOTE — Assessment & Plan Note (Signed)
Better controlled, continue Gabapentin, Tylenol.

## 2020-03-27 ENCOUNTER — Other Ambulatory Visit: Payer: Self-pay

## 2020-03-27 ENCOUNTER — Encounter: Payer: Self-pay | Admitting: Nurse Practitioner

## 2020-03-27 ENCOUNTER — Inpatient Hospital Stay (HOSPITAL_COMMUNITY)
Admission: EM | Admit: 2020-03-27 | Discharge: 2020-04-03 | DRG: 193 | Disposition: A | Discharge status: Skilled Nursing Facility | Payer: Medicare PPO | Source: Skilled Nursing Facility | Attending: Internal Medicine | Admitting: Internal Medicine

## 2020-03-27 ENCOUNTER — Emergency Department (HOSPITAL_COMMUNITY): Payer: Medicare PPO

## 2020-03-27 ENCOUNTER — Inpatient Hospital Stay (HOSPITAL_COMMUNITY): Payer: Medicare PPO

## 2020-03-27 ENCOUNTER — Other Ambulatory Visit: Payer: Self-pay | Admitting: *Deleted

## 2020-03-27 ENCOUNTER — Encounter (HOSPITAL_COMMUNITY): Payer: Self-pay | Admitting: Emergency Medicine

## 2020-03-27 ENCOUNTER — Non-Acute Institutional Stay (SKILLED_NURSING_FACILITY): Payer: Medicare PPO | Admitting: Nurse Practitioner

## 2020-03-27 DIAGNOSIS — I5030 Unspecified diastolic (congestive) heart failure: Secondary | ICD-10-CM | POA: Diagnosis not present

## 2020-03-27 DIAGNOSIS — E222 Syndrome of inappropriate secretion of antidiuretic hormone: Secondary | ICD-10-CM | POA: Diagnosis present

## 2020-03-27 DIAGNOSIS — M2550 Pain in unspecified joint: Secondary | ICD-10-CM | POA: Diagnosis not present

## 2020-03-27 DIAGNOSIS — M810 Age-related osteoporosis without current pathological fracture: Secondary | ICD-10-CM | POA: Diagnosis present

## 2020-03-27 DIAGNOSIS — R600 Localized edema: Secondary | ICD-10-CM | POA: Diagnosis present

## 2020-03-27 DIAGNOSIS — E162 Hypoglycemia, unspecified: Secondary | ICD-10-CM | POA: Diagnosis not present

## 2020-03-27 DIAGNOSIS — N189 Chronic kidney disease, unspecified: Secondary | ICD-10-CM | POA: Diagnosis not present

## 2020-03-27 DIAGNOSIS — E870 Hyperosmolality and hypernatremia: Secondary | ICD-10-CM

## 2020-03-27 DIAGNOSIS — I130 Hypertensive heart and chronic kidney disease with heart failure and stage 1 through stage 4 chronic kidney disease, or unspecified chronic kidney disease: Secondary | ICD-10-CM | POA: Diagnosis present

## 2020-03-27 DIAGNOSIS — N3090 Cystitis, unspecified without hematuria: Secondary | ICD-10-CM

## 2020-03-27 DIAGNOSIS — Z79899 Other long term (current) drug therapy: Secondary | ICD-10-CM

## 2020-03-27 DIAGNOSIS — H3530 Unspecified macular degeneration: Secondary | ICD-10-CM | POA: Diagnosis present

## 2020-03-27 DIAGNOSIS — Z20822 Contact with and (suspected) exposure to covid-19: Secondary | ICD-10-CM | POA: Diagnosis present

## 2020-03-27 DIAGNOSIS — J9601 Acute respiratory failure with hypoxia: Secondary | ICD-10-CM | POA: Diagnosis present

## 2020-03-27 DIAGNOSIS — I5189 Other ill-defined heart diseases: Secondary | ICD-10-CM

## 2020-03-27 DIAGNOSIS — F0280 Dementia in other diseases classified elsewhere without behavioral disturbance: Secondary | ICD-10-CM | POA: Diagnosis not present

## 2020-03-27 DIAGNOSIS — K219 Gastro-esophageal reflux disease without esophagitis: Secondary | ICD-10-CM

## 2020-03-27 DIAGNOSIS — J440 Chronic obstructive pulmonary disease with acute lower respiratory infection: Secondary | ICD-10-CM | POA: Diagnosis present

## 2020-03-27 DIAGNOSIS — N179 Acute kidney failure, unspecified: Secondary | ICD-10-CM | POA: Diagnosis not present

## 2020-03-27 DIAGNOSIS — Z888 Allergy status to other drugs, medicaments and biological substances status: Secondary | ICD-10-CM

## 2020-03-27 DIAGNOSIS — L8990 Pressure ulcer of unspecified site, unspecified stage: Secondary | ICD-10-CM | POA: Insufficient documentation

## 2020-03-27 DIAGNOSIS — R0902 Hypoxemia: Secondary | ICD-10-CM | POA: Diagnosis present

## 2020-03-27 DIAGNOSIS — R296 Repeated falls: Secondary | ICD-10-CM | POA: Diagnosis not present

## 2020-03-27 DIAGNOSIS — E161 Other hypoglycemia: Secondary | ICD-10-CM | POA: Diagnosis not present

## 2020-03-27 DIAGNOSIS — Z66 Do not resuscitate: Secondary | ICD-10-CM | POA: Diagnosis not present

## 2020-03-27 DIAGNOSIS — Z9181 History of falling: Secondary | ICD-10-CM

## 2020-03-27 DIAGNOSIS — Z885 Allergy status to narcotic agent status: Secondary | ICD-10-CM

## 2020-03-27 DIAGNOSIS — E440 Moderate protein-calorie malnutrition: Secondary | ICD-10-CM | POA: Diagnosis not present

## 2020-03-27 DIAGNOSIS — Z7189 Other specified counseling: Secondary | ICD-10-CM

## 2020-03-27 DIAGNOSIS — R4189 Other symptoms and signs involving cognitive functions and awareness: Secondary | ICD-10-CM

## 2020-03-27 DIAGNOSIS — G9340 Encephalopathy, unspecified: Secondary | ICD-10-CM | POA: Diagnosis present

## 2020-03-27 DIAGNOSIS — Z743 Need for continuous supervision: Secondary | ICD-10-CM | POA: Diagnosis not present

## 2020-03-27 DIAGNOSIS — E785 Hyperlipidemia, unspecified: Secondary | ICD-10-CM | POA: Diagnosis present

## 2020-03-27 DIAGNOSIS — E876 Hypokalemia: Secondary | ICD-10-CM | POA: Diagnosis not present

## 2020-03-27 DIAGNOSIS — R400 Somnolence: Secondary | ICD-10-CM

## 2020-03-27 DIAGNOSIS — G9341 Metabolic encephalopathy: Secondary | ICD-10-CM | POA: Diagnosis present

## 2020-03-27 DIAGNOSIS — I5033 Acute on chronic diastolic (congestive) heart failure: Secondary | ICD-10-CM | POA: Diagnosis present

## 2020-03-27 DIAGNOSIS — F0393 Unspecified dementia, unspecified severity, with mood disturbance: Secondary | ICD-10-CM

## 2020-03-27 DIAGNOSIS — Z884 Allergy status to anesthetic agent status: Secondary | ICD-10-CM

## 2020-03-27 DIAGNOSIS — I499 Cardiac arrhythmia, unspecified: Secondary | ICD-10-CM | POA: Diagnosis not present

## 2020-03-27 DIAGNOSIS — D649 Anemia, unspecified: Secondary | ICD-10-CM

## 2020-03-27 DIAGNOSIS — R410 Disorientation, unspecified: Secondary | ICD-10-CM | POA: Diagnosis not present

## 2020-03-27 DIAGNOSIS — S72141A Displaced intertrochanteric fracture of right femur, initial encounter for closed fracture: Secondary | ICD-10-CM | POA: Diagnosis not present

## 2020-03-27 DIAGNOSIS — Z681 Body mass index (BMI) 19 or less, adult: Secondary | ICD-10-CM

## 2020-03-27 DIAGNOSIS — I252 Old myocardial infarction: Secondary | ICD-10-CM

## 2020-03-27 DIAGNOSIS — J189 Pneumonia, unspecified organism: Principal | ICD-10-CM

## 2020-03-27 DIAGNOSIS — J90 Pleural effusion, not elsewhere classified: Secondary | ICD-10-CM | POA: Diagnosis not present

## 2020-03-27 DIAGNOSIS — F329 Major depressive disorder, single episode, unspecified: Secondary | ICD-10-CM

## 2020-03-27 DIAGNOSIS — F0390 Unspecified dementia without behavioral disturbance: Secondary | ICD-10-CM | POA: Diagnosis present

## 2020-03-27 DIAGNOSIS — Y95 Nosocomial condition: Secondary | ICD-10-CM | POA: Diagnosis present

## 2020-03-27 DIAGNOSIS — J42 Unspecified chronic bronchitis: Secondary | ICD-10-CM

## 2020-03-27 DIAGNOSIS — R4182 Altered mental status, unspecified: Secondary | ICD-10-CM | POA: Insufficient documentation

## 2020-03-27 DIAGNOSIS — Z8679 Personal history of other diseases of the circulatory system: Secondary | ICD-10-CM

## 2020-03-27 DIAGNOSIS — R0602 Shortness of breath: Secondary | ICD-10-CM | POA: Diagnosis not present

## 2020-03-27 DIAGNOSIS — I4819 Other persistent atrial fibrillation: Secondary | ICD-10-CM | POA: Diagnosis not present

## 2020-03-27 DIAGNOSIS — Z515 Encounter for palliative care: Secondary | ICD-10-CM | POA: Diagnosis not present

## 2020-03-27 DIAGNOSIS — N184 Chronic kidney disease, stage 4 (severe): Secondary | ICD-10-CM | POA: Diagnosis not present

## 2020-03-27 DIAGNOSIS — E860 Dehydration: Secondary | ICD-10-CM | POA: Diagnosis present

## 2020-03-27 DIAGNOSIS — I5031 Acute diastolic (congestive) heart failure: Secondary | ICD-10-CM | POA: Diagnosis not present

## 2020-03-27 DIAGNOSIS — M21371 Foot drop, right foot: Secondary | ICD-10-CM | POA: Diagnosis present

## 2020-03-27 DIAGNOSIS — L89151 Pressure ulcer of sacral region, stage 1: Secondary | ICD-10-CM | POA: Diagnosis present

## 2020-03-27 DIAGNOSIS — H9190 Unspecified hearing loss, unspecified ear: Secondary | ICD-10-CM | POA: Diagnosis present

## 2020-03-27 DIAGNOSIS — Z87440 Personal history of urinary (tract) infections: Secondary | ICD-10-CM

## 2020-03-27 DIAGNOSIS — Z7401 Bed confinement status: Secondary | ICD-10-CM | POA: Diagnosis not present

## 2020-03-27 DIAGNOSIS — T502X5A Adverse effect of carbonic-anhydrase inhibitors, benzothiadiazides and other diuretics, initial encounter: Secondary | ICD-10-CM | POA: Diagnosis not present

## 2020-03-27 DIAGNOSIS — R1312 Dysphagia, oropharyngeal phase: Secondary | ICD-10-CM | POA: Diagnosis present

## 2020-03-27 DIAGNOSIS — I10 Essential (primary) hypertension: Secondary | ICD-10-CM

## 2020-03-27 DIAGNOSIS — E43 Unspecified severe protein-calorie malnutrition: Secondary | ICD-10-CM | POA: Diagnosis not present

## 2020-03-27 DIAGNOSIS — N3001 Acute cystitis with hematuria: Secondary | ICD-10-CM

## 2020-03-27 DIAGNOSIS — M353 Polymyalgia rheumatica: Secondary | ICD-10-CM | POA: Diagnosis present

## 2020-03-27 DIAGNOSIS — I358 Other nonrheumatic aortic valve disorders: Secondary | ICD-10-CM | POA: Diagnosis present

## 2020-03-27 DIAGNOSIS — M21372 Foot drop, left foot: Secondary | ICD-10-CM | POA: Diagnosis present

## 2020-03-27 DIAGNOSIS — R404 Transient alteration of awareness: Secondary | ICD-10-CM | POA: Diagnosis not present

## 2020-03-27 DIAGNOSIS — Z881 Allergy status to other antibiotic agents status: Secondary | ICD-10-CM

## 2020-03-27 LAB — URINALYSIS, ROUTINE W REFLEX MICROSCOPIC
Bilirubin Urine: NEGATIVE
Glucose, UA: NEGATIVE mg/dL
Ketones, ur: NEGATIVE mg/dL
Nitrite: NEGATIVE
Protein, ur: NEGATIVE mg/dL
Specific Gravity, Urine: 1.014 (ref 1.005–1.030)
WBC, UA: >50 WBC/hpf — ABNORMAL HIGH (ref 0–5)
pH: 5 (ref 5.0–8.0)

## 2020-03-27 LAB — BASIC METABOLIC PANEL
Anion gap: 12 (ref 5–15)
BUN: 86 mg/dL — ABNORMAL HIGH (ref 8–23)
BUN: 92 — AB (ref 4–21)
CO2: 27 mmol/L (ref 22–32)
CO2: 31 — AB (ref 13–22)
Calcium: 8.7 mg/dL — ABNORMAL LOW (ref 8.9–10.3)
Chloride: 103 mmol/L (ref 98–111)
Chloride: 96 — AB (ref 99–108)
Creatinine, Ser: 2.29 mg/dL — ABNORMAL HIGH (ref 0.44–1.00)
Creatinine: 3 — AB (ref 0.5–1.1)
GFR calc Af Amer: 20 mL/min — ABNORMAL LOW (ref >60)
GFR calc non Af Amer: 18 mL/min — ABNORMAL LOW (ref >60)
Glucose, Bld: 56 mg/dL — ABNORMAL LOW (ref 70–99)
Glucose: 76
Potassium: 4.2 mmol/L (ref 3.5–5.1)
Potassium: 4.7 (ref 3.4–5.3)
Sodium: 138 (ref 137–147)
Sodium: 142 mmol/L (ref 135–145)

## 2020-03-27 LAB — CBC WITH DIFFERENTIAL/PLATELET
Abs Immature Granulocytes: 0.11 10*3/uL — ABNORMAL HIGH (ref 0.00–0.07)
Basophils Absolute: 0 10*3/uL (ref 0.0–0.1)
Basophils Relative: 0 %
Eosinophils Absolute: 0 10*3/uL (ref 0.0–0.5)
Eosinophils Relative: 0 %
HCT: 36.8 % (ref 36.0–46.0)
Hemoglobin: 11.9 g/dL — ABNORMAL LOW (ref 12.0–15.0)
Immature Granulocytes: 1 %
Lymphocytes Relative: 4 %
Lymphs Abs: 0.6 10*3/uL — ABNORMAL LOW (ref 0.7–4.0)
MCH: 31.2 pg (ref 26.0–34.0)
MCHC: 32.3 g/dL (ref 30.0–36.0)
MCV: 96.3 fL (ref 80.0–100.0)
Monocytes Absolute: 0.7 10*3/uL (ref 0.1–1.0)
Monocytes Relative: 5 %
Neutro Abs: 13.9 10*3/uL — ABNORMAL HIGH (ref 1.7–7.7)
Neutrophils Relative %: 90 %
Platelets: 188 10*3/uL (ref 150–400)
RBC: 3.82 MIL/uL — ABNORMAL LOW (ref 3.87–5.11)
RDW: 14.6 % (ref 11.5–15.5)
WBC: 15.3 10*3/uL — ABNORMAL HIGH (ref 4.0–10.5)
nRBC: 0 % (ref 0.0–0.2)

## 2020-03-27 LAB — LACTIC ACID, PLASMA: Lactic Acid, Venous: 0.9 mmol/L (ref 0.5–1.9)

## 2020-03-27 LAB — CBC AND DIFFERENTIAL
HCT: 36 (ref 36–46)
Hemoglobin: 12.1 (ref 12.0–16.0)
Neutrophils Absolute: 7682
Platelets: 203 (ref 150–399)
WBC: 9.2

## 2020-03-27 LAB — HEPATIC FUNCTION PANEL
ALT: 34 (ref 7–35)
AST: 32 (ref 13–35)
Alkaline Phosphatase: 201 — AB (ref 25–125)
Bilirubin, Total: 0.5

## 2020-03-27 LAB — CBC: RBC: 3.77 — AB (ref 3.87–5.11)

## 2020-03-27 LAB — CBG MONITORING, ED
Glucose-Capillary: 35 mg/dL — CL (ref 70–99)
Glucose-Capillary: 115 mg/dL — ABNORMAL HIGH (ref 70–99)

## 2020-03-27 LAB — BRAIN NATRIURETIC PEPTIDE: B Natriuretic Peptide: 244.9 pg/mL — ABNORMAL HIGH (ref 0.0–100.0)

## 2020-03-27 LAB — TROPONIN I (HIGH SENSITIVITY)
Troponin I (High Sensitivity): 10 ng/L (ref <18)
Troponin I (High Sensitivity): 12 ng/L (ref <18)

## 2020-03-27 LAB — COMPREHENSIVE METABOLIC PANEL
Albumin: 2.9 — AB (ref 3.5–5.0)
Calcium: 8.9 (ref 8.7–10.7)
Globulin: 2.7

## 2020-03-27 LAB — SARS CORONAVIRUS 2 BY RT PCR (HOSPITAL ORDER, PERFORMED IN ~~LOC~~ HOSPITAL LAB): SARS Coronavirus 2: NEGATIVE

## 2020-03-27 MED ORDER — SODIUM CHLORIDE 0.9 % IV SOLN
100.0000 mg | Freq: Two times a day (BID) | INTRAVENOUS | Status: DC
  Administered 2020-03-27: 100 mg via INTRAVENOUS
  Filled 2020-03-27 (×2): qty 100

## 2020-03-27 MED ORDER — DEXTROSE 50 % IV SOLN
1.0000 | Freq: Once | INTRAVENOUS | Status: AC
  Administered 2020-03-27: 50 mL via INTRAVENOUS
  Filled 2020-03-27: qty 50

## 2020-03-27 MED ORDER — HEPARIN SODIUM (PORCINE) 5000 UNIT/ML IJ SOLN
5000.0000 [IU] | Freq: Three times a day (TID) | INTRAMUSCULAR | Status: DC
  Administered 2020-03-27 – 2020-04-03 (×20): 5000 [IU] via SUBCUTANEOUS
  Filled 2020-03-27 (×20): qty 1

## 2020-03-27 MED ORDER — SODIUM CHLORIDE 0.9 % IV SOLN
1000.0000 mL | INTRAVENOUS | Status: DC
  Administered 2020-03-27 (×2): 1000 mL via INTRAVENOUS

## 2020-03-27 MED ORDER — ALBUTEROL SULFATE (2.5 MG/3ML) 0.083% IN NEBU
2.5000 mg | INHALATION_SOLUTION | RESPIRATORY_TRACT | Status: DC | PRN
  Administered 2020-04-03: 2.5 mg via RESPIRATORY_TRACT
  Filled 2020-03-27 (×2): qty 3

## 2020-03-27 MED ORDER — SODIUM CHLORIDE 0.9 % IV SOLN
1.0000 g | INTRAVENOUS | Status: DC
  Filled 2020-03-27: qty 10

## 2020-03-27 MED ORDER — SODIUM CHLORIDE 0.9 % IV SOLN: 500.0000 mg | Freq: Once | INTRAVENOUS | Status: DC

## 2020-03-27 MED ORDER — FUROSEMIDE 10 MG/ML IJ SOLN: 20.0000 mg | Freq: Once | INTRAMUSCULAR | Status: DC

## 2020-03-27 MED ORDER — SODIUM CHLORIDE 0.9 % IV SOLN
1.0000 g | Freq: Once | INTRAVENOUS | Status: AC
  Administered 2020-03-27: 1 g via INTRAVENOUS
  Filled 2020-03-27: qty 10

## 2020-03-27 MED ORDER — SODIUM CHLORIDE 0.9 % IV BOLUS (SEPSIS)
250.0000 mL | Freq: Once | INTRAVENOUS | Status: AC
  Administered 2020-03-27: 250 mL via INTRAVENOUS

## 2020-03-27 MED ORDER — PREGABALIN 25 MG PO CAPS: 25.0000 mg | ORAL_CAPSULE | Freq: Two times a day (BID) | ORAL | 0 refills | Status: DC

## 2020-03-27 MED ORDER — FLUTICASONE FUROATE-VILANTEROL 100-25 MCG/INH IN AEPB
1.0000 | INHALATION_SPRAY | Freq: Every day | RESPIRATORY_TRACT | Status: DC
  Administered 2020-03-28: 1 via RESPIRATORY_TRACT
  Filled 2020-03-27: qty 28

## 2020-03-27 MED ORDER — FUROSEMIDE 10 MG/ML IJ SOLN: 60.0000 mg | Freq: Once | INTRAMUSCULAR | Status: DC

## 2020-03-27 MED ORDER — SODIUM CHLORIDE 0.9 % IV SOLN
100.0000 mg | Freq: Two times a day (BID) | INTRAVENOUS | Status: AC
  Administered 2020-03-28 – 2020-03-31 (×8): 100 mg via INTRAVENOUS
  Filled 2020-03-27 (×9): qty 100

## 2020-03-27 NOTE — ED Notes (Signed)
Unsuccessful attempt to call report nurse if tied up

## 2020-03-27 NOTE — ED Notes (Signed)
Less agitated.

## 2020-03-27 NOTE — ED Provider Notes (Signed)
Recent decline MS. DDx pneumonia, CHF. Review results and Dispo, anticipate admit. Physical Exam  BP 117/67 (BP Location: Right Arm)   Pulse 72   Temp (!) 96.2 F (35.7 C) (Rectal)   Resp 14   Ht 4\' 6"  (1.372 m)   Wt 31.9 kg   SpO2 99%   BMI 16.96 kg/m   Physical Exam  ED Course/Procedures   Clinical Course as of Mar 28 2019  Wed Mar 27, 2020  1512 IMPRESSION: Airspace consolidation consistent with pneumonia left base with small left pleural effusion. Lungs elsewhere clear. Stable cardiac prominence.     [MT]  34 Pt signed out to Dr Colvin Caroli with plan to f/u on xray images and labs, possible PNA vs CHF, right hip pain   [MT]  2001 Awaiting return call for unassigned medical admission.  Will have repaged.   [MP]    Clinical Course User Index [MP] Charlesetta Shanks, MD [MT] Langston Masker Carola Rhine, MD    Procedures  MDM  Consult:Dr. Fair for [facility] hospitalist to admit.  Results have been reviewed with the patient's son.  Patient is confused.  Vital signs remained stable.  Treatment initiated for community-acquired pneumonia and UTI.  Per pharmacy, substitution for a azithromycin made with doxycycline.  Patient has been given Rocephin for community-acquired pneumonia and UTI.       Charlesetta Shanks, MD 03/27/20 2022

## 2020-03-27 NOTE — Assessment & Plan Note (Signed)
Diffused rales, wheezes, O2 desaturation, PNA, chronic bronchitis are contributory, continue O2 2-3 lpm via [place], ED eval.

## 2020-03-27 NOTE — Assessment & Plan Note (Addendum)
Lethargic, off Sertraline, reduced Lyrica, ED to eval/tx.

## 2020-03-27 NOTE — ED Triage Notes (Addendum)
To ED [facility] FROM [facility] [employer], in nursing care unit. EMS called for decline in mental status and mobility.  On arrival - pt is nonverbal, wheezing audible in upper airway, does grimace to pain.

## 2020-03-27 NOTE — ED Notes (Signed)
The pts eyes are open no verbal  The son has questions for the doctor

## 2020-03-27 NOTE — ED Notes (Signed)
The pt has scattered  Bruises  Over her body especially her legs  She has an abrasion to her rt lower leg that is red and angry appearing with a red raised rash to her posterior chest  ?? New or  ols

## 2020-03-27 NOTE — Assessment & Plan Note (Signed)
Off Amlodipine.

## 2020-03-27 NOTE — ED Notes (Signed)
Faint expirtatory wheezes present intermittently

## 2020-03-27 NOTE — ED Notes (Signed)
CBG Results of 115 reported to [place], Therapist, sports.

## 2020-03-27 NOTE — Assessment & Plan Note (Addendum)
PNA, O2 desaturation, O2 2-3lpm  via [place], continue prn Albuterol Neb q6hrs, Symbicort bid.

## 2020-03-27 NOTE — ED Notes (Signed)
Per Dr. Langston Masker - hold lasix until labs back

## 2020-03-27 NOTE — Progress Notes (Addendum)
Location:   [facility] Room Number: 34 Place of Service:  SNF (31) Provider:  Marda Stalker, Lennie Odor NP  [name], MD  Patient Care Team: [name], MD as PCP - General (Internal Medicine) [name], MD as PCP - Cardiology (Cardiology) [name], MD as Consulting Physician (Internal Medicine) Ngetich, Nelda Bucks, NP as Nurse Practitioner (Family Medicine)  Extended Emergency Contact Information Primary Emergency Contact: Vasques,Bob Address: [address]           [place], [place] Phone: [phone] Relation: Son Secondary Emergency Contact: Philmore Pal[place] Phone: [phone] Mobile Phone: [phone] Relation: Daughter  Code Status:  DNR Goals of care: Advanced Directive information Advanced Directives 04/15/2020  Does Patient Have a Medical Advance Directive? Yes  Type of Paramedic of [place];Out of facility DNR (pink MOST or yellow form)  Does patient want to make changes to medical advance directive? No - Patient declined  Copy of [place] in Chart? Yes - validated most recent copy scanned in chart (See row information)  Would patient like information on creating a medical advance directive? -  Pre-existing out of facility DNR order (yellow form or pink MOST form) Pink MOST/Yellow Form most recent copy in chart - Physician notified to receive inpatient order     Chief Complaint  Patient presents with  . Acute Visit    dehydration, acute renal injury    HPI:  Pt is a 86 y.o. female seen today for an acute visit for hypoxemia, O2 2-3lpm to maintain SatO2>90%, lethargy, wheezes, rales both lungs.   AKI, Bun/creat 92/2.98 03/26/20 started NS 75cc/hr, pending f/u BMP, off Torsemide/Kcl  PNA treated with Augmentin 03/26/20-04/02/20, CXR showed Bilateral Infiltrate possible pneumonia.  Lethargy, off Sertraline, reduced Lyrica  25mg  bid.   HTN, off Amlodipine.   Hx of OA, on Tylenol 650mg  bid. Chronic bronchitis, on prn Albuterol Neb q6hrs, Symbicort bid. Depression, on Wellbutrin 150mg  qd. GERD, stable, on Pantoprazole 40mg  qd, Famotidine 20mg  qd. Anemia, stable, on Fe. Dementia, taking Memantine 5mg  bid.    Past Medical History:  Diagnosis Date  . Amputation of finger of right hand   . Aortic stenosis, mild 03/03/2016   By echo 08/2015  . Arthritis   . Back pain   . Bradycardia 08/28/2015  . CKD (chronic kidney disease) stage 3, GFR 30-59 ml/min   . Closed right hip fracture ([place]) 12/2018  . Depression   . GERD (gastroesophageal reflux disease)   . GI bleed due to NSAIDs   . Hyperlipidemia   . Hypertension   . Macular degeneration   . Maxillary sinusitis   . MVP (mitral valve prolapse) 03/03/2016   Bileaflet MVP with mild MR by echo 2016  . Old MI (myocardial infarction)    NSTEMI secondary to stress MI/Takotsubo CM, minimal nonobstructive ASCAD at cath  . Osteoporosis   . Persistent atrial fibrillation ([place]) 2006   not on anticoagulation due to GI bleed and increased fall risk  . PMR (polymyalgia rheumatica) (HCC)   . SIADH (syndrome of inappropriate ADH production) ([place])   . Takotsubo cardiomyopathy    EF normalized to 70%  . Ventricular tachycardia ([place])    on initial presentation of MI   Past Surgical History:  Procedure Laterality Date  . CARDIAC CATHETERIZATION     normal coronary arteries  . CHOLECYSTECTOMY  11/10/2011   Procedure: LAPAROSCOPIC CHOLECYSTECTOMY WITH INTRAOPERATIVE CHOLANGIOGRAM;  Surgeon: Pedro Earls, MD;  Location: [facility];  Service: General;  Laterality: N/A;  . EYE SURGERY  06/10/11   membrane peel   . INTRAMEDULLARY (IM) NAIL INTERTROCHANTERIC Right 12/21/2018   Procedure: INTRAMEDULLARY (IM) NAIL INTERTROCHANTRIC;  Surgeon: Nicholes Stairs, MD;  Location: [facility];  Service: Orthopedics;  Laterality: Right;  . NASAL SINUS SURGERY Left   . ROTATOR CUFF REPAIR   1998  . UMBILICAL HERNIA REPAIR  11/10/2011   Procedure: HERNIA REPAIR UMBILICAL ADULT;  Surgeon: Pedro Earls, MD;  Location: [facility];  Service: General;  Laterality: N/A;  . WISDOM TOOTH EXTRACTION      Allergies  Allergen Reactions  . Amlodipine Swelling    To feet and ankles   . Avelox [Moxifloxacin Hcl In Nacl] Other (See Comments)    Pt does not remember   . Biaxin [Clarithromycin]   . Ceftin [Cefuroxime Axetil]   . Duragesic Disc Transdermal System [Fentanyl] Other (See Comments)    Reaction unknown  . Forteo [Parathyroid Hormone (Recomb)] Other (See Comments)    Made skin dry  . Morphine And Related Other (See Comments)    "My Sister and daughter can,t take it. I've had it before without problems."  . Teriparatide   . Lidocaine Rash    Allergies as of 03/27/2020      Reactions   Amlodipine Swelling   To feet and ankles    Avelox [moxifloxacin Hcl In Nacl] Other (See Comments)   Pt does not remember   Biaxin [clarithromycin]    Ceftin [cefuroxime Axetil]    Duragesic Disc Transdermal System [fentanyl] Other (See Comments)   Reaction unknown   Forteo [parathyroid Hormone (recomb)] Other (See Comments)   Made skin dry   Morphine And Related Other (See Comments)   "My Sister and daughter can,t take it. I've had it before without problems."   Teriparatide    Lidocaine Rash      Medication List       Accurate as of March 27, 2020  2:20 PM. If you have any questions, ask your nurse or doctor.        STOP taking these medications   amLODipine 5 MG tablet Commonly known as: NORVASC   carvedilol 12.5 MG tablet Commonly known as: COREG   sertraline 25 MG tablet Commonly known as: ZOLOFT     TAKE these medications   acetaminophen 500 MG tablet Commonly known as: TYLENOL Take 500 mg by mouth every 6 (six) hours as needed for moderate pain. Every 6hrs.   acetaminophen 325 MG tablet Commonly known as: TYLENOL Take 650 mg by mouth 2 (two) times daily.     albuterol (2.5 MG/3ML) 0.083% nebulizer solution Commonly known as: PROVENTIL Take 2.5 mg by nebulization as directed. Take 1 neb inhalation every 6 hours and Take 1 neb inhalation every 6 hours PRN FOR SOB AND WHEEZING   amoxicillin-clavulanate 500-125 MG tablet Commonly known as: AUGMENTIN Take 1 tablet by mouth every 8 (eight) hours.   azelastine 0.1 % nasal spray Commonly known as: ASTELIN Place 2 sprays into both nostrils daily.   budesonide-formoterol 80-4.5 MCG/ACT inhaler Commonly known as: SYMBICORT Inhale 2 puffs into the lungs 2 (two) times daily.   buPROPion 150 MG 12 hr tablet Commonly known as: WELLBUTRIN SR Take 1 tablet (150 mg total) by mouth every morning.   cephALEXin 250 MG capsule Commonly known as: KEFLEX Take 1 capsule (250 mg total) by mouth 4 (four) times daily for 3 days.  cetirizine 10 MG tablet Commonly known as: ZYRTEC Take 10 mg by mouth daily.   demeclocycline 150 MG tablet Commonly known as: DECLOMYCIN Take 150 mg by mouth 2 (two) times daily.   diclofenac sodium 1 % Gel Commonly known as: VOLTAREN Apply 1 application topically 4 (four) times daily as needed (pain).   diclofenac Sodium 1 % Gel Commonly known as: VOLTAREN Apply 1 application topically 4 (four) times daily.   famotidine 20 MG tablet Commonly known as: PEPCID Take 20 mg by mouth daily.   feeding supplement (PRO-STAT SUGAR FREE 64) Liqd Take 30 mLs by mouth daily.   fluticasone 50 MCG/ACT nasal spray Commonly known as: FLONASE Place 2 sprays into both nostrils daily.   Iron 325 (65 Fe) MG Tabs Take 1 tablet by mouth every other day. Give with meals   Lubricating Eye Drops 0.5-0.9 % ophthalmic solution Generic drug: carboxymethylcellul-glycerin Place 1 drop into both eyes 3 (three) times daily as needed for dry eyes. For allergies   memantine 5 MG tablet Commonly known as: NAMENDA Take 5 mg by mouth 2 (two) times daily.   NYSTATIN EX Apply 100,000 Units  topically 2 (two) times daily. Apply to left and right breast for rash.   Nyamyc powder Generic drug: nystatin   nystatin cream Commonly known as: MYCOSTATIN Apply 1 application topically 2 (two) times daily.   ondansetron 4 MG disintegrating tablet Commonly known as: ZOFRAN-ODT Take 4 mg by mouth every 6 (six) hours as needed for nausea or vomiting.   ondansetron 4 MG tablet Commonly known as: ZOFRAN Take 1 tablet (4 mg total) by mouth every 6 (six) hours as needed for nausea.   pantoprazole 40 MG tablet Commonly known as: PROTONIX Take 40 mg by mouth daily.   polyethylene glycol 17 g packet Commonly known as: MIRALAX / GLYCOLAX Take 17 g by mouth daily as needed for mild constipation.   pregabalin 25 MG capsule Commonly known as: LYRICA Take 25 mg by mouth 2 (two) times daily. What changed: Another medication with the same name was removed. Continue taking this medication, and follow the directions you see here.   silver sulfADIAZINE 1 % cream Commonly known as: SILVADENE   torsemide 20 MG tablet Commonly known as: DEMADEX Take 40 mg by mouth daily.   triamcinolone cream 0.5 % Commonly known as: KENALOG Apply 1 application topically 2 (two) times daily.   zinc oxide 20 % ointment Apply 1 application topically as needed for irritation. To buttocks after every incontinent episode and as needed for redness. May keep at bedside      ROS was provided with assistance of staff.  Review of Systems  Constitutional: Positive for activity change, appetite change and fatigue. Negative for fever.       Lethargic  HENT: Positive for hearing loss. Negative for congestion and voice change.   Eyes: Negative for visual disturbance.  Respiratory: Positive for shortness of breath and wheezing. Negative for cough.   Cardiovascular: Positive for leg swelling. Negative for chest pain and palpitations.  Gastrointestinal: Negative for abdominal pain, constipation, diarrhea, nausea and  vomiting.       C/o nauseated this morning, but better now, did not vomit, c/o loose stools.   Genitourinary: Negative for difficulty urinating, dysuria and urgency.  Musculoskeletal: Positive for arthralgias, back pain, gait problem and myalgias.       Right hip, lower back.   Skin: Positive for rash. Negative for color change.  Neurological: Negative for dizziness, speech difficulty  and weakness.       More confused.   Psychiatric/Behavioral: Positive for confusion. Negative for sleep disturbance.    Immunization History  Administered Date(s) Administered  . DTaP 12/14/2011  . Influenza, High Dose Seasonal PF 07/12/2017, 08/03/2019  . Moderna SARS-COVID-2 Vaccination 10/23/2019, 11/18/2019  . Pneumococcal Conjugate-13 06/01/2014  . Pneumococcal Polysaccharide-23 06/11/1999   Pertinent  Health Maintenance Due  Topic Date Due  . DEXA SCAN  Never done  . INFLUENZA VACCINE  05/19/2020  . PNA vac Low Risk Adult  Completed   No flowsheet data found. Functional Status Survey:    Vitals:   03/27/20 0952  BP: 110/66  Pulse: 70  Resp: 16  Temp: (!) 97.1 F (36.2 C)  SpO2: 92%  Weight: 132 lb 6.4 oz (60.1 kg)  Height: 4\' 6"  (1.372 m)   Body mass index is 31.92 kg/m. Physical Exam Vitals and nursing note reviewed.  Constitutional:      General: She is in acute distress.     Appearance: She is ill-appearing.     Comments: lethargic  HENT:     Head: Normocephalic and atraumatic.     Mouth/Throat:     Mouth: Mucous membranes are dry.  Eyes:     Conjunctiva/sclera: Conjunctivae normal.     Pupils: Pupils are equal, round, and reactive to light.  Cardiovascular:     Rate and Rhythm: Normal rate and regular rhythm.     Heart sounds: Murmur present.     Comments: HR in 60 upon my examination Pulmonary:     Breath sounds: Wheezing, rhonchi and rales present.     Comments: Rales, rhonchi, wheezes both lungs.  Abdominal:     General: Bowel sounds are normal.      Tenderness: There is no abdominal tenderness. There is no guarding or rebound.  Musculoskeletal:     Cervical back: Normal range of motion and neck supple.     Right lower leg: Edema present.     Left lower leg: Edema present.     Comments: Trace edema BLE, mostly in feet/ankle. Chronic pain in the lower back, right hip.  Skin:    General: Skin is warm and dry.     Findings: Rash present.     Comments: Back, macular, itching, redness under the R+L breasts R.>L, peeling  Neurological:     General: No focal deficit present.     Motor: No weakness.     Gait: Gait abnormal.     Comments: Oriented to person, place.   Psychiatric:     Comments: Lethargic.      Labs reviewed: Recent Labs    03/28/20 0140 03/29/20 1506 04/01/20 0425 04/01/20 0425 04/02/20 0441 04/03/20 0240 04/09/20 0000  NA 145   < > 147*   < > 145 142 138  K 4.0   < > 3.9   < > 3.5 3.3* 3.6  CL 107   < > 107   < > 107 104 100  CO2 30   < > 28   < > 27 28 29*  GLUCOSE 54*   < > 106*  --  84 85  --   BUN 68*   < > 21   < > 26* 38* 54*  CREATININE 1.73*   < > 0.82   < > 0.99 1.24* 1.3*  CALCIUM 8.3*   < > 8.3*   < > 8.2* 7.9* 7.7*  MG 2.2  --   --   --   --   --   --  PHOS 4.2  --   --   --   --   --   --    < > = values in this interval not displayed.   Recent Labs    11/23/19 0000 11/23/19 0000 12/26/19 0000 03/27/20 0000 04/09/20 0000  AST 15   < > 16 32 27  ALT 13   < > 15 34 30  ALKPHOS 82   < > 108 201* 169*  ALBUMIN 3.0*  --  3.0* 2.9*  --    < > = values in this interval not displayed.   Recent Labs    04/01/20 0425 04/01/20 0425 04/02/20 0441 04/03/20 0240 04/09/20 0000  WBC 14.3*   < > 20.3* 21.0* 12.4  NEUTROABS  --   --  16.8* 17.8* 10,292  HGB 12.9   < > 12.7 11.2* 9.9*  HCT 40.2   < > 39.3 34.0* 30*  MCV 97.3  --  94.9 93.9  --   PLT 143*   < > 156 171 457*   < > = values in this interval not displayed.   Lab Results  Component Value Date   TSH 2.600 09/19/2018   No  results found for: HGBA1C No results found for: CHOL, HDL, LDLCALC, LDLDIRECT, TRIG, CHOLHDL  Significant Diagnostic Results in last 30 days:  CT HEAD WO CONTRAST  Result Date: 03/27/2020 CLINICAL DATA:  86 year old female with encephalopathy. EXAM: CT HEAD WITHOUT CONTRAST TECHNIQUE: Contiguous axial images were obtained from the base of the skull through the vertex without intravenous contrast. COMPARISON:  Head CT 11/14/2005. FINDINGS: Brain: No midline shift, mass effect, or evidence of intracranial mass lesion. Cerebral volume is within normal limits for age. No ventriculomegaly. No acute intracranial hemorrhage identified. Widespread, patchy hypodensity in the bilateral cerebral white matter, deep gray nuclei. Progression since 2007. No acute cortically based infarct or cortical encephalomalacia identified. Vascular: Calcified atherosclerosis at the skull base. No suspicious intracranial vascular hyperdensity. Skull: Advanced chronic degeneration at the skull base-C1 and odontoid. Associated cervicomedullary junction stenosis (series 3, image 1). No acute osseous abnormality identified. Sinuses/Orbits: Visualized paranasal sinuses and mastoids are stable and well pneumatized. Other: No acute orbit or scalp soft tissue finding. IMPRESSION: 1. No acute intracranial abnormality. Evidence of small vessel disease with progression since 2007. 2. Very severe degeneration of the skull base, C1, and odontoid with cervicomedullary junction stenosis. Electronically Signed   By: Genevie Ann M.D.   On: 03/27/2020 21:23   DG CHEST PORT 1 VIEW  Result Date: 04/01/2020 CLINICAL DATA:  Shortness of breath, altered mental status EXAM: PORTABLE CHEST 1 VIEW COMPARISON:  03/27/2020 FINDINGS: Stable cardiomediastinal contours. Atherosclerotic calcification of the aortic knob. Small left pleural effusion with associated left basilar opacity similar in appearance to prior. Right lung appears clear. No pneumothorax.  Demineralized osseous structures with chronic arthropathy of the bilateral shoulders and numerous prior kyphoplasty/vertebroplasty changes of the visualized thoracic spine. IMPRESSION: Small left pleural effusion with associated left basilar opacity, similar in appearance to prior. Electronically Signed   By: Davina Poke D.O.   On: 04/01/2020 11:23   DG Chest Portable 1 View  Result Date: 03/27/2020 CLINICAL DATA:  Shortness of breath EXAM: PORTABLE CHEST 1 VIEW COMPARISON:  Feb 20, 2019 FINDINGS: There is airspace opacity in the left base with small left pleural effusion. The lungs elsewhere are clear. The heart is upper normal in size with pulmonary vascularity normal. There is aortic atherosclerosis. No adenopathy appreciable. There have been previous  kyphoplasty procedures in the upper lumbar region. There is arthropathy in each shoulder with superior migration of each humeral head. Bones are osteoporotic. IMPRESSION: Airspace consolidation consistent with pneumonia left base with small left pleural effusion. Lungs elsewhere clear. Stable cardiac prominence. Aortic Atherosclerosis (ICD10-I70.0). Bones osteoporotic. Probable chronic rotator cuff tears bilaterally, characterized by superior migration of each humeral head. Electronically Signed   By: Lowella Grip III M.D.   On: 03/27/2020 15:04   ECHOCARDIOGRAM COMPLETE  Result Date: 04/02/2020    ECHOCARDIOGRAM REPORT   Patient Name:   MERNA BALDI Date of Exam: 04/02/2020 Medical Rec #:  [id number]                Height:       54.0 in Accession #:    [id number]               Weight:       135.0 lb Date of Birth:  August 19, 1923                 BSA:          1.464 m Patient Age:    89 years                 BP:           120/50 mmHg Patient Gender: F                        HR:           86 bpm. Exam Location:  Inpatient Procedure: 2D Echo Indications:    CHF- Acute Diastolic D14.97  History:        Patient has prior history of Echocardiogram  examinations, most                 recent 09/28/2018. Cardiomyopathy, Previous Myocardial                 Infarction, Aortic Valve Disease, Arrythmias:Atrial                 Fibrillation; Risk Factors:Hypertension.  Sonographer:    [name] RDCS (AE) Referring Phys: [place]  1. There is systolic left ventricular cavity obliteration with a mid chamber "gradient" up to 3 m/s. There is no evidence of systolic anterior motion of the mitral leaflet or true subvalvular dynamic obstruction and there are minimal degenerative valve leaflet changes. . Left ventricular ejection fraction, by estimation, is >75%. The left ventricle has hyperdynamic function. The left ventricle has no regional wall motion abnormalities. Left ventricular diastolic parameters are consistent with Grade I diastolic dysfunction (impaired relaxation).  2. Right ventricular systolic function is normal. The right ventricular size is normal. There is normal pulmonary artery systolic pressure. The estimated right ventricular systolic pressure is 02.6 mmHg.  3. The mitral valve is normal in structure. Mild mitral valve regurgitation.  4. Mildly increased aortic valve gradients due to hyperdynamic left ventricular function. The aortic valve is normal in structure. Aortic valve regurgitation is not visualized. No aortic stenosis is present.  5. The inferior vena cava is normal in size with greater than 50% respiratory variability, suggesting right atrial pressure of 3 mmHg. Comparison(s): No significant change from prior study. Prior images reviewed side by side. FINDINGS  Left Ventricle: There is systolic left ventricular cavity obliteration with a mid chamber "gradient" up to 3 m/s. There is no evidence of systolic anterior motion of the mitral leaflet  or true subvalvular dynamic obstruction and there are minimal degenerative valve leaflet changes. Left ventricular ejection fraction, by estimation, is >75%. The left  ventricle has hyperdynamic function. The left ventricle has no regional wall motion abnormalities. The left ventricular internal cavity size was small. There is no left ventricular hypertrophy. Left ventricular diastolic parameters are consistent with Grade I diastolic dysfunction (impaired relaxation). Indeterminate filling pressures. Right Ventricle: The right ventricular size is normal. No increase in right ventricular wall thickness. Right ventricular systolic function is normal. There is normal pulmonary artery systolic pressure. The tricuspid regurgitant velocity is 2.74 m/s, and  with an assumed right atrial pressure of 3 mmHg, the estimated right ventricular systolic pressure is 95.2 mmHg. Left Atrium: Left atrial size was normal in size. Right Atrium: Right atrial size was normal in size. Pericardium: There is no evidence of pericardial effusion. Mitral Valve: The mitral valve is normal in structure. There is mild late systolic prolapse of both leaflets of the mitral valve. Mild mitral valve regurgitation. Tricuspid Valve: The tricuspid valve is normal in structure. Tricuspid valve regurgitation is trivial. Aortic Valve: Mildly increased aortic valve gradients due to hyperdynamic left ventricular function. The aortic valve is normal in structure. Aortic valve regurgitation is not visualized. No aortic stenosis is present. Aortic valve mean gradient measures  8.0 mmHg. Aortic valve peak gradient measures 20.1 mmHg. Aortic valve area, by VTI measures 1.69 cm. Pulmonic Valve: The pulmonic valve was grossly normal. Pulmonic valve regurgitation is trivial. Aorta: The aortic root was not well visualized. Venous: The inferior vena cava is normal in size with greater than 50% respiratory variability, suggesting right atrial pressure of 3 mmHg. IAS/Shunts: No atrial level shunt detected by color flow Doppler.  LEFT VENTRICLE PLAX 2D LVIDd:         2.80 cm  Diastology LVIDs:         2.00 cm  LV e' lateral:   5.87  cm/s LV PW:         1.00 cm  LV E/e' lateral: 12.1 LV IVS:        1.10 cm  LV e' medial:    3.92 cm/s LVOT diam:     2.00 cm  LV E/e' medial:  18.1 LV SV:         63 LV SV Index:   43 LVOT Area:     3.14 cm  RIGHT VENTRICLE TAPSE (M-mode): 1.3 cm LEFT ATRIUM           Index       RIGHT ATRIUM           Index LA diam:      1.80 cm 1.23 cm/m  RA Area:     11.00 cm LA Vol (A2C): 11.1 ml 7.58 ml/m  RA Volume:   20.80 ml  14.21 ml/m LA Vol (A4C): 40.5 ml 27.67 ml/m  AORTIC VALVE AV Area (Vmax):    1.50 cm AV Area (Vmean):   1.58 cm AV Area (VTI):     1.69 cm AV Vmax:           224.00 cm/s AV Vmean:          131.000 cm/s AV VTI:            0.371 m AV Peak Grad:      20.1 mmHg AV Mean Grad:      8.0 mmHg LVOT Vmax:         107.00 cm/s LVOT Vmean:  65.700 cm/s LVOT VTI:          0.199 m LVOT/AV VTI ratio: 0.54  AORTA Ao Root diam: 2.40 cm MITRAL VALVE                TRICUSPID VALVE MV Area (PHT): 3.54 cm     TR Peak grad:   30.0 mmHg MV Decel Time: 214 msec     TR Vmax:        274.00 cm/s MV E velocity: 71.10 cm/s MV A velocity: 174.00 cm/s  SHUNTS MV E/A ratio:  0.41         Systemic VTI:  0.20 m                             Systemic Diam: 2.00 cm Dani Gobble Croitoru MD Electronically signed by Sanda Klein MD Signature Date/Time: 04/02/2020/2:19:17 PM    Final    DG Hip Port Unilat W or Wo Pelvis 1 View Right  Result Date: 03/27/2020 CLINICAL DATA:  Recent hip surgery, multiple falls EXAM: DG HIP (WITH OR WITHOUT PELVIS) 1V PORT RIGHT COMPARISON:  12/21/2018 FINDINGS: Frontal view of the pelvis as well as frontal view of the right hip are obtained. Since the prior exam, intramedullary rod with proximal dynamic and distal interlocking screw has been placed across the previously seen intertrochanteric right hip fracture. There has been bony resorption of the femoral head, with migration of the dynamic screw now resulting in acetabular roof erosion. Residual fracture line can be seen along the remaining portion  of the femoral neck consistent with nonunion. No acute bony abnormality.  Stable left hip osteoarthritis. IMPRESSION: 1. 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 Bony resorption of the majority of the right femoral head, with bony erosion of the acetabular roof by the dynamic screw. 2. Stable left hip osteoarthritis. Electronically Signed   By: Randa Ngo M.D.   On: 03/27/2020 15:29    Assessment/Plan Altered mental status Lethargic, off Sertraline, reduced Lyrica, ED to eval/tx.   Acute kidney injury superimposed on CKD ([place]) AKI, Bun/creat 92/2.98 03/26/20 started NS 75cc/hr, pending f/u BMP, off Torsemide/Kcl   Essential hypertension, benign Off Amlodipine.   Persistent atrial fibrillation (HCC) Heart rate is in control. Not on anticoagulation due to Hx of GI bleed.   Chronic bronchitis ([facility]) PNA, O2 desaturation, O2 2-3lpm  via [place], continue prn Albuterol Neb q6hrs, Symbicort bid.  Esophageal reflux Stable, continue Pantoprazole, Famotidine. Thin liquids.   SIADH (syndrome of inappropriate ADH production) ([place]) Na 138 03/26/20  Depression due to dementia ([place]) Lethargic, off Sertraline, continue Wellbutrin.   Anemia Stable, Hgb 12.1, hemoconcentration, continue Fe.    Cognitive impairment More confused, lethargic. Continue Memantine  Hypoxemia Diffused rales, wheezes, O2 desaturation, PNA, chronic bronchitis are contributory, continue O2 2-3 lpm via [place], ED eval.   Acute on chronic diastolic CHF (congestive heart failure) ([place]) 04/15/20 CXR pulmonary infiltrate in the left lung base and small pleural effusion, CHF vs PNA. Completed Keflex for left lower lobe PNA/UTI about 10 days ago. Monitor for s/s PNA. Increase Torsemide 40mg  qam 10mg  q2pm, BMP one week  Revised MOST comfort measures, limitation of ABT, no IVF    Family/ staff Communication: plan of care reviewed with the patient and charge nurse.   Labs/tests ordered:  CXR  04/15/20. BMP one week.   Time spend 35 minutes.

## 2020-03-27 NOTE — ED Notes (Signed)
repoprt called to rn on 6n

## 2020-03-27 NOTE — Assessment & Plan Note (Signed)
More confused, lethargic. Continue Memantine

## 2020-03-27 NOTE — Assessment & Plan Note (Addendum)
Heart rate is in control. Not on anticoagulation due to Hx of GI bleed.

## 2020-03-27 NOTE — Assessment & Plan Note (Signed)
Na 138 03/26/20

## 2020-03-27 NOTE — Assessment & Plan Note (Signed)
AKI, Bun/creat 92/2.98 03/26/20 started NS 75cc/hr, pending f/u BMP, off Torsemide/Kcl

## 2020-03-27 NOTE — ED Notes (Signed)
thge pt appears agitated  Not sure why

## 2020-03-27 NOTE — Assessment & Plan Note (Signed)
Stable, Hgb 12.1, hemoconcentration, continue Fe.

## 2020-03-27 NOTE — H&P (Addendum)
[facility] History and Physical  [name] DJS:[id number] DOB: [birth year] DOA: 03/27/2020  Referring EDP: [name] PCP: [name], MD   Chief Complaint: AMS  HPI: [name] is a 86 y.o. female with PMH of right hip fracture s/p fixation March 2020, Takotsubo cardiomyopathy, aortic stenosis and mild dementia who presented to ED with AMS and admitted for CAP and UTI.  Patient non-verbal currently. All history provided by patient's son. [name], [name], reports that patient lives in a nursing home after her physical decline following hip fracture. At baseline, she walks with a walker and converses normally. Reports only very mild dementia that is usually not noticeable. He went to visit her on Sunday and noticed that she was not quite as mentally clear and her speech was a little different. His sister saw her on Monday and noted similar findings. Yesterday they obtained imaging which was c/w PNA and patient was started on Doxycyline. [name] went to see her today and noticed a marked decline. She was completely disoriented, not speaking and not walking. The NP at the nursing home obtained labs and was concerned for dehydration and gave IVF. They then recommended she come to the ER.   In the ED: Vitals stable initially on room air and then RN placed on 1.5 L O2 after desat to 88%. Labs remarkable for COVID neg, glucose 56, Cr 2.29, WBC 15.3, BNP 244, Trop 10>12, UA with large leuks and > 50 WBCs. Lactate 0.9.   CXR: Airspace consolidation consistent with pneumonia left base with small left pleural effusion. Lungs elsewhere clear. Stable cardiac Prominence.  XR Hip: 1. 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 Bony resorption of the majority of the right femoral head, with bony erosion of the acetabular roof by the dynamic screw. 2. Stable left hip osteoarthritis.  Patient was given IVF, Doxy and Rocephin and called for  admission.   Review of Systems:  Unable to fully obtain due to AMS.  Past Medical History:  Diagnosis Date  . Amputation of finger of right hand   . Aortic stenosis, mild 03/03/2016   By echo 08/2015  . Arthritis   . Back pain   . Bradycardia 08/28/2015  . CKD (chronic kidney disease) stage 3, GFR 30-59 ml/min   . Closed right hip fracture ([place]) 12/2018  . Depression   . GERD (gastroesophageal reflux disease)   . GI bleed due to NSAIDs   . Hyperlipidemia   . Hypertension   . Macular degeneration   . Maxillary sinusitis   . MVP (mitral valve prolapse) 03/03/2016   Bileaflet MVP with mild MR by echo 2016  . Old MI (myocardial infarction)    NSTEMI secondary to stress MI/Takotsubo CM, minimal nonobstructive ASCAD at cath  . Osteoporosis   . Persistent atrial fibrillation ([place]) 2006   not on anticoagulation due to GI bleed and increased fall risk  . PMR (polymyalgia rheumatica) (HCC)   . SIADH (syndrome of inappropriate ADH production) ([place])   . Takotsubo cardiomyopathy    EF normalized to 70%  . Ventricular tachycardia ([place])    on initial presentation of MI   Past Surgical History:  Procedure Laterality Date  . CARDIAC CATHETERIZATION     normal coronary arteries  . CHOLECYSTECTOMY  11/10/2011   Procedure: LAPAROSCOPIC CHOLECYSTECTOMY WITH INTRAOPERATIVE CHOLANGIOGRAM;  Surgeon: Pedro Earls, MD;  Location: [facility];  Service: General;  Laterality: N/A;  . EYE SURGERY  06/10/11   membrane  peel   . INTRAMEDULLARY (IM) NAIL INTERTROCHANTERIC Right 12/21/2018   Procedure: INTRAMEDULLARY (IM) NAIL INTERTROCHANTRIC;  Surgeon: Nicholes Stairs, MD;  Location: [facility];  Service: Orthopedics;  Laterality: Right;  . NASAL SINUS SURGERY Left   . ROTATOR CUFF REPAIR  1998  . UMBILICAL HERNIA REPAIR  11/10/2011   Procedure: HERNIA REPAIR UMBILICAL ADULT;  Surgeon: Pedro Earls, MD;  Location: [facility];  Service: General;  Laterality: N/A;  . WISDOM TOOTH EXTRACTION     Social  History:  reports that she has never smoked. She has never used smokeless tobacco. She reports that she does not drink alcohol or use drugs.  Allergies  Allergen Reactions  . Amlodipine Swelling    To feet and ankles   . Avelox [Moxifloxacin Hcl In [place]] Other (See Comments)    Pt does not remember   . Biaxin [Clarithromycin]   . Ceftin [Cefuroxime Axetil]   . Duragesic Disc Transdermal System [Fentanyl] Other (See Comments)    Reaction unknown  . Forteo [Parathyroid Hormone (Recomb)] Other (See Comments)    Made skin dry  . Morphine And Related Other (See Comments)    "My Sister and daughter can,t take it. I've had it before without problems."  . Teriparatide   . Lidocaine Rash    Family History  Problem Relation Age of Onset  . Cancer Mother     Prior to Admission medications   Medication Sig Start Date End Date Taking? Authorizing Provider  acetaminophen (TYLENOL) 325 MG tablet Take 650 mg by mouth 2 (two) times daily.    Yes [provider]  acetaminophen (TYLENOL) 500 MG tablet Take 500 mg by mouth every 6 (six) hours as needed for moderate pain. Every 6hrs.   Yes [provider]  albuterol (PROVENTIL) (2.5 MG/3ML) 0.083% nebulizer solution Take 2.5 mg by nebulization as directed. Take 1 neb inhalation every 6 hours and Take 1 neb inhalation every 6 hours PRN FOR SOB AND WHEEZING   Yes [provider]  Amino Acids-Protein Hydrolys (FEEDING SUPPLEMENT, PRO-STAT SUGAR FREE 64,) LIQD Take 30 mLs by mouth daily.   Yes [provider]  amoxicillin-clavulanate (AUGMENTIN) 500-125 MG tablet Take 1 tablet by mouth every 8 (eight) hours. 03/26/20 04/02/20 Yes [provider]  azelastine (ASTELIN) 0.1 % nasal spray Place 2 sprays into both nostrils daily.    Yes [provider]  budesonide-formoterol (SYMBICORT) 80-4.5 MCG/ACT inhaler Inhale 2 puffs into the lungs 2 (two) times daily.    Yes [provider]  buPROPion  (WELLBUTRIN SR) 150 MG 12 hr tablet Take 1 tablet (150 mg total) by mouth every morning. 12/26/18  Yes Roney Jaffe, MD  carboxymethylcellul-glycerin (LUBRICATING EYE DROPS) 0.5-0.9 % ophthalmic solution Place 1 drop into both eyes 3 (three) times daily as needed for dry eyes. For allergies   Yes [provider]  cetirizine (ZYRTEC) 10 MG tablet Take 10 mg by mouth daily.    Yes [provider]  demeclocycline (DECLOMYCIN) 150 MG tablet Take 150 mg by mouth 2 (two) times daily.  08/17/11  Yes [provider]  diclofenac sodium (VOLTAREN) 1 % GEL Apply 1 application topically 4 (four) times daily as needed (pain).    Yes [provider]  famotidine (PEPCID) 20 MG tablet Take 20 mg by mouth daily.   Yes [provider]  Ferrous Sulfate (IRON) 325 (65 Fe) MG TABS Take 1 tablet by mouth every other day. Give with meals   Yes [provider]  fluticasone (FLONASE) 50 MCG/ACT nasal spray Place 2 sprays into both nostrils daily.    Yes [provider]  memantine (NAMENDA) 5 MG tablet Take 5 mg by mouth 2 (two) times daily.    Yes [provider]  nystatin cream (MYCOSTATIN) Apply 1 application topically 2 (two) times daily.  03/25/20  Yes [provider]  ondansetron (ZOFRAN-ODT) 4 MG disintegrating tablet Take 4 mg by mouth every 6 (six) hours as needed for nausea or vomiting.  01/19/20  Yes [provider]  pantoprazole (PROTONIX) 40 MG tablet Take 40 mg by mouth daily.   Yes [provider]  polyethylene glycol (MIRALAX / GLYCOLAX) packet Take 17 g by mouth daily as needed for mild constipation.    Yes [provider]  torsemide (DEMADEX) 20 MG tablet Take 20 mg by mouth daily.  06/26/19  Yes [provider]  triamcinolone cream (KENALOG) 0.5 % Apply 1 application topically 2 (two) times daily.   Yes [provider]  zinc oxide 20 % ointment Apply 1 application topically as needed for  irritation. To buttocks after every incontinent episode and as needed for redness. May keep at bedside   Yes [provider]  ondansetron (ZOFRAN) 4 MG tablet Take 1 tablet (4 mg total) by mouth every 6 (six) hours as needed for nausea. Patient not taking: Reported on 03/27/2020 12/23/18   Cristal Deer, MD  pregabalin (LYRICA) 25 MG capsule Take 1 capsule (25 mg total) by mouth 2 (two) times daily. 03/27/20   [name], MD   Physical Exam: Vitals:   03/27/20 1645 03/27/20 1700 03/27/20 1744 03/27/20 1745  BP: (!) 153/70 139/73  132/75  Pulse: 72 74  73  Resp: 15 17  14   Temp:   (!) 96.6 F (35.9 C)   TempSrc:   Rectal   SpO2: 100% 100%  100%  Weight:      Height:        Wt Readings from Last 3 Encounters:  03/27/20 31.9 kg  03/27/20 60.1 kg  03/26/20 60.1 kg    . General:  Resting in bed with eyes open. Makes minimal eye contact. Non-verbal currently. Reacts to pain. Alert.  . Eyes: EOMI, normal lids, irises & conjunctiva . ENT: grossly normal lips & tongue; poor dentition . Neck: normal ROM . Cardiovascular: RRR, no m/r/g. 2+ pedal edema . Respiratory: Mild expiratory wheezing. Scattered rales. . Abdomen: soft, no distention, some reaction with deep palpation . Skin: no rash or induration seen on limited exam . Musculoskeletal: grossly normal tone BUE/BLE . Psychiatric: unable to assess  . Neurologic: moving all extremities, unable to follow commands          Labs on Admission:  Basic Metabolic Panel: Recent Labs  Lab 03/27/20 0000 03/27/20 1436  NA 138 142  K 4.7 4.2  CL 96* 103  CO2 31* 27  GLUCOSE  --  56*  BUN 92* 86*  CREATININE 3.0* 2.29*  CALCIUM 8.9 8.7*   Liver Function Tests: Recent Labs  Lab 03/27/20 0000  AST 32  ALT 34  ALKPHOS 201*  ALBUMIN 2.9*   No results for input(s): LIPASE, AMYLASE in the last 168 hours. No results for input(s): AMMONIA in the last 168 hours. CBC: Recent Labs  Lab 03/27/20 0000 03/27/20 1436  WBC  9.2 15.3*  NEUTROABS 7,682 13.9*  HGB 12.1 11.9*  HCT 36 36.8  MCV  --  96.3  PLT 203 188  Cardiac Enzymes: No results for input(s): CKTOTAL, CKMB, CKMBINDEX, TROPONINI in the last 168 hours.  BNP (last 3 results) Recent Labs    04/26/19 0000 05/02/19 0000 03/27/20 1436  BNP 95 95 244.9*    ProBNP (last 3 results) No results for input(s): PROBNP in the last 8760 hours.  CBG: No results for input(s): GLUCAP in the last 168 hours.  Radiological Exams on Admission: DG Chest Portable 1 View  Result Date: 03/27/2020 CLINICAL DATA:  Shortness of breath EXAM: PORTABLE CHEST 1 VIEW COMPARISON:  Feb 20, 2019 FINDINGS: There is airspace opacity in the left base with small left pleural effusion. The lungs elsewhere are clear. The heart is upper normal in size with pulmonary vascularity normal. There is aortic atherosclerosis. No adenopathy appreciable. There have been previous kyphoplasty procedures in the upper lumbar region. There is arthropathy in each shoulder with superior migration of each humeral head. Bones are osteoporotic. IMPRESSION: Airspace consolidation consistent with pneumonia left base with small left pleural effusion. Lungs elsewhere clear. Stable cardiac prominence. Aortic Atherosclerosis (ICD10-I70.0). Bones osteoporotic. Probable chronic rotator cuff tears bilaterally, characterized by superior migration of each humeral head. Electronically Signed   By: Lowella Grip III M.D.   On: 03/27/2020 15:04   DG Hip Port Unilat W or Wo Pelvis 1 View Right  Result Date: 03/27/2020 CLINICAL DATA:  Recent hip surgery, multiple falls EXAM: DG HIP (WITH OR WITHOUT PELVIS) 1V PORT RIGHT COMPARISON:  12/21/2018 FINDINGS: Frontal view of the pelvis as well as frontal view of the right hip are obtained. Since the prior exam, intramedullary rod with proximal dynamic and distal interlocking screw has been placed across the previously seen intertrochanteric right hip fracture. There has been  bony resorption of the femoral head, with migration of the dynamic screw now resulting in acetabular roof erosion. Residual fracture line can be seen along the remaining portion of the femoral neck consistent with nonunion. No acute bony abnormality.  Stable left hip osteoarthritis. IMPRESSION: 1. 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 Bony resorption of the majority of the right femoral head, with bony erosion of the acetabular roof by the dynamic screw. 2. Stable left hip osteoarthritis. Electronically Signed   By: Randa Ngo M.D.   On: 03/27/2020 15:29    EKG: Independently reviewed. HR 70. Sinus rhythm. QTc 462. No STEMI.  Assessment/Plan Principal Problem:   CAP (community acquired pneumonia) Active Problems:   Edema of extremities   Protein-calorie malnutrition, severe   Depression due to dementia ([place])   CKD (chronic kidney disease) stage 4, GFR 15-29 ml/min (HCC)   Diastolic dysfunction   Pleural effusion   Acute kidney injury superimposed on CKD (H[facility])   Hypoxemia   Cystitis   Acute encephalopathy  86 y.o. female with PMH of right hip fracture s/p fixation March 2020, Takotsubo cardiomyopathy, aortic stenosis and mild dementia who presented to ED with AMS and admitted for CAP and UTI.  Acute Encephalopathy - presumed secondary to acute infection (CAP and UTI) with tx as below - will obtain CT Head as well - glucose also low, repeat pending, monitor closely  - NPO currently  - consider swallow study when more awake - PT/OT when more alert  CAP Acute Respiratory Failure with hypoxia  Pleural Effusion; chronic  - noted on imaging - O2 support PRN - Cont Rocephin/Doxy for 5 days (allergy to Azithro) - f/u blood cx - Lactate WNL  Cystitis - cont Rocephin - f/u Urine cx  AKI on CKD - Baseline Cr ~ 1.3 - Cr 2.29 on admission  - s/p IVF at nursing home and in ED - suspect possibly elevated secondary to fluid overload by exam  although son did report poor PO intake this week  - Ur Cr and Ur urea ordered - will hold home Demedex for now and trend in AM - consider IV Lasix if not improving despite IVF  Diastolic Dysfunction - Grade I diastolic dysfunction on Echo 2019 - restart diuretics as able  - monitor fluid status closely - daily weights - I's and O's  Depression - holding home Wellbutrin as NPO; restart when able   Malnutrition - Nutrition consult - BMI 16 - Mag and Phos levels   Code Status: Per son: DNR/DNI. Declines chest compressions and intubation. Okay with NIV, pressors, ICU-level care.  DVT Prophylaxis: Heparin Family Communication: Spoke by telephone with patient's son [name]. If unable to contact him, please call Vaughan Basta. Disposition Plan: Admit to inpatient. Patient acutely ill and requiring IV abx and further workup for improvement. Patient is at high risk for further decompensation due to age and co-morbidities. Anticipate discharge back to nursing home in 4-5 days.    Time spent: 70 min  Chauncey Mann, MD [facility] Pager [phone]

## 2020-03-27 NOTE — Telephone Encounter (Signed)
[facility] Rx and sent to Dr. Lyndel Safe for approval.

## 2020-03-27 NOTE — ED Notes (Signed)
[name] (Son) is leaving bedside for some time. He left his cell number: [phone] and asked to be called with updates ie. if she moves to the floor.

## 2020-03-27 NOTE — Assessment & Plan Note (Addendum)
Stable, continue Pantoprazole, Famotidine. Thin liquids.

## 2020-03-27 NOTE — ED Notes (Signed)
Then pts son has left but will be back soon

## 2020-03-27 NOTE — ED Notes (Signed)
Son at bedside, talking with Dr. Langston Masker

## 2020-03-27 NOTE — ED Notes (Signed)
Microbiology has been called to add on the urine culture.

## 2020-03-27 NOTE — Assessment & Plan Note (Signed)
Lethargic, off Sertraline, continue Wellbutrin.

## 2020-03-27 NOTE — ED Notes (Signed)
The pt was taken to c-t before I could start her 2nd antibiotic

## 2020-03-27 NOTE — ED Notes (Signed)
Head of the bed elevated pt appears to be more comfortable

## 2020-03-27 NOTE — ED Provider Notes (Signed)
[facility] EMERGENCY DEPARTMENT Provider Note   CSN: [id number] Arrival date & time: 03/27/20  1420     History Chief Complaint  Patient presents with  . AMS    [name] is a 86 y.o. female with a history of right hip fracture s/p fixation March 2020, Takotsubo cardiomyopathy, hypertension, hyperlipidemia, aortic stenosis, presenting to the emergency department with altered mental status and wheezing.  Patient presents from nursing facilit.  There were concerns for decline in mental status and mobility for the past several days, with the patient now completely nonverbal and continuously wheezing.  She reportedly is not on home oxygen per EMS.  She does have a history of COPD.  Her medication list that she arrives with includes Symbicort and Torsemide 20 mg once daily.  Staff says she was no longer taking her medications due to AMS today.  The patient son at bedside provides additional history.  His name is [name].  He confirms the patient is DNR/DNI.  He reports the patient is in a nursing facility for mobility issues primarily as she fell and had a right hip fracture a year ago.  He says normally the patient is mentally sharp and intact.  He denies that she has dementia or confusion at baseline.  He reports that for the past 3 days he has seen a rapid and progressive decline in her mental status.  He states that today she was basically not speaking to him or even looking at him, which is extremely unusual.  He says that the NP at the nursing facility was concerned that the patient may be dehydrated from blood work, and they are also concerned the patient may be developing pneumonia, and that they had started her on doxycycline yesterday.  Patient herself is nonverbal on arrival and will not provide any information.  She grimaces with pain when she is being moved.  She arrives with a signed DNR/DNI form at the bedside.  HPI     Past Medical History:    Diagnosis Date  . Amputation of finger of right hand   . Aortic stenosis, mild 03/03/2016   By echo 08/2015  . Arthritis   . Back pain   . Bradycardia 08/28/2015  . CKD (chronic kidney disease) stage 3, GFR 30-59 ml/min   . Closed right hip fracture ([place]) 12/2018  . Depression   . GERD (gastroesophageal reflux disease)   . GI bleed due to NSAIDs   . Hyperlipidemia   . Hypertension   . Macular degeneration   . Maxillary sinusitis   . MVP (mitral valve prolapse) 03/03/2016   Bileaflet MVP with mild MR by echo 2016  . Old MI (myocardial infarction)    NSTEMI secondary to stress MI/Takotsubo CM, minimal nonobstructive ASCAD at cath  . Osteoporosis   . Persistent atrial fibrillation ([place]) 2006   not on anticoagulation due to GI bleed and increased fall risk  . PMR (polymyalgia rheumatica) (HCC)   . SIADH (syndrome of inappropriate ADH production) ([place])   . Takotsubo cardiomyopathy    EF normalized to 70%  . Ventricular tachycardia ([place])    on initial presentation of MI    Patient Active Problem List   Diagnosis Date Noted  . Altered mental status 03/27/2020  . Acute kidney injury superimposed on CKD ([place]) 03/27/2020  . Rash 03/25/2020  . Sunburn of second degree 02/29/2020  . Right wrist fracture 01/03/2020  . Fall 12/25/2019  . Right hip pain 11/15/2019  .  Slow transit constipation 09/10/2019  . Chronic bronchitis ([place]) 07/21/2019  . Pleural effusion 07/21/2019  . CKD (chronic kidney disease) stage 4, GFR 15-29 ml/min (HCC) 06/23/2019  . Diastolic dysfunction 80/99/8338  . Depression due to dementia ([place]) 06/06/2019  . Hallucination, visual 03/28/2019  . Weight gain 03/28/2019  . Delirium 03/04/2019  . Cognitive impairment 03/04/2019  . Anemia 02/21/2019  . Nondisplaced fracture of distal phalanx of right lesser toe(s), initial encounter for closed fracture 12/23/2018  . Closed right hip fracture ([place]) 12/21/2018  . Closed right hip fracture, initial encounter ([place])  12/21/2018  . Protein-calorie malnutrition, severe 12/21/2018  . Closed intertrochanteric fracture of hip, right, initial encounter ([place])   . Edema of extremities 04/12/2018  . Aortic stenosis, mild 03/03/2016  . MVP (mitral valve prolapse) 03/03/2016  . Bradycardia 08/28/2015  . Takotsubo cardiomyopathy   . Old MI (myocardial infarction)   . Back pain   . SIADH (syndrome of inappropriate ADH production) ([place])   . Ventricular tachycardia ([place])   . GI bleed due to NSAIDs   . Hyperlipidemia   . Persistent atrial fibrillation ([place])   . Umbilical hernia 25/02/3975  . S/P cholecystectomy 12/03/2011  . Polymyalgia rheumatica ([place]) 10/08/2011  . Essential hypertension, benign 10/08/2011  . Esophageal reflux 10/08/2011    Past Surgical History:  Procedure Laterality Date  . CARDIAC CATHETERIZATION     normal coronary arteries  . CHOLECYSTECTOMY  11/10/2011   Procedure: LAPAROSCOPIC CHOLECYSTECTOMY WITH INTRAOPERATIVE CHOLANGIOGRAM;  Surgeon: Pedro Earls, MD;  Location: [facility];  Service: General;  Laterality: N/A;  . EYE SURGERY  06/10/11   membrane peel   . INTRAMEDULLARY (IM) NAIL INTERTROCHANTERIC Right 12/21/2018   Procedure: INTRAMEDULLARY (IM) NAIL INTERTROCHANTRIC;  Surgeon: Nicholes Stairs, MD;  Location: [facility];  Service: Orthopedics;  Laterality: Right;  . NASAL SINUS SURGERY Left   . ROTATOR CUFF REPAIR  1998  . UMBILICAL HERNIA REPAIR  11/10/2011   Procedure: HERNIA REPAIR UMBILICAL ADULT;  Surgeon: Pedro Earls, MD;  Location: [facility];  Service: General;  Laterality: N/A;  . WISDOM TOOTH EXTRACTION       OB History   No obstetric history on file.     Family History  Problem Relation Age of Onset  . Cancer Mother     Social History   Tobacco Use  . Smoking status: Never Smoker  . Smokeless tobacco: Never Used  Substance Use Topics  . Alcohol use: No    Alcohol/week: 1.0 standard drinks    Types: 1 Glasses of wine per week    Comment: occassionally    . Drug use: No    Home Medications Prior to Admission medications   Medication Sig Start Date End Date Taking? Authorizing Provider  acetaminophen (TYLENOL) 325 MG tablet Take 650 mg by mouth 2 (two) times daily.     [provider]  acetaminophen (TYLENOL) 500 MG tablet Take 500 mg by mouth every 6 (six) hours as needed. Every 6hrs.    [provider]  albuterol (PROVENTIL) (2.5 MG/3ML) 0.083% nebulizer solution Take 2.5 mg by nebulization every 6 (six) hours as needed for wheezing or shortness of breath.    [provider]  Amino Acids-Protein Hydrolys (FEEDING SUPPLEMENT, PRO-STAT SUGAR FREE 64,) LIQD Take 30 mLs by mouth daily.    [provider]  amoxicillin-clavulanate (AUGMENTIN) 500-125 MG tablet Take 1 tablet by mouth every 8 (eight) hours. 03/26/20 04/02/20  [provider]  azelastine (ASTELIN) 0.1 % nasal  spray Place 2 sprays into both nostrils 2 (two) times daily as needed for allergies.     [provider]  budesonide-formoterol (SYMBICORT) 80-4.5 MCG/ACT inhaler Inhale 2 puffs into the lungs 2 (two) times daily.     [provider]  buPROPion (WELLBUTRIN SR) 150 MG 12 hr tablet Take 1 tablet (150 mg total) by mouth every morning. 12/26/18   Roney Jaffe, MD  carboxymethylcellul-glycerin (LUBRICATING EYE DROPS) 0.5-0.9 % ophthalmic solution Place 1 drop into both eyes 3 (three) times daily as needed for dry eyes. For allergies    [provider]  cetirizine (ZYRTEC) 10 MG tablet Take 10 mg by mouth daily.     [provider]  demeclocycline (DECLOMYCIN) 150 MG tablet Take 150 mg by mouth 2 (two) times daily.  08/17/11   [provider]  diclofenac sodium (VOLTAREN) 1 % GEL Apply 1 application topically 4 (four) times daily as needed (pain).     [provider]  famotidine (PEPCID) 20 MG tablet Take 20 mg by mouth daily.    [provider]  Ferrous Sulfate (IRON) 325 (65 Fe) MG  TABS Take 1 tablet by mouth daily. Give with meals    [provider]  fluticasone (FLONASE) 50 MCG/ACT nasal spray Place into both nostrils daily. 2 Sprays in both nostrils.    [provider]  memantine (NAMENDA) 5 MG tablet Take 5 mg by mouth 2 (two) times daily.     [provider]  NYSTATIN EX Apply 100,000 Units topically 2 (two) times daily. Apply to left and right breast for rash.    [provider]  ondansetron (ZOFRAN) 4 MG tablet Take 1 tablet (4 mg total) by mouth every 6 (six) hours as needed for nausea. 12/23/18   Cristal Deer, MD  pantoprazole (PROTONIX) 40 MG tablet Take 40 mg by mouth daily.    [provider]  polyethylene glycol (MIRALAX / GLYCOLAX) packet Take 17 g by mouth every other day.     [provider]  pregabalin (LYRICA) 25 MG capsule Take 25 mg by mouth 2 (two) times daily.     [provider]  torsemide (DEMADEX) 20 MG tablet Take 20 mg by mouth once.  06/26/19   [provider]  triamcinolone cream (KENALOG) 0.5 % Apply 1 application topically 2 (two) times daily.    [provider]  zinc oxide 20 % ointment Apply 1 application topically as needed for irritation. To buttocks after every incontinent episode and as needed for redness. May keep at bedside    [provider]    Allergies    Amlodipine, Avelox [moxifloxacin hcl in nacl], Biaxin [clarithromycin], Ceftin [cefuroxime axetil], Duragesic disc transdermal system [fentanyl], Forteo [parathyroid hormone (recomb)], Morphine and related, Teriparatide, and Lidocaine  Review of Systems   Review of Systems  Unable to perform ROS: Patient nonverbal (level 5 caveat, AMS)    Physical Exam Updated Vital Signs BP 116/71 (BP Location: Right Arm)   Pulse 75   Resp 14   SpO2 96%   Physical Exam Vitals and nursing note reviewed.  Constitutional:      Appearance: She is well-developed.  HENT:     Head: Normocephalic and  atraumatic.  Eyes:     Conjunctiva/sclera: Conjunctivae normal.  Cardiovascular:     Rate and Rhythm: Normal rate and regular rhythm.     Pulses: Normal pulses.  Pulmonary:     Effort: Pulmonary effort is normal.  Comments: Rales on exam, expiratory rattle, 97% on room air Abdominal:     Palpations: Abdomen is soft.     Tenderness: There is no abdominal tenderness.  Musculoskeletal:     Cervical back: Neck supple.     Comments: Edema of the lower extremities Ecchymosis of right lower extremity Pain in right hip with movement  Skin:    General: Skin is warm and dry.  Neurological:     Mental Status: She is alert.     GCS: GCS eye subscore is 3. GCS verbal subscore is 2. GCS motor subscore is 4.     ED Results / Procedures / Treatments   Labs (all labs ordered are listed, but only abnormal results are displayed) Labs Reviewed - No data to display  EKG None  Radiology No results found.  Procedures Procedures (including critical care time)  Medications Ordered in ED Medications - No data to display  ED Course  I have reviewed the triage vital signs and the nursing notes.  Pertinent labs & imaging results that were available during my care of the patient were reviewed by me and considered in my medical decision making (see chart for details).  86 yo female w/ CHF, COPD presenting with wheezing and AMS, decline in mental function over past several days.  Appears to be in some pain on exam.  Penidng xray of hip  No clear evidence of sepsis on initial exam Pending xray, labs to evaluate for pulmonary infection UA as well to look for UTI  Will need trop, ecg, BNP level to evaluate for CHF component  Son at bedside confirms DNR/DNI status but does want hospital care if needed to stabilize her.  This does sound like an acute change in her baseline status.  She is stable on room air on arrival.  Not needing bipap or supplemental O2.  Clinical Course as of Mar 28 1519   Wed Mar 27, 2020  1512 IMPRESSION: Airspace consolidation consistent with pneumonia left base with small left pleural effusion. Lungs elsewhere clear. Stable cardiac prominence.     [MT]  63 Pt signed out to Dr Colvin Caroli with plan to f/u on xray images and labs, possible PNA vs CHF, right hip pain   [MT]    Clinical Course User Index [MT] Bergen Magner, Carola Rhine, MD    Final Clinical Impression(s) / ED Diagnoses Final diagnoses:  None    Rx / DC Orders ED Discharge Orders    None       Wyvonnia Dusky, MD 03/27/20 1733

## 2020-03-28 DIAGNOSIS — J189 Pneumonia, unspecified organism: Secondary | ICD-10-CM

## 2020-03-28 DIAGNOSIS — F0280 Dementia in other diseases classified elsewhere without behavioral disturbance: Secondary | ICD-10-CM

## 2020-03-28 DIAGNOSIS — F329 Major depressive disorder, single episode, unspecified: Secondary | ICD-10-CM

## 2020-03-28 DIAGNOSIS — E222 Syndrome of inappropriate secretion of antidiuretic hormone: Secondary | ICD-10-CM

## 2020-03-28 DIAGNOSIS — N179 Acute kidney failure, unspecified: Secondary | ICD-10-CM

## 2020-03-28 DIAGNOSIS — N189 Chronic kidney disease, unspecified: Secondary | ICD-10-CM

## 2020-03-28 DIAGNOSIS — N3001 Acute cystitis with hematuria: Secondary | ICD-10-CM

## 2020-03-28 DIAGNOSIS — N184 Chronic kidney disease, stage 4 (severe): Secondary | ICD-10-CM

## 2020-03-28 DIAGNOSIS — I5033 Acute on chronic diastolic (congestive) heart failure: Secondary | ICD-10-CM | POA: Diagnosis present

## 2020-03-28 DIAGNOSIS — N3090 Cystitis, unspecified without hematuria: Secondary | ICD-10-CM

## 2020-03-28 DIAGNOSIS — L8990 Pressure ulcer of unspecified site, unspecified stage: Secondary | ICD-10-CM | POA: Insufficient documentation

## 2020-03-28 DIAGNOSIS — E43 Unspecified severe protein-calorie malnutrition: Secondary | ICD-10-CM

## 2020-03-28 LAB — GLUCOSE, CAPILLARY
Glucose-Capillary: 114 mg/dL — ABNORMAL HIGH (ref 70–99)
Glucose-Capillary: 41 mg/dL — CL (ref 70–99)
Glucose-Capillary: 41 mg/dL — CL (ref 70–99)
Glucose-Capillary: 118 mg/dL — ABNORMAL HIGH (ref 70–99)
Glucose-Capillary: 92 mg/dL (ref 70–99)
Glucose-Capillary: 119 mg/dL — ABNORMAL HIGH (ref 70–99)

## 2020-03-28 LAB — STREP PNEUMONIAE URINARY ANTIGEN: Strep Pneumo Urinary Antigen: NEGATIVE

## 2020-03-28 LAB — BASIC METABOLIC PANEL
Anion gap: 8 (ref 5–15)
BUN: 68 mg/dL — ABNORMAL HIGH (ref 8–23)
CO2: 30 mmol/L (ref 22–32)
Calcium: 8.3 mg/dL — ABNORMAL LOW (ref 8.9–10.3)
Chloride: 107 mmol/L (ref 98–111)
Creatinine, Ser: 1.73 mg/dL — ABNORMAL HIGH (ref 0.44–1.00)
GFR calc Af Amer: 29 mL/min — ABNORMAL LOW (ref >60)
GFR calc non Af Amer: 25 mL/min — ABNORMAL LOW (ref >60)
Glucose, Bld: 54 mg/dL — ABNORMAL LOW (ref 70–99)
Potassium: 4 mmol/L (ref 3.5–5.1)
Sodium: 145 mmol/L (ref 135–145)

## 2020-03-28 LAB — CBC
HCT: 33.6 % — ABNORMAL LOW (ref 36.0–46.0)
Hemoglobin: 11.1 g/dL — ABNORMAL LOW (ref 12.0–15.0)
MCH: 31.4 pg (ref 26.0–34.0)
MCHC: 33 g/dL (ref 30.0–36.0)
MCV: 94.9 fL (ref 80.0–100.0)
Platelets: 166 10*3/uL (ref 150–400)
RBC: 3.54 MIL/uL — ABNORMAL LOW (ref 3.87–5.11)
RDW: 14.6 % (ref 11.5–15.5)
WBC: 14.6 10*3/uL — ABNORMAL HIGH (ref 4.0–10.5)
nRBC: 0 % (ref 0.0–0.2)

## 2020-03-28 LAB — URINE CULTURE

## 2020-03-28 LAB — PHOSPHORUS: Phosphorus: 4.2 mg/dL (ref 2.5–4.6)

## 2020-03-28 LAB — LACTIC ACID, PLASMA: Lactic Acid, Venous: 1 mmol/L (ref 0.5–1.9)

## 2020-03-28 LAB — MAGNESIUM: Magnesium: 2.2 mg/dL (ref 1.7–2.4)

## 2020-03-28 MED ORDER — FUROSEMIDE 10 MG/ML IJ SOLN
20.0000 mg | Freq: Two times a day (BID) | INTRAMUSCULAR | Status: AC
  Administered 2020-03-28 – 2020-03-30 (×4): 20 mg via INTRAVENOUS
  Filled 2020-03-28 (×4): qty 2

## 2020-03-28 MED ORDER — CARBOXYMETHYLCELLUL-GLYCERIN 0.5-0.9 % OP SOLN
1.0000 [drp] | Freq: Three times a day (TID) | OPHTHALMIC | Status: DC | PRN
  Filled 2020-03-28: qty 15

## 2020-03-28 MED ORDER — FUROSEMIDE 10 MG/ML IJ SOLN
20.0000 mg | Freq: Once | INTRAMUSCULAR | Status: AC
  Administered 2020-03-28: 20 mg via INTRAVENOUS
  Filled 2020-03-28: qty 2

## 2020-03-28 MED ORDER — DEXTROSE 50 % IV SOLN: 25.0000 g | INTRAVENOUS | Status: AC

## 2020-03-28 MED ORDER — DM-GUAIFENESIN ER 30-600 MG PO TB12
1.0000 | ORAL_TABLET | Freq: Two times a day (BID) | ORAL | Status: DC
  Administered 2020-03-28 – 2020-04-03 (×11): 1 via ORAL
  Filled 2020-03-28 (×11): qty 1

## 2020-03-28 MED ORDER — DEXTROSE 50 % IV SOLN
INTRAVENOUS | Status: AC
  Filled 2020-03-28: qty 50

## 2020-03-28 MED ORDER — SODIUM CHLORIDE 0.9 % IV SOLN
1.0000 g | INTRAVENOUS | Status: DC
  Administered 2020-03-28 – 2020-03-30 (×3): 1 g via INTRAVENOUS
  Filled 2020-03-28 (×3): qty 1

## 2020-03-28 MED ORDER — DEXTROSE-NACL 5-0.45 % IV SOLN: INTRAVENOUS | Status: DC

## 2020-03-28 MED ORDER — PANTOPRAZOLE SODIUM 40 MG PO TBEC
40.0000 mg | DELAYED_RELEASE_TABLET | Freq: Every day | ORAL | Status: DC
  Administered 2020-03-28 – 2020-04-03 (×6): 40 mg via ORAL
  Filled 2020-03-28 (×6): qty 1

## 2020-03-28 MED ORDER — AZELASTINE HCL 0.1 % NA SOLN
2.0000 | Freq: Every day | NASAL | Status: DC
  Administered 2020-03-28 – 2020-04-03 (×7): 2 via NASAL
  Filled 2020-03-28 (×2): qty 30

## 2020-03-28 MED ORDER — LORATADINE 10 MG PO TABS
10.0000 mg | ORAL_TABLET | Freq: Every day | ORAL | Status: DC
  Administered 2020-03-29 – 2020-04-03 (×6): 10 mg via ORAL
  Filled 2020-03-28 (×7): qty 1

## 2020-03-28 MED ORDER — POLYVINYL ALCOHOL 1.4 % OP SOLN
1.0000 [drp] | Freq: Three times a day (TID) | OPHTHALMIC | Status: DC | PRN
  Filled 2020-03-28: qty 15

## 2020-03-28 MED ORDER — DEMECLOCYCLINE HCL 150 MG PO TABS
150.0000 mg | ORAL_TABLET | Freq: Two times a day (BID) | ORAL | Status: DC
  Administered 2020-03-28 – 2020-04-03 (×12): 150 mg via ORAL
  Filled 2020-03-28 (×13): qty 1

## 2020-03-28 MED ORDER — DEXTROSE 50 % IV SOLN
INTRAVENOUS | Status: AC
  Administered 2020-03-28: 50 mL
  Filled 2020-03-28: qty 50

## 2020-03-28 MED ORDER — DEXTROSE 50 % IV SOLN
25.0000 g | INTRAVENOUS | Status: AC
  Administered 2020-03-28: 25 g via INTRAVENOUS

## 2020-03-28 MED ORDER — FLUTICASONE PROPIONATE 50 MCG/ACT NA SUSP
2.0000 | Freq: Every day | NASAL | Status: DC
  Administered 2020-03-28 – 2020-04-03 (×7): 2 via NASAL
  Filled 2020-03-28 (×2): qty 16

## 2020-03-28 NOTE — Progress Notes (Signed)
Pharmacy Antibiotic Note  [name] is a 86 y.o. female admitted on 03/27/2020 with HCAP and UTI.  Pharmacy has been consulted for cefepime dosing.  Ceftriaxone administered 03/27/20 2141 with no reaction/intolerance noted. Other active antibiotics include doxycycline 100mg  IV q12h  Today, patient is afebrile and WBC elevated.     Plan: Cefepime 1 g IV q 24 h Monitor clinical progress and renal function F/U C&S, abx deescalation / LOT    Height: 4\' 6"  (137.2 cm) Weight: 61.1 kg (134 lb 11.2 oz) (obtained via bedscale) IBW/kg (Calculated) : 31.7  Temp (24hrs), Avg:97.8 F (36.6 C), Min:96.2 F (35.7 C), Max:98.7 F (37.1 C)  Recent Labs  Lab 03/27/20 0000 03/27/20 1436 03/27/20 1929 03/28/20 0140  WBC 9.2 15.3*  --  14.6*  CREATININE 3.0* 2.29*  --  1.73*  LATICACIDVEN  --   --  0.9 1.0    Estimated Creatinine Clearance: 13.4 mL/min (A) (by C-G formula based on SCr of 1.73 mg/dL (H)).    Allergies  Allergen Reactions  . Amlodipine Swelling    To feet and ankles   . Avelox [Moxifloxacin Hcl In Nacl] Other (See Comments)    Pt does not remember   . Biaxin [Clarithromycin]   . Ceftin [Cefuroxime Axetil]   . Duragesic Disc Transdermal System [Fentanyl] Other (See Comments)    Reaction unknown  . Forteo [Parathyroid Hormone (Recomb)] Other (See Comments)    Made skin dry  . Morphine And Related Other (See Comments)    "My Sister and daughter can,t take it. I've had it before without problems."  . Teriparatide   . Lidocaine Rash    Antimicrobials this admission: 6/9 ceftriaxone x 1 6/9 doxycycline >>  6/10 cefepime>>  Dose adjustments this admission: n/a  Microbiology results: 6/9 BCx: pending 6/9 UCx: sent    Thank you for allowing pharmacy to be a part of this patient's care.  Efraim Kaufmann, PharmD, BCPS 03/28/2020 9:00 AM

## 2020-03-28 NOTE — Evaluation (Signed)
Physical Therapy Evaluation Patient Details Name: [name] MRN: [id number] DOB: May 20, 1923 Today's Date: 03/28/2020   History of Present Illness  This 86 y.o. female admitted with AMS.  CXR consistent with PNA.  Also found to have UTI.  PMH includes:  hip fx with ORIF 12/2018, takotsubo cardiomyopathy, aortic stenosis, and mild dementia, macular degeneration, persistent A-Fib, osteoporosis, h/o MI, bradycardia, s/p amputation of finger Rt hand, ventricular tachycardia   Clinical Impression  Pt admitted secondary to problem above with deficits below. Pt extremely HOH, however, with increased alertness from OT session and was able to follow simple commands to assist with rolling for clean up. Required max A to roll from side to side. Increased rattle sounds noted with breathing, however, oxygen sats >90% on 2L. RN aware. PLOF gained from OT noted, however, pt was able to ambulate short distances with assist from PT. Recommend return to SNF at d/c. Will continue to follow acutely to maximize functional mobility independence and safety.     Follow Up Recommendations SNF    Equipment Recommendations  None recommended by PT    Recommendations for Other Services       Precautions / Restrictions Precautions Precautions: Fall Restrictions Weight Bearing Restrictions: No      Mobility  Bed Mobility Overal bed mobility: Needs Assistance Bed Mobility: Rolling Rolling: Max assist         General bed mobility comments: Max A for assist with rolling X2-3 from side to side for clean up as pt had had BM and wet the bed. Pt able to assist some with use of bed rail. Pt with increased fatigue and very rattled sounding breathing (oxygen WFL), so further mobility deferred.   Transfers                    Ambulation/Gait                Stairs            Wheelchair Mobility    Modified Rankin (Stroke Patients Only)       Balance                                              Pertinent Vitals/Pain Pain Assessment: Faces Faces Pain Scale: Hurts little more Pain Location: bil. LEs when moving them  Pain Descriptors / Indicators: Grimacing Pain Intervention(s): Monitored during session;Limited activity within patient's tolerance;Repositioned    Home Living Family/patient expects to be discharged to:: Skilled nursing facility                 Additional Comments: Pt resides at SNF [facility]     Prior Function Level of Independence: Needs assistance   Gait / Transfers Assistance Needed: Pt was able to ambulate with RW short distances with PT and chair following   ADL's / Homemaking Assistance Needed: Required assist with ADLs.  She was able to self feed, and perform UB ADLs with set up/supervision         Hand Dominance   Dominant Hand: Right    Extremity/Trunk Assessment   Upper Extremity Assessment Upper Extremity Assessment: Defer to OT evaluation    Lower Extremity Assessment Lower Extremity Assessment: Generalized weakness (increased pain in LEs with movement )    Cervical / Trunk Assessment Cervical / Trunk Assessment: Kyphotic  Communication   Communication: HOH;Expressive  difficulties (severely HOH per daughter.  Hearing aids lost )  Cognition Arousal/Alertness: Awake/alert Behavior During Therapy: Flat affect Overall Cognitive Status: Impaired/Different from baseline Area of Impairment: Attention;Following commands                   Current Attention Level: Focused   Following Commands: Follows one step commands with increased time       General Comments: Pt with increased alertness from OT session. Was able to follow simple commands to assist with rolling. Very HOH and no family present.       General Comments General comments (skin integrity, edema, etc.): No family present. Oxygen sats  >90% on 2L.     Exercises     Assessment/Plan    PT Assessment Patient needs  continued PT services  PT Problem List Decreased strength;Decreased balance;Decreased activity tolerance;Decreased mobility;Decreased cognition;Decreased knowledge of use of DME;Decreased knowledge of precautions;Decreased safety awareness;Cardiopulmonary status limiting activity       PT Treatment Interventions DME instruction;Gait training;Functional mobility training;Therapeutic activities;Therapeutic exercise;Balance training;Patient/family education;Cognitive remediation    PT Goals (Current goals can be found in the Care Plan section)  Acute Rehab PT Goals PT Goal Formulation: Patient unable to participate in goal setting Time For Goal Achievement: 04/11/20 Potential to Achieve Goals: Fair    Frequency Min 2X/week   Barriers to discharge        Co-evaluation               AM-PAC PT "6 Clicks" Mobility  Outcome Measure Help needed turning from your back to your side while in a flat bed without using bedrails?: Total Help needed moving from lying on your back to sitting on the side of a flat bed without using bedrails?: Total Help needed moving to and from a bed to a chair (including a wheelchair)?: Total Help needed standing up from a chair using your arms (e.g., wheelchair or bedside chair)?: Total Help needed to walk in hospital room?: Total Help needed climbing 3-5 steps with a railing? : Total 6 Click Score: 6    End of Session   Activity Tolerance: Patient limited by fatigue Patient left: in bed;with call bell/phone within reach;with bed alarm set;with nursing/sitter in room Nurse Communication: Mobility status;Other (comment) (pt with rattled breathing) PT Visit Diagnosis: Other abnormalities of gait and mobility (R26.89);Unsteadiness on feet (R26.81);Muscle weakness (generalized) (M62.81);Difficulty in walking, not elsewhere classified (R26.2)    Time: 6759-1638 PT Time Calculation (min) (ACUTE ONLY): 23 min   Charges:   PT Evaluation $PT Eval Moderate  Complexity: 1 Mod PT Treatments $Therapeutic Activity: 8-22 mins        Lou Miner, DPT  Acute Rehabilitation Services  Pager: [phone] Office: [phone]   Rudean Hitt 03/28/2020, 6:23 PM

## 2020-03-28 NOTE — Evaluation (Signed)
Occupational Therapy Evaluation Patient Details Name: [name] MRN: [id number] DOB: [birth year] Today's Date: 03/28/2020    History of Present Illness This 86 y.o. female admitted with AMS.  CXR consistent with PNA.  Also found to have UTI.  PMH includes:  hip fx with ORIF 12/2018, takotsubo cardiomyopathy, aortic stenosis, and mild dementia, macular degeneration, persistent A-Fib, osteoporosis, h/o MI, bradycardia, s/p amputation of finger Rt hand, ventricular tachycardia    Clinical Impression   Pt admitted with above. She demonstrates the below listed deficits and will benefit from continued OT to maximize safety and independence with BADLs.  Pt presents to OT with significant lethargy.  She is severely HOH and low vision.  She did not follow any commands, and required total A for bed mobility.  She was able to sit EOB with max A progressing to min A for short period before fatiguing.  She requires total A for all aspects of ADLs.  She resides at [facility] and was undergoing therapies and progressing well with PT/OT.  Recommend return to SNF at discharge.       Follow Up Recommendations  SNF    Equipment Recommendations  None recommended by OT    Recommendations for Other Services       Precautions / Restrictions Precautions Precautions: Fall Restrictions Weight Bearing Restrictions: No      Mobility Bed Mobility Overal bed mobility: Needs Assistance Bed Mobility: Sit to Supine;Supine to Sit     Supine to sit: Total assist Sit to supine: Total assist   General bed mobility comments: requires assist for all aspects of mobility.  Pt did not attempt to assist   Transfers                 General transfer comment: Pt unable to safely attempt to stand     Balance Overall balance assessment: Needs assistance Sitting-balance support: Feet supported Sitting balance-Leahy Scale: Poor Sitting balance - Comments: Pt initially with heavy posterior lean requiring  max A for EOB sitting, but progressed to periods of min A  Postural control: Posterior lean                                 ADL either performed or assessed with clinical judgement   ADL Overall ADL's : Needs assistance/impaired Eating/Feeding: Total assistance   Grooming: Wash/dry hands;Oral care;Wash/dry face;Total assistance;Bed level   Upper Body Bathing: Total assistance;Bed level   Lower Body Bathing: Total assistance;Bed level   Upper Body Dressing : Total assistance;Bed level   Lower Body Dressing: Total assistance;Bed level   Toilet Transfer: Total assistance   Toileting- Clothing Manipulation and Hygiene: Total assistance;Bed level       Functional mobility during ADLs: Total assistance (supine  > EOB only )       Vision Baseline Vision/History: Macular Degeneration Patient Visual Report: Central vision impairment Additional Comments: daughter reports pt with very minimal vision due to macular degeneration      Perception     Praxis Praxis Praxis tested?: Not tested    Pertinent Vitals/Pain Pain Assessment: Faces Faces Pain Scale: Hurts little more Pain Location: bil. LEs when moving them  Pain Descriptors / Indicators: Grimacing Pain Intervention(s): Monitored during session;Limited activity within patient's tolerance     Hand Dominance Right   Extremity/Trunk Assessment Upper Extremity Assessment Upper Extremity Assessment: Generalized weakness;RUE deficits/detail RUE Deficits / Details: pt with very little active movement bil. UEs during  activity  - difficult to accurately assess function due to poor attention and lethargy    Lower Extremity Assessment Lower Extremity Assessment: Defer to PT evaluation   Cervical / Trunk Assessment Cervical / Trunk Assessment: Kyphotic   Communication Communication Communication: HOH (severely HOH per daughter.  Hearing aids lost )   Cognition Arousal/Alertness: Lethargic Behavior During  Therapy: Flat affect Overall Cognitive Status: Impaired/Different from baseline Area of Impairment: Attention;Following commands                   Current Attention Level: Focused           General Comments: does not follow commands    General Comments  daughter present.  Pt maintaining 02 sats >90% on 2L supplemental 02    Exercises     Shoulder Instructions      Home Living Family/patient expects to be discharged to:: Skilled nursing facility                                 Additional Comments: Pt resides [facility] SNF [facility]       Prior Functioning/Environment Level of Independence: Needs assistance  Gait / Transfers Assistance Needed: Pt was able to ambulate with RW short distances with PT and chair following  ADL's / Homemaking Assistance Needed: Required assist with ADLs.  She was able to self feed, and perform UB ADLs with set up/supervision             OT Problem List: Decreased strength;Decreased activity tolerance;Impaired balance (sitting and/or standing);Impaired vision/perception;Decreased coordination;Decreased cognition;Decreased safety awareness;Decreased knowledge of use of DME or AE;Cardiopulmonary status limiting activity;Impaired UE functional use      OT Treatment/Interventions: Self-care/ADL training;Therapeutic exercise;Energy conservation;DME and/or AE instruction;Therapeutic activities;Cognitive remediation/compensation;Visual/perceptual remediation/compensation;Patient/family education;Balance training    OT Goals(Current goals can be found in the care plan section) Acute Rehab OT Goals Patient Stated Goal: daughter hopes that pt will be able to ambulate and assist with her self care  OT Goal Formulation: With family Time For Goal Achievement: 04/11/20 Potential to Achieve Goals: Good ADL Goals Pt Will Perform Eating: (P) with min assist;sitting Pt Will Perform Grooming: (P) with min assist;sitting Pt Will Transfer to  Toilet: (P) with mod assist;stand pivot transfer;bedside commode Pt Will Perform Toileting - Clothing Manipulation and hygiene: (P) with max assist;sit to/from stand  OT Frequency: Min 2X/week   Barriers to D/C:            Co-evaluation              AM-PAC OT "6 Clicks" Daily Activity     Outcome Measure Help from another person eating meals?: Total Help from another person taking care of personal grooming?: Total Help from another person toileting, which includes using toliet, bedpan, or urinal?: Total Help from another person bathing (including washing, rinsing, drying)?: Total Help from another person to put on and taking off regular upper body clothing?: Total Help from another person to put on and taking off regular lower body clothing?: Total 6 Click Score: 6   End of Session Equipment Utilized During Treatment: Oxygen  Activity Tolerance: Patient limited by lethargy Patient left: in bed;with call bell/phone within reach;with bed alarm set;with family/visitor present  OT Visit Diagnosis: Unsteadiness on feet (R26.81);Cognitive communication deficit (R41.841)                Time: 8676-7209 OT Time Calculation (min): 26 min Charges:  OT General Charges $OT Visit: 1 Visit OT Evaluation $OT Eval Moderate Complexity: 1 Mod OT Treatments $Therapeutic Activity: 8-22 mins  Nilsa Nutting., OTR/L Acute Rehabilitation Services Pager [phone] Office [phone]   Lucille Passy M 03/28/2020, 2:46 PM

## 2020-03-28 NOTE — Progress Notes (Signed)
Initial Nutrition Assessment  DOCUMENTATION CODES:   Severe malnutrition in context of chronic illness  INTERVENTION:   -RD will follow for diet advancement and add supplements as appropriate  NUTRITION DIAGNOSIS:   Severe Malnutrition related to chronic illness (dementia) as evidenced by moderate fat depletion, severe fat depletion, moderate muscle depletion, severe muscle depletion.  GOAL:   Patient will meet greater than or equal to 90% of their needs  MONITOR:   PO intake, Supplement acceptance, Diet advancement, Labs, Weight trends, Skin, I & O's  REASON FOR ASSESSMENT:   Consult Assessment of nutrition requirement/status  ASSESSMENT:   86 y.o. female with PMH of right hip fracture s/p fixation March 2020, Takotsubo cardiomyopathy, aortic stenosis and mild dementia who presented to ED with AMS and admitted for CAP and UTI.  Pt admitted with acute encephalopathy (likely secondary to UTI) and CAP.   Reviewed I/O's: +150 ml x 24 hours  UOP: 400 ml x 24 hours  Pt lying in bed at time of visit; minimally interactive with this RD but did turn her head when name was called and RD was speaking to her. No family at bedside to provide additional history.   Reviewed records from [facility] SNF; pt was receiving 30 ml Prostat daily PTA.   Given pt's mentation, doubtful that pt would be able to consume po's safely at this time.   Reviewed wt hx; wt has been stable over the past 3 months. Suspect admission wt was entered in error, as RD verified wt on bedscale. Per H&P, pt has experienced a functional decline since hip fracture in March 2020.   Medications reviewed and include dextrose 5%-0.45% sodium chloride infusion @ 50 ml/hr.   Labs reviewed: K, Mg, and Phos WDL. CBGS: 35-115.   NUTRITION - FOCUSED PHYSICAL EXAM:    Most Recent Value  Orbital Region Severe depletion  Upper Arm Region Severe depletion  Thoracic and Lumbar Region Moderate depletion  Buccal Region  Moderate depletion  Temple Region Severe depletion  Clavicle Bone Region Moderate depletion  Clavicle and Acromion Bone Region Moderate depletion  Scapular Bone Region Moderate depletion  Dorsal Hand Severe depletion  Patellar Region Severe depletion  Anterior Thigh Region Severe depletion  Posterior Calf Region Severe depletion  Edema (RD Assessment) None  Hair Reviewed  Eyes Reviewed  Mouth Reviewed  Skin Reviewed  Nails Reviewed       Diet Order:   Diet Order            Diet NPO time specified Except for: Ice Chips, Sips with Meds  Diet effective now                 EDUCATION NEEDS:   Not appropriate for education at this time  Skin:  Skin Assessment: Skin Integrity Issues: Skin Integrity Issues:: Stage I Stage I: coccyx  Last BM:  Unknown  Height:   Ht Readings from Last 1 Encounters:  03/27/20 4\' 6"  (1.372 m)    Weight:   Wt Readings from Last 1 Encounters:  03/28/20 61.1 kg   BMI:  Body mass index is 32.48 kg/m.  Estimated Nutritional Needs:   Kcal:  1700-1900  Protein:  80-95 grams  Fluid:  > 1.7 L    Loistine Chance, RD, LDN, [place] Registered Dietitian II Certified Diabetes Care and Education Specialist Please refer to [facility] for RD and/or RD on-call/weekend/after hours pager

## 2020-03-28 NOTE — TOC Initial Note (Signed)
Transition of Care [facility]) - Initial/Assessment Note    Patient Details  Name: [name] MRN: [id number] Date of Birth: July 26, 1923  [facility] of [facility] CM/SW Contact:    Alexander Mt, LCSW Phone Number: 03/28/2020, 10:59 AM  Clinical Narrative:                 CSW spoke with pt daughter Vaughan Basta via telephone [phone]). Introduced self, role, reason for call.   Pt from [facility], she has been residing in the SNF area of the [facility] and getting therapy. Pt daughter states she has been progressing well with that. Plan will be return to SNF when medically ready. Deferred all medical questions to pt RN and MD teams.  Pt daughter on her way to hospital to be with pt this morning.   CSW has called and left message for Yates Decamp at [facility]. Await return call.    Expected Discharge Plan: Skilled Nursing Facility Barriers to Discharge: Continued Medical Work up, Ship broker   Patient Goals and CMS Choice Patient states their goals for this hospitalization and ongoing recovery are:: return to [facility] when ready   Choice offered to / list presented to : Adult Children  Expected Discharge Plan and Services Expected Discharge Plan: [place] In-house Referral: Clinical Social Work Discharge Planning Services: CM Consult Post Acute Care Choice: Resumption of Svcs/PTA Provider, [place] arrangements for the past 2 months: [place]  Prior Living Arrangements/Services Living arrangements for the past 2 months: [place] Lives with:: Facility Resident Patient language and need for interpreter reviewed:: Yes (no needs) Do you feel safe going back to the place where you live?: Yes      Need for Family Participation in Patient Care: Yes (Comment) (assistance w/ daily cares) Care giver support system in place?: Yes (comment) (facility staff; family support) Current home  services: DME Criminal Activity/Legal Involvement Pertinent to Current Situation/Hospitalization: No - Comment as needed  Activities of Daily Living      Permission Sought/Granted Permission sought to share information with : Family Supports, Chartered certified accountant granted to share information with : No (pt with fluctuating orientation)  Share Information with NAME: Peri [place]  Permission granted to share info w AGENCY: [place] granted to share info w Relationship: daughter  Permission granted to share info w Contact Information: [phone]  Emotional Assessment Appearance:: Other (Comment Required (telephonic assessment completed w/ daughter) Attitude/Demeanor/Rapport: Other (comment) (telephonic assessment completed w/ daughter) Affect (typically observed): Other (comment) (telephonic assessment completed w/ daughter) Orientation: : Fluctuating Orientation (Suspected and/or reported Sundowners) Alcohol / Substance Use: Not Applicable Psych Involvement:  (n/a)  Admission diagnosis:  CAP (community acquired pneumonia) [J18.9] Acute cystitis with hematuria [N30.01] Community acquired pneumonia of left lower lobe of lung [J18.9] Patient Active Problem List   Diagnosis Date Noted  . Altered mental status 03/27/2020  . Acute kidney injury superimposed on CKD ([place]) 03/27/2020  . Hypoxemia 03/27/2020  . CAP (community acquired pneumonia) 03/27/2020  . Cystitis 03/27/2020  . Acute encephalopathy 03/27/2020  . Rash 03/25/2020  . Sunburn of second degree 02/29/2020  . Right wrist fracture 01/03/2020  . Fall 12/25/2019  . Right hip pain 11/15/2019  . Slow transit constipation 09/10/2019  . Chronic bronchitis ([place]) 07/21/2019  . Pleural effusion 07/21/2019  . CKD (chronic kidney disease) stage 4, GFR 15-29 ml/min (HCC) 06/23/2019  . Diastolic dysfunction 65/12/5463  . Depression due to dementia (  [place]) 06/06/2019  . Hallucination,  visual 03/28/2019  . Weight gain 03/28/2019  . Delirium 03/04/2019  . Cognitive impairment 03/04/2019  . Anemia 02/21/2019  . Nondisplaced fracture of distal phalanx of right lesser toe(s), initial encounter for closed fracture 12/23/2018  . Closed right hip fracture ([place]) 12/21/2018  . Closed right hip fracture, initial encounter ([place]) 12/21/2018  . Protein-calorie malnutrition, severe 12/21/2018  . Closed intertrochanteric fracture of hip, right, initial encounter ([place])   . Edema of extremities 04/12/2018  . Aortic stenosis, mild 03/03/2016  . MVP (mitral valve prolapse) 03/03/2016  . Bradycardia 08/28/2015  . Takotsubo cardiomyopathy   . Old MI (myocardial infarction)   . Back pain   . SIADH (syndrome of inappropriate ADH production) ([place])   . Ventricular tachycardia ([place])   . GI bleed due to NSAIDs   . Hyperlipidemia   . Persistent atrial fibrillation ([place])   . Umbilical hernia 69/67/8938  . S/P cholecystectomy 12/03/2011  . Polymyalgia rheumatica ([place]) 10/08/2011  . Essential hypertension, benign 10/08/2011  . Esophageal reflux 10/08/2011   PCP:  [name], MD Pharmacy:   [id number] [place], [place] [address] Phone: [phone] Fax: [place], [place] [address] [place] [address] Phone: [phone] Fax: [phone]   Readmission Risk Interventions Readmission Risk Prevention Plan 03/28/2020  Transportation Screening Complete  PCP or Specialist Appt within 5-7 Days Complete  Home Care Screening Not Complete  Home Care Screening Not Completed Comments SNF resident  Medication Review (RN CM) Referral to Pharmacy  Some recent data might be hidden

## 2020-03-28 NOTE — NC FL2 (Signed)
[name] LEVEL OF CARE SCREENING TOOL     IDENTIFICATION  Patient Name: ROSALEIGH Everett Birthdate: 09/30/1923 Sex: female Admission Date (Current Location): 03/27/2020  [facility] and [place] Number:  Herbalist and Address:  [facility] [place], [employer] [address] [place], [place] [address]      Provider Number: [id number]  Attending Physician Name and Address:  Eugenie Filler, MD  Relative Name and Phone Number:       Current Level of Care: Hospital Recommended Level of Care: [place] Prior Approval Number:    Date Approved/Denied:   PASRR Number: [id number]  Discharge Plan: SNF    Current Diagnoses: Patient Active Problem List   Diagnosis Date Noted  . Altered mental status 03/27/2020  . Acute kidney injury superimposed on CKD ([place]) 03/27/2020  . Hypoxemia 03/27/2020  . CAP (community acquired pneumonia) 03/27/2020  . Cystitis 03/27/2020  . Acute encephalopathy 03/27/2020  . Rash 03/25/2020  . Sunburn of second degree 02/29/2020  . Right wrist fracture 01/03/2020  . Fall 12/25/2019  . Right hip pain 11/15/2019  . Slow transit constipation 09/10/2019  . Chronic bronchitis ([place]) 07/21/2019  . Pleural effusion 07/21/2019  . CKD (chronic kidney disease) stage 4, GFR 15-29 ml/min (HCC) 06/23/2019  . Diastolic dysfunction 17/79/3903  . Depression due to dementia ([place]) 06/06/2019  . Hallucination, visual 03/28/2019  . Weight gain 03/28/2019  . Delirium 03/04/2019  . Cognitive impairment 03/04/2019  . Anemia 02/21/2019  . Nondisplaced fracture of distal phalanx of right lesser toe(s), initial encounter for closed fracture 12/23/2018  . Closed right hip fracture ([place]) 12/21/2018  . Closed right hip fracture, initial encounter ([place]) 12/21/2018  . Protein-calorie malnutrition, severe 12/21/2018  . Closed intertrochanteric fracture of hip, right, initial encounter ([place])   . Edema of extremities 04/12/2018   . Aortic stenosis, mild 03/03/2016  . MVP (mitral valve prolapse) 03/03/2016  . Bradycardia 08/28/2015  . Takotsubo cardiomyopathy   . Old MI (myocardial infarction)   . Back pain   . SIADH (syndrome of inappropriate ADH production) ([place])   . Ventricular tachycardia ([place])   . GI bleed due to NSAIDs   . Hyperlipidemia   . Persistent atrial fibrillation ([place])   . Umbilical hernia 00/92/3300  . S/P cholecystectomy 12/03/2011  . Polymyalgia rheumatica ([place]) 10/08/2011  . Essential hypertension, benign 10/08/2011  . Esophageal reflux 10/08/2011    Orientation RESPIRATION BLADDER Height & Weight     Self  O2 (2L nasal canula) Incontinent Weight: 134 lb 11.2 oz (61.1 kg) (obtained via bedscale) Height:  4\' 6"  (137.2 cm)  BEHAVIORAL SYMPTOMS/MOOD NEUROLOGICAL BOWEL NUTRITION STATUS      Continent Diet (see discharge summary)  AMBULATORY STATUS COMMUNICATION OF NEEDS Skin   Extensive Assist Non-Verbally Skin abrasions, PU Stage and Appropriate Care (abrasion on leg; stage 1 on coccyx with foam) PU Stage 1 Dressing:  (foam dressing on coccyx)                     Personal Care Assistance Level of Assistance  Bathing, Feeding, Dressing Bathing Assistance: Maximum assistance Feeding assistance: Maximum assistance Dressing Assistance: Maximum assistance     Functional Limitations Info  Sight, Hearing, Speech Sight Info: Adequate Hearing Info: Adequate Speech Info: Impaired    SPECIAL CARE FACTORS FREQUENCY  PT (By licensed PT), OT (By licensed OT)     PT Frequency: 5x week OT Frequency: 5x week  Contractures Contractures Info: Not present    Additional Factors Info  Code Status, Allergies Code Status Info: DNR Allergies Info: Amlodipine, Avelox (Moxifloxacin Hcl In Nacl), Biaxin (Clarithromycin), Ceftin (Cefuroxime Axetil), Duragesic Disc Transdermal System (Fentanyl), Forteo (Parathyroid Hormone (Recomb)), Morphine And Related, Teriparatide, Lidocaine            Current Medications (03/28/2020):  This is the current hospital active medication list Current Facility-Administered Medications  Medication Dose Route Frequency Provider Last Rate Last Admin  . albuterol (PROVENTIL) (2.5 MG/3ML) 0.083% nebulizer solution 2.5 mg  2.5 mg Inhalation Q4H PRN Fair, Marin Shutter, MD      . azelastine (ASTELIN) 0.1 % nasal spray 2 spray  2 spray Each Nare Daily Eugenie Filler, MD      . carboxymethylcellul-glycerin (REFRESH OPTIVE) 0.5-0.9 % ophthalmic solution 1 drop  1 drop Both Eyes TID PRN Eugenie Filler, MD      . ceFEPIme (MAXIPIME) 1 g in sodium chloride 0.9 % 100 mL IVPB  1 g Intravenous Q24H Efraim Kaufmann, RPH      . dextrose 5 %-0.45 % sodium chloride infusion   Intravenous Continuous Eugenie Filler, MD 75 mL/hr at 03/28/20 1104 New Bag at 03/28/20 1104  . doxycycline (VIBRAMYCIN) 100 mg in sodium chloride 0.9 % 250 mL IVPB  100 mg Intravenous Q12H Chauncey Mann, MD 125 mL/hr at 03/28/20 0848 100 mg at 03/28/20 0848  . fluticasone (FLONASE) 50 MCG/ACT nasal spray 2 spray  2 spray Each Nare Daily Eugenie Filler, MD      . fluticasone furoate-vilanterol (BREO ELLIPTA) 100-25 MCG/INH 1 puff  1 puff Inhalation Daily Fair, Marin Shutter, MD   1 puff at 03/28/20 0750  . heparin injection 5,000 Units  5,000 Units Subcutaneous Q8H Chauncey Mann, MD   5,000 Units at 03/28/20 0618  . loratadine (CLARITIN) tablet 10 mg  10 mg Oral Daily Eugenie Filler, MD      . pantoprazole (PROTONIX) EC tablet 40 mg  40 mg Oral Q0600 Eugenie Filler, MD         Discharge Medications: Please see discharge summary for a list of discharge medications.  Relevant Imaging Results:  Relevant Lab Results:   Additional Information SSN: [id number]  [place]

## 2020-03-28 NOTE — Progress Notes (Signed)
PROGRESS NOTE    [name]  PPI:[id number] DOB: 01/03/1923 DOA: 03/27/2020 PCP: [name], MD    Chief Complaint  Patient presents with   AMS    Brief Narrative:  HPI per Dr. Ina Kick is a 86 y.o. female with PMH of right hip fracture s/p fixation March 2020,Takotsubo cardiomyopathy, aortic stenosis and mild dementia who presented to ED with AMS and admitted for CAP and UTI.  Patient non-verbal currently. All history provided by patient's son. [name], [name], reports that patient lives in a nursing home after her physical decline following hip fracture. At baseline, she walks with a walker and converses normally. Reports only very mild dementia that is usually not noticeable. He went to visit her on Sunday and noticed that she was not quite as mentally clear and her speech was a little different. His sister saw her on Monday and noted similar findings. Yesterday they obtained imaging which was c/w PNA and patient was started on Doxycyline. [name] went to see her today and noticed a marked decline. She was completely disoriented, not speaking and not walking. The NP at the nursing home obtained labs and was concerned for dehydration and gave IVF. They then recommended she come to the ER.   In the ED: Vitals stable initially on room air and then RN placed on 1.5 L O2 after desat to 88%. Labs remarkable for COVID neg, glucose 56, Cr 2.29, WBC 15.3, BNP 244, Trop 10>12, UA with large leuks and > 50 WBCs. Lactate 0.9.   CXR: Airspace consolidation consistent with pneumonia left base with small left pleural effusion. Lungs elsewhere clear. Stable cardiac Prominence.  XR Hip: 1. 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 Bony resorption of the majority of the right femoral head, with bony erosion of the acetabular roof by the dynamic screw. 2. Stable left hip osteoarthritis.  Patient was given IVF, Doxy and  Rocephin and called for admission.   Assessment & Plan:   Principal Problem:   Acute encephalopathy Active Problems:   SIADH (syndrome of inappropriate ADH production) (HCC)   Edema of extremities   Protein-calorie malnutrition, severe   Depression due to dementia (HCC)   CKD (chronic kidney disease) stage 4, GFR 15-29 ml/min (HCC)   Diastolic dysfunction   Pleural effusion   Acute kidney injury superimposed on CKD (HCC)   Hypoxemia   CAP (community acquired pneumonia)   Cystitis   Pressure injury of skin   Acute on chronic diastolic CHF (congestive heart failure) ([place])   Acute cystitis with hematuria   HCAP (healthcare-associated pneumonia)  1 acute toxic metabolic encephalopathy Likely multifactorial secondary to healthcare associated pneumonia (from SNF), UTI, episodes of hypoglycemia.  Patient noted to have 2 episodes of hypoglycemia with CBGs of 41.  CT head done negative for any acute abnormalities.  Will place patient on a clear liquid diet.  Speech therapy ordered.  Urine Legionella antigen pending.  Urine pneumococcus antigen pending.  Discontinue IV Rocephin and placed on IV cefepime as patient from a nursing home.  Continue IV doxycycline.  Change D5 normal saline to 10 cc/h as patient looks volume overloaded on examination.  Follow.  2.  HCAP/acute respiratory failure with hypoxia/chronic pleural effusion Noted on imaging.  Patient noted to have an allergy to azithromycin.  Urine Legionella antigen pending.  Urine pneumococcus antigen pending.  Blood cultures pending.  Lactate within normal limits.  As patient from a nursing facility will discontinue  IV Rocephin and placed on IV cefepime.  Continue IV doxycycline.  Place on Mucinex, Flonase, Claritin, PPI.  Follow.  3.  Acute renal failure on chronic kidney disease stage 4 Baseline creatinine 1.3.  Creatinine on admission was 2.29 on admission.  Patient noted to have received IV fluids in the nursing home and in the ED.   Patient volume overloaded on examination.  Patient noted to have poor oral intake.  And currently at 1.73.  Continue to hold home regimen Demadex.  Place on Lasix 20 mg IV daily.  Strict I's and O's.  Daily weights.  Follow.  4.  UTI Urine cultures pending.  Will change IV Rocephin to IV cefepime due to concerns for HCAP.  Follow.  5.  Acute on chronic diastolic heart failure Patient noted to have a grade 1 diastolic dysfunction per 2D echo 2019.  Patient volume overloaded on examination.  Cardiac enzymes negative.  BNP at 244.9 on admission.  Check a 2D echo.  Place on Lasix 20 mg IV every 12 hours.  Strict I's and O's.  Daily weights.  Follow.  6.  Depression Continue to hold Wellbutrin until improvement with oral intake and tolerating a diet.  7.  Severe protein calorie malnutrition Nutrition consulted.  Magnesium at 2.2.  Phosphorus at 4.2.  Follow.  8.  Stage I pressure injury coccyx, POA Continue current wound care.  9.  SIADH Resume home regimen demeclocycline.    DVT prophylaxis: Heparin Code Status: DNR Family Communication: Updated daughter at bedside. Disposition:   Status is: Inpatient    Dispo: The patient is from: SNF              Anticipated d/c is to: Likely back to SNF              Anticipated d/c date is: 3 to 4 days.              Patient currently on IV antibiotics, and acute metabolic encephalopathy, volume overloaded on examination, hypoglycemic.  Currently not stable for discharge.       Consultants:   None  Procedures:   CT head without contrast 03/27/2020  Chest x-ray 03/27/2020  Plain films of right hip and pelvis 03/27/2020  Antimicrobials:   Cefepime 03/28/2020  IV doxycycline 03/27/2020  IV Rocephin 03/27/2020>>:>>> 03/28/2020   Subjective: Patient somewhat drowsy.  Looking and tracking but not speaking.  Daughter at bedside states patient does not have a hearing aids.  Patient noted to have low blood glucose levels of 41 on 2 occasions  this morning.  Objective: Vitals:   03/28/20 0750 03/28/20 0856 03/28/20 1010 03/28/20 1420  BP:   (!) 113/55 (!) 115/55  Pulse: 68  68 68  Resp: 18  18 17   Temp:   97.7 F (36.5 C) 98.5 F (36.9 C)  TempSrc:   Axillary Oral  SpO2: 98%  99% 99%  Weight:  61.1 kg    Height:        Intake/Output Summary (Last 24 hours) at 03/28/2020 1706 Last data filed at 03/28/2020 1500 Gross per 24 hour  Intake 937.91 ml  Output 1100 ml  Net -162.09 ml   Filed Weights   03/27/20 1442 03/28/20 0856  Weight: 31.9 kg 61.1 kg    Examination:  General exam: Appears calm and comfortable  Respiratory system: rhonchorous, crackles noted anterior lung fields.   Cardiovascular system: Regular rate rhythm no murmurs rubs or gallops.  No JVD.  2-3+ bilateral pedal edema.  Gastrointestinal system: Abdomen is soft, nontender, nondistended, positive bowel sounds.  No rebound.  No guarding.   Central nervous system: Alert and oriented. No focal neurological deficits. Extremities: 2-3+ bilateral pedal edema.  Symmetric 5 x 5 power. Skin: No rashes, lesions or ulcers Psychiatry: Judgement and insight appear normal. Mood & affect appropriate.     Data Reviewed: I have personally reviewed following labs and imaging studies  CBC: Recent Labs  Lab 03/27/20 0000 03/27/20 1436 03/28/20 0140  WBC 9.2 15.3* 14.6*  NEUTROABS 7,682 13.9*  --   HGB 12.1 11.9* 11.1*  HCT 36 36.8 33.6*  MCV  --  96.3 94.9  PLT 203 188 272    Basic Metabolic Panel: Recent Labs  Lab 03/27/20 0000 03/27/20 1436 03/28/20 0140  NA 138 142 145  K 4.7 4.2 4.0  CL 96* 103 107  CO2 31* 27 30  GLUCOSE  --  56* 54*  BUN 92* 86* 68*  CREATININE 3.0* 2.29* 1.73*  CALCIUM 8.9 8.7* 8.3*  MG  --   --  2.2  PHOS  --   --  4.2    GFR: Estimated Creatinine Clearance: 13.4 mL/min (A) (by C-G formula based on SCr of 1.73 mg/dL (H)).  Liver Function Tests: Recent Labs  Lab 03/27/20 0000  AST 32  ALT 34  ALKPHOS 201*    ALBUMIN 2.9*    CBG: Recent Labs  Lab 03/28/20 0651 03/28/20 0726 03/28/20 1114 03/28/20 1142 03/28/20 1551  GLUCAP 41* 114* 41* 118* 92     Recent Results (from the past 240 hour(s))  SARS Coronavirus 2 by RT PCR (hospital order, performed in [facility] hospital lab) Nasopharyngeal Nasopharyngeal Swab     Status: None   Collection Time: 03/27/20  2:27 PM   Specimen: Nasopharyngeal Swab  Result Value Ref Range Status   SARS Coronavirus 2 NEGATIVE NEGATIVE Final    Comment: (NOTE) SARS-CoV-2 target nucleic acids are NOT DETECTED. The SARS-CoV-2 RNA is generally detectable in upper and lower respiratory specimens during the acute phase of infection. The lowest concentration of SARS-CoV-2 viral copies this assay can detect is 250 copies / mL. A negative result does not preclude SARS-CoV-2 infection and should not be used as the sole basis for treatment or other patient management decisions.  A negative result may occur with improper specimen collection / handling, submission of specimen other than nasopharyngeal swab, presence of viral mutation(s) within the areas targeted by this assay, and inadequate number of viral copies (<250 copies / mL). A negative result must be combined with clinical observations, patient history, and epidemiological information. Fact Sheet for Patients:   StrictlyIdeas.no Fact Sheet for Healthcare Providers: BankingDealers.co.za This test is not yet approved or cleared  by the [place] FDA and has been authorized for detection and/or diagnosis of SARS-CoV-2 by FDA under an Emergency Use Authorization (EUA).  This EUA will remain in effect (meaning this test can be used) for the duration of the COVID-19 declaration under Section 564(b)(1) of the Act, 21 U.S.C. section 360bbb-3(b)(1), unless the authorization is terminated or revoked sooner. Performed at [facility], [place] [address],  [place], [place] [address]   Urine culture     Status: Abnormal   Collection Time: 03/27/20  5:06 PM   Specimen: Urine, Random  Result Value Ref Range Status   Specimen Description URINE, RANDOM  Final   Special Requests   Final    NONE Performed at [facility], [place]  [address] [place], [place] [address]    Culture MULTIPLE SPECIES PRESENT, SUGGEST RECOLLECTION (A)  Final   Report Status 03/28/2020 FINAL  Final  Culture, blood (routine x 2)     Status: None (Preliminary result)   Collection Time: 03/27/20  7:30 PM   Specimen: BLOOD RIGHT HAND  Result Value Ref Range Status   Specimen Description BLOOD RIGHT HAND  Final   Special Requests   Final    BOTTLES DRAWN AEROBIC AND ANAEROBIC Blood Culture adequate volume   Culture   Final    NO GROWTH < 12 HOURS Performed at [facility], [place] [address], [place], [place] [address]    Report Status PENDING  Incomplete  Culture, blood (routine x 2)     Status: None (Preliminary result)   Collection Time: 03/27/20  7:30 PM   Specimen: BLOOD  Result Value Ref Range Status   Specimen Description BLOOD RIGHT ANTECUBITAL  Final   Special Requests   Final    BOTTLES DRAWN AEROBIC AND ANAEROBIC Blood Culture adequate volume   Culture   Final    NO GROWTH < 12 HOURS Performed at [facility], [place] [address] [place], [place] [address]    Report Status PENDING  Incomplete         Radiology Studies: CT HEAD WO CONTRAST  Result Date: 03/27/2020 CLINICAL DATA:  86 year old female with encephalopathy. EXAM: CT HEAD WITHOUT CONTRAST TECHNIQUE: Contiguous axial images were obtained from the base of the skull through the vertex without intravenous contrast. COMPARISON:  Head CT 11/14/2005. FINDINGS: Brain: No midline shift, mass effect, or evidence of intracranial mass lesion. Cerebral volume is within normal limits for age. No ventriculomegaly. No acute intracranial hemorrhage identified. Widespread, patchy hypodensity in the  bilateral cerebral white matter, deep gray nuclei. Progression since 2007. No acute cortically based infarct or cortical encephalomalacia identified. Vascular: Calcified atherosclerosis at the skull base. No suspicious intracranial vascular hyperdensity. Skull: Advanced chronic degeneration at the skull base-C1 and odontoid. Associated cervicomedullary junction stenosis (series 3, image 1). No acute osseous abnormality identified. Sinuses/Orbits: Visualized paranasal sinuses and mastoids are stable and well pneumatized. Other: No acute orbit or scalp soft tissue finding. IMPRESSION: 1. No acute intracranial abnormality. Evidence of small vessel disease with progression since 2007. 2. Very severe degeneration of the skull base, C1, and odontoid with cervicomedullary junction stenosis. Electronically Signed   By: Genevie Ann M.D.   On: 03/27/2020 21:23   DG Chest Portable 1 View  Result Date: 03/27/2020 CLINICAL DATA:  Shortness of breath EXAM: PORTABLE CHEST 1 VIEW COMPARISON:  Feb 20, 2019 FINDINGS: There is airspace opacity in the left base with small left pleural effusion. The lungs elsewhere are clear. The heart is upper normal in size with pulmonary vascularity normal. There is aortic atherosclerosis. No adenopathy appreciable. There have been previous kyphoplasty procedures in the upper lumbar region. There is arthropathy in each shoulder with superior migration of each humeral head. Bones are osteoporotic. IMPRESSION: Airspace consolidation consistent with pneumonia left base with small left pleural effusion. Lungs elsewhere clear. Stable cardiac prominence. Aortic Atherosclerosis (ICD10-I70.0). Bones osteoporotic. Probable chronic rotator cuff tears bilaterally, characterized by superior migration of each humeral head. Electronically Signed   By: Lowella Grip III M.D.   On: 03/27/2020 15:04   DG Hip Port Unilat W or Wo Pelvis 1 View Right  Result Date: 03/27/2020 CLINICAL DATA:  Recent hip surgery,  multiple falls EXAM: DG HIP (WITH OR WITHOUT PELVIS) 1V  PORT RIGHT COMPARISON:  12/21/2018 FINDINGS: Frontal view of the pelvis as well as frontal view of the right hip are obtained. Since the prior exam, intramedullary rod with proximal dynamic and distal interlocking screw has been placed across the previously seen intertrochanteric right hip fracture. There has been bony resorption of the femoral head, with migration of the dynamic screw now resulting in acetabular roof erosion. Residual fracture line can be seen along the remaining portion of the femoral neck consistent with nonunion. No acute bony abnormality.  Stable left hip osteoarthritis. IMPRESSION: 1. 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 Bony resorption of the majority of the right femoral head, with bony erosion of the acetabular roof by the dynamic screw. 2. Stable left hip osteoarthritis. Electronically Signed   By: Randa Ngo M.D.   On: 03/27/2020 15:29        Scheduled Meds:  azelastine  2 spray Each Nare Daily   dextromethorphan-guaiFENesin  1 tablet Oral BID   dextrose       fluticasone  2 spray Each Nare Daily   heparin  5,000 Units Subcutaneous Q8H   loratadine  10 mg Oral Daily   pantoprazole  40 mg Oral Q0600   Continuous Infusions:  ceFEPime (MAXIPIME) IV Stopped (03/28/20 1140)   dextrose 5 % and 0.45% NaCl 10 mL/hr at 03/28/20 1500   doxycycline (VIBRAMYCIN) IV Stopped (03/28/20 1048)     LOS: 1 day    Time spent: 40 minutes    Irine Seal, MD [facility]   To contact the attending provider between 7A-7P or the covering provider during after hours 7P-7A, please log into the web site [email or web address] and access using universal [place] password for that web site. If you do not have the password, please call the hospital operator.  03/28/2020, 5:06 PM

## 2020-03-29 DIAGNOSIS — E870 Hyperosmolality and hypernatremia: Secondary | ICD-10-CM

## 2020-03-29 DIAGNOSIS — E876 Hypokalemia: Secondary | ICD-10-CM

## 2020-03-29 LAB — CBC
HCT: 36 % (ref 36.0–46.0)
Hemoglobin: 11.7 g/dL — ABNORMAL LOW (ref 12.0–15.0)
MCH: 31.2 pg (ref 26.0–34.0)
MCHC: 32.5 g/dL (ref 30.0–36.0)
MCV: 96 fL (ref 80.0–100.0)
Platelets: 154 10*3/uL (ref 150–400)
RBC: 3.75 MIL/uL — ABNORMAL LOW (ref 3.87–5.11)
RDW: 15.1 % (ref 11.5–15.5)
WBC: 12.3 10*3/uL — ABNORMAL HIGH (ref 4.0–10.5)
nRBC: 0.2 % (ref 0.0–0.2)

## 2020-03-29 LAB — BASIC METABOLIC PANEL
Anion gap: 9 (ref 5–15)
BUN: 36 mg/dL — ABNORMAL HIGH (ref 8–23)
CO2: 30 mmol/L (ref 22–32)
Calcium: 8 mg/dL — ABNORMAL LOW (ref 8.9–10.3)
Chloride: 108 mmol/L (ref 98–111)
Creatinine, Ser: 1.26 mg/dL — ABNORMAL HIGH (ref 0.44–1.00)
GFR calc Af Amer: 42 mL/min — ABNORMAL LOW (ref >60)
GFR calc non Af Amer: 36 mL/min — ABNORMAL LOW (ref >60)
Glucose, Bld: 82 mg/dL (ref 70–99)
Potassium: 3.3 mmol/L — ABNORMAL LOW (ref 3.5–5.1)
Sodium: 147 mmol/L — ABNORMAL HIGH (ref 135–145)

## 2020-03-29 LAB — GLUCOSE, CAPILLARY
Glucose-Capillary: 101 mg/dL — ABNORMAL HIGH (ref 70–99)
Glucose-Capillary: 103 mg/dL — ABNORMAL HIGH (ref 70–99)
Glucose-Capillary: 71 mg/dL (ref 70–99)
Glucose-Capillary: 80 mg/dL (ref 70–99)
Glucose-Capillary: 82 mg/dL (ref 70–99)
Glucose-Capillary: 84 mg/dL (ref 70–99)
Glucose-Capillary: 91 mg/dL (ref 70–99)

## 2020-03-29 MED ORDER — ADULT MULTIVITAMIN W/MINERALS CH
1.0000 | ORAL_TABLET | Freq: Every day | ORAL | Status: DC
  Administered 2020-03-31 – 2020-04-03 (×4): 1 via ORAL
  Filled 2020-03-29 (×5): qty 1

## 2020-03-29 MED ORDER — PRO-STAT SUGAR FREE PO LIQD
30.0000 mL | Freq: Two times a day (BID) | ORAL | Status: DC
  Administered 2020-03-29 – 2020-03-31 (×6): 30 mL via ORAL
  Filled 2020-03-29 (×6): qty 30

## 2020-03-29 MED ORDER — DEXTROSE 5 % IV SOLN: INTRAVENOUS | Status: DC

## 2020-03-29 NOTE — Progress Notes (Signed)
Pt's son, [name], called for update on pt status. RN answered all questions to satisfaction. Will continue to monitor.

## 2020-03-29 NOTE — TOC Progression Note (Signed)
Transition of Care [facility]) - Progression Note    Patient Details  Name: [name] MRN: [id number] Date of Birth: 1922/11/09  Transition of Care [facility]) CM/SW [place], Frederic Phone Number: 03/29/2020, 8:46 AM  Clinical Narrative:    Acknowledging consult for SNF, pt from SNF and SNF work up already completed by this Probation officer yesterday. Pt will need new authorization through [facility] when closer to d/c, as well as a new COVID swab. Yates Decamp aware with [place] [facility]missions and is following.  Pt family preferred plan is for return to SNF.   Expected Discharge Plan: [place] Barriers to Discharge: Continued Medical Work up, Ship broker  Expected Discharge Plan and Services Expected Discharge Plan: [place] In-house Referral: Clinical Social Work Discharge Planning Services: CM Consult Post Acute Care Choice: Resumption of Product/process development scientist, [place] arrangements for the past 2 months: [place]                   Readmission Risk Interventions Readmission Risk Prevention Plan 03/28/2020  Transportation Screening Complete  PCP or Specialist Appt within 5-7 Days Complete  Home Care Screening Not Complete  Home Care Screening Not Completed Comments SNF resident  Medication Review (RN CM) Referral to Pharmacy  Some recent data might be hidden

## 2020-03-29 NOTE — Evaluation (Signed)
Speech Language Pathology Evaluation Patient Details Name: ZIASIA LENOIR MRN: [id number] DOB: 1922-12-16 Today's Date: 03/29/2020 Time: 7169-6789 SLP Time Calculation (min) (ACUTE ONLY): 10 min  Problem List:  Patient Active Problem List   Diagnosis Date Noted  . Pressure injury of skin 03/28/2020  . Acute on chronic diastolic CHF (congestive heart failure) ([place]) 03/28/2020  . Acute cystitis with hematuria   . HCAP (healthcare-associated pneumonia)   . Altered mental status 03/27/2020  . Acute kidney injury superimposed on CKD ([place]) 03/27/2020  . Hypoxemia 03/27/2020  . CAP (community acquired pneumonia) 03/27/2020  . Cystitis 03/27/2020  . Acute encephalopathy 03/27/2020  . Rash 03/25/2020  . Sunburn of second degree 02/29/2020  . Right wrist fracture 01/03/2020  . Fall 12/25/2019  . Right hip pain 11/15/2019  . Slow transit constipation 09/10/2019  . Chronic bronchitis ([place]) 07/21/2019  . Pleural effusion 07/21/2019  . CKD (chronic kidney disease) stage 4, GFR 15-29 ml/min (HCC) 06/23/2019  . Diastolic dysfunction 38/07/1750  . Depression due to dementia ([place]) 06/06/2019  . Hallucination, visual 03/28/2019  . Weight gain 03/28/2019  . Delirium 03/04/2019  . Cognitive impairment 03/04/2019  . Anemia 02/21/2019  . Nondisplaced fracture of distal phalanx of right lesser toe(s), initial encounter for closed fracture 12/23/2018  . Closed right hip fracture ([place]) 12/21/2018  . Closed right hip fracture, initial encounter ([place]) 12/21/2018  . Protein-calorie malnutrition, severe 12/21/2018  . Closed intertrochanteric fracture of hip, right, initial encounter ([place])   . Edema of extremities 04/12/2018  . Aortic stenosis, mild 03/03/2016  . MVP (mitral valve prolapse) 03/03/2016  . Bradycardia 08/28/2015  . Takotsubo cardiomyopathy   . Old MI (myocardial infarction)   . Back pain   . SIADH (syndrome of inappropriate ADH production) ([place])   . Ventricular tachycardia  ([place])   . GI bleed due to NSAIDs   . Hyperlipidemia   . Persistent atrial fibrillation ([place])   . Umbilical hernia 02/58/5277  . S/P cholecystectomy 12/03/2011  . Polymyalgia rheumatica ([place]) 10/08/2011  . Essential hypertension, benign 10/08/2011  . Esophageal reflux 10/08/2011   Past Medical History:  Past Medical History:  Diagnosis Date  . Amputation of finger of right hand   . Aortic stenosis, mild 03/03/2016   By echo 08/2015  . Arthritis   . Back pain   . Bradycardia 08/28/2015  . CKD (chronic kidney disease) stage 3, GFR 30-59 ml/min   . Closed right hip fracture ([place]) 12/2018  . Depression   . GERD (gastroesophageal reflux disease)   . GI bleed due to NSAIDs   . Hyperlipidemia   . Hypertension   . Macular degeneration   . Maxillary sinusitis   . MVP (mitral valve prolapse) 03/03/2016   Bileaflet MVP with mild MR by echo 2016  . Old MI (myocardial infarction)    NSTEMI secondary to stress MI/Takotsubo CM, minimal nonobstructive ASCAD at cath  . Osteoporosis   . Persistent atrial fibrillation ([place]) 2006   not on anticoagulation due to GI bleed and increased fall risk  . PMR (polymyalgia rheumatica) (HCC)   . SIADH (syndrome of inappropriate ADH production) ([place])   . Takotsubo cardiomyopathy    EF normalized to 70%  . Ventricular tachycardia ([place])    on initial presentation of MI   Past Surgical History:  Past Surgical History:  Procedure Laterality Date  . CARDIAC CATHETERIZATION     normal coronary arteries  . CHOLECYSTECTOMY  11/10/2011   Procedure: LAPAROSCOPIC CHOLECYSTECTOMY WITH INTRAOPERATIVE CHOLANGIOGRAM;  Surgeon:  Pedro Earls, MD;  Location: [facility];  Service: General;  Laterality: N/A;  . EYE SURGERY  06/10/11   membrane peel   . INTRAMEDULLARY (IM) NAIL INTERTROCHANTERIC Right 12/21/2018   Procedure: INTRAMEDULLARY (IM) NAIL INTERTROCHANTRIC;  Surgeon: Nicholes Stairs, MD;  Location: [facility];  Service: Orthopedics;  Laterality: Right;  . NASAL  SINUS SURGERY Left   . ROTATOR CUFF REPAIR  1998  . UMBILICAL HERNIA REPAIR  11/10/2011   Procedure: HERNIA REPAIR UMBILICAL ADULT;  Surgeon: Pedro Earls, MD;  Location: [facility];  Service: General;  Laterality: N/A;  . WISDOM TOOTH EXTRACTION     HPI:  This 86 y.o. female admitted with AMS.  CXR consistent with PNA.  Also found to have UTI.  PMH includes:  hip fx with ORIF 12/2018, takotsubo cardiomyopathy, aortic stenosis, and mild dementia, macular degeneration, persistent A-Fib, osteoporosis, h/o MI, bradycardia, s/p amputation of finger Rt hand, ventricular tachycardia    Assessment / Plan / Recommendation Clinical Impression   Pt presents with moderately severe cognitive deficits which are likely related to lethargy and AMS in the setting of acute illness.  She is oriented to self only and can sustain her attention to basic, familiar tasks for ~1-3 minute intervals before requiring cues for redirection.  She had decreased task initiation during self feeding despite verbal cues and being allowed increased time.  She also appeared to have decreased recall of simple instructions and biographical information (I.e. pt answered "no" when asked if she had children) even when hearing impairment was accounted and compensated for.  Pt's speech intelligibility was impacted by lethargy and respiratory deconditioning which resulted in decreased speech intelligibility at the phrase level; however, will not set goals for intelligibility at this time as I suspect her speech will improve as her alertness and breathing improve.  Pt would benefit from ST services acutely for cognitive deficits and dysphagia at this time.      SLP Assessment  SLP Recommendation/Assessment: Patient needs continued Speech Lanaguage Pathology Services SLP Visit Diagnosis: Dysphagia, oropharyngeal phase (R13.12);Cognitive communication deficit (R41.841)    Follow Up Recommendations  Skilled Nursing facility    Frequency and  Duration min 2x/week         SLP Evaluation Cognition  Overall Cognitive Status: Impaired/Different from baseline Arousal/Alertness: Lethargic Orientation Level: Oriented to person;Disoriented to place;Disoriented to time;Disoriented to situation Attention: Sustained Sustained Attention: Impaired Sustained Attention Impairment: Verbal basic;Functional basic Memory: Impaired Memory Impairment: Storage deficit Awareness: Impaired Awareness Impairment: Intellectual impairment Problem Solving: Impaired Problem Solving Impairment: Verbal basic;Functional basic Safety/Judgment: Impaired       Comprehension  Auditory Comprehension Overall Auditory Comprehension: Appears within functional limits for tasks assessed    Expression Expression Primary Mode of Expression: Verbal Verbal Expression Overall Verbal Expression: Appears within functional limits for tasks assessed   Oral / Motor  Oral Motor/Sensory Function Overall Oral Motor/Sensory Function: Generalized oral weakness Motor Speech Overall Motor Speech: Impaired Respiration: Impaired Level of Impairment: Phrase Phonation: Low vocal intensity Resonance: Within functional limits Articulation: Impaired Level of Impairment: Phrase Intelligibility: Intelligibility reduced Phrase: 50-74% accurate Motor Planning: Witnin functional limits   GO                    Emilio Math 03/29/2020, 9:36 AM

## 2020-03-29 NOTE — Evaluation (Signed)
Clinical/Bedside Swallow Evaluation Patient Details  Name: [name] MRN: [id number] Date of Birth: [birth year]  Today's Date: 03/29/2020 Time: SLP Start Time (ACUTE ONLY): 8921 SLP Stop Time (ACUTE ONLY): 0905 SLP Time Calculation (min) (ACUTE ONLY): 10 min  Past Medical History:  Past Medical History:  Diagnosis Date  . Amputation of finger of right hand   . Aortic stenosis, mild 03/03/2016   By echo 08/2015  . Arthritis   . Back pain   . Bradycardia 08/28/2015  . CKD (chronic kidney disease) stage 3, GFR 30-59 ml/min   . Closed right hip fracture ([place]) 12/2018  . Depression   . GERD (gastroesophageal reflux disease)   . GI bleed due to NSAIDs   . Hyperlipidemia   . Hypertension   . Macular degeneration   . Maxillary sinusitis   . MVP (mitral valve prolapse) 03/03/2016   Bileaflet MVP with mild MR by echo 2016  . Old MI (myocardial infarction)    NSTEMI secondary to stress MI/Takotsubo CM, minimal nonobstructive ASCAD at cath  . Osteoporosis   . Persistent atrial fibrillation ([place]) 2006   not on anticoagulation due to GI bleed and increased fall risk  . PMR (polymyalgia rheumatica) (HCC)   . SIADH (syndrome of inappropriate ADH production) ([place])   . Takotsubo cardiomyopathy    EF normalized to 70%  . Ventricular tachycardia ([place])    on initial presentation of MI   Past Surgical History:  Past Surgical History:  Procedure Laterality Date  . CARDIAC CATHETERIZATION     normal coronary arteries  . CHOLECYSTECTOMY  11/10/2011   Procedure: LAPAROSCOPIC CHOLECYSTECTOMY WITH INTRAOPERATIVE CHOLANGIOGRAM;  Surgeon: Pedro Earls, MD;  [facility];  Service: General;  Laterality: N/A;  . EYE SURGERY  06/10/11   membrane peel   . INTRAMEDULLARY (IM) NAIL INTERTROCHANTERIC Right 12/21/2018   Procedure: INTRAMEDULLARY (IM) NAIL INTERTROCHANTRIC;  Surgeon: Nicholes Stairs, MD;  Location: [facility];  Service: Orthopedics;  Laterality: Right;  . NASAL SINUS  SURGERY Left   . ROTATOR CUFF REPAIR  1998  . UMBILICAL HERNIA REPAIR  11/10/2011   Procedure: HERNIA REPAIR UMBILICAL ADULT;  Surgeon: Pedro Earls, MD;  [facility];  Service: General;  Laterality: N/A;  . WISDOM TOOTH EXTRACTION     HPI:  This 86 y.o. female admitted with AMS.  CXR consistent with PNA.  Also found to have UTI.  PMH includes:  hip fx with ORIF 12/2018, takotsubo cardiomyopathy, aortic stenosis, and mild dementia, macular degeneration, persistent A-Fib, osteoporosis, h/o MI, bradycardia, s/p amputation of finger Rt hand, ventricular tachycardia    Assessment / Plan / Recommendation Clinical Impression   Pt presents with consistent mild delayed throat clearing after all PO intake which SLP suspects is related to respiratory deconditioning and lethargy rather than any acute changes to the integrity of her swallowing mechanism.  Swallow response subjectively appears delayed and oral phase is prolonged due to slowed mastication, again likely related to the deficits mentioned above and this could also be contributing to throat clearing following POs.  I suspect that pt's primary barriers at this time will be maintaining nutrition and hydration orally given her lethargy; however, I'm hopeful that with treatment for PNA and UTI that pt's alertness and endurance will improve quickly.  For now, I'm recommending a diet of dys 2 textures and thin liquids to allow for energy conservation during meals.     SLP Visit Diagnosis: Dysphagia, oropharyngeal phase (R13.12);Cognitive communication deficit (R41.841)  Aspiration Risk  Moderate aspiration risk    Diet Recommendation Dysphagia 2 (Fine chop);Thin liquid   Liquid Administration via: Cup;Straw Supervision: Full supervision/cueing for compensatory strategies Compensations: Slow rate;Small sips/bites;Minimize environmental distractions Postural Changes: Seated upright at 90 degrees    Other  Recommendations Oral Care  Recommendations: Oral care BID   Follow up Recommendations Skilled Nursing facility      Frequency and Duration min 2x/week          Prognosis Prognosis for Safe Diet Advancement: Good Barriers to Reach Goals: Cognitive deficits      Swallow Study   General HPI: This 86 y.o. female admitted with AMS.  CXR consistent with PNA.  Also found to have UTI.  PMH includes:  hip fx with ORIF 12/2018, takotsubo cardiomyopathy, aortic stenosis, and mild dementia, macular degeneration, persistent A-Fib, osteoporosis, h/o MI, bradycardia, s/p amputation of finger Rt hand, ventricular tachycardia  Type of Study: Bedside Swallow Evaluation Previous Swallow Assessment: none on record Diet Prior to this Study: Thin liquids Temperature Spikes Noted: No Respiratory Status: Nasal cannula History of Recent Intubation: No Behavior/Cognition: Cooperative;Confused;Requires cueing;Lethargic/Drowsy Oral Cavity - Dentition: Adequate natural dentition;Poor condition Self-Feeding Abilities: Needs assist Patient Positioning: Upright in bed Baseline Vocal Quality: Low vocal intensity    Oral/Motor/Sensory Function Overall Oral Motor/Sensory Function: Generalized oral weakness   Ice Chips     Thin Liquid Thin Liquid: Impaired Pharyngeal  Phase Impairments: Throat Clearing - Delayed    Nectar Thick     Honey Thick     Puree Puree: Impaired Pharyngeal Phase Impairments: Throat Clearing - Delayed   Solid     Solid: Impaired Oral Phase Impairments: Impaired mastication;Reduced lingual movement/coordination Oral Phase Functional Implications: Prolonged oral transit;Impaired mastication Pharyngeal Phase Impairments: Throat Clearing - Delayed      Breleigh Carpino, Selinda Orion 03/29/2020,9:21 AM

## 2020-03-29 NOTE — Progress Notes (Addendum)
Nutrition Follow-up  DOCUMENTATION CODES:   Severe malnutrition in context of chronic illness  INTERVENTION:   -MVI with minerals daily -Magic cup TID with meals, each supplement provides 290 kcal and 9 grams of protein -Hormel Shake TID with meals, each supplement provides 520 kcals and 22 grams protein -30 ml Prostat BID, each supplement provides 100 kcals and 15 grams protein -Feeding assistance with meals  NUTRITION DIAGNOSIS:   Severe Malnutrition related to chronic illness (dementia) as evidenced by moderate fat depletion, severe fat depletion, moderate muscle depletion, severe muscle depletion.  Ongoing  GOAL:   Patient will meet greater than or equal to 90% of their needs  Progressing   MONITOR:   PO intake, Supplement acceptance, Diet advancement, Labs, Weight trends, Skin, I & O's  REASON FOR ASSESSMENT:   Consult Assessment of nutrition requirement/status  ASSESSMENT:   86 y.o. female with PMH of right hip fracture s/p fixation March 2020, Takotsubo cardiomyopathy, aortic stenosis and mild dementia who presented to ED with AMS and admitted for CAP and UTI.  6/10- s/p BSE- advanced to dysphagia 2 diet with thin liquids  Reviewed I/O's: +866 ml x 24 hours and -716 ml since admission  UOP: 2.3 L x 24 hours  Pt underwent BSE and advanced to diet. Due to pt's mental status, suspect oral intake may be inadequate. However, hopeful for improvement with UTI.   Labs reviewed: CBGS: 71-84.   Diet Order:   Diet Order            DIET DYS 2 Room service appropriate? Yes; Fluid consistency: Thin  Diet effective now                 EDUCATION NEEDS:   Not appropriate for education at this time  Skin:  Skin Assessment: Skin Integrity Issues: Skin Integrity Issues:: Stage I Stage I: coccyx  Last BM:  Unknown  Height:   Ht Readings from Last 1 Encounters:  03/27/20 4\' 6"  (1.372 m)    Weight:   Wt Readings from Last 1 Encounters:  03/29/20 61.1 kg    BMI:  Body mass index is 32.48 kg/m.  Estimated Nutritional Needs:   Kcal:  1700-1900  Protein:  80-95 grams  Fluid:  > 1.7 L    Loistine Chance, RD, LDN, [place] Registered Dietitian II Certified Diabetes Care and Education Specialist Please refer to [employer] for RD and/or RD on-call/weekend/after hours pager

## 2020-03-29 NOTE — Plan of Care (Signed)

## 2020-03-29 NOTE — Progress Notes (Signed)
PROGRESS NOTE    [name]  FWY:[id number] DOB: 10-05-1923 DOA: 03/27/2020 PCP: [name], MD    Chief Complaint  Patient presents with  . AMS    Brief Narrative:  HPI per Dr. Ina Kick is a 86 y.o. female with PMH of right hip fracture s/p fixation March 2020,Takotsubo cardiomyopathy, aortic stenosis and mild dementia who presented to ED with AMS and admitted for CAP and UTI.  Patient non-verbal currently. All history provided by patient's son. [name], [name], reports that patient lives in a nursing home after her physical decline following hip fracture. At baseline, she walks with a walker and converses normally. Reports only very mild dementia that is usually not noticeable. He went to visit her on Sunday and noticed that she was not quite as mentally clear and her speech was a little different. His sister saw her on Monday and noted similar findings. Yesterday they obtained imaging which was c/w PNA and patient was started on Doxycyline. [name] went to see her today and noticed a marked decline. She was completely disoriented, not speaking and not walking. The NP at the nursing home obtained labs and was concerned for dehydration and gave IVF. They then recommended she come to the ER.   In the ED: Vitals stable initially on room air and then RN placed on 1.5 L O2 after desat to 88%. Labs remarkable for COVID neg, glucose 56, Cr 2.29, WBC 15.3, BNP 244, Trop 10>12, UA with large leuks and > 50 WBCs. Lactate 0.9.   CXR: Airspace consolidation consistent with pneumonia left base with small left pleural effusion. Lungs elsewhere clear. Stable cardiac Prominence.  XR Hip: 1. 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 Bony resorption of the majority of the right femoral head, with bony erosion of the acetabular roof by the dynamic screw. 2. Stable left hip osteoarthritis.  Patient was given IVF, Doxy and  Rocephin and called for admission.   Assessment & Plan:   Principal Problem:   Acute encephalopathy Active Problems:   SIADH (syndrome of inappropriate ADH production) (HCC)   Edema of extremities   Protein-calorie malnutrition, severe   Depression due to dementia (HCC)   CKD (chronic kidney disease) stage 4, GFR 15-29 ml/min (HCC)   Diastolic dysfunction   Pleural effusion   Acute kidney injury superimposed on CKD (HCC)   Hypoxemia   CAP (community acquired pneumonia)   Cystitis   Pressure injury of skin   Acute on chronic diastolic CHF (congestive heart failure) ([place])   Acute cystitis with hematuria   HCAP (healthcare-associated pneumonia)  1 acute toxic metabolic encephalopathy Likely multifactorial secondary to healthcare associated pneumonia (from SNF), UTI, episodes of hypoglycemia.  Patient noted to have 2 episodes of hypoglycemia with CBGs of 41(03/28/2020).  CT head done negative for any acute abnormalities.  CBGs improving.  Patient seen by speech therapy and patient on a dysphagia 2 diet.  Urine Legionella antigen pending.  Urine pneumococcus antigen negative.  Continue IV cefepime, IV doxycycline.  Continue D5 normal saline at 10 cc/h.  If CBGs remained stable over the next 24 hours could likely discontinue D5.  Patient noted to be volume overloaded on examination and subsequently placed on IV Lasix with good diuresis.  Continue supportive care.  Follow.   2.  HCAP/acute respiratory failure with hypoxia/chronic pleural effusion Noted on imaging.  Patient noted to have an allergy to azithromycin.  Urine Legionella antigen pending.  Urine pneumococcus  antigen negative.  Blood cultures pending with no growth to date.  Lactate within normal limits.  As patient from a nursing facility IV Rocephin was changed to IV cefepime.  Continue IV doxycycline, Mucinex, Flonase, Claritin, PPI.  Follow.   3.  Acute renal failure on chronic kidney disease stage 4 Baseline creatinine 1.3.   Creatinine on admission was 2.29 on admission.  Patient noted to have received IV fluids in the nursing home and in the ED.  Patient volume overloaded on examination.  Patient noted to have poor oral intake.  Creatinine trending down and improving with diuresis.  Creatinine currently at 1.26.  Continue IV Lasix through today and likely transition back to home regimen oral Demadex tomorrow.  Continue strict I's and O's.  Daily weights.  Follow.    4.  UTI Urine cultures with multiple species.  Follow.   5.  Acute on chronic diastolic heart failure Patient noted to have a grade 1 diastolic dysfunction per 2D echo 2019.  Patient volume overloaded on examination.  Cardiac enzymes negative.  BNP at 244.9 on admission.  2D echo pending.  Patient placed on Lasix 20 mg IV every 12 hours with a urine output of 2.330 L over the past 24 hours.  Continue IV Lasix through today and transition back to home dose oral torsemide tomorrow.  Strict I's and O's.  Daily weights.  Follow.  6.  Depression Continue to hold Wellbutrin until improvement with oral intake and tolerating a diet.  7.  Severe protein calorie malnutrition Nutrition consulted.  Magnesium at 2.2.  Phosphorus at 4.2.  Follow.  8.  Stage I pressure injury coccyx, POA Continue current wound care.  9.  SIADH Resume home regimen demeclocycline.  10.  Hypokalemia Replete.  11.  Hypernatremia Change D5 normal saline to D5W.  Continue IV Lasix through today and likely transition to home dose oral torsemide tomorrow.  Follow.  Pressure Injury 03/27/20 Coccyx Medial Stage 1 -  Intact skin with non-blanchable redness of a localized area usually over a bony prominence. (Active)  03/27/20 2300  Location: Coccyx  Location Orientation: Medial  Staging: Stage 1 -  Intact skin with non-blanchable redness of a localized area usually over a bony prominence.  Wound Description (Comments):   Present on Admission:           DVT prophylaxis:  Heparin Code Status: DNR Family Communication: No family at bedside.  Disposition:   Status is: Inpatient    Dispo: The patient is from: SNF              Anticipated d/c is to: Likely back to SNF              Anticipated d/c date is: 3 to 4 days.              Patient currently on IV antibiotics, and acute metabolic encephalopathy, volume overloaded on examination on IV Lasix, hypoglycemic.  Currently not stable for discharge.       Consultants:   None  Procedures:   CT head without contrast 03/27/2020  Chest x-ray 03/27/2020  Plain films of right hip and pelvis 03/27/2020  Antimicrobials:   Cefepime 03/28/2020  IV doxycycline 03/27/2020  IV Rocephin 03/27/2020>>:>>> 03/28/2020   Subjective: Patient drowsy but a little more alert today.  No family at bedside.  Patient very hard of hearing.   Objective: Vitals:   03/28/20 1420 03/28/20 2017 03/29/20 0402 03/29/20 0500  BP: (!) 115/55 (!) 127/56 (!) 126/49  Pulse: 68 70 64   Resp: 17 20 15    Temp: 98.5 F (36.9 C) 98.5 F (36.9 C) 98.5 F (36.9 C)   TempSrc: Oral Oral Oral   SpO2: 99% 100% 99%   Weight:    61.1 kg  Height:        Intake/Output Summary (Last 24 hours) at 03/29/2020 1129 Last data filed at 03/29/2020 0730 Gross per 24 hour  Intake 1584.36 ml  Output 2630 ml  Net -1045.64 ml   Filed Weights   03/27/20 1442 03/28/20 0856 03/29/20 0500  Weight: 31.9 kg 61.1 kg 61.1 kg    Examination:  General exam: Appears calm and comfortable  Respiratory system: Less rhonchorous/crackles anterior lung fields.   Cardiovascular system: RRR no murmurs rubs or gallops.  1-2+ bilateral pedal edema.  Gastrointestinal system: Abdomen is soft, nontender, nondistended, positive bowel sounds.  No rebound.  No guarding.   Central nervous system: Alert. No focal neurological deficits. Extremities: 1-2+ bilateral pedal edema.  Symmetric 5 x 5 power. Skin: No rashes, lesions or ulcers Psychiatry: Judgement and insight  appear poor. Mood & affect unable to assess.     Data Reviewed: I have personally reviewed following labs and imaging studies  CBC: Recent Labs  Lab 03/27/20 0000 03/27/20 1436 03/28/20 0140 03/29/20 0140  WBC 9.2 15.3* 14.6* 12.3*  NEUTROABS 7,682 13.9*  --   --   HGB 12.1 11.9* 11.1* 11.7*  HCT 36 36.8 33.6* 36.0  MCV  --  96.3 94.9 96.0  PLT 203 188 166 676    Basic Metabolic Panel: Recent Labs  Lab 03/27/20 0000 03/27/20 1436 03/28/20 0140  NA 138 142 145  K 4.7 4.2 4.0  CL 96* 103 107  CO2 31* 27 30  GLUCOSE  --  56* 54*  BUN 92* 86* 68*  CREATININE 3.0* 2.29* 1.73*  CALCIUM 8.9 8.7* 8.3*  MG  --   --  2.2  PHOS  --   --  4.2    GFR: Estimated Creatinine Clearance: 13.4 mL/min (A) (by C-G formula based on SCr of 1.73 mg/dL (H)).  Liver Function Tests: Recent Labs  Lab 03/27/20 0000  AST 32  ALT 34  ALKPHOS 201*  ALBUMIN 2.9*    CBG: Recent Labs  Lab 03/28/20 1551 03/28/20 2019 03/28/20 2359 03/29/20 0404 03/29/20 0755  GLUCAP 92 119* 80 84 71     Recent Results (from the past 240 hour(s))  SARS Coronavirus 2 by RT PCR (hospital order, performed in [facility] hospital lab) Nasopharyngeal Nasopharyngeal Swab     Status: None   Collection Time: 03/27/20  2:27 PM   Specimen: Nasopharyngeal Swab  Result Value Ref Range Status   SARS Coronavirus 2 NEGATIVE NEGATIVE Final    Comment: (NOTE) SARS-CoV-2 target nucleic acids are NOT DETECTED. The SARS-CoV-2 RNA is generally detectable in upper and lower respiratory specimens during the acute phase of infection. The lowest concentration of SARS-CoV-2 viral copies this assay can detect is 250 copies / mL. A negative result does not preclude SARS-CoV-2 infection and should not be used as the sole basis for treatment or other patient management decisions.  A negative result may occur with improper specimen collection / handling, submission of specimen other than nasopharyngeal swab, presence of  viral mutation(s) within the areas targeted by this assay, and inadequate number of viral copies (<250 copies / mL). A negative result must be combined with clinical observations, patient history, and epidemiological information. Fact Sheet for Patients:  StrictlyIdeas.no Fact Sheet for Healthcare Providers: BankingDealers.co.za This test is not yet approved or cleared  by the [place] FDA and has been authorized for detection and/or diagnosis of SARS-CoV-2 by FDA under an Emergency Use Authorization (EUA).  This EUA will remain in effect (meaning this test can be used) for the duration of the COVID-19 declaration under Section 564(b)(1) of the Act, 21 U.S.C. section 360bbb-3(b)(1), unless the authorization is terminated or revoked sooner. Performed at [facility], [place] [address], [place], [place] [address]   Urine culture     Status: Abnormal   Collection Time: 03/27/20  5:06 PM   Specimen: Urine, Random  Result Value Ref Range Status   Specimen Description URINE, RANDOM  Final   Special Requests   Final    NONE Performed at [facility], [place] [address] [place], [place] [address]    Culture MULTIPLE SPECIES PRESENT, SUGGEST RECOLLECTION (A)  Final   Report Status 03/28/2020 FINAL  Final  Culture, blood (routine x 2)     Status: None (Preliminary result)   Collection Time: 03/27/20  7:30 PM   Specimen: BLOOD RIGHT HAND  Result Value Ref Range Status   Specimen Description BLOOD RIGHT HAND  Final   Special Requests   Final    BOTTLES DRAWN AEROBIC AND ANAEROBIC Blood Culture adequate volume   Culture   Final    NO GROWTH 2 DAYS Performed at [facility], [place] [address], [place], [place] [address]    Report Status PENDING  Incomplete  Culture, blood (routine x 2)     Status: None (Preliminary result)   Collection Time: 03/27/20  7:30 PM   Specimen: BLOOD  Result Value Ref Range Status   Specimen  Description BLOOD RIGHT ANTECUBITAL  Final   Special Requests   Final    BOTTLES DRAWN AEROBIC AND ANAEROBIC Blood Culture adequate volume   Culture   Final    NO GROWTH 2 DAYS Performed at [facility], [place] [address], [place], [place] [address]    Report Status PENDING  Incomplete         Radiology Studies: CT HEAD WO CONTRAST  Result Date: 03/27/2020 CLINICAL DATA:  86 year old female with encephalopathy. EXAM: CT HEAD WITHOUT CONTRAST TECHNIQUE: Contiguous axial images were obtained from the base of the skull through the vertex without intravenous contrast. COMPARISON:  Head CT 11/14/2005. FINDINGS: Brain: No midline shift, mass effect, or evidence of intracranial mass lesion. Cerebral volume is within normal limits for age. No ventriculomegaly. No acute intracranial hemorrhage identified. Widespread, patchy hypodensity in the bilateral cerebral white matter, deep gray nuclei. Progression since 2007. No acute cortically based infarct or cortical encephalomalacia identified. Vascular: Calcified atherosclerosis at the skull base. No suspicious intracranial vascular hyperdensity. Skull: Advanced chronic degeneration at the skull base-C1 and odontoid. Associated cervicomedullary junction stenosis (series 3, image 1). No acute osseous abnormality identified. Sinuses/Orbits: Visualized paranasal sinuses and mastoids are stable and well pneumatized. Other: No acute orbit or scalp soft tissue finding. IMPRESSION: 1. No acute intracranial abnormality. Evidence of small vessel disease with progression since 2007. 2. Very severe degeneration of the skull base, C1, and odontoid with cervicomedullary junction stenosis. Electronically Signed   By: Genevie Ann M.D.   On: 03/27/2020 21:23   DG Chest Portable 1 View  Result Date: 03/27/2020 CLINICAL DATA:  Shortness of breath EXAM: PORTABLE CHEST 1 VIEW COMPARISON:  Feb 20, 2019 FINDINGS: There is airspace opacity in the  left base with small left pleural  effusion. The lungs elsewhere are clear. The heart is upper normal in size with pulmonary vascularity normal. There is aortic atherosclerosis. No adenopathy appreciable. There have been previous kyphoplasty procedures in the upper lumbar region. There is arthropathy in each shoulder with superior migration of each humeral head. Bones are osteoporotic. IMPRESSION: Airspace consolidation consistent with pneumonia left base with small left pleural effusion. Lungs elsewhere clear. Stable cardiac prominence. Aortic Atherosclerosis (ICD10-I70.0). Bones osteoporotic. Probable chronic rotator cuff tears bilaterally, characterized by superior migration of each humeral head. Electronically Signed   By: Lowella Grip III M.D.   On: 03/27/2020 15:04   DG Hip Port Unilat W or Wo Pelvis 1 View Right  Result Date: 03/27/2020 CLINICAL DATA:  Recent hip surgery, multiple falls EXAM: DG HIP (WITH OR WITHOUT PELVIS) 1V PORT RIGHT COMPARISON:  12/21/2018 FINDINGS: Frontal view of the pelvis as well as frontal view of the right hip are obtained. Since the prior exam, intramedullary rod with proximal dynamic and distal interlocking screw has been placed across the previously seen intertrochanteric right hip fracture. There has been bony resorption of the femoral head, with migration of the dynamic screw now resulting in acetabular roof erosion. Residual fracture line can be seen along the remaining portion of the femoral neck consistent with nonunion. No acute bony abnormality.  Stable left hip osteoarthritis. IMPRESSION: 1. 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 Bony resorption of the majority of the right femoral head, with bony erosion of the acetabular roof by the dynamic screw. 2. Stable left hip osteoarthritis. Electronically Signed   By: Randa Ngo M.D.   On: 03/27/2020 15:29        Scheduled Meds: . azelastine  2 spray Each Nare Daily  . demeclocycline  150 mg Oral BID    . dextromethorphan-guaiFENesin  1 tablet Oral BID  . fluticasone  2 spray Each Nare Daily  . furosemide  20 mg Intravenous Q12H  . heparin  5,000 Units Subcutaneous Q8H  . loratadine  10 mg Oral Daily  . pantoprazole  40 mg Oral Q0600   Continuous Infusions: . ceFEPime (MAXIPIME) IV 1 g (03/29/20 1053)  . doxycycline (VIBRAMYCIN) IV 100 mg (03/29/20 0809)     LOS: 2 days    Time spent: 35 minutes    Irine Seal, MD [facility]   To contact the attending provider between 7A-7P or the covering provider during after hours 7P-7A, please log into the web site [email or web address] and access using universal [place] password for that web site. If you do not have the password, please call the hospital operator.  03/29/2020, 11:29 AM

## 2020-03-29 NOTE — Plan of Care (Signed)
  Problem: Education: Goal: Knowledge of General Education information will improve Description: Including pain rating scale, medication(s)/side effects and non-pharmacologic comfort measures Outcome: Progressing   Problem: Health Behavior/Discharge Planning: Goal: Ability to manage health-related needs will improve Outcome: Progressing   Problem: Clinical Measurements: Goal: Ability to maintain clinical measurements within normal limits will improve Outcome: Progressing Goal: Respiratory complications will improve Outcome: Progressing   Problem: Nutrition: Goal: Adequate nutrition will be maintained Outcome: Progressing   Problem: Elimination: Goal: Will not experience complications related to bowel motility Outcome: Progressing Goal: Will not experience complications related to urinary retention Outcome: Progressing   Problem: Pain Managment: Goal: General experience of comfort will improve Outcome: Progressing   Problem: Safety: Goal: Ability to remain free from injury will improve Outcome: Progressing   Problem: Skin Integrity: Goal: Risk for impaired skin integrity will decrease Outcome: Progressing

## 2020-03-30 DIAGNOSIS — Z66 Do not resuscitate: Secondary | ICD-10-CM

## 2020-03-30 DIAGNOSIS — Z515 Encounter for palliative care: Secondary | ICD-10-CM

## 2020-03-30 DIAGNOSIS — Z7189 Other specified counseling: Secondary | ICD-10-CM

## 2020-03-30 LAB — CBC
HCT: 38.5 % (ref 36.0–46.0)
Hemoglobin: 12.3 g/dL (ref 12.0–15.0)
MCH: 31 pg (ref 26.0–34.0)
MCHC: 31.9 g/dL (ref 30.0–36.0)
MCV: 97 fL (ref 80.0–100.0)
Platelets: 148 10*3/uL — ABNORMAL LOW (ref 150–400)
RBC: 3.97 MIL/uL (ref 3.87–5.11)
RDW: 15.3 % (ref 11.5–15.5)
WBC: 10.9 10*3/uL — ABNORMAL HIGH (ref 4.0–10.5)
nRBC: 0.2 % (ref 0.0–0.2)

## 2020-03-30 LAB — GLUCOSE, CAPILLARY
Glucose-Capillary: 102 mg/dL — ABNORMAL HIGH (ref 70–99)
Glucose-Capillary: 112 mg/dL — ABNORMAL HIGH (ref 70–99)
Glucose-Capillary: 134 mg/dL — ABNORMAL HIGH (ref 70–99)
Glucose-Capillary: 138 mg/dL — ABNORMAL HIGH (ref 70–99)
Glucose-Capillary: 70 mg/dL (ref 70–99)
Glucose-Capillary: 79 mg/dL (ref 70–99)
Glucose-Capillary: 94 mg/dL (ref 70–99)

## 2020-03-30 LAB — BASIC METABOLIC PANEL
Anion gap: 13 (ref 5–15)
Anion gap: 9 (ref 5–15)
BUN: 30 mg/dL — ABNORMAL HIGH (ref 8–23)
BUN: 28 mg/dL — ABNORMAL HIGH (ref 8–23)
CO2: 32 mmol/L (ref 22–32)
CO2: 29 mmol/L (ref 22–32)
Calcium: 7.9 mg/dL — ABNORMAL LOW (ref 8.9–10.3)
Calcium: 7.4 mg/dL — ABNORMAL LOW (ref 8.9–10.3)
Chloride: 106 mmol/L (ref 98–111)
Chloride: 107 mmol/L (ref 98–111)
Creatinine, Ser: 1.14 mg/dL — ABNORMAL HIGH (ref 0.44–1.00)
Creatinine, Ser: 1.04 mg/dL — ABNORMAL HIGH (ref 0.44–1.00)
GFR calc Af Amer: 47 mL/min — ABNORMAL LOW (ref >60)
GFR calc Af Amer: 53 mL/min — ABNORMAL LOW (ref >60)
GFR calc non Af Amer: 41 mL/min — ABNORMAL LOW (ref >60)
GFR calc non Af Amer: 46 mL/min — ABNORMAL LOW (ref >60)
Glucose, Bld: 67 mg/dL — ABNORMAL LOW (ref 70–99)
Glucose, Bld: 137 mg/dL — ABNORMAL HIGH (ref 70–99)
Potassium: 2.9 mmol/L — ABNORMAL LOW (ref 3.5–5.1)
Potassium: 4.2 mmol/L (ref 3.5–5.1)
Sodium: 151 mmol/L — ABNORMAL HIGH (ref 135–145)
Sodium: 145 mmol/L (ref 135–145)

## 2020-03-30 MED ORDER — SODIUM CHLORIDE 0.9 % IV SOLN
1.0000 g | Freq: Once | INTRAVENOUS | Status: AC
  Administered 2020-03-30: 1 g via INTRAVENOUS
  Filled 2020-03-30: qty 1

## 2020-03-30 MED ORDER — POTASSIUM CHLORIDE CRYS ER 20 MEQ PO TBCR
40.0000 meq | EXTENDED_RELEASE_TABLET | Freq: Once | ORAL | Status: AC
  Administered 2020-03-30: 40 meq via ORAL
  Filled 2020-03-30: qty 2

## 2020-03-30 MED ORDER — SODIUM CHLORIDE 0.9 % IV SOLN
2.0000 g | INTRAVENOUS | Status: DC
  Administered 2020-03-31 – 2020-04-03 (×4): 2 g via INTRAVENOUS
  Filled 2020-03-30 (×4): qty 2

## 2020-03-30 MED ORDER — DEXTROSE 5 % IV SOLN: INTRAVENOUS | Status: AC

## 2020-03-30 NOTE — Progress Notes (Signed)
Pharmacy Antibiotic Note  [name] is a 86 y.o. female admitted on 03/27/2020 with HCAP and UTI.  Pharmacy has been consulted for cefepime dosing.  Today, patient's temperature has been trending up, Tmax 100.1. WBC remain elevated but trending down.   Plan: Increase cefepime to 2 g IV q 24 h for pneumonia Monitor clinical progress and renal function F/U C&S, abx deescalation / LOT    Height: 4\' 6"  (137.2 cm) Weight: 59.4 kg (130 lb 15.3 oz) IBW/kg (Calculated) : 31.7  Temp (24hrs), Avg:99.1 F (37.3 C), Min:98.5 F (36.9 C), Max:100.1 F (37.8 C)  Recent Labs  Lab 03/27/20 0000 03/27/20 1436 03/27/20 1929 03/28/20 0140 03/29/20 0140 03/29/20 1506 03/30/20 0259  WBC 9.2 15.3*  --  14.6* 12.3*  --  10.9*  CREATININE 3.0* 2.29*  --  1.73*  --  1.26* 1.14*  LATICACIDVEN  --   --  0.9 1.0  --   --   --     Estimated Creatinine Clearance: 19.9 mL/min (A) (by C-G formula based on SCr of 1.14 mg/dL (H)).    Allergies  Allergen Reactions   Amlodipine Swelling    To feet and ankles    Avelox [Moxifloxacin Hcl In Nacl] Other (See Comments)    Pt does not remember    Biaxin [Clarithromycin]    Ceftin [Cefuroxime Axetil]    Duragesic Disc Transdermal System [Fentanyl] Other (See Comments)    Reaction unknown   Forteo [Parathyroid Hormone (Recomb)] Other (See Comments)    Made skin dry   Morphine And Related Other (See Comments)    "My Sister and daughter can,t take it. I've had it before without problems."   Teriparatide    Lidocaine Rash    Antimicrobials this admission: 6/9 ceftriaxone x 1 6/9 doxycycline >>  6/10 cefepime>>  Microbiology results: 6/9 BCx: ngtd 6/9 UCx: multiple species- recollect   Brendolyn Patty, PharmD PGY2 Pharmacy Resident Phone [phone]  03/30/2020   10:55 AM

## 2020-03-30 NOTE — Progress Notes (Signed)
Obtained Mattress replacement for pt, Bilateral Prevalon boots and pressure redistribution pad to assist support pt's declining nutritional status and frail skin. Noted skin tear left coccyx area measuring 3.0 x 2.0 cm, applied xeroform and covered with foam. Right heel there is a purple deep tissue pressure injury measuring 1 x 1 cm, will utilize the prevalon boots to keep the heels from having pressure on them.  On the left lower leg a rectangular foam was placed over 3 scabbed areas, one which has 1 steri-strip over it.  On the right lower leg, there is a 3x2 cm bruise which we placed a foam over it to protect it. Reinforced with NTs to use 1 dermatherapy pad under sacral area only and use gentle cleansing. Will continue to monitor and support.

## 2020-03-30 NOTE — Progress Notes (Signed)
PROGRESS NOTE    [name]  INO:[id number] DOB: 20-Aug-1923 DOA: 03/27/2020 PCP: [name], MD    Chief Complaint  Patient presents with  . AMS    Brief Narrative:  HPI per Dr. Ina Kick is a 86 y.o. female with PMH of right hip fracture s/p fixation March 2020,Takotsubo cardiomyopathy, aortic stenosis and mild dementia who presented to ED with AMS and admitted for CAP and UTI.  Patient non-verbal currently. All history provided by patient's son. [name], [name], reports that patient lives in a nursing home after her physical decline following hip fracture. At baseline, she walks with a walker and converses normally. Reports only very mild dementia that is usually not noticeable. He went to visit her on Sunday and noticed that she was not quite as mentally clear and her speech was a little different. His sister saw her on Monday and noted similar findings. Yesterday they obtained imaging which was c/w PNA and patient was started on Doxycyline. [name] went to see her today and noticed a marked decline. She was completely disoriented, not speaking and not walking. The NP at the nursing home obtained labs and was concerned for dehydration and gave IVF. They then recommended she come to the ER.   In the ED: Vitals stable initially on room air and then RN placed on 1.5 L O2 after desat to 88%. Labs remarkable for COVID neg, glucose 56, Cr 2.29, WBC 15.3, BNP 244, Trop 10>12, UA with large leuks and > 50 WBCs. Lactate 0.9.   CXR: Airspace consolidation consistent with pneumonia left base with small left pleural effusion. Lungs elsewhere clear. Stable cardiac Prominence.  XR Hip: 1. 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 Bony resorption of the majority of the right femoral head, with bony erosion of the acetabular roof by the dynamic screw. 2. Stable left hip osteoarthritis.  Patient was given IVF, Doxy and  Rocephin and called for admission.   Assessment & Plan:   Principal Problem:   Acute encephalopathy Active Problems:   SIADH (syndrome of inappropriate ADH production) (HCC)   Edema of extremities   Protein-calorie malnutrition, severe   Depression due to dementia (HCC)   CKD (chronic kidney disease) stage 4, GFR 15-29 ml/min (HCC)   Diastolic dysfunction   Pleural effusion   Acute kidney injury superimposed on CKD (HCC)   Hypoxemia   CAP (community acquired pneumonia)   Cystitis   Pressure injury of skin   Acute on chronic diastolic CHF (congestive heart failure) (HCC)   Acute cystitis with hematuria   HCAP (healthcare-associated pneumonia)   Hypokalemia   Hypernatremia  1 acute toxic metabolic encephalopathy Likely multifactorial secondary to healthcare associated pneumonia (from SNF), UTI, episodes of hypoglycemia.  Patient noted to have 2 episodes of hypoglycemia with CBGs of 41(03/28/2020).  CT head done negative for any acute abnormalities.  CBGs improving.  Patient seen by speech therapy and patient on a dysphagia 2 diet.  Urine Legionella antigen pending.  Urine pneumococcus antigen negative.  Continue IV cefepime, IV doxycycline.  Continue D5W.  If CBGs remained stable over the next 24 hours could likely discontinue D5.  Patient noted to be volume overloaded on examination and subsequently placed on IV Lasix with good diuresis.  Continue supportive care.  Follow.   2.  HCAP/acute respiratory failure with hypoxia/chronic pleural effusion Noted on imaging.  Patient noted to have an allergy to azithromycin.  Urine Legionella antigen pending.  Urine  pneumococcus antigen negative.  Blood cultures pending with no growth to date.  Lactate within normal limits.  As patient from a nursing facility IV Rocephin was changed to IV cefepime.  Continue IV doxycycline, Mucinex, Flonase, Claritin, PPI.  Follow.   3.  Acute renal failure on chronic kidney disease stage 4 Baseline creatinine  1.3.  Creatinine on admission was 2.29 on admission.  Patient noted to have received IV fluids in the nursing home and in the ED.  Patient volume overloaded on examination.  Patient noted to have poor oral intake.  Creatinine improved with diuresis.  Creatinine currently at 1.14.  IV Lasix has been completed.  Continue to hold home dose oral torsemide for now as patient with worsening hypernatremia.  Continue strict I's and O's.  Daily weights.  Follow.    4.  UTI Urine cultures with multiple species.  Follow.   5.  Acute on chronic diastolic heart failure Patient noted to have a grade 1 diastolic dysfunction per 2D echo 2019.  Patient volume overloaded on examination.  Cardiac enzymes negative.  BNP at 244.9 on admission.  2D echo pending.  Patient placed on Lasix 20 mg IV every 12 hours with a urine output of 1.4 L over the past 24 hours.  IV Lasix has been completed.  Patient now hypernatremic and as such we will hold off on resuming oral dose of torsemide today.  Strict I's and O's.  Daily weights.  Follow.   6.  Depression Continue to hold Wellbutrin until improvement with oral intake and tolerating a diet.  7.  Severe protein calorie malnutrition Nutrition consulted.  Magnesium at 2.2.  Phosphorus at 4.2.  Follow.  8.  Stage I pressure injury coccyx, POA Continue current wound care.  9.  SIADH Continue home regimen demeclocycline.  10.  Hypokalemia Repleted.  11.  Hypernatremia IV Lasix have been completed.  Increase D5W to 50 cc/h.  Continue to hold home dose torsemide.  Follow.    Pressure Injury 03/27/20 Coccyx Medial Stage 1 -  Intact skin with non-blanchable redness of a localized area usually over a bony prominence. (Active)  03/27/20 2300  Location: Coccyx  Location Orientation: Medial  Staging: Stage 1 -  Intact skin with non-blanchable redness of a localized area usually over a bony prominence.  Wound Description (Comments):   Present on Admission:            DVT prophylaxis: Heparin Code Status: DNR Family Communication: Updated daughter at bedside.  Disposition:   Status is: Inpatient    Dispo: The patient is from: SNF              Anticipated d/c is to: Likely back to SNF              Anticipated d/c date is: 2-3 days.              Patient currently on IV antibiotics, and acute metabolic encephalopathy, hypernatremic.currently not stable for discharge.        Consultants:   None  Procedures:   CT head without contrast 03/27/2020  Chest x-ray 03/27/2020  Plain films of right hip and pelvis 03/27/2020  Antimicrobials:   Cefepime 03/28/2020  IV doxycycline 03/27/2020>>>>> 04/01/2020  IV Rocephin 03/27/2020>>:>>> 03/28/2020   Subjective: Patient more alert today.  Following commands.  Very hard of hearing.  Daughter at bedside.   Objective: Vitals:   03/29/20 1510 03/29/20 1938 03/30/20 0440 03/30/20 0500  BP: (!) 126/55 (!) 120/59 (!) 149/59  Pulse: 65 66 70   Resp: 14 14 18    Temp: 675.9 F (37.8 C) 98.7 F (37.1 C) 98.5 F (36.9 C)   TempSrc: Axillary     SpO2: 100% 100% 100%   Weight:    59.4 kg  Height:        Intake/Output Summary (Last 24 hours) at 03/30/2020 1222 Last data filed at 03/30/2020 0520 Gross per 24 hour  Intake 957.18 ml  Output 1100 ml  Net -142.82 ml   Filed Weights   03/28/20 0856 03/29/20 0500 03/30/20 0500  Weight: 61.1 kg 61.1 kg 59.4 kg    Examination:  General exam: More alert. Respiratory system: Less rhonchorous/coarse breath sounds anterior lung fields.  Cardiovascular system: RRR no murmurs rubs or gallops.  Trace to 1+ bilateral pedal edema.  Gastrointestinal system: Abdomen is nontender, nondistended, soft, positive bowel sounds.  No rebound.  No guarding.    Central nervous system: Alert. No focal neurological deficits. Extremities: Trace to 1+ bilateral pedal edema.  Symmetric 5 x 5 power. Skin: No rashes, lesions or ulcers Psychiatry: Judgement and  insight appear poor-fair. Mood & affect unable to assess.     Data Reviewed: I have personally reviewed following labs and imaging studies  CBC: Recent Labs  Lab 03/27/20 0000 03/27/20 1436 03/28/20 0140 03/29/20 0140 03/30/20 0259  WBC 9.2 15.3* 14.6* 12.3* 10.9*  NEUTROABS 7,682 13.9*  --   --   --   HGB 12.1 11.9* 11.1* 11.7* 12.3  HCT 36 36.8 33.6* 36.0 38.5  MCV  --  96.3 94.9 96.0 97.0  PLT 203 188 166 154 148*    Basic Metabolic Panel: Recent Labs  Lab 03/27/20 0000 03/27/20 1436 03/28/20 0140 03/29/20 1506 03/30/20 0259  NA 138 142 145 147* 151*  K 4.7 4.2 4.0 3.3* 2.9*  CL 96* 103 107 108 106  CO2 31* 27 30 30  32  GLUCOSE  --  56* 54* 82 67*  BUN 92* 86* 68* 36* 30*  CREATININE 3.0* 2.29* 1.73* 1.26* 1.14*  CALCIUM 8.9 8.7* 8.3* 8.0* 7.9*  MG  --   --  2.2  --   --   PHOS  --   --  4.2  --   --     GFR: Estimated Creatinine Clearance: 19.9 mL/min (A) (by C-G formula based on SCr of 1.14 mg/dL (H)).  Liver Function Tests: Recent Labs  Lab 03/27/20 0000  AST 32  ALT 34  ALKPHOS 201*  ALBUMIN 2.9*    CBG: Recent Labs  Lab 03/29/20 2358 03/30/20 0443 03/30/20 0628 03/30/20 0806 03/30/20 1142  GLUCAP 82 70 94 79 112*     Recent Results (from the past 240 hour(s))  SARS Coronavirus 2 by RT PCR (hospital order, performed in [facility] hospital lab) Nasopharyngeal Nasopharyngeal Swab     Status: None   Collection Time: 03/27/20  2:27 PM   Specimen: Nasopharyngeal Swab  Result Value Ref Range Status   SARS Coronavirus 2 NEGATIVE NEGATIVE Final    Comment: (NOTE) SARS-CoV-2 target nucleic acids are NOT DETECTED. The SARS-CoV-2 RNA is generally detectable in upper and lower respiratory specimens during the acute phase of infection. The lowest concentration of SARS-CoV-2 viral copies this assay can detect is 250 copies / mL. A negative result does not preclude SARS-CoV-2 infection and should not be used as the sole basis for treatment or  other patient management decisions.  A negative result may occur with improper specimen collection / handling, submission  of specimen other than nasopharyngeal swab, presence of viral mutation(s) within the areas targeted by this assay, and inadequate number of viral copies (<250 copies / mL). A negative result must be combined with clinical observations, patient history, and epidemiological information. Fact Sheet for Patients:   StrictlyIdeas.no Fact Sheet for Healthcare Providers: BankingDealers.co.za This test is not yet approved or cleared  by the [place] FDA and has been authorized for detection and/or diagnosis of SARS-CoV-2 by FDA under an Emergency Use Authorization (EUA).  This EUA will remain in effect (meaning this test can be used) for the duration of the COVID-19 declaration under Section 564(b)(1) of the Act, 21 U.S.C. section 360bbb-3(b)(1), unless the authorization is terminated or revoked sooner. Performed at [facility], [place] [address], [place], [place] [address]   Urine culture     Status: Abnormal   Collection Time: 03/27/20  5:06 PM   Specimen: Urine, Random  Result Value Ref Range Status   Specimen Description URINE, RANDOM  Final   Special Requests   Final    NONE Performed at [facility], [place] [address] [place], [place] [address]    Culture MULTIPLE SPECIES PRESENT, SUGGEST RECOLLECTION (A)  Final   Report Status 03/28/2020 FINAL  Final  Culture, blood (routine x 2)     Status: None (Preliminary result)   Collection Time: 03/27/20  7:30 PM   Specimen: BLOOD RIGHT HAND  Result Value Ref Range Status   Specimen Description BLOOD RIGHT HAND  Final   Special Requests   Final    BOTTLES DRAWN AEROBIC AND ANAEROBIC Blood Culture adequate volume   Culture   Final    NO GROWTH 3 DAYS Performed at [facility], [place] [address] [place], [place] [address]    Report Status PENDING   Incomplete  Culture, blood (routine x 2)     Status: None (Preliminary result)   Collection Time: 03/27/20  7:30 PM   Specimen: BLOOD  Result Value Ref Range Status   Specimen Description BLOOD RIGHT ANTECUBITAL  Final   Special Requests   Final    BOTTLES DRAWN AEROBIC AND ANAEROBIC Blood Culture adequate volume   Culture   Final    NO GROWTH 3 DAYS Performed at [facility], [place] [address] [place],  [address]    Report Status PENDING  Incomplete         Radiology Studies: No results found.      Scheduled Meds: . azelastine  2 spray Each Nare Daily  . demeclocycline  150 mg Oral BID  . dextromethorphan-guaiFENesin  1 tablet Oral BID  . feeding supplement (PRO-STAT SUGAR FREE 64)  30 mL Oral BID  . fluticasone  2 spray Each Nare Daily  . heparin  5,000 Units Subcutaneous Q8H  . loratadine  10 mg Oral Daily  . multivitamin with minerals  1 tablet Oral Q1200  . pantoprazole  40 mg Oral Q0600   Continuous Infusions: . ceFEPime (MAXIPIME) IV    . [START ON 03/31/2020] ceFEPime (MAXIPIME) IV    . dextrose 50 mL/hr at 03/30/20 1011  . doxycycline (VIBRAMYCIN) IV Stopped (03/29/20 2205)     LOS: 3 days    Time spent: 35 minutes    Cheryl Seal, MD [facility]   To contact the attending provider between 7A-7P or the covering provider during after hours 7P-7A, please log into the web site [email or web address] and access using universal [place] password for that  web site. If you do not have the password, please call the hospital operator.  03/30/2020, 12:22 PM

## 2020-03-30 NOTE — Consult Note (Signed)
Palliative Medicine Inpatient Consult Note  Reason for consult:  Goals of Care  HPI:  Per intake H&P --> [name] is a 86 y.o. female with PMH of right hip fracture s/p fixation March 2020,Takotsubo cardiomyopathy, aortic stenosis and mild dementia who presented to ED with AMS and admitted for CAP and UTI.  Ongoing goals of care conversations in the setting of recurrent hospitalizations and clinical decline.   Clinical Assessment/Goals of Care: I have reviewed medical records including EPIC notes, labs and imaging, received report from bedside RN, assessed the patient. I met with [name] at bedside she was able to open her eyes though otherwise was quite disoriented.    I called [name] to further discuss diagnosis prognosis, GOC, EOL wishes, disposition and options.   I introduced Palliative Medicine as specialized medical care for people living with serious illness. It focuses on providing relief from the symptoms and stress of a serious illness. The goal is to improve quality of life for both the patient and the family.  I asked [name] to tell me about his mother. He shares that she is from [place] originally. She attended [employer] which is where she met he husband. She is a widow as her husband passed about two and half years ago. She has four children, three daughters and one son. She enjoys her family and spending time with friends. She use to love eating. She is a woman of faith and practices within the [facility].   Per [name] was doing fairly well until last March when she began to decline. She suffered a fall and his fracture for which she was able to recover fairly well though when she went back to her ALF she suffered additional falls.   Over the past six months [name] has declined. She is now not eating or drink sufficiently. Per [name] this is a significant difference. She has become reliant on [facility] for additional  assistance with all bADLs. Since then she has been more distraught per her son and feels there is not much to look forward to.   A detailed discussion was had today regarding advanced directives, they have been filled out and can be found on [employer].   Concepts specific to code status, artifical feeding and hydration, continued IV antibiotics and rehospitalization was had.  [name] had been clear about her wishes with her family to be a DNAR/DNI. She would not wish for heroic measures to sustain life.   The difference between a aggressive medical intervention path  and a palliative comfort care path for this patient at this time was had. The topic of hospice was broached. [name] shares that he is very familiar with hospice as their father died on hospice. We discussed that presently [name] is stable though if she should decline it would be prudent to consider a shift from curative treatment to symptom relief. [name] agrees with this and does feel that if this were to happen it would align with what [name] would want for herself.   Discussed the importance of continued conversation with family and their  medical providers regarding overall plan of care and treatment options, ensuring decisions are within the context of the patients values and GOCs.  Decision Maker: [name] (son) - [phone]  SUMMARY OF RECOMMENDATIONS   DNAR/DNI  Advanced Directive on File  MOST form introduced will try to complete prior to discharge  Plan to meet with [name] on Monday in the late afternoon  If  patient improved the plan would be to transition to [facility] nursing with OP Palliative support. If she declines we would discuss transition to comfort and possibly transition to [place] on hospice.   Nutrition consult ordered for FTT  Chaplain consulted for prayer  Code Status/Advance Care Planning: DNAR/DNI   Palliative Prophylaxis:   Constipation, Turn Q2H, Pain  Additional  Recommendations (Limitations, Scope, Preferences):  Treat the treatable   Psycho-social/Spiritual:   Desire for further Chaplaincy support: Yes  Additional Recommendations: Education on hospice   Prognosis: Would depend upon patient ability to rehab.    Discharge Planning: Likely back to [facility] skilled nursing  PPS: 20%   This conversation/these recommendations were discussed with patient primary care team, Dr. Grandville Silos  Time In: 1645 Time Out: 1800 Total Time: 75 Greater than 50%  of this time was spent counseling and coordinating care related to the above assessment and plan.  [place] Team Team Cell Phone: [phone] Please utilize secure chat with additional questions, if there is no response within 30 minutes please call the above phone number  Palliative Medicine Team providers are available by phone from 7am to 7pm daily and can be reached through the team cell phone.  Should this patient require assistance outside of these hours, please call the patient's attending physician.

## 2020-03-31 LAB — BASIC METABOLIC PANEL WITH GFR
Anion gap: 9 (ref 5–15)
BUN: 25 mg/dL — ABNORMAL HIGH (ref 8–23)
CO2: 31 mmol/L (ref 22–32)
Calcium: 8.4 mg/dL — ABNORMAL LOW (ref 8.9–10.3)
Chloride: 108 mmol/L (ref 98–111)
Creatinine, Ser: 0.99 mg/dL (ref 0.44–1.00)
GFR calc Af Amer: 56 mL/min — ABNORMAL LOW
GFR calc non Af Amer: 48 mL/min — ABNORMAL LOW
Glucose, Bld: 96 mg/dL (ref 70–99)
Potassium: 3.5 mmol/L (ref 3.5–5.1)
Sodium: 148 mmol/L — ABNORMAL HIGH (ref 135–145)

## 2020-03-31 LAB — BASIC METABOLIC PANEL
Anion gap: 8 (ref 5–15)
BUN: 23 mg/dL (ref 8–23)
CO2: 28 mmol/L (ref 22–32)
Calcium: 8 mg/dL — ABNORMAL LOW (ref 8.9–10.3)
Chloride: 109 mmol/L (ref 98–111)
Creatinine, Ser: 0.84 mg/dL (ref 0.44–1.00)
GFR calc Af Amer: >60 mL/min (ref >60)
GFR calc non Af Amer: 59 mL/min — ABNORMAL LOW (ref >60)
Glucose, Bld: 102 mg/dL — ABNORMAL HIGH (ref 70–99)
Potassium: 3.6 mmol/L (ref 3.5–5.1)
Sodium: 145 mmol/L (ref 135–145)

## 2020-03-31 LAB — GLUCOSE, CAPILLARY
Glucose-Capillary: 68 mg/dL — ABNORMAL LOW (ref 70–99)
Glucose-Capillary: 72 mg/dL (ref 70–99)
Glucose-Capillary: 73 mg/dL (ref 70–99)
Glucose-Capillary: 75 mg/dL (ref 70–99)
Glucose-Capillary: 83 mg/dL (ref 70–99)
Glucose-Capillary: 92 mg/dL (ref 70–99)

## 2020-03-31 LAB — CBC
HCT: 40.4 % (ref 36.0–46.0)
Hemoglobin: 12.9 g/dL (ref 12.0–15.0)
MCH: 30.9 pg (ref 26.0–34.0)
MCHC: 31.9 g/dL (ref 30.0–36.0)
MCV: 96.9 fL (ref 80.0–100.0)
Platelets: 136 K/uL — ABNORMAL LOW (ref 150–400)
RBC: 4.17 MIL/uL (ref 3.87–5.11)
RDW: 15.4 % (ref 11.5–15.5)
WBC: 11.1 K/uL — ABNORMAL HIGH (ref 4.0–10.5)
nRBC: 0.2 % (ref 0.0–0.2)

## 2020-03-31 MED ORDER — PREGABALIN 25 MG PO CAPS
25.0000 mg | ORAL_CAPSULE | Freq: Two times a day (BID) | ORAL | Status: DC
  Administered 2020-03-31 – 2020-04-03 (×7): 25 mg via ORAL
  Filled 2020-03-31 (×7): qty 1

## 2020-03-31 MED ORDER — PANTOPRAZOLE SODIUM 40 MG PO TBEC: 40.0000 mg | DELAYED_RELEASE_TABLET | Freq: Every day | ORAL | Status: DC

## 2020-03-31 MED ORDER — ACETAMINOPHEN 500 MG PO TABS: 500.0000 mg | ORAL_TABLET | Freq: Four times a day (QID) | ORAL | Status: DC | PRN

## 2020-03-31 MED ORDER — POTASSIUM CHLORIDE CRYS ER 20 MEQ PO TBCR
40.0000 meq | EXTENDED_RELEASE_TABLET | Freq: Once | ORAL | Status: AC
  Administered 2020-03-31: 40 meq via ORAL
  Filled 2020-03-31: qty 2

## 2020-03-31 MED ORDER — FAMOTIDINE 20 MG PO TABS
10.0000 mg | ORAL_TABLET | Freq: Every day | ORAL | Status: DC
  Administered 2020-03-31 – 2020-04-03 (×4): 10 mg via ORAL
  Filled 2020-03-31 (×4): qty 1

## 2020-03-31 MED ORDER — DEXTROSE 50 % IV SOLN
25.0000 mL | Freq: Once | INTRAVENOUS | Status: AC
  Administered 2020-04-01: 25 mL via INTRAVENOUS
  Filled 2020-03-31: qty 50

## 2020-03-31 MED ORDER — ACETAMINOPHEN 325 MG PO TABS
650.0000 mg | ORAL_TABLET | Freq: Two times a day (BID) | ORAL | Status: DC
  Administered 2020-03-31 – 2020-04-03 (×7): 650 mg via ORAL
  Filled 2020-03-31 (×7): qty 2

## 2020-03-31 NOTE — Progress Notes (Signed)
   Palliative Medicine Inpatient Follow Up Note  Reason for consult:  Goals of Care  HPI:  Per intake H&P --> Cheryl N Christiansenis a 86 y.o.femalewith PMH ofright hip fracture s/p fixation March 2020,Takotsubo cardiomyopathy, aortic stenosisand mild dementia who presented to ED with AMS and admitted for CAP and UTI.  Ongoing goals of care conversations in the setting of recurrent hospitalizations and clinical decline.   Today's Discussion (03/31/2020): I have reviewed medical records including EPIC notes, labs and imaging, received report from bedside RN, assessed the patient. Patient appears to be in no distress this morning.   Plan for meeting with patients son, [name] at bedside tomorrow afternoon. I was able to speak with patients daughter,  [name] this morning who shares that she and her family are hopeful for improvement but realistic about [name]'s present health state.   Discussed the importance of continued conversation with family and their  medical providers regarding overall plan of care and treatment options, ensuring decisions are within the context of the patients values and GOCs.  Provided "Hard Choices for [employer]" booklet.   Questions and concerns addressed   Vital Signs Vitals:   03/30/20 1958 03/31/20 0404  BP: (!) 135/52 138/62  Pulse: 60 66  Resp: 16 18  Temp: 98.3 F (36.8 C) 97.6 F (36.4 C)  SpO2: 100% 100%    Intake/Output Summary (Last 24 hours) at 03/31/2020 8502 Last data filed at 03/31/2020 0600 Gross per 24 hour  Intake 250 ml  Output 700 ml  Net -450 ml   Last Weight  Most recent update: 03/31/2020  4:11 AM   Weight  64.9 kg (143 lb)           Gen:  Elderly F in NAD HEENT: moist mucous membranes CV: Regular rate and rhythm  PULM: On RA non-labored breathing ABD: soft/nontender  EXT: No edema Neuro: Somnolent  SUMMARY OF RECOMMENDATIONS DNAR/DNI  Advanced Directive on File  MOST form introduced will try to  complete prior to discharge  Plan to meet with [name] on Monday in the late afternoon  TOC --> OP Palliative care  If patient improved the plan would be to transition to [facility] nursing with OP Palliative support. If she declines we would discuss transition to comfort and possibly transition to [name] on hospice.   Chaplain consulted for prayer  Time Spent: 25 Greater than 50% of the time was spent in counseling and coordination of care ______________________________________________________________________________________ [place] Team Team Cell Phone: [phone] Please utilize secure chat with additional questions, if there is no response within 30 minutes please call the above phone number  Palliative Medicine Team providers are available by phone from 7am to 7pm daily and can be reached through the team cell phone.  Should this patient require assistance outside of these hours, please call the patient's attending physician.

## 2020-03-31 NOTE — Plan of Care (Signed)
  Problem: Nutrition: Goal: Adequate nutrition will be maintained Outcome: Progressing   Problem: Elimination: Goal: Will not experience complications related to bowel motility Outcome: Progressing Goal: Will not experience complications related to urinary retention Outcome: Progressing   

## 2020-03-31 NOTE — Progress Notes (Signed)
PROGRESS NOTE    [name]  NWG:[id number] DOB: 11-03-1922 DOA: 03/27/2020 PCP: Cheryl Dad, MD    Chief Complaint  Patient presents with   AMS    Brief Narrative:  HPI per Dr. Ina Kick is a 86 y.o. female with PMH of right hip fracture s/p fixation March 2020,Takotsubo cardiomyopathy, aortic stenosis and mild dementia who presented to ED with AMS and admitted for CAP and UTI.  Patient non-verbal currently. All history provided by patient's son. [name], [name], reports that patient lives in a nursing home after her physical decline following hip fracture. At baseline, she walks with a walker and converses normally. Reports only very mild dementia that is usually not noticeable. He went to visit her on Sunday and noticed that she was not quite as mentally clear and her speech was a little different. His sister saw her on Monday and noted similar findings. Yesterday they obtained imaging which was c/w PNA and patient was started on Doxycyline. [name] went to see her today and noticed a marked decline. She was completely disoriented, not speaking and not walking. The NP at the nursing home obtained labs and was concerned for dehydration and gave IVF. They then recommended she come to the ER.   In the ED: Vitals stable initially on room air and then RN placed on 1.5 L O2 after desat to 88%. Labs remarkable for COVID neg, glucose 56, Cr 2.29, WBC 15.3, BNP 244, Trop 10>12, UA with large leuks and > 50 WBCs. Lactate 0.9.   CXR: Airspace consolidation consistent with pneumonia left base with small left pleural effusion. Lungs elsewhere clear. Stable cardiac Prominence.  XR Hip: 1. 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 Bony resorption of the majority of the right femoral head, with bony erosion of the acetabular roof by the dynamic screw. 2. Stable left hip osteoarthritis.  Patient was given IVF, Doxy and  Rocephin and called for admission.   Assessment & Plan:   Principal Problem:   Acute encephalopathy Active Problems:   SIADH (syndrome of inappropriate ADH production) (HCC)   Edema of extremities   Protein-calorie malnutrition, severe   Depression due to dementia (HCC)   CKD (chronic kidney disease) stage 4, GFR 15-29 ml/min (HCC)   Diastolic dysfunction   Pleural effusion   Acute kidney injury superimposed on CKD ([place])   Hypoxemia   CAP (community acquired pneumonia)   Cystitis   Pressure injury of skin   Acute on chronic diastolic CHF (congestive heart failure) ([place])   Acute cystitis with hematuria   HCAP (healthcare-associated pneumonia)   Hypokalemia   Hypernatremia   Palliative care by specialist   Goals of care, counseling/discussion   DNR (do not resuscitate)  1 acute toxic metabolic encephalopathy Likely multifactorial secondary to healthcare associated pneumonia (from SNF), UTI, episodes of hypoglycemia.  Patient noted to have 2 episodes of hypoglycemia with CBGs of 41(03/28/2020).  CT head done negative for any acute abnormalities.  CBGs improving.  Patient seen by speech therapy and patient on a dysphagia 2 diet.  Urine Legionella antigen pending.  Urine pneumococcus antigen negative.  CBG of 92 this morning.  Continue IV cefepime, IV doxycycline.  Continue D5W.  If CBGs remained stable over the next 24 hours could likely discontinue D5.  Patient noted to be volume overloaded on examination and subsequently placed on IV Lasix with good diuresis.  IV Lasix have been completed.  Patient improving clinically.  Continue  supportive care.  Follow.   2.  HCAP/acute respiratory failure with hypoxia/chronic pleural effusion Noted on imaging.  Patient noted to have an allergy to azithromycin.  Urine Legionella antigen pending.  Urine pneumococcus antigen negative.  Blood cultures pending with no growth to date.  Lactate within normal limits.  As patient from a nursing facility IV  Rocephin was changed to IV cefepime.  Continue IV doxycycline, IV cefepime, Mucinex, Flonase, Claritin, PPI.  Follow.   3.  Acute renal failure on chronic kidney disease stage 4 Baseline creatinine 1.3.  Creatinine on admission was 2.29 on admission.  Patient noted to have received IV fluids in the nursing home and in the ED.  Patient volume overloaded on examination.  Patient noted to have poor oral intake.  Creatinine improved with diuresis.  Creatinine currently at 0.99.  IV Lasix has been completed.  Continue to hold home dose oral torsemide for now as patient with worsening hypernatremia.  Continue strict I's and O's.  Daily weights.  Follow.    4.  UTI Urine cultures with multiple species.  Follow.   5.  Acute on chronic diastolic heart failure Patient noted to have a grade 1 diastolic dysfunction per 2D echo 2019.  Patient volume overloaded on examination.  Cardiac enzymes negative.  BNP at 244.9 on admission.  2D echo pending.  Patient placed on Lasix 20 mg IV every 12 hours.  Patient -1.488 L during this hospitalization. Patient now hypernatremic and as such we will hold off on resuming oral dose of torsemide today.  Gentle hydration with D5W.  Strict I's and O's.  Daily weights.  Follow.   6.  Depression Continue to hold Wellbutrin until improvement with oral intake and tolerating a diet.  Will likely resume Wellbutrin on discharge.  7.  Severe protein calorie malnutrition Nutrition consulted.  Magnesium at 2.2.  Phosphorus at 4.2.  Follow.  8.  Stage I pressure injury coccyx, POA Continue current wound care.  9.  SIADH Continue home regimen demeclocycline.  10.  Hypokalemia Repleted.  Repeat labs pending this morning.  11.  Hypernatremia IV Lasix have been completed.  Continue D5W.  Continue to hold home dose torsemide.  Follow.    Pressure Injury 03/27/20 Coccyx Medial Stage 1 -  Intact skin with non-blanchable redness of a localized area usually over a bony prominence.  (Active)  03/27/20 2300  Location: Coccyx  Location Orientation: Medial  Staging: Stage 1 -  Intact skin with non-blanchable redness of a localized area usually over a bony prominence.  Wound Description (Comments):   Present on Admission:      Pressure Injury 03/30/20 Heel Right Deep Tissue Pressure Injury - Purple or maroon localized area of discolored intact skin or blood-filled blister due to damage of underlying soft tissue from pressure and/or shear. (Active)  03/30/20 0400  Location: Heel  Location Orientation: Right  Staging: Deep Tissue Pressure Injury - Purple or maroon localized area of discolored intact skin or blood-filled blister due to damage of underlying soft tissue from pressure and/or shear.  Wound Description (Comments):   Present on Admission:      Pressure Injury 03/30/20 Coccyx Left;Medial Stage 2 -  Partial thickness loss of dermis presenting as a shallow open injury with a red, pink wound bed without slough. Skin tear (Active)  03/30/20 1630  Location: Coccyx  Location Orientation: Left;Medial  Staging: Stage 2 -  Partial thickness loss of dermis presenting as a shallow open injury with a red, pink wound  bed without slough.  Wound Description (Comments): Skin tear  Present on Admission: No          DVT prophylaxis: Heparin Code Status: DNR Family Communication: Updated daughter at bedside.  Disposition:   Status is: Inpatient    Dispo: The patient is from: SNF              Anticipated d/c is to: Likely back to SNF              Anticipated d/c date is: 1-2 days.              Patient currently on IV antibiotics, and acute metabolic encephalopathy, hypernatremic.currently not stable for discharge.        Consultants:   None  Procedures:   CT head without contrast 03/27/2020  Chest x-ray 03/27/2020  Plain films of right hip and pelvis 03/27/2020  Antimicrobials:   Cefepime 03/28/2020  IV doxycycline 03/27/2020>>>>> 04/01/2020  IV Rocephin  03/27/2020>>:>>> 03/28/2020   Subjective: Patient more alert today.  Following some commands.  Answering some questions.  Denies any chest pain.  Denies any abdominal pain.  Some improvement with shortness of breath.  Very hard of hearing.  Daughter at bedside.   Objective: Vitals:   03/30/20 0500 03/30/20 1314 03/30/20 1958 03/31/20 0404  BP:  139/62 (!) 135/52 138/62  Pulse:  69 60 66  Resp:  17 16 18   Temp:  98.8 F (37.1 C) 98.3 F (36.8 C) 97.6 F (36.4 C)  TempSrc:  Oral Axillary Oral  SpO2:  100% 100% 100%  Weight: 59.4 kg   64.9 kg  Height:        Intake/Output Summary (Last 24 hours) at 03/31/2020 1249 Last data filed at 03/31/2020 0600 Gross per 24 hour  Intake 250 ml  Output 700 ml  Net -450 ml   Filed Weights   03/29/20 0500 03/30/20 0500 03/31/20 0404  Weight: 61.1 kg 59.4 kg 64.9 kg    Examination:  General exam: More alert. Respiratory system: Less rhonchorous breath sounds anterior lung fields.  Cardiovascular system: Regular rate rhythm no murmurs rubs or gallops.  Trace bilateral pedal edema.  Gastrointestinal system: Abdomen is soft, nontender, nondistended, positive bowel sounds.  No rebound.  No guarding Central nervous system: Alert. No focal neurological deficits. Extremities: Trace bilateral pedal edema.  Lower extremities and heel floaters.  Symmetric 5 x 5 power. Skin: No rashes, lesions or ulcers Psychiatry: Judgement and insight appear poor-fair. Mood & affect unable to assess.     Data Reviewed: I have personally reviewed following labs and imaging studies  CBC: Recent Labs  Lab 03/27/20 0000 03/27/20 0000 03/27/20 1436 03/28/20 0140 03/29/20 0140 03/30/20 0259 03/31/20 0256  WBC 9.2  --  15.3* 14.6* 12.3* 10.9* 11.1*  NEUTROABS 7,682  --  13.9*  --   --   --   --   HGB 12.1   < > 11.9* 11.1* 11.7* 12.3 12.9  HCT 36   < > 36.8 33.6* 36.0 38.5 40.4  MCV  --   --  96.3 94.9 96.0 97.0 96.9  PLT 203   < > 188 166 154 148* 136*   < >  = values in this interval not displayed.    Basic Metabolic Panel: Recent Labs  Lab 03/28/20 0140 03/29/20 1506 03/30/20 0259 03/30/20 1300 03/31/20 0256  NA 145 147* 151* 145 148*  K 4.0 3.3* 2.9* 4.2 3.5  CL 107 108 106 107 108  CO2 30  30 32 29 31  GLUCOSE 54* 82 67* 137* 96  BUN 68* 36* 30* 28* 25*  CREATININE 1.73* 1.26* 1.14* 1.04* 0.99  CALCIUM 8.3* 8.0* 7.9* 7.4* 8.4*  MG 2.2  --   --   --   --   PHOS 4.2  --   --   --   --     GFR: Estimated Creatinine Clearance: 24.1 mL/min (by C-G formula based on SCr of 0.99 mg/dL).  Liver Function Tests: Recent Labs  Lab 03/27/20 0000  AST 32  ALT 34  ALKPHOS 201*  ALBUMIN 2.9*    CBG: Recent Labs  Lab 03/30/20 1952 03/30/20 2334 03/31/20 0402 03/31/20 0749 03/31/20 1246  GLUCAP 134* 102* 92 73 75     Recent Results (from the past 240 hour(s))  SARS Coronavirus 2 by RT PCR (hospital order, performed in [facility] hospital lab) Nasopharyngeal Nasopharyngeal Swab     Status: None   Collection Time: 03/27/20  2:27 PM   Specimen: Nasopharyngeal Swab  Result Value Ref Range Status   SARS Coronavirus 2 NEGATIVE NEGATIVE Final    Comment: (NOTE) SARS-CoV-2 target nucleic acids are NOT DETECTED. The SARS-CoV-2 RNA is generally detectable in upper and lower respiratory specimens during the acute phase of infection. The lowest concentration of SARS-CoV-2 viral copies this assay can detect is 250 copies / mL. A negative result does not preclude SARS-CoV-2 infection and should not be used as the sole basis for treatment or other patient management decisions.  A negative result may occur with improper specimen collection / handling, submission of specimen other than nasopharyngeal swab, presence of viral mutation(s) within the areas targeted by this assay, and inadequate number of viral copies (<250 copies / mL). A negative result must be combined with clinical observations, patient history, and epidemiological  information. Fact Sheet for Patients:   StrictlyIdeas.no Fact Sheet for Healthcare Providers: BankingDealers.co.za This test is not yet approved or cleared  by the [place] FDA and has been authorized for detection and/or diagnosis of SARS-CoV-2 by FDA under an Emergency Use Authorization (EUA).  This EUA will remain in effect (meaning this test can be used) for the duration of the COVID-19 declaration under Section 564(b)(1) of the Act, 21 U.S.C. section 360bbb-3(b)(1), unless the authorization is terminated or revoked sooner. Performed at [facility], [place] [address], [place], [place] [address]   Urine culture     Status: Abnormal   Collection Time: 03/27/20  5:06 PM   Specimen: Urine, Random  Result Value Ref Range Status   Specimen Description URINE, RANDOM  Final   Special Requests   Final    NONE Performed at [facility], [place] [address], [place], [place] [address]    Culture MULTIPLE SPECIES PRESENT, SUGGEST RECOLLECTION (A)  Final   Report Status 03/28/2020 FINAL  Final  Culture, blood (routine x 2)     Status: None (Preliminary result)   Collection Time: 03/27/20  7:30 PM   Specimen: BLOOD RIGHT HAND  Result Value Ref Range Status   Specimen Description BLOOD RIGHT HAND  Final   Special Requests   Final    BOTTLES DRAWN AEROBIC AND ANAEROBIC Blood Culture adequate volume   Culture   Final    NO GROWTH 4 DAYS Performed at [facility], [place] [address], [place], [place] [address]    Report Status PENDING  Incomplete  Culture, blood (routine x 2)     Status: None (Preliminary  result)   Collection Time: 03/27/20  7:30 PM   Specimen: BLOOD  Result Value Ref Range Status   Specimen Description BLOOD RIGHT ANTECUBITAL  Final   Special Requests   Final    BOTTLES DRAWN AEROBIC AND ANAEROBIC Blood Culture adequate volume   Culture   Final    NO GROWTH 4 DAYS Performed at [facility], [address] [place], [place] [address]    Report Status PENDING  Incomplete         Radiology Studies: No results found.      Scheduled Meds:  acetaminophen  650 mg Oral BID   azelastine  2 spray Each Nare Daily   demeclocycline  150 mg Oral BID   dextromethorphan-guaiFENesin  1 tablet Oral BID   famotidine  10 mg Oral Daily   feeding supplement (PRO-STAT SUGAR FREE 64)  30 mL Oral BID   fluticasone  2 spray Each Nare Daily   heparin  5,000 Units Subcutaneous Q8H   loratadine  10 mg Oral Daily   multivitamin with minerals  1 tablet Oral Q1200   pantoprazole  40 mg Oral Q0600   pregabalin  25 mg Oral BID   Continuous Infusions:  ceFEPime (MAXIPIME) IV 2 g (03/31/20 1002)   dextrose 50 mL/hr at 03/31/20 0939   doxycycline (VIBRAMYCIN) IV 100 mg (03/31/20 0755)     LOS: 4 days    Time spent: 35 minutes    Irine Seal, MD [facility]   To contact the attending provider between 7A-7P or the covering provider during after hours 7P-7A, please log into the web site [email or web address] and access using universal [place] password for that web site. If you do not have the password, please call the hospital operator.  03/31/2020, 12:49 PM

## 2020-04-01 ENCOUNTER — Inpatient Hospital Stay (HOSPITAL_COMMUNITY): Payer: Medicare PPO

## 2020-04-01 DIAGNOSIS — J90 Pleural effusion, not elsewhere classified: Secondary | ICD-10-CM

## 2020-04-01 DIAGNOSIS — G9340 Encephalopathy, unspecified: Secondary | ICD-10-CM

## 2020-04-01 DIAGNOSIS — J189 Pneumonia, unspecified organism: Principal | ICD-10-CM

## 2020-04-01 LAB — CULTURE, BLOOD (ROUTINE X 2)
Culture: NO GROWTH
Culture: NO GROWTH
Special Requests: ADEQUATE
Special Requests: ADEQUATE

## 2020-04-01 LAB — BASIC METABOLIC PANEL WITH GFR
Anion gap: 12 (ref 5–15)
BUN: 21 mg/dL (ref 8–23)
CO2: 28 mmol/L (ref 22–32)
Calcium: 8.3 mg/dL — ABNORMAL LOW (ref 8.9–10.3)
Chloride: 107 mmol/L (ref 98–111)
Creatinine, Ser: 0.82 mg/dL (ref 0.44–1.00)
GFR calc Af Amer: >60 mL/min (ref >60)
GFR calc non Af Amer: >60 mL/min (ref >60)
Glucose, Bld: 106 mg/dL — ABNORMAL HIGH (ref 70–99)
Potassium: 3.9 mmol/L (ref 3.5–5.1)
Sodium: 147 mmol/L — ABNORMAL HIGH (ref 135–145)

## 2020-04-01 LAB — GLUCOSE, CAPILLARY
Glucose-Capillary: 105 mg/dL — ABNORMAL HIGH (ref 70–99)
Glucose-Capillary: 111 mg/dL — ABNORMAL HIGH (ref 70–99)
Glucose-Capillary: 115 mg/dL — ABNORMAL HIGH (ref 70–99)
Glucose-Capillary: 129 mg/dL — ABNORMAL HIGH (ref 70–99)
Glucose-Capillary: 89 mg/dL (ref 70–99)
Glucose-Capillary: 92 mg/dL (ref 70–99)
Glucose-Capillary: 99 mg/dL (ref 70–99)

## 2020-04-01 LAB — CBC
HCT: 40.2 % (ref 36.0–46.0)
Hemoglobin: 12.9 g/dL (ref 12.0–15.0)
MCH: 31.2 pg (ref 26.0–34.0)
MCHC: 32.1 g/dL (ref 30.0–36.0)
MCV: 97.3 fL (ref 80.0–100.0)
Platelets: 143 10*3/uL — ABNORMAL LOW (ref 150–400)
RBC: 4.13 MIL/uL (ref 3.87–5.11)
RDW: 15.2 % (ref 11.5–15.5)
WBC: 14.3 10*3/uL — ABNORMAL HIGH (ref 4.0–10.5)
nRBC: 0 % (ref 0.0–0.2)

## 2020-04-01 LAB — BRAIN NATRIURETIC PEPTIDE: B Natriuretic Peptide: 669.6 pg/mL — ABNORMAL HIGH (ref 0.0–100.0)

## 2020-04-01 LAB — LEGIONELLA PNEUMOPHILA SEROGP 1 UR AG: L. pneumophila Serogp 1 Ur Ag: NEGATIVE

## 2020-04-01 MED ORDER — TORSEMIDE 20 MG PO TABS
20.0000 mg | ORAL_TABLET | Freq: Every day | ORAL | Status: DC
  Administered 2020-04-02 – 2020-04-03 (×2): 20 mg via ORAL
  Filled 2020-04-01 (×2): qty 1

## 2020-04-01 MED ORDER — FUROSEMIDE 10 MG/ML IJ SOLN
20.0000 mg | Freq: Two times a day (BID) | INTRAMUSCULAR | Status: DC
  Administered 2020-04-01: 20 mg via INTRAVENOUS
  Filled 2020-04-01: qty 2

## 2020-04-01 MED ORDER — PRO-STAT SUGAR FREE PO LIQD
30.0000 mL | Freq: Three times a day (TID) | ORAL | Status: DC
  Administered 2020-04-01 – 2020-04-02 (×4): 30 mL via ORAL
  Filled 2020-04-01 (×4): qty 30

## 2020-04-01 MED ORDER — IPRATROPIUM-ALBUTEROL 0.5-2.5 (3) MG/3ML IN SOLN
3.0000 mL | Freq: Three times a day (TID) | RESPIRATORY_TRACT | Status: DC
  Administered 2020-04-01 – 2020-04-02 (×3): 3 mL via RESPIRATORY_TRACT
  Filled 2020-04-01 (×3): qty 3

## 2020-04-01 NOTE — TOC Progression Note (Addendum)
Transition of Care [facility]) - Progression Note    Patient Details  Name: [name] MRN: [id number] Date of Birth: 1923/02/01  Transition of Care [facility]) CM/SW [place], [place] Phone Number: 04/01/2020, 8:40 AM  Clinical Narrative:    11:00am- CSW spoke with Yates Decamp in admissions, pt able to return even if insurance denies further therapy. They are following for return. Pt will not need a new COVID swab to return. Referral made to [facility] for outpatient palliative services, (this is [facility] preferred provider).   8:40am- CSW initiated insurance auth for pt return, ref #[id number]. Aware pt son to speak with palliative today, current consult for outpatient palliative referral.   Expected Discharge Plan: Skilled Nursing Facility Barriers to Discharge: Continued Medical Work up, Ship broker  Expected Discharge Plan and Services Expected Discharge Plan: [place] In-house Referral: Clinical Social Work Discharge Planning Services: CM Consult Post Acute Care Choice: Resumption of Product/process development scientist, [place] Living arrangements for the past 2 months: [place]  Readmission Risk Interventions Readmission Risk Prevention Plan 03/28/2020  Transportation Screening Complete  PCP or Specialist Appt within 5-7 Days Complete  Home Care Screening Not Complete  Home Care Screening Not Completed Comments SNF resident  Medication Review (RN CM) Referral to Pharmacy  Some recent data might be hidden

## 2020-04-01 NOTE — Progress Notes (Signed)
Hypoglycemic Event  CBG: 68  Treatment: D50 25 mL (12.5 gm)  Symptoms: None  Follow-up CBG: Time:0048 CBG Result:105  Possible Reasons for Event: Inadequate meal intake  Comments/MD notified:na    [name]

## 2020-04-01 NOTE — Progress Notes (Signed)
Palliative Medicine RN Note: Rec'd a call from pt's son. Previous visitor list contained him & pt's daughter, who has now left town. Got the OK from unit AD Blanch Media to change the visitor list to son and grandson Herbie [place] and [place]). They are coming tomorrow for a meeting with PMT, and they will be accompanied by Marcelino Freestone. I notified the front desk of all of the above.  Marjie Skiff Gayle Martinez, RN, BSN, [facility] Palliative Medicine Team 04/01/2020 3:10 PM Office [phone]

## 2020-04-01 NOTE — Plan of Care (Signed)
  Problem: Education: Goal: Knowledge of General Education information will improve Description Including pain rating scale, medication(s)/side effects and non-pharmacologic comfort measures Outcome: Progressing   

## 2020-04-01 NOTE — Progress Notes (Signed)
Nutrition Follow-up  RD working remotely.  DOCUMENTATION CODES:   Severe malnutrition in context of chronic illness  INTERVENTION:   -Continue MVI with minerals daily -Continue Magic cup TID with meals, each supplement provides 290 kcal and 9 grams of protein -Continue Hormel Shake TID with meals, each supplement provides 520 kcals and 22 grams protein -Increase 30 ml Prostat to TID, each supplement provides 100 kcals and 15 grams protein -Continue feeding assistance with meals  NUTRITION DIAGNOSIS:   Severe Malnutrition related to chronic illness (dementia) as evidenced by moderate fat depletion, severe fat depletion, moderate muscle depletion, severe muscle depletion.  Ongoing  GOAL:   Patient will meet greater than or equal to 90% of their needs  Progressing   MONITOR:   PO intake, Supplement acceptance, Diet advancement, Labs, Weight trends, Skin, I & O's  REASON FOR ASSESSMENT:   Consult Assessment of nutrition requirement/status  ASSESSMENT:   86 y.o. female with PMH of right hip fracture s/p fixation March 2020, Takotsubo cardiomyopathy, aortic stenosis and mild dementia who presented to ED with AMS and admitted for CAP and UTI.  6/10- s/p BSE- advanced to dysphagia 2 diet with thin liquids  Reviewed I/O's: +1.4 L x 24 hours and -90 ml x 24 hours  Attempted to speak with pt via phone, however, no answer.   Pt remains with very poor oral intake; noted meal completion 10%. She is taking Prostat per MAR.   Palliative care following; family leaning towards outpatient palliative care/ SNF with hospice vs comfort care. Family meeting scheduled for today.   Labs reviewed: Na: 147, CBGS: 68-111 (inpatient orders for glycemic control are none).   Diet Order:   Diet Order            DIET DYS 2 Room service appropriate? Yes; Fluid consistency: Thin  Diet effective now                 EDUCATION NEEDS:   Not appropriate for education at this time  Skin:   Skin Assessment: Skin Integrity Issues: Skin Integrity Issues:: Stage I, Stage III, DTI DTI: rt heel Stage I: coccyx Stage III: coccyx  Last BM:  03/31/20  Height:   Ht Readings from Last 1 Encounters:  03/27/20 4\' 6"  (1.372 m)    Weight:   Wt Readings from Last 1 Encounters:  03/31/20 64.9 kg   BMI:  Body mass index is 34.48 kg/m.  Estimated Nutritional Needs:   Kcal:  1700-1900  Protein:  80-95 grams  Fluid:  > 1.7 L    Loistine Chance, RD, LDN, [place] Registered Dietitian II Certified Diabetes Care and Education Specialist Please refer to [facility] for RD and/or RD on-call/weekend/after hours pager

## 2020-04-01 NOTE — Progress Notes (Deleted)
PT Cancellation Note  Patient Details Name: [name] MRN: [id number] DOB: 1923-05-09   Cancelled Treatment:    Reason Eval/Treat Not Completed: (P) Patient declined, no reason specified (Pt reports she just returned back to bed and would prefer to wait until tomorrow.)   Tanazia Achee [name] 04/01/2020, 6:12 PM  Erasmo Leventhal , PTA Acute Rehabilitation Services Pager [phone] Office [phone]

## 2020-04-01 NOTE — Progress Notes (Signed)
This chaplain responds to PMT NP-MYF consult for routine spiritual care. The chaplain is pastorally present bedside with Pt. The Pt. is awake and does not make eye contact with the chaplain as the chaplain talks to the Pt.  The chaplain is available for F/U spiritual care as needed.

## 2020-04-01 NOTE — Consult Note (Addendum)
NAME:  [name], MRN:  [id number], DOB:  [birth year], LOS: 5 ADMISSION DATE:  03/27/2020, CONSULTATION DATE:  6/14 REFERRING MD:  Grandville Silos, CHIEF COMPLAINT:  Effusion    Brief History   86 year old female admitted from skilled nursing facility with HCAP and left effusion on 6/9.  In spite of therapy has had no improvement radiographically, pulmonary asked to evaluate for pleural effusion and ongoing dyspnea  History of present illness   Recovered 86 year old female patient  with significant medical history as mentioned below currently resides at skilled nursing facility after significant physical decline which she sustained earlier this year after hip fracture.  She was admitted on 6/9 with chief complaint of mental status change.  Apparently she had been in fairly usual native health on 6/6 and 6/7 when seen by family however on 6/8 apparently during nursing home rounds there was concern about dehydration, mental status change, and a chest x-ray was obtained raising concern for pneumonia for which she was started on doxycycline.  Apparently her son later went to see her on 6/9 and found her very confused, and not able to communicate.  She was brought to the emergency room, she was noted to have pulse oximetry of 88% requiring titration of supplemental oxygen up to 1.5 L, white blood cell count was 15.3, and chest x-ray showed left lower lobe consolidation with element of effusion she was admitted with a working diagnosis of acute hypoxic respiratory failure with pneumonia, also incidentally found to have urinary tract infection for which she was treated she was placed on supplemental oxygen empiric antibiotics and admitted to the medical ward.  As of 6/14 she remains quite encephalopathic.  She has had a couple episodes of hypoglycemia.  Culture data pulmonary wise have been unrevealing chest x-ray obtained today comparing to admission continues to demonstrate what appears to be left lower  lobe consolidation and pleural effusion, she is currently has rhonchorous respiratory efforts, but is on room air.  Pulmonary was asked to evaluate for ongoing left pleural effusion, and to evaluate for possible thoracentesis, also to comment as to if this may be contributing to dyspnea.  Past Medical History   Dementia, recent right hip fracture status post fixation in March 2020, Takotsubo cardiomyopathy, aortic stenosis, prior community-acquired pneumonia, prior urinary tract infection, CKD stage IV, protein calorie malnutrition, history of SIADH, diastolic heart dysfunction/diastolic heart failure, stage I pressure ulcer, persistent atrial fibrillation, history of esophageal reflux, history of anemia, Significant Hospital Events   6/9 admitted 6/14 admitted  Consults:  Pulmonary consult 614 for pleural effusion  Procedures:    Significant Diagnostic Tests:    Micro Data:  Blood culture 6/9 - Urine culture 6/9 multiple organisms  Antimicrobials:  Cefepime 6/10 Doxycycline 6/10   Interim history/subjective:  Seems to be resting comfortably  Objective   Blood pressure (Abnormal) 128/59, pulse 65, temperature 98.8 F (37.1 C), temperature source Oral, resp. rate 20, height 4\' 6"  (1.372 m), weight 64.9 kg, SpO2 97 %.        Intake/Output Summary (Last 24 hours) at 04/01/2020 1529 Last data filed at 04/01/2020 1300 Gross per 24 hour  Intake 1578.83 ml  Output 500 ml  Net 1078.83 ml   Filed Weights   03/29/20 0500 03/30/20 0500 03/31/20 0404  Weight: 61.1 kg 59.4 kg 64.9 kg    Examination: General: Chronically ill-appearing 86 year old white female resting in bed she is lethargic but did not in acute distress HENT: Mucous membranes are dry poor mentation neck  veins flat sclera nonicteric Lungs: Scattered rhonchi bilateral no accessory use currently room air Cardiovascular: Regular irregular with holosystolic murmur Abdomen: Soft not tender Extremities: Bilateral boots  in place for foot drop Neuro: Opens eyes, minimally verbal, moves extremities GU: Incontinent  Resolved Hospital Problem list   Acute hypoxic respiratory failure  Assessment & Plan:  HCAP versus aspiration pneumonia Dysphagia Left pleural effusion Mucous plugging/atelectasis Ongoing acute toxic/metabolic encephalopathy Dementia Protein calorie malnutrition History of aortic stenosis Diastolic heart failure Immobility  CKD stage IV  Pulmonary problem list: Healthcare associated pneumonia versus aspiration pneumonia with persistent left lower lobe consolidative atelectasis and small pleural effusion Acute metabolic encephalopathy superimposed on underlying dementia Dysphagia Gastroesophageal reflux disease  Discussion Ongoing rhonchorous breathing pattern, however denies significant dyspnea, resting comfortably on room air.  She likely does have some degree of pleural effusion, although doubt this is contributing to her work of breathing.  More likely chronic aspiration and dysphagia the bigger factors at this point which are likely exacerbated by;  but also contribute to her encephalopathy. I am concerned that Ms. Spink is heading down a rapidly declining path, and if her mental status does not improve her risk of recurrent aspiration is almost a certainty.    Plan/recommendation We will evaluate her with bedside ultrasound in the morning, if has large left effusion we could consider therapeutic and diagnostic thoracentesis however if effusion is small given her immobility, and what seems to be failure to thrive, risk may outweigh benefit, (especially as her room air pulse ox is 96%) and we may be best just treating antibiotics, providing pulmonary hygiene and understanding she may develop some chronic chest x-ray changes Continue current antibiotic regimen, 7 days should suffice Continue aspiration and reflux precautions Happy to see palliative care is already involved; this  might be the best place to focus her energy, as I suspect thoracentesis will be low yield for symptom improvement unless it is fairly large volume which does not appear to be    Best practice:  Per primary team  Labs   CBC: Recent Labs  Lab 03/27/20 0000 03/27/20 0000 03/27/20 1436 03/27/20 1436 03/28/20 0140 03/29/20 0140 03/30/20 0259 03/31/20 0256 04/01/20 0425  WBC 9.2  --  15.3*   < > 14.6* 12.3* 10.9* 11.1* 14.3*  NEUTROABS 7,682  --  13.9*  --   --   --   --   --   --   HGB 12.1   < > 11.9*   < > 11.1* 11.7* 12.3 12.9 12.9  HCT 36   < > 36.8   < > 33.6* 36.0 38.5 40.4 40.2  MCV  --   --  96.3   < > 94.9 96.0 97.0 96.9 97.3  PLT 203   < > 188   < > 166 154 148* 136* 143*   < > = values in this interval not displayed.    Basic Metabolic Panel: Recent Labs  Lab 03/28/20 0140 03/29/20 1506 03/30/20 0259 03/30/20 1300 03/31/20 0256 03/31/20 1451 04/01/20 0425  NA 145   < > 151* 145 148* 145 147*  K 4.0   < > 2.9* 4.2 3.5 3.6 3.9  CL 107   < > 106 107 108 109 107  CO2 30   < > 32 29 31 28 28   GLUCOSE 54*   < > 67* 137* 96 102* 106*  BUN 68*   < > 30* 28* 25* 23 21  CREATININE 1.73*   < >  1.14* 1.04* 0.99 0.84 0.82  CALCIUM 8.3*   < > 7.9* 7.4* 8.4* 8.0* 8.3*  MG 2.2  --   --   --   --   --   --   PHOS 4.2  --   --   --   --   --   --    < > = values in this interval not displayed.   GFR: Estimated Creatinine Clearance: 29.2 mL/min (by C-G formula based on SCr of 0.82 mg/dL). Recent Labs  Lab 03/27/20 1436 03/27/20 1929 03/28/20 0140 03/28/20 0140 03/29/20 0140 03/30/20 0259 03/31/20 0256 04/01/20 0425  WBC   < >  --  14.6*   < > 12.3* 10.9* 11.1* 14.3*  LATICACIDVEN  --  0.9 1.0  --   --   --   --   --    < > = values in this interval not displayed.    Liver Function Tests: Recent Labs  Lab 03/27/20 0000  AST 32  ALT 34  ALKPHOS 201*  ALBUMIN 2.9*   No results for input(s): LIPASE, AMYLASE in the last 168 hours. No results for input(s):  AMMONIA in the last 168 hours.  ABG    Component Value Date/Time   TCO2 27 04/21/2010 1534     Coagulation Profile: No results for input(s): INR, PROTIME in the last 168 hours.  Cardiac Enzymes: No results for input(s): CKTOTAL, CKMB, CKMBINDEX, TROPONINI in the last 168 hours.  HbA1C: No results found for: HGBA1C  CBG: Recent Labs  Lab 03/31/20 2347 04/01/20 0048 04/01/20 0338 04/01/20 0734 04/01/20 1159  GLUCAP 68* 105* 111* 89 115*    Review of Systems:   Not able   Past Medical History  She,  has a past medical history of Amputation of finger of right hand, Aortic stenosis, mild (03/03/2016), Arthritis, Back pain, Bradycardia (08/28/2015), CKD (chronic kidney disease) stage 3, GFR 30-59 ml/min, Closed right hip fracture (HCC) (12/2018), Depression, GERD (gastroesophageal reflux disease), GI bleed due to NSAIDs, Hyperlipidemia, Hypertension, Macular degeneration, Maxillary sinusitis, MVP (mitral valve prolapse) (03/03/2016), Old MI (myocardial infarction), Osteoporosis, Persistent atrial fibrillation ([place]) (2006), PMR (polymyalgia rheumatica) (HCC), SIADH (syndrome of inappropriate ADH production) ([place]), Takotsubo cardiomyopathy, and Ventricular tachycardia ([place]).   Surgical History    Past Surgical History:  Procedure Laterality Date  . CARDIAC CATHETERIZATION     normal coronary arteries  . CHOLECYSTECTOMY  11/10/2011   Procedure: LAPAROSCOPIC CHOLECYSTECTOMY WITH INTRAOPERATIVE CHOLANGIOGRAM;  Surgeon: Pedro Earls, MD;  Location: [facility];  Service: General;  Laterality: N/A;  . EYE SURGERY  06/10/11   membrane peel   . INTRAMEDULLARY (IM) NAIL INTERTROCHANTERIC Right 12/21/2018   Procedure: INTRAMEDULLARY (IM) NAIL INTERTROCHANTRIC;  Surgeon: Nicholes Stairs, MD;  Location: [facility];  Service: Orthopedics;  Laterality: Right;  . NASAL SINUS SURGERY Left   . ROTATOR CUFF REPAIR  1998  . UMBILICAL HERNIA REPAIR  11/10/2011   Procedure: HERNIA REPAIR UMBILICAL  ADULT;  Surgeon: Pedro Earls, MD;  Location: [facility];  Service: General;  Laterality: N/A;  . WISDOM TOOTH EXTRACTION       Social History   reports that she has never smoked. She has never used smokeless tobacco. She reports that she does not drink alcohol and does not use drugs.   Family History   Her family history includes Cancer in her mother.   Allergies Allergies  Allergen Reactions  . Amlodipine Swelling    To feet and ankles   .  Avelox [Moxifloxacin Hcl In Nacl] Other (See Comments)    Pt does not remember   . Biaxin [Clarithromycin]   . Ceftin [Cefuroxime Axetil]   . Duragesic Disc Transdermal System [Fentanyl] Other (See Comments)    Reaction unknown  . Forteo [Parathyroid Hormone (Recomb)] Other (See Comments)    Made skin dry  . Morphine And Related Other (See Comments)    "My Sister and daughter can,t take it. I've had it before without problems."  . Teriparatide   . Lidocaine Rash     Home Medications  Prior to Admission medications   Medication Sig Start Date End Date Taking? Authorizing Provider  acetaminophen (TYLENOL) 325 MG tablet Take 650 mg by mouth 2 (two) times daily.    Yes [provider]  acetaminophen (TYLENOL) 500 MG tablet Take 500 mg by mouth every 6 (six) hours as needed for moderate pain. Every 6hrs.   Yes [provider]  albuterol (PROVENTIL) (2.5 MG/3ML) 0.083% nebulizer solution Take 2.5 mg by nebulization as directed. Take 1 neb inhalation every 6 hours and Take 1 neb inhalation every 6 hours PRN FOR SOB AND WHEEZING   Yes [provider]  Amino Acids-Protein Hydrolys (FEEDING SUPPLEMENT, PRO-STAT SUGAR FREE 64,) LIQD Take 30 mLs by mouth daily.   Yes [provider]  amoxicillin-clavulanate (AUGMENTIN) 500-125 MG tablet Take 1 tablet by mouth every 8 (eight) hours. 03/26/20 04/02/20 Yes [provider]  azelastine (ASTELIN) 0.1 % nasal spray Place 2 sprays into both nostrils daily.    Yes  [provider]  budesonide-formoterol (SYMBICORT) 80-4.5 MCG/ACT inhaler Inhale 2 puffs into the lungs 2 (two) times daily.    Yes [provider]  buPROPion (WELLBUTRIN SR) 150 MG 12 hr tablet Take 1 tablet (150 mg total) by mouth every morning. 12/26/18  Yes Roney Jaffe, MD  carboxymethylcellul-glycerin (LUBRICATING EYE DROPS) 0.5-0.9 % ophthalmic solution Place 1 drop into both eyes 3 (three) times daily as needed for dry eyes. For allergies   Yes [provider]  cetirizine (ZYRTEC) 10 MG tablet Take 10 mg by mouth daily.    Yes [provider]  demeclocycline (DECLOMYCIN) 150 MG tablet Take 150 mg by mouth 2 (two) times daily.  08/17/11  Yes [provider]  diclofenac sodium (VOLTAREN) 1 % GEL Apply 1 application topically 4 (four) times daily as needed (pain).    Yes [provider]  famotidine (PEPCID) 20 MG tablet Take 20 mg by mouth daily.   Yes [provider]  Ferrous Sulfate (IRON) 325 (65 Fe) MG TABS Take 1 tablet by mouth every other day. Give with meals   Yes [provider]  fluticasone (FLONASE) 50 MCG/ACT nasal spray Place 2 sprays into both nostrils daily.    Yes [provider]  memantine (NAMENDA) 5 MG tablet Take 5 mg by mouth 2 (two) times daily.    Yes [provider]  nystatin cream (MYCOSTATIN) Apply 1 application topically 2 (two) times daily.  03/25/20  Yes [provider]  ondansetron (ZOFRAN-ODT) 4 MG disintegrating tablet Take 4 mg by mouth every 6 (six) hours as needed for nausea or vomiting.  01/19/20  Yes [provider]  pantoprazole (PROTONIX) 40 MG tablet Take 40 mg by mouth daily.   Yes [provider]  polyethylene glycol (MIRALAX / GLYCOLAX) packet Take 17 g by mouth daily as needed for mild constipation.    Yes [provider]  torsemide (DEMADEX) 20 MG tablet Take 20  mg by mouth daily.  06/26/19  Yes [provider]    triamcinolone cream (KENALOG) 0.5 % Apply 1 application topically 2 (two) times daily.   Yes [provider]  zinc oxide 20 % ointment Apply 1 application topically as needed for irritation. To buttocks after every incontinent episode and as needed for redness. May keep at bedside   Yes [provider]  ondansetron (ZOFRAN) 4 MG tablet Take 1 tablet (4 mg total) by mouth every 6 (six) hours as needed for nausea. Patient not taking: Reported on 03/27/2020 12/23/18   Cristal Deer, MD  pregabalin (LYRICA) 25 MG capsule Take 1 capsule (25 mg total) by mouth 2 (two) times daily. 03/27/20   [name], MD     Critical care time: not applicable      Erick Colace ACNP-BC [facility] Pager # [phone] OR # [phone] if no answer

## 2020-04-01 NOTE — Progress Notes (Signed)
[facility]   Referral received for outpatient palliative care services once d/c back to [facility].  [facility] will follow up with patient at facility once discharged.  Thank you, Venia Carbon RN, BSN, [facility] Liaison

## 2020-04-01 NOTE — Progress Notes (Deleted)
PT Cancellation Note  Patient Details Name: [name] MRN: [id number] DOB: [birth year]   Cancelled Treatment:    Reason Eval/Treat Not Completed: (P) Patient declined, no reason specified (Pt reports she just returned back to bed and would prefer to wait until tomorrow.)   Shirell Struthers [name] 04/01/2020, 6:11 PM  Erasmo Leventhal , PTA [place] Pager [phone] Office [phone]

## 2020-04-01 NOTE — Progress Notes (Signed)
PROGRESS NOTE    [name]  BZJ:[id number] DOB: 1923/05/09 DOA: 03/27/2020 PCP: Cheryl Dad, MD    Chief Complaint  Patient presents with  . AMS    Brief Narrative:  HPI per Dr. Ina Kick is a 86 y.o. female with PMH of right hip fracture s/p fixation March 2020,Takotsubo cardiomyopathy, aortic stenosis and mild dementia who presented to ED with AMS and admitted for CAP and UTI.  Patient non-verbal currently. All history provided by patient's son. [name], [name], reports that patient lives in a nursing home after her physical decline following hip fracture. At baseline, she walks with a walker and converses normally. Reports only very mild dementia that is usually not noticeable. He went to visit her on Sunday and noticed that she was not quite as mentally clear and her speech was a little different. His sister saw her on Monday and noted similar findings. Yesterday they obtained imaging which was c/w PNA and patient was started on Doxycyline. [name] went to see her today and noticed a marked decline. She was completely disoriented, not speaking and not walking. The NP at the nursing home obtained labs and was concerned for dehydration and gave IVF. They then recommended she come to the ER.   In the ED: Vitals stable initially on room air and then RN placed on 1.5 L O2 after desat to 88%. Labs remarkable for COVID neg, glucose 56, Cr 2.29, WBC 15.3, BNP 244, Trop 10>12, UA with large leuks and > 50 WBCs. Lactate 0.9.   CXR: Airspace consolidation consistent with pneumonia left base with small left pleural effusion. Lungs elsewhere clear. Stable cardiac Prominence.  XR Hip: 1. 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 Bony resorption of the majority of the right femoral head, with bony erosion of the acetabular roof by the dynamic screw. 2. Stable left hip osteoarthritis.  Patient was given IVF, Doxy and  Rocephin and called for admission.   Assessment & Plan:   Principal Problem:   Acute encephalopathy Active Problems:   SIADH (syndrome of inappropriate ADH production) (HCC)   Edema of extremities   Protein-calorie malnutrition, severe   Depression due to dementia (HCC)   CKD (chronic kidney disease) stage 4, GFR 15-29 ml/min (HCC)   Diastolic dysfunction   Pleural effusion   Acute kidney injury superimposed on CKD ([place])   Hypoxemia   CAP (community acquired pneumonia)   Cystitis   Pressure injury of skin   Acute on chronic diastolic CHF (congestive heart failure) ([place])   Acute cystitis with hematuria   HCAP (healthcare-associated pneumonia)   Hypokalemia   Hypernatremia   Palliative care by specialist   Goals of care, counseling/discussion   DNR (do not resuscitate)  1 acute toxic metabolic encephalopathy Likely multifactorial secondary to healthcare associated pneumonia (from SNF), UTI, episodes of hypoglycemia.  Patient noted to have 2 episodes of hypoglycemia with CBGs of 41(03/28/2020).  CT head done negative for any acute abnormalities.  CBGs improving.  Patient seen by speech therapy and patient on a dysphagia 2 diet.  Urine Legionella antigen pending.  Urine pneumococcus antigen negative.  CBG of 89 this morning.  Continue IV cefepime.  D5W discontinued.. Patient noted to be volume overloaded on examination and subsequently placed on IV Lasix with good diuresis.  IV Lasix have been completed.  Patient improving clinically.  Continue supportive care.  Follow.   2.  HCAP/acute respiratory failure with hypoxia/left pleural effusion Noted on  imaging.  Patient noted to have an allergy to azithromycin.  Urine Legionella antigen pending.  Urine pneumococcus antigen negative.  Blood cultures pending with no growth to date.  Lactate within normal limits.  As patient from a nursing facility IV Rocephin was changed to IV cefepime.  Status post IV doxycycline.  Continue IV cefepime,  Mucinex, Flonase, Claritin, PPI.  Due to persistent left-sided pleural effusion and some increased work of breathing will consult with pulmonary for further evaluation due to concern for possible empyema.  Follow.   3.  Acute renal failure on chronic kidney disease stage 4 Baseline creatinine 1.3.  Creatinine on admission was 2.29 on admission.  Patient noted to have received IV fluids in the nursing home and in the ED.  Patient volume overloaded on examination early on in the hospitalization.  Patient noted to have poor oral intake.  Creatinine improved with diuresis.  Creatinine currently at 0.82.  Patient given a dose of Lasix 20 mg IV x1 this morning.  Continue to hold home dose oral torsemide for now as patient with worsening hypernatremia and likely resume tomorrow..  Continue strict I's and O's.  Daily weights.  Follow.    4.  UTI Urine cultures with multiple species.  Follow.   5.  Acute on chronic diastolic heart failure Patient noted to have a grade 1 diastolic dysfunction per 2D echo 2019.  Patient volume overloaded on examination.  Cardiac enzymes negative.  BNP at 244.9 on admission.  2D echo pending.  Patient was placed on Lasix 20 mg IV every 12 hours.  Patient now hypernatremic and as such oral torsemide was held.  Patient gently hydrated.  Patient with some shortness of breath today and just received a dose of Lasix 20 mg IV x1.  Repeat BNP.  Strict I's and O's.  Daily weights.  Follow.   6.  Depression Continue to hold Wellbutrin until improvement with oral intake and tolerating a diet.  Will likely resume Wellbutrin on discharge.  7.  Severe protein calorie malnutrition Nutrition consulted.  Magnesium at 2.2.  Phosphorus at 4.2.  Follow.  8.  Stage I pressure injury coccyx, POA Continue current wound care.  9.  SIADH Continue home regimen demeclocycline.  10.  Hypokalemia Repleted.    11.  Hypernatremia IV Lasix have been completed.  D5W discontinued.  Resume torsemide  tomorrow.  Follow.   Pressure Injury 03/27/20 Coccyx Medial Stage 1 -  Intact skin with non-blanchable redness of a localized area usually over a bony prominence. (Active)  03/27/20 2300  Location: Coccyx  Location Orientation: Medial  Staging: Stage 1 -  Intact skin with non-blanchable redness of a localized area usually over a bony prominence.  Wound Description (Comments):   Present on Admission:      Pressure Injury 03/30/20 Heel Right Deep Tissue Pressure Injury - Purple or maroon localized area of discolored intact skin or blood-filled blister due to damage of underlying soft tissue from pressure and/or shear. (Active)  03/30/20 0400  Location: Heel  Location Orientation: Right  Staging: Deep Tissue Pressure Injury - Purple or maroon localized area of discolored intact skin or blood-filled blister due to damage of underlying soft tissue from pressure and/or shear.  Wound Description (Comments):   Present on Admission: Yes     Pressure Injury 03/30/20 Coccyx Left;Medial Stage 2 -  Partial thickness loss of dermis presenting as a shallow open injury with a red, pink wound bed without slough. Skin tear (Active)  03/30/20 1630  Location: Coccyx  Location Orientation: Left;Medial  Staging: Stage 2 -  Partial thickness loss of dermis presenting as a shallow open injury with a red, pink wound bed without slough.  Wound Description (Comments): Skin tear  Present on Admission: No          DVT prophylaxis: Heparin Code Status: DNR Family Communication: No family at bedside. Disposition:   Status is: Inpatient    Dispo: The patient is from: SNF              Anticipated d/c is to: Likely back to SNF              Anticipated d/c date is: 1-2 days.              Patient currently on IV antibiotics, and acute metabolic encephalopathy, hypernatremic, persistent pleural effusion.currently not stable for discharge.        Consultants:   Pulmonary pending  Procedures:   CT  head without contrast 03/27/2020  Chest x-ray 03/27/2020  Plain films of right hip and pelvis 03/27/2020  Antimicrobials:   Cefepime 03/28/2020>>>>>  IV doxycycline 03/27/2020>>>>> 04/01/2020  IV Rocephin 03/27/2020>>:>>> 03/28/2020   Subjective: Patient alert today.  Complains of some shortness of breath.  Denies any chest pain.  No abdominal pain.   Objective: Vitals:   03/31/20 1427 03/31/20 2001 03/31/20 2008 04/01/20 0341  BP: (!) 144/59 (!) 136/43 (!) 139/49 (!) 147/57  Pulse: (!) 58 60 (!) 59 63  Resp: 20 16  18   Temp: 97.8 F (36.6 C) 97.7 F (36.5 C)  98.1 F (36.7 C)  TempSrc: Oral Oral  Oral  SpO2: 99% 98%  100%  Weight:      Height:        Intake/Output Summary (Last 24 hours) at 04/01/2020 1049 Last data filed at 04/01/2020 0840 Gross per 24 hour  Intake 1518.83 ml  Output 500 ml  Net 1018.83 ml   Filed Weights   03/29/20 0500 03/30/20 0500 03/31/20 0404  Weight: 61.1 kg 59.4 kg 64.9 kg    Examination:  General exam: More alert. Respiratory system: Less rhonchorous coarse breath sounds anterior lung fields.  Some use of accessory muscles of respiration. Cardiovascular system: RRR no murmurs rubs or gallops.  Trace bilateral pedal edema.  Gastrointestinal system: Abdomen is nontender, nondistended, soft, positive bowel sounds.  No rebound.  No guarding.  Central nervous system: Alert. No focal neurological deficits. Extremities: Trace bilateral pedal edema.  Lower extremities in heel floaters.  Symmetric 5 x 5 power. Skin: No rashes, lesions or ulcers Psychiatry: Judgement and insight appear fair. Mood & affect unable to assess.     Data Reviewed: I have personally reviewed following labs and imaging studies  CBC: Recent Labs  Lab 03/27/20 0000 03/27/20 0000 03/27/20 1436 03/27/20 1436 03/28/20 0140 03/29/20 0140 03/30/20 0259 03/31/20 0256 04/01/20 0425  WBC 9.2  --  15.3*   < > 14.6* 12.3* 10.9* 11.1* 14.3*  NEUTROABS 7,682  --  13.9*  --   --    --   --   --   --   HGB 12.1   < > 11.9*   < > 11.1* 11.7* 12.3 12.9 12.9  HCT 36   < > 36.8   < > 33.6* 36.0 38.5 40.4 40.2  MCV  --   --  96.3   < > 94.9 96.0 97.0 96.9 97.3  PLT 203   < > 188   < > 166 154 148*  136* 143*   < > = values in this interval not displayed.    Basic Metabolic Panel: Recent Labs  Lab 03/28/20 0140 03/29/20 1506 03/30/20 0259 03/30/20 1300 03/31/20 0256 03/31/20 1451 04/01/20 0425  NA 145   < > 151* 145 148* 145 147*  K 4.0   < > 2.9* 4.2 3.5 3.6 3.9  CL 107   < > 106 107 108 109 107  CO2 30   < > 32 29 31 28 28   GLUCOSE 54*   < > 67* 137* 96 102* 106*  BUN 68*   < > 30* 28* 25* 23 21  CREATININE 1.73*   < > 1.14* 1.04* 0.99 0.84 0.82  CALCIUM 8.3*   < > 7.9* 7.4* 8.4* 8.0* 8.3*  MG 2.2  --   --   --   --   --   --   PHOS 4.2  --   --   --   --   --   --    < > = values in this interval not displayed.    GFR: Estimated Creatinine Clearance: 29.2 mL/min (by C-G formula based on SCr of 0.82 mg/dL).  Liver Function Tests: Recent Labs  Lab 03/27/20 0000  AST 32  ALT 34  ALKPHOS 201*  ALBUMIN 2.9*    CBG: Recent Labs  Lab 03/31/20 1952 03/31/20 2347 04/01/20 0048 04/01/20 0338 04/01/20 0734  GLUCAP 72 68* 105* 111* 89     Recent Results (from the past 240 hour(s))  SARS Coronavirus 2 by RT PCR (hospital order, performed in [facility] hospital lab) Nasopharyngeal Nasopharyngeal Swab     Status: None   Collection Time: 03/27/20  2:27 PM   Specimen: Nasopharyngeal Swab  Result Value Ref Range Status   SARS Coronavirus 2 NEGATIVE NEGATIVE Final    Comment: (NOTE) SARS-CoV-2 target nucleic acids are NOT DETECTED. The SARS-CoV-2 RNA is generally detectable in upper and lower respiratory specimens during the acute phase of infection. The lowest concentration of SARS-CoV-2 viral copies this assay can detect is 250 copies / mL. A negative result does not preclude SARS-CoV-2 infection and should not be used as the sole basis for  treatment or other patient management decisions.  A negative result may occur with improper specimen collection / handling, submission of specimen other than nasopharyngeal swab, presence of viral mutation(s) within the areas targeted by this assay, and inadequate number of viral copies (<250 copies / mL). A negative result must be combined with clinical observations, patient history, and epidemiological information. Fact Sheet for Patients:   StrictlyIdeas.no Fact Sheet for Healthcare Providers: BankingDealers.co.za This test is not yet approved or cleared  by the [place] FDA and has been authorized for detection and/or diagnosis of SARS-CoV-2 by FDA under an Emergency Use Authorization (EUA).  This EUA will remain in effect (meaning this test can be used) for the duration of the COVID-19 declaration under Section 564(b)(1) of the Act, 21 U.S.C. section 360bbb-3(b)(1), unless the authorization is terminated or revoked sooner. Performed at [facility], [place] [address] [place], [place] [address]   Urine culture     Status: Abnormal   Collection Time: 03/27/20  5:06 PM   Specimen: Urine, Random  Result Value Ref Range Status   Specimen Description URINE, RANDOM  Final   Special Requests   Final    NONE Performed at [facility], [place] [address], [place], [place] [address]    Culture MULTIPLE  SPECIES PRESENT, SUGGEST RECOLLECTION (A)  Final   Report Status 03/28/2020 FINAL  Final  Culture, blood (routine x 2)     Status: None   Collection Time: 03/27/20  7:30 PM   Specimen: BLOOD RIGHT HAND  Result Value Ref Range Status   Specimen Description BLOOD RIGHT HAND  Final   Special Requests   Final    BOTTLES DRAWN AEROBIC AND ANAEROBIC Blood Culture adequate volume   Culture   Final    NO GROWTH 5 DAYS Performed at [facility], [place] [address] [place], [place] [address]    Report Status 04/01/2020 FINAL  Final    Culture, blood (routine x 2)     Status: None   Collection Time: 03/27/20  7:30 PM   Specimen: BLOOD  Result Value Ref Range Status   Specimen Description BLOOD RIGHT ANTECUBITAL  Final   Special Requests   Final    BOTTLES DRAWN AEROBIC AND ANAEROBIC Blood Culture adequate volume   Culture   Final    NO GROWTH 5 DAYS Performed at [facility], [place] [address] [place], [place] [address]    Report Status 04/01/2020 FINAL  Final         Radiology Studies: No results found.      Scheduled Meds: . acetaminophen  650 mg Oral BID  . azelastine  2 spray Each Nare Daily  . demeclocycline  150 mg Oral BID  . dextromethorphan-guaiFENesin  1 tablet Oral BID  . famotidine  10 mg Oral Daily  . feeding supplement (PRO-STAT SUGAR FREE 64)  30 mL Oral TID BM  . fluticasone  2 spray Each Nare Daily  . heparin  5,000 Units Subcutaneous Q8H  . loratadine  10 mg Oral Daily  . multivitamin with minerals  1 tablet Oral Q1200  . pantoprazole  40 mg Oral Q0600  . pregabalin  25 mg Oral BID   Continuous Infusions: . ceFEPime (MAXIPIME) IV 2 g (04/01/20 1034)     LOS: 5 days    Time spent: 35 minutes    Irine Seal, MD [facility]   To contact the attending provider between 7A-7P or the covering provider during after hours 7P-7A, please log into the web site [email or web address] and access using universal [place] password for that web site. If you do not have the password, please call the hospital operator.  04/01/2020, 10:49 AM

## 2020-04-02 ENCOUNTER — Inpatient Hospital Stay (HOSPITAL_COMMUNITY): Payer: Medicare PPO

## 2020-04-02 DIAGNOSIS — I5031 Acute diastolic (congestive) heart failure: Secondary | ICD-10-CM

## 2020-04-02 LAB — BASIC METABOLIC PANEL
Anion gap: 11 (ref 5–15)
BUN: 26 mg/dL — ABNORMAL HIGH (ref 8–23)
CO2: 27 mmol/L (ref 22–32)
Calcium: 8.2 mg/dL — ABNORMAL LOW (ref 8.9–10.3)
Chloride: 107 mmol/L (ref 98–111)
Creatinine, Ser: 0.99 mg/dL (ref 0.44–1.00)
GFR calc Af Amer: 56 mL/min — ABNORMAL LOW (ref >60)
GFR calc non Af Amer: 48 mL/min — ABNORMAL LOW (ref >60)
Glucose, Bld: 84 mg/dL (ref 70–99)
Potassium: 3.5 mmol/L (ref 3.5–5.1)
Sodium: 145 mmol/L (ref 135–145)

## 2020-04-02 LAB — CBC WITH DIFFERENTIAL/PLATELET
Abs Immature Granulocytes: 0.13 10*3/uL — ABNORMAL HIGH (ref 0.00–0.07)
Basophils Absolute: 0 10*3/uL (ref 0.0–0.1)
Basophils Relative: 0 %
Eosinophils Absolute: 0.3 10*3/uL (ref 0.0–0.5)
Eosinophils Relative: 1 %
HCT: 39.3 % (ref 36.0–46.0)
Hemoglobin: 12.7 g/dL (ref 12.0–15.0)
Immature Granulocytes: 1 %
Lymphocytes Relative: 7 %
Lymphs Abs: 1.5 10*3/uL (ref 0.7–4.0)
MCH: 30.7 pg (ref 26.0–34.0)
MCHC: 32.3 g/dL (ref 30.0–36.0)
MCV: 94.9 fL (ref 80.0–100.0)
Monocytes Absolute: 1.6 10*3/uL — ABNORMAL HIGH (ref 0.1–1.0)
Monocytes Relative: 8 %
Neutro Abs: 16.8 10*3/uL — ABNORMAL HIGH (ref 1.7–7.7)
Neutrophils Relative %: 83 %
Platelets: 156 10*3/uL (ref 150–400)
RBC: 4.14 MIL/uL (ref 3.87–5.11)
RDW: 15.1 % (ref 11.5–15.5)
WBC: 20.3 10*3/uL — ABNORMAL HIGH (ref 4.0–10.5)
nRBC: 0 % (ref 0.0–0.2)

## 2020-04-02 LAB — GLUCOSE, CAPILLARY
Glucose-Capillary: 88 mg/dL (ref 70–99)
Glucose-Capillary: 66 mg/dL — ABNORMAL LOW (ref 70–99)
Glucose-Capillary: 87 mg/dL (ref 70–99)
Glucose-Capillary: 110 mg/dL — ABNORMAL HIGH (ref 70–99)
Glucose-Capillary: 137 mg/dL — ABNORMAL HIGH (ref 70–99)
Glucose-Capillary: 75 mg/dL (ref 70–99)

## 2020-04-02 LAB — ECHOCARDIOGRAM COMPLETE
Height: 54 in
Weight: 2160 oz

## 2020-04-02 MED ORDER — IPRATROPIUM-ALBUTEROL 0.5-2.5 (3) MG/3ML IN SOLN
3.0000 mL | Freq: Two times a day (BID) | RESPIRATORY_TRACT | Status: DC
  Administered 2020-04-03: 3 mL via RESPIRATORY_TRACT
  Filled 2020-04-02: qty 3

## 2020-04-02 MED ORDER — DOXYCYCLINE HYCLATE 100 MG PO TABS
100.0000 mg | ORAL_TABLET | Freq: Two times a day (BID) | ORAL | Status: DC
  Administered 2020-04-02: 100 mg via ORAL
  Filled 2020-04-02: qty 1

## 2020-04-02 NOTE — TOC Progression Note (Addendum)
Transition of Care [facility]) - Progression Note    Patient Details  Name: [name] MRN: [id number] Date of Birth: 1923-10-18  Transition of Care [facility]) CM/SW [place], [place] Phone Number: 04/02/2020, 12:00 PM  Clinical Narrative:    3:10pm- additional clinicals sent to [facility].   12:00pm- TOC team continuing to follow, ongoing medical management. Update provided to SNF. CSW also has contacted PT as [facility] requesting an additional PT note. Further GOC also planned for today.   Expected Discharge Plan: [place] Barriers to Discharge: Continued Medical Work up, Ship broker  Expected Discharge Plan and Services Expected Discharge Plan: [place] In-house Referral: Clinical Social Work Discharge Planning Services: CM Consult Post Acute Care Choice: Resumption of Product/process development scientist, [place] arrangements for the past 2 months: [place]     Readmission Risk Interventions Readmission Risk Prevention Plan 03/28/2020  Transportation Screening Complete  PCP or Specialist Appt within 5-7 Days Complete  Home Care Screening Not Complete  Home Care Screening Not Completed Comments SNF resident  Medication Review (RN CM) Referral to Pharmacy  Some recent data might be hidden

## 2020-04-02 NOTE — Progress Notes (Signed)
Pharmacy Antibiotic Note  [name] is a 86 y.o. female admitted on 03/27/2020 with HCAP and UTI.  Pharmacy has been consulted for cefepime dosing.   WBC up trending since doxycyline 5d course was completed. Not on steroids. On room air. Unclear if had another aspiration event. Spoke with MD and doxycyline was restarted in case of MRSA (atypical infection would be treated after 5 days). Last MRSA screen was negative in 2020. Patient is from SNF so has potential exposure. Continue cefepime per MD (today is day 6).   Plan: Continue cefepime to 2 g IV q 24 h  Restart doxycycline 100mg  BID  Monitor clinical progress, renal function and LOT    Height: 4\' 6"  (137.2 cm) Weight: 61.2 kg (135 lb) IBW/kg (Calculated) : 31.7  Temp (24hrs), Avg:98.3 F (36.8 C), Min:97.8 F (36.6 C), Max:98.8 F (37.1 C)  Recent Labs  Lab 03/27/20 1436 03/27/20 1929 03/28/20 0140 03/28/20 0140 03/29/20 0140 03/29/20 1506 03/30/20 0259 03/30/20 0259 03/30/20 1300 03/31/20 0256 03/31/20 1451 04/01/20 0425 04/02/20 0441  WBC   < >  --  14.6*   < > 12.3*  --  10.9*  --   --  11.1*  --  14.3* 20.3*  CREATININE   < >  --  1.73*  --   --    < > 1.14*   < > 1.04* 0.99 0.84 0.82 0.99  LATICACIDVEN  --  0.9 1.0  --   --   --   --   --   --   --   --   --   --    < > = values in this interval not displayed.    Estimated Creatinine Clearance: 23.3 mL/min (by C-G formula based on SCr of 0.99 mg/dL).    Antimicrobials this admission: 6/9 ceftriaxone  6/9 doxycycline >>6/13; 6/15 >> 6/10 cefepime>>  Microbiology results: 6/9 BCx: ngtd 6/9 UCx: multiple species    Benetta Spar, PharmD, BCPS, [facility] Clinical Pharmacist  Please check AMION for all [place] phone numbers After 10:00 PM, call [place] [phone]

## 2020-04-02 NOTE — Progress Notes (Addendum)
NAME:  [name], MRN:  [id number], DOB:  1923-06-27, LOS: 6 ADMISSION DATE:  03/27/2020, CONSULTATION DATE:  6/14 REFERRING MD:  Grandville Silos, CHIEF COMPLAINT:  Effusion    Brief History   86 year old female admitted from skilled nursing facility with HCAP and left effusion on 6/9.  In spite of therapy has had no improvement radiographically, pulmonary asked to evaluate for pleural effusion and ongoing dyspnea  History of present illness   Recovered 86 year old female patient  with significant medical history as mentioned below currently resides at skilled nursing facility after significant physical decline which she sustained earlier this year after hip fracture.  She was admitted on 6/9 with chief complaint of mental status change.  Apparently she had been in fairly usual native health on 6/6 and 6/7 when seen by family however on 6/8 apparently during nursing home rounds there was concern about dehydration, mental status change, and a chest x-ray was obtained raising concern for pneumonia for which she was started on doxycycline.  Apparently her son later went to see her on 6/9 and found her very confused, and not able to communicate.  She was brought to the emergency room, she was noted to have pulse oximetry of 88% requiring titration of supplemental oxygen up to 1.5 L, white blood cell count was 15.3, and chest x-ray showed left lower lobe consolidation with element of effusion she was admitted with a working diagnosis of acute hypoxic respiratory failure with pneumonia, also incidentally found to have urinary tract infection for which she was treated she was placed on supplemental oxygen empiric antibiotics and admitted to the medical ward.  As of 6/14 she remains quite encephalopathic.  She has had a couple episodes of hypoglycemia.  Culture data pulmonary wise have been unrevealing chest x-ray obtained today comparing to admission continues to demonstrate what appears to be left lower  lobe consolidation and pleural effusion, she is currently has rhonchorous respiratory efforts, but is on room air.  Pulmonary was asked to evaluate for ongoing left pleural effusion, and to evaluate for possible thoracentesis, also to comment as to if this may be contributing to dyspnea.  Past Medical History   Dementia, recent right hip fracture status post fixation in March 2020, Takotsubo cardiomyopathy, aortic stenosis, prior community-acquired pneumonia, prior urinary tract infection, CKD stage IV, protein calorie malnutrition, history of SIADH, diastolic heart dysfunction/diastolic heart failure, stage I pressure ulcer, persistent atrial fibrillation, history of esophageal reflux, history of anemia, Significant Hospital Events   6/9 admitted 6/14 admitted  Consults:  Pulmonary consult 614 for pleural effusion  Procedures:    Significant Diagnostic Tests:    Micro Data:  COVID negative 6/9 Blood culture 6/9 - Urine culture 6/9 multiple organisms  Antimicrobials:  Cefepime 6/10 Doxycycline 6/10   Interim history/subjective:  - 1 L overnight with lasix.  BUN/Cr stable Afebrile, Hemodynamics stable. Oxygenating well on room air, no signs of distress.   Objective   Blood pressure (!) 120/50, pulse 68, temperature 98.4 F (36.9 C), temperature source Oral, resp. rate 17, height 4\' 6"  (1.372 m), weight 61.2 kg, SpO2 94 %.        Intake/Output Summary (Last 24 hours) at 04/02/2020 1043 Last data filed at 04/02/2020 0513 Gross per 24 hour  Intake 60 ml  Output 700 ml  Net -640 ml   Filed Weights   03/30/20 0500 03/31/20 0404 04/02/20 0404  Weight: 59.4 kg 64.9 kg 61.2 kg    Examination: General: Chronically ill-appearing 86 year old white female  resting in bed she is lethargic but did not in acute distress HENT: Mucous membranes are dry poor mentation neck veins flat sclera nonicteric Lungs: Scattered rhonchi bilateral no accessory use currently room  air Cardiovascular: Regular irregular with holosystolic murmur Abdomen: Soft not tender Extremities: Bilateral boots in place for foot drop Neuro: Opens eyes, minimally verbal, moves extremities GU: Incontinent  Resolved Hospital Problem list   Acute hypoxic respiratory failure  Assessment & Plan:  HCAP versus aspiration pneumonia with left lower lobe consolidative atelectasis  Small left pleural effusion Mucous plugging/atelectasis --Small left pleural effusion 6/14. Diuresing well overnight. Continue diuresis --Bedside ultrasound with minimal pleural effusion on the left, not amenable to thoracentesis.  Images reviewed with family at bedside. Suspect transudative effusion with significant improvement with diuresis.  --Continue empiric abx for HCAP PNA in setting of dysphagia. --Family meeting today with palliative care.    Dysphagia --SLP evaluated, dysphagia stage II diet  Acute toxic/metabolic encephalopathy with underlying Dementia --Multi-factorial in setting of underlying infection and episodes of hypoglycemia  Protein calorie malnutrition --Nutrition following  History of aortic stenosis  Diastolic heart failure --Repeat ECHO pending  CKD stage IV --Baseline creatinine 1.3 --Tolerating diuresis with creatinine 0.99 --Continue diuresis   Best practice:  Per primary team  Labs   CBC: Recent Labs  Lab 03/27/20 0000 03/27/20 0000 03/27/20 1436 03/28/20 0140 03/29/20 0140 03/30/20 0259 03/31/20 0256 04/01/20 0425 04/02/20 0441  WBC 9.2  --  15.3*   < > 12.3* 10.9* 11.1* 14.3* 20.3*  NEUTROABS 7,682  --  13.9*  --   --   --   --   --  16.8*  HGB 12.1   < > 11.9*   < > 11.7* 12.3 12.9 12.9 12.7  HCT 36   < > 36.8   < > 36.0 38.5 40.4 40.2 39.3  MCV  --   --  96.3   < > 96.0 97.0 96.9 97.3 94.9  PLT 203   < > 188   < > 154 148* 136* 143* 156   < > = values in this interval not displayed.    Basic Metabolic Panel: Recent Labs  Lab 03/28/20 0140  03/29/20 1506 03/30/20 1300 03/31/20 0256 03/31/20 1451 04/01/20 0425 04/02/20 0441  NA 145   < > 145 148* 145 147* 145  K 4.0   < > 4.2 3.5 3.6 3.9 3.5  CL 107   < > 107 108 109 107 107  CO2 30   < > 29 31 28 28 27   GLUCOSE 54*   < > 137* 96 102* 106* 84  BUN 68*   < > 28* 25* 23 21 26*  CREATININE 1.73*   < > 1.04* 0.99 0.84 0.82 0.99  CALCIUM 8.3*   < > 7.4* 8.4* 8.0* 8.3* 8.2*  MG 2.2  --   --   --   --   --   --   PHOS 4.2  --   --   --   --   --   --    < > = values in this interval not displayed.   GFR: Estimated Creatinine Clearance: 23.3 mL/min (by C-G formula based on SCr of 0.99 mg/dL). Recent Labs  Lab 03/27/20 1436 03/27/20 1929 03/28/20 0140 03/29/20 0140 03/30/20 0259 03/31/20 0256 04/01/20 0425 04/02/20 0441  WBC   < >  --  14.6*   < > 10.9* 11.1* 14.3* 20.3*  LATICACIDVEN  --  0.9 1.0  --   --   --   --   --    < > =  values in this interval not displayed.    Liver Function Tests: Recent Labs  Lab 03/27/20 0000  AST 32  ALT 34  ALKPHOS 201*  ALBUMIN 2.9*   No results for input(s): LIPASE, AMYLASE in the last 168 hours. No results for input(s): AMMONIA in the last 168 hours.  ABG    Component Value Date/Time   TCO2 27 04/21/2010 1534     Coagulation Profile: No results for input(s): INR, PROTIME in the last 168 hours.  Cardiac Enzymes: No results for input(s): CKTOTAL, CKMB, CKMBINDEX, TROPONINI in the last 168 hours.  HbA1C: No results found for: HGBA1C  CBG: Recent Labs  Lab 04/01/20 2020 04/01/20 2349 04/02/20 0402 04/02/20 0736 04/02/20 0815  GLUCAP 129* 99 88 66* 87     Critical care time: not applicable     Paulita Fujita, ACNP [facility]  After hours pager: [phone]

## 2020-04-02 NOTE — Progress Notes (Signed)
Physical Therapy Treatment Patient Details Name: [name] MRN: [id number] DOB: 07/05/1923 Today's Date: 04/02/2020    History of Present Illness This 86 y.o. female admitted with AMS.  CXR consistent with PNA.  Also found to have UTI.  PMH includes:  hip fx with ORIF 12/2018, takotsubo cardiomyopathy, aortic stenosis, and mild dementia, macular degeneration, persistent A-Fib, osteoporosis, h/o MI, bradycardia, s/p amputation of finger Rt hand, ventricular tachycardia     PT Comments    Pt supine in bed on arrival, Pt eager to move OOB to recliner chair.  Pt continues to require heavy max-total assistance but does attempt to engage core and extremities.  Continue to recommend SNF placement to improve strength and function to decrease burden of care.     Follow Up Recommendations  SNF     Equipment Recommendations  None recommended by PT    Recommendations for Other Services       Precautions / Restrictions Precautions Precautions: Fall Restrictions Weight Bearing Restrictions: No    Mobility  Bed Mobility Overal bed mobility: Needs Assistance Bed Mobility: Rolling     Supine to sit: Max assist;Total assist     General bed mobility comments: Max assistance/ Total A to move LEs to edge of bed and elevate trunk into a seated position.  Pt continues to require assistance maintain sitting but once hip scooted forward she was able to sit edge of bed with min guard assistance.  Transfers Overall transfer level: Needs assistance Equipment used: None Transfers: Squat Pivot Transfers     Squat pivot transfers: Total assist     General transfer comment: Total assistance +1 to move from bed to recliner chair.  Bed placed in an elevated position and face to face assistance provided for squat pivot from bed to recliner on the R side.  Once in recliner total assistance to scoot posteriorly in recliner seat.  Ambulation/Gait Ambulation/Gait assistance:  (NT-unable)                Stairs             Wheelchair Mobility    Modified Rankin (Stroke Patients Only)       Balance Overall balance assessment: Needs assistance Sitting-balance support: Feet supported Sitting balance-Leahy Scale: Poor Sitting balance - Comments: Pt initially with heavy posterior lean requiring max A for EOB sitting, but progressed to periods of min guard A Postural control: Posterior lean (posterior pelvic tilt)                                  Cognition Arousal/Alertness: Awake/alert Behavior During Therapy: Flat affect Overall Cognitive Status: Impaired/Different from baseline Area of Impairment: Following commands                       Following Commands: Follows one step commands with increased time       General Comments: Pt more alert and talkative this session.  Mild confusion noted but cognition not formally assessed.      Exercises      General Comments        Pertinent Vitals/Pain Pain Assessment: No/denies pain Faces Pain Scale: Hurts little more Pain Location: bil. LEs when moving them  Pain Descriptors / Indicators: Grimacing Pain Intervention(s): Monitored during session;Repositioned    Home Living  Prior Function            PT Goals (current goals can now be found in the care plan section) Acute Rehab PT Goals Patient Stated Goal: family hopeful to get to chair today. Potential to Achieve Goals: Fair Progress towards PT goals: Progressing toward goals    Frequency    Min 2X/week      PT Plan Current plan remains appropriate    Co-evaluation              AM-PAC PT "6 Clicks" Mobility   Outcome Measure  Help needed turning from your back to your side while in a flat bed without using bedrails?: Total Help needed moving from lying on your back to sitting on the side of a flat bed without using bedrails?: Total Help needed moving to and from a bed to a  chair (including a wheelchair)?: Total Help needed standing up from a chair using your arms (e.g., wheelchair or bedside chair)?: Total Help needed to walk in hospital room?: Total Help needed climbing 3-5 steps with a railing? : Total 6 Click Score: 6    End of Session Equipment Utilized During Treatment: Gait belt Activity Tolerance: Patient limited by fatigue Patient left: in chair;with call bell/phone within reach;with chair alarm set (maximove lift pad in place to assist nursing with back to bed transfer.) Nurse Communication: Mobility status;Need for lift equipment PT Visit Diagnosis: Other abnormalities of gait and mobility (R26.89);Unsteadiness on feet (R26.81);Muscle weakness (generalized) (M62.81);Difficulty in walking, not elsewhere classified (R26.2)     Time: 1208-1229 PT Time Calculation (min) (ACUTE ONLY): 21 min  Charges:  $Therapeutic Activity: 8-22 mins                     Erasmo Leventhal , PTA Acute Rehabilitation Services Pager [phone] Office [phone]     [name] 04/02/2020, 12:41 PM

## 2020-04-02 NOTE — Progress Notes (Signed)
Occupational Therapy Treatment Patient Details Name: [name] MRN: [id number] DOB: 20-Jan-1923 Today's Date: 04/02/2020    History of present illness This 86 y.o. female admitted with AMS.  CXR consistent with PNA.  Also found to have UTI.  PMH includes:  hip fx with ORIF 12/2018, takotsubo cardiomyopathy, aortic stenosis, and mild dementia, macular degeneration, persistent A-Fib, osteoporosis, h/o MI, bradycardia, s/p amputation of finger Rt hand, ventricular tachycardia    OT comments  Pt in chair.   Focused on self feeding.  Follow Up Recommendations  SNF    Equipment Recommendations  None recommended by OT    Recommendations for Other Services      Precautions / Restrictions Precautions Precautions: Fall Restrictions Weight Bearing Restrictions: No       Mobility Bed Mobility Overal bed mobility: Needs Assistance Bed Mobility: Rolling     Supine to sit: Max assist;Total assist     General bed mobility comments: pt in chair  Transfers Overall transfer level: Needs assistance Equipment used: None Transfers: Squat Pivot Transfers     Squat pivot transfers: Total assist     General transfer comment: pt in chair    Balance Overall balance assessment: Needs assistance Sitting-balance support: Feet supported Sitting balance-Leahy Scale: Poor Sitting balance - Comments: Pt initially with heavy posterior lean requiring max A for EOB sitting, but progressed to periods of min guard A Postural control: Posterior lean (posterior pelvic tilt)                                 ADL either performed or assessed with clinical judgement   ADL Overall ADL's : Needs assistance/impaired Eating/Feeding: Total assistance;Sitting Eating/Feeding Details (indicate cue type and reason): sitting in chair with BUE propped on pillows                                   General ADL Comments: OT session focused on positioning inchair to increase  I with self feeding.  Pts son reports she [facility] but ptis currrently total A at this time with self feeding due to decerase BUE AROM/Strength               Cognition Arousal/Alertness: Awake/alert Behavior During Therapy: WFL for tasks assessed/performed Overall Cognitive Status: Impaired/Different from baseline Area of Impairment: Following commands                       Following Commands: Follows one step commands with increased time       General Comments: Pt more alert and talkative this session.  Mild confusion noted but cognition not formally assessed.                   Pertinent Vitals/ Pain       Pain Assessment: No/denies pain Faces Pain Scale: Hurts little more Pain Location: bil. LEs when moving them  Pain Descriptors / Indicators: Grimacing Pain Intervention(s): Monitored during session;Repositioned     Prior Functioning/Environment              Frequency  Min 2X/week        Progress Toward Goals  OT Goals(current goals can now be found in the care plan section)  Progress towards OT goals: Progressing toward goals  Acute Rehab OT Goals Patient Stated Goal: family hopeful to get  to chair today.  Plan Discharge plan remains appropriate    Co-evaluation                 AM-PAC OT "6 Clicks" Daily Activity     Outcome Measure   Help from another person eating meals?: Total Help from another person taking care of personal grooming?: Total Help from another person toileting, which includes using toliet, bedpan, or urinal?: Total Help from another person bathing (including washing, rinsing, drying)?: Total Help from another person to put on and taking off regular upper body clothing?: Total Help from another person to put on and taking off regular lower body clothing?: Total 6 Click Score: 6    End of Session Equipment Utilized During Treatment: Oxygen  OT Visit Diagnosis: Unsteadiness on feet  (R26.81);Muscle weakness (generalized) (M62.81)   Activity Tolerance Patient limited by lethargy   Patient Left with call bell/phone within reach;with family/visitor present;in chair   Nurse Communication Mobility status        Time: 5072-2575 OT Time Calculation (min): 16 min  Charges: OT General Charges $OT Visit: 1 Visit OT Treatments $Self Care/Home Management : 8-22 mins  Kari Baars, Lillian [phone] Office- [phone]      Sapna Padron, Edwena Felty D 04/02/2020, 1:51 PM

## 2020-04-02 NOTE — Progress Notes (Signed)
  Speech Language Pathology Treatment: Dysphagia  Patient Details Name: [name] MRN: [id number] DOB: 11-17-1922 Today's Date: 04/02/2020 Time: 4650-3546 SLP Time Calculation (min) (ACUTE ONLY): 18 min  Assessment / Plan / Recommendation Clinical Impression  [name] was seen for therapeutic diet tolerance/upgrade with son present. Pt was nauseated with emesis bag in hand upon SLP arrival. She had just drank Pro-Stat and reported it was awful. SLP encouraged her to trial thin liquid ginger ale and regular cracker. Pt deferred cracker trial this date 2/2 nausea, but took sips of ginger ale. Pt was very alert and conversational. Son reports feeling that her cognition and alertness have returned to baseline since yesterday. She was oriented to all but month, which she stated "it's gotta be the end of May--I turned 86 in May and that's been a few weeks." Son reports he has conversed with her all day and that she has been "back to normal." Pt does have a documented baseline dementia and lives in [place]. D/c cognitive goals. Son brought her partial plate and pt wanted to try regular foods prior to nausea. Based on downgrade of solids being r/t alertness and that having resolved, recommend initiating a regular diet (per baseline) and follow up for tolerance with regular meal to ensure safety and efficiency. Pt/son/RN in agreement. RN instructed to contact SLP service if she has any noted difficulty before our return.    HPI HPI: This 86 y.o. female admitted with AMS.  CXR consistent with PNA.  Also found to have UTI.  PMH includes:  hip fx with ORIF 12/2018, takotsubo cardiomyopathy, aortic stenosis, and mild dementia, macular degeneration, persistent A-Fib, osteoporosis, h/o MI, bradycardia, s/p amputation of finger Rt hand, ventricular tachycardia       SLP Plan  Continue with current plan of care       Recommendations  Diet recommendations: Regular Liquids provided via:  Straw;Cup Medication Administration: Whole meds with puree Supervision: Staff to assist with self feeding Compensations: Small sips/bites;Slow rate;Follow solids with liquid Postural Changes and/or Swallow Maneuvers: Seated upright 90 degrees;Upright 30-60 min after meal                SLP Visit Diagnosis: Dysphagia, unspecified (R13.10) Plan: Continue with current plan of care                    [name], M.S., CCC-SLP Speech-Language Pathologist Acute Rehabilitation Services Pager: [place] 04/02/2020, 2:49 PM

## 2020-04-02 NOTE — Progress Notes (Signed)
[facility])  Referral received to follow pt with community based palliative care once she d/c's back to [facility].  Thank you, Venia Carbon RN, BSN, [facility] Liaison

## 2020-04-02 NOTE — Progress Notes (Signed)
PROGRESS NOTE    [name]  WJX:[id number] DOB: 02/07/1923 DOA: 03/27/2020 PCP: [name], MD    Chief Complaint  Patient presents with  . AMS    Brief Narrative:  HPI per [name] is a 86 y.o. female with PMH of right hip fracture s/p fixation March 2020,Takotsubo cardiomyopathy, aortic stenosis and mild dementia who presented to ED with AMS and admitted for CAP and UTI.  Patient non-verbal currently. All history provided by patient's son. [name], [name], reports that patient lives in a nursing home after her physical decline following hip fracture. At baseline, she walks with a walker and converses normally. Reports only very mild dementia that is usually not noticeable. He went to visit her on Sunday and noticed that she was not quite as mentally clear and her speech was a little different. His sister saw her on Monday and noted similar findings. Yesterday they obtained imaging which was c/w PNA and patient was started on Doxycyline. [name] went to see her today and noticed a marked decline. She was completely disoriented, not speaking and not walking. The NP at the nursing home obtained labs and was concerned for dehydration and gave IVF. They then recommended she come to the ER.   In the ED: Vitals stable initially on room air and then RN placed on 1.5 L O2 after desat to 88%. Labs remarkable for COVID neg, glucose 56, Cr 2.29, WBC 15.3, BNP 244, Trop 10>12, UA with large leuks and > 50 WBCs. Lactate 0.9.   CXR: Airspace consolidation consistent with pneumonia left base with small left pleural effusion. Lungs elsewhere clear. Stable cardiac Prominence.  XR Hip: 1. 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 Bony resorption of the majority of the right femoral head, with bony erosion of the acetabular roof by the dynamic screw. 2. Stable left hip osteoarthritis.  Patient was given IVF, Doxy and  Rocephin and called for admission.   Assessment & Plan:   Principal Problem:   Acute encephalopathy Active Problems:   SIADH (syndrome of inappropriate ADH production) (HCC)   Edema of extremities   Protein-calorie malnutrition, severe   Depression due to dementia (HCC)   CKD (chronic kidney disease) stage 4, GFR 15-29 ml/min (HCC)   Diastolic dysfunction   Pleural effusion   Acute kidney injury superimposed on CKD ([place])   Hypoxemia   CAP (community acquired pneumonia)   Cystitis   Pressure injury of skin   Acute on chronic diastolic CHF (congestive heart failure) ([place])   Acute cystitis with hematuria   HCAP (healthcare-associated pneumonia)   Hypokalemia   Hypernatremia   Palliative care by specialist   Goals of care, counseling/discussion   DNR (do not resuscitate)  1 acute toxic metabolic encephalopathy Likely multifactorial secondary to healthcare associated pneumonia (from SNF), UTI, episodes of hypoglycemia.  Patient noted to have 2 episodes of hypoglycemia with CBGs of 41(03/28/2020).  CT head done negative for any acute abnormalities.  CBGs improving.  Patient seen by speech therapy and patient on a dysphagia 2 diet.  Urine Legionella antigen negative.  Urine pneumococcus antigen negative.  CBG of 66 this morning.  Continue IV cefepime.  D5W discontinued.. Patient noted to be volume overloaded on examination and subsequently placed on IV Lasix with good diuresis.  IV Lasix have been completed and patient back on home regimen torsemide.  Patient improving clinically.  Continue supportive care.  Follow.   2.  HCAP/acute respiratory  failure with hypoxia/left pleural effusion Noted on imaging.  Patient noted to have an allergy to azithromycin.  Urine Legionella antigen negative.  Urine pneumococcus antigen negative.  Blood cultures pending with no growth to date.  Lactate within normal limits.  As patient from a nursing facility IV Rocephin was changed to IV cefepime.  Status post  IV doxycycline.  Worsening with leukocytosis.  Continue IV cefepime, Mucinex, Flonase, Claritin, PPI.  Due to persistent left-sided pleural effusion and some increased work of breathing pulmonary was consulted who assessed the patient.  Ultrasound was placed at left hemithorax today and per pulmonary pleural effusion was very small to tap.  As patient improving clinically pulmonary recommended not to pursue thoracentesis at this time and continue with current treatment regimen.  Continue IV cefepime.  Will follow clinically.  Could likely transition to oral antibiotics on discharge.  3.  Acute renal failure on chronic kidney disease stage 4 Baseline creatinine 1.3.  Creatinine on admission was 2.29 on admission.  Patient noted to have received IV fluids in the nursing home and in the ED.  Patient volume overloaded on examination early on in the hospitalization.  Patient noted to have poor oral intake.  Creatinine improved with diuresis.  Creatinine currently at 0.99.  Patient received IV Lasix during the hospitalization.  Resume home regimen oral torsemide today.  Strict I's and O's.  Daily weights.  Follow.    4.  UTI Urine cultures with multiple species.  Follow.   5.  Acute on chronic diastolic heart failure Patient noted to have a grade 1 diastolic dysfunction per 2D echo 2019.  Patient volume overloaded on examination.  Cardiac enzymes negative.  BNP at 244.9 on admission.  2D echo pending.  Patient was placed on Lasix 20 mg IV every 12 hours.  Patient transitioned back to home regimen oral torsemide.  Strict I's and O's.  Daily weights.  Follow.   6.  Depression Wellbutrin on hold.  Likely resume Wellbutrin on discharge.  7.  Severe protein calorie malnutrition Nutrition consulted.  Magnesium at 2.2.  Phosphorus at 4.2.  Follow.  8.  Stage I pressure injury coccyx, POA Continue current wound care.  9.  SIADH Continue home regimen demeclocycline.  10.  Hypokalemia Secondary to diuresis.   Repleted.  Potassium at 3.5.  11.  Hypernatremia IV Lasix have been completed.  D5W discontinued.  Resume torsemide tomorrow.  Follow.   Pressure Injury 03/27/20 Coccyx Medial Stage 1 -  Intact skin with non-blanchable redness of a localized area usually over a bony prominence. (Active)  03/27/20 2300  Location: Coccyx  Location Orientation: Medial  Staging: Stage 1 -  Intact skin with non-blanchable redness of a localized area usually over a bony prominence.  Wound Description (Comments):   Present on Admission:      Pressure Injury 03/30/20 Heel Right Deep Tissue Pressure Injury - Purple or maroon localized area of discolored intact skin or blood-filled blister due to damage of underlying soft tissue from pressure and/or shear. (Active)  03/30/20 0400  Location: Heel  Location Orientation: Right  Staging: Deep Tissue Pressure Injury - Purple or maroon localized area of discolored intact skin or blood-filled blister due to damage of underlying soft tissue from pressure and/or shear.  Wound Description (Comments):   Present on Admission: Yes     Pressure Injury 03/30/20 Coccyx Left;Medial Stage 2 -  Partial thickness loss of dermis presenting as a shallow open injury with a red, pink wound bed without slough.  Skin tear (Active)  03/30/20 1630  Location: Coccyx  Location Orientation: Left;Medial  Staging: Stage 2 -  Partial thickness loss of dermis presenting as a shallow open injury with a red, pink wound bed without slough.  Wound Description (Comments): Skin tear  Present on Admission: No          DVT prophylaxis: Heparin Code Status: DNR Family Communication: Updated son and daughter-in-law at bedside.  Disposition:   Status is: Inpatient    Dispo: The patient is from: SNF              Anticipated d/c is to: Likely back to SNF with palliative care following.              Anticipated d/c date is: 1-2 days.              Patient currently on IV antibiotics, persistent  pleural effusion.  Worsening leukocytosis.  Not stable for discharge.       Consultants:   Pulmonary: Dr. Lamonte Sakai 04/01/2020  Palliative care  Procedures:   CT head without contrast 03/27/2020  Chest x-ray 03/27/2020  Plain films of right hip and pelvis 03/27/2020  Antimicrobials:   Cefepime 03/28/2020>>>>>  IV doxycycline 03/27/2020>>>>> 04/01/2020  IV Rocephin 03/27/2020>>:>>> 03/28/2020  Oral doxycycline 04/02/2020 x 1 dose.   Subjective: Patient sitting up in chair today.  Alert.  Denies any chest pain.  Some shortness of breath but improving.  No abdominal pain.    Objective: Vitals:   04/01/20 2023 04/02/20 0336 04/02/20 0404 04/02/20 0816  BP:  (!) 120/50    Pulse:  68    Resp:  17    Temp:  98.4 F (36.9 C)    TempSrc:  Oral    SpO2: 98% 97%  94%  Weight:   61.2 kg   Height:        Intake/Output Summary (Last 24 hours) at 04/02/2020 1244 Last data filed at 04/02/2020 0513 Gross per 24 hour  Intake 60 ml  Output 700 ml  Net -640 ml   Filed Weights   03/30/20 0500 03/31/20 0404 04/02/20 0404  Weight: 59.4 kg 64.9 kg 61.2 kg    Examination:  General exam: More alert. Respiratory system: Decreased breath sounds in the left base.  Scattered rhonchorous coarse breath sounds.  Speaking in full sentences.  Cardiovascular system: Regular rate rhythm no murmurs rubs or gallops.  No lower extremity edema.  Gastrointestinal system: Abdomen is soft, nontender, nondistended, positive bowel sounds.  No rebound.  No guarding.  Central nervous system: Alert. No focal neurological deficits. Extremities: No clubbing no cyanosis or edema.  Symmetric 5 x 5 power. Skin: No rashes, lesions or ulcers Psychiatry: Judgement and insight appear fair. Mood & affect unable to assess.     Data Reviewed: I have personally reviewed following labs and imaging studies  CBC: Recent Labs  Lab 03/27/20 0000 03/27/20 0000 03/27/20 1436 03/28/20 0140 03/29/20 0140 03/30/20 0259  03/31/20 0256 04/01/20 0425 04/02/20 0441  WBC 9.2  --  15.3*   < > 12.3* 10.9* 11.1* 14.3* 20.3*  NEUTROABS 7,682  --  13.9*  --   --   --   --   --  16.8*  HGB 12.1   < > 11.9*   < > 11.7* 12.3 12.9 12.9 12.7  HCT 36   < > 36.8   < > 36.0 38.5 40.4 40.2 39.3  MCV  --   --  96.3   < > 96.0  97.0 96.9 97.3 94.9  PLT 203   < > 188   < > 154 148* 136* 143* 156   < > = values in this interval not displayed.    Basic Metabolic Panel: Recent Labs  Lab 03/28/20 0140 03/29/20 1506 03/30/20 1300 03/31/20 0256 03/31/20 1451 04/01/20 0425 04/02/20 0441  NA 145   < > 145 148* 145 147* 145  K 4.0   < > 4.2 3.5 3.6 3.9 3.5  CL 107   < > 107 108 109 107 107  CO2 30   < > 29 31 28 28 27   GLUCOSE 54*   < > 137* 96 102* 106* 84  BUN 68*   < > 28* 25* 23 21 26*  CREATININE 1.73*   < > 1.04* 0.99 0.84 0.82 0.99  CALCIUM 8.3*   < > 7.4* 8.4* 8.0* 8.3* 8.2*  MG 2.2  --   --   --   --   --   --   PHOS 4.2  --   --   --   --   --   --    < > = values in this interval not displayed.    GFR: Estimated Creatinine Clearance: 23.3 mL/min (by C-G formula based on SCr of 0.99 mg/dL).  Liver Function Tests: Recent Labs  Lab 03/27/20 0000  AST 32  ALT 34  ALKPHOS 201*  ALBUMIN 2.9*    CBG: Recent Labs  Lab 04/01/20 2349 04/02/20 0402 04/02/20 0736 04/02/20 0815 04/02/20 1204  GLUCAP 99 88 66* 87 110*     Recent Results (from the past 240 hour(s))  SARS Coronavirus 2 by RT PCR (hospital order, performed in [facility] hospital lab) Nasopharyngeal Nasopharyngeal Swab     Status: None   Collection Time: 03/27/20  2:27 PM   Specimen: Nasopharyngeal Swab  Result Value Ref Range Status   SARS Coronavirus 2 NEGATIVE NEGATIVE Final    Comment: (NOTE) SARS-CoV-2 target nucleic acids are NOT DETECTED. The SARS-CoV-2 RNA is generally detectable in upper and lower respiratory specimens during the acute phase of infection. The lowest concentration of SARS-CoV-2 viral copies this assay can  detect is 250 copies / mL. A negative result does not preclude SARS-CoV-2 infection and should not be used as the sole basis for treatment or other patient management decisions.  A negative result may occur with improper specimen collection / handling, submission of specimen other than nasopharyngeal swab, presence of viral mutation(s) within the areas targeted by this assay, and inadequate number of viral copies (<250 copies / mL). A negative result must be combined with clinical observations, patient history, and epidemiological information. Fact Sheet for Patients:   StrictlyIdeas.no Fact Sheet for Healthcare Providers: BankingDealers.co.za This test is not yet approved or cleared  by the [place] FDA and has been authorized for detection and/or diagnosis of SARS-CoV-2 by FDA under an Emergency Use Authorization (EUA).  This EUA will remain in effect (meaning this test can be used) for the duration of the COVID-19 declaration under Section 564(b)(1) of the Act, 21 U.S.C. section 360bbb-3(b)(1), unless the authorization is terminated or revoked sooner. Performed at [facility], [place] [address], [place], [place] [address]   Urine culture     Status: Abnormal   Collection Time: 03/27/20  5:06 PM   Specimen: Urine, Random  Result Value Ref Range Status   Specimen Description URINE, RANDOM  Final   Special Requests   Final  NONE Performed at [facility], [place] [address] [place], [place] [address]    Culture MULTIPLE SPECIES PRESENT, SUGGEST RECOLLECTION (A)  Final   Report Status 03/28/2020 FINAL  Final  Culture, blood (routine x 2)     Status: None   Collection Time: 03/27/20  7:30 PM   Specimen: BLOOD RIGHT HAND  Result Value Ref Range Status   Specimen Description BLOOD RIGHT HAND  Final   Special Requests   Final    BOTTLES DRAWN AEROBIC AND ANAEROBIC Blood Culture adequate volume   Culture   Final    NO  GROWTH 5 DAYS Performed at [facility], [place] [address] [place], [place] [address]    Report Status 04/01/2020 FINAL  Final  Culture, blood (routine x 2)     Status: None   Collection Time: 03/27/20  7:30 PM   Specimen: BLOOD  Result Value Ref Range Status   Specimen Description BLOOD RIGHT ANTECUBITAL  Final   Special Requests   Final    BOTTLES DRAWN AEROBIC AND ANAEROBIC Blood Culture adequate volume   Culture   Final    NO GROWTH 5 DAYS Performed at [facility], [place] [address], [place],  [address]    Report Status 04/01/2020 FINAL  Final         Radiology Studies: DG CHEST PORT 1 VIEW  Result Date: 04/01/2020 CLINICAL DATA:  Shortness of breath, altered mental status EXAM: PORTABLE CHEST 1 VIEW COMPARISON:  03/27/2020 FINDINGS: Stable cardiomediastinal contours. Atherosclerotic calcification of the aortic knob. Small left pleural effusion with associated left basilar opacity similar in appearance to prior. Right lung appears clear. No pneumothorax. Demineralized osseous structures with chronic arthropathy of the bilateral shoulders and numerous prior kyphoplasty/vertebroplasty changes of the visualized thoracic spine. IMPRESSION: Small left pleural effusion with associated left basilar opacity, similar in appearance to prior. Electronically Signed   By: Davina Poke D.O.   On: 04/01/2020 11:23        Scheduled Meds: . acetaminophen  650 mg Oral BID  . azelastine  2 spray Each Nare Daily  . demeclocycline  150 mg Oral BID  . dextromethorphan-guaiFENesin  1 tablet Oral BID  . doxycycline  100 mg Oral Q12H  . famotidine  10 mg Oral Daily  . feeding supplement (PRO-STAT SUGAR FREE 64)  30 mL Oral TID BM  . fluticasone  2 spray Each Nare Daily  . heparin  5,000 Units Subcutaneous Q8H  . ipratropium-albuterol  3 mL Nebulization BID  . loratadine  10 mg Oral Daily  . multivitamin with minerals  1 tablet Oral Q1200  . pantoprazole  40 mg Oral Q0600    . pregabalin  25 mg Oral BID  . torsemide  20 mg Oral Daily   Continuous Infusions: . ceFEPime (MAXIPIME) IV 2 g (04/02/20 1015)     LOS: 6 days    Time spent: 35 minutes    Irine Seal, MD [facility]   To contact the attending provider between 7A-7P or the covering provider during after hours 7P-7A, please log into the web site [email or web address] and access using universal [place] password for that web site. If you do not have the password, please call the hospital operator.  04/02/2020, 12:44 PM

## 2020-04-02 NOTE — Progress Notes (Signed)
Daily Progress Note   Patient Name: [name]       Date: 04/02/2020 DOB: 05/12/1923  Age: 86 y.o. MRN#: [id number] Attending Physician: Eugenie Filler, MD Primary Care Physician: [name], MD Admit Date: 03/27/2020  Reason for Consultation/Follow-up: Establishing goals of care  Subjective: Patient awake, alert, interacting with son at bedside. Pleasant confusion with baseline dementia. Denies pain or discomfort. Per son, she is more awake and interactive than yesterday. Recognizing family and answering simple questions.   Per RN, oral intake remains poor. She declined breakfast this morning. Tolerating pills with applesauce.  GOC:  F/u GOC with son [name]) and DIL Sharee Pimple) in family conference room. Introduced role of palliative medicine again.   The patient has four children but unfortunately estranged from two twin daughters. Son, [name] and daughter, Vaughan Basta are documented POA's and involved in her care. Vaughan Basta is currently out of town and has deferred decisions to [place] while he is in town. [name] and Vaughan Basta communicate regularly about [place].   Discussed in detail course of hospitalization including diagnoses, interventions, plan of care. Discussed recommendations from pulmonology including f/u today regarding plan. Frankly and compassionate shared concern with worsening WBC count, pleural effusion, and ? Empyema and what this management would entail. Also ongoing poor oral intake and dependent functional status.   I attempted to elicit values and goals of care important to the patient and family. Patient's advance directive was reviewed prior to this meeting. Son confirms DNR code status. [name] shares that his father had similar events prior to his death. A feeding tube was placed  and unfortunately did not change the outcome. [name] and Sharee Pimple do not believe it would be appropriate to consider feeding tube, sharing that Dottie's quality of life has been declining since her fall/hip fracture March 2020 and now dependent functional status. Also, [name] and Sharee Pimple both agree they would likely not consider aggressive management for empyema including chest tube, feeling this would cause more discomfort.    The difference between aggressive medical intervention and comfort care was considered in light of the patient's goals of care. Patient lives at [facility] facility. We briefly discussed consideration of hospice on discharge if they can provide care for her. [name] agrees that hospice care would be best at [facility], where she knows staff  and residents. This has been her home for many months. She has made comments about [facility] being her "last stop." Discussed comfort pathway and comfort feeds.   [name] does share that his mother is perkier today. He plans to bring her partials to the hospital to see if this would assist with meals.   [name] does wish to further discuss plan and pulmonology recommendations today. He is appreciative of PMT provider f/u tomorrow based on his conversation with pulmonology.   PMT contact information given and Hard Choices booklet.    **Updated Dr. Grandville Silos, Marni Griffon and [facility] PCCM NP's, and RN.    Length of Stay: 6  Current Medications: Scheduled Meds:   acetaminophen  650 mg Oral BID   azelastine  2 spray Each Nare Daily   demeclocycline  150 mg Oral BID   dextromethorphan-guaiFENesin  1 tablet Oral BID   famotidine  10 mg Oral Daily   feeding supplement (PRO-STAT SUGAR FREE 64)  30 mL Oral TID BM   fluticasone  2 spray Each Nare Daily   heparin  5,000 Units Subcutaneous Q8H   ipratropium-albuterol  3 mL Nebulization TID   loratadine  10 mg Oral Daily   multivitamin with minerals  1 tablet Oral Q1200    pantoprazole  40 mg Oral Q0600   pregabalin  25 mg Oral BID   torsemide  20 mg Oral Daily    Continuous Infusions:  ceFEPime (MAXIPIME) IV 2 g (04/02/20 1015)    PRN Meds: acetaminophen, albuterol, polyvinyl alcohol  Physical Exam Vitals and nursing note reviewed.  Constitutional:      General: She is awake.     Appearance: She is ill-appearing.  HENT:     Head: Normocephalic and atraumatic.  Pulmonary:     Effort: No tachypnea, accessory muscle usage or respiratory distress.  Neurological:     Mental Status: She is alert.     Comments: Awake, interacting. Pleasant confusion with baseline dementia.             Vital Signs: BP (!) 120/50 (BP Location: Left Arm)    Pulse 68    Temp 98.4 F (36.9 C) (Oral)    Resp 17    Ht 4\' 6"  (1.372 m)    Wt 61.2 kg    SpO2 94%    BMI 32.55 kg/m  SpO2: SpO2: 94 % O2 Device: O2 Device: Room Air O2 Flow Rate: O2 Flow Rate (L/min): 2 L/min  Intake/output summary:   Intake/Output Summary (Last 24 hours) at 04/02/2020 1110 Last data filed at 04/02/2020 0513 Gross per 24 hour  Intake 60 ml  Output 700 ml  Net -640 ml   LBM: Last BM Date: 03/31/20 Baseline Weight: Weight: 31.9 kg Most recent weight: Weight: 61.2 kg       Palliative Assessment/Data: PPS 30%      Patient Active Problem List   Diagnosis Date Noted   Palliative care by specialist    Goals of care, counseling/discussion    DNR (do not resuscitate)    Hypokalemia    Hypernatremia    Pressure injury of skin 03/28/2020   Acute on chronic diastolic CHF (congestive heart failure) ([place]) 03/28/2020   Acute cystitis with hematuria    HCAP (healthcare-associated pneumonia)    Altered mental status 03/27/2020   Acute kidney injury superimposed on CKD ([place]) 03/27/2020   Hypoxemia 03/27/2020   CAP (community acquired pneumonia) 03/27/2020   Cystitis 03/27/2020   Acute encephalopathy 03/27/2020  Rash 03/25/2020   Sunburn of second degree 02/29/2020    Right wrist fracture 01/03/2020   Fall 12/25/2019   Right hip pain 11/15/2019   Slow transit constipation 09/10/2019   Chronic bronchitis ([place]) 07/21/2019   Pleural effusion 07/21/2019   CKD (chronic kidney disease) stage 4, GFR 15-29 ml/min (HCC) 08/12/8526   Diastolic dysfunction 78/24/2353   Depression due to dementia ([place]) 06/06/2019   Hallucination, visual 03/28/2019   Weight gain 03/28/2019   Delirium 03/04/2019   Cognitive impairment 03/04/2019   Anemia 02/21/2019   Nondisplaced fracture of distal phalanx of right lesser toe(s), initial encounter for closed fracture 12/23/2018   Closed right hip fracture ([place]) 12/21/2018   Closed right hip fracture, initial encounter ([place]) 12/21/2018   Protein-calorie malnutrition, severe 12/21/2018   Closed intertrochanteric fracture of hip, right, initial encounter ([place])    Edema of extremities 04/12/2018   Aortic stenosis, mild 03/03/2016   MVP (mitral valve prolapse) 03/03/2016   Bradycardia 08/28/2015   Takotsubo cardiomyopathy    Old MI (myocardial infarction)    Back pain    SIADH (syndrome of inappropriate ADH production) ([place])    Ventricular tachycardia ([place])    GI bleed due to NSAIDs    Hyperlipidemia    Persistent atrial fibrillation ([place])    Umbilical hernia 61/44/3154   S/P cholecystectomy 12/03/2011   Polymyalgia rheumatica ([place]) 10/08/2011   Essential hypertension, benign 10/08/2011   Esophageal reflux 10/08/2011    Palliative Care Assessment & Plan   Patient Profile: Per intake H&P --> [name] a 86 y.o.femalewith PMH ofright hip fracture s/p fixation March 2020,Takotsubo cardiomyopathy, aortic stenosisand mild dementia who presented to ED with AMS and admitted for CAP and UTI. Ongoing goals of care conversations in the setting of recurrent hospitalizations and clinical decline.   PCCM consult 6/14 with chest xray with volume loss and infiltrate at left base  with associated pleural effusion. Questionable empyema with worsening WBC count.    Assessment: Acute toxic metabolic encephalopathy HCAP Acute respiratory failure with hypoxia Left pleural effusion Diastolic dysfunction, a/c diastolic heart failure ARF on CKD stage IV UTI Severe protein calorie malnutrition Depression SIADH Hypernatremia Hypokalemia  Recommendations/Plan:  Continue current plan of care and medical management.  DNR, NO feeding tube.   Continue comfort feeds and dysphagia diet. Patient needs assist and encouragement with meals.   Pending pulmonology f/u and recommendations today. Son willing to pursue less invasive workup (ECHO/ultrasound for pleural effusion) and possible consideration of thoracentesis per PCCM recommendations but does seem to be leaning against aggressive management (chest tube) for possible empyema, feeling this will not help his mother's quality of life with dependent baseline prior to admission.   PMT provider will f/u in AM after further PCCM recommendations. Son open to further hospice discussions and consideration of comfort focused pathway following his conversation with PCCM today.    Code Status: DNR   Code Status Orders  (From admission, onward)         Start     Ordered   03/27/20 2312  Do not attempt resuscitation (DNR)  Continuous       Question Answer Comment  In the event of cardiac or respiratory ARREST Do not call a code blue   In the event of cardiac or respiratory ARREST Do not perform Intubation, CPR, defibrillation or ACLS   In the event of cardiac or respiratory ARREST Use medication by any route, position, wound care, and other measures to relive pain and suffering. May  use oxygen, suction and manual treatment of airway obstruction as needed for comfort.   Comments DNR/DNI      03/27/20 2311        Code Status History    Date Active Date Inactive Code Status Order ID Comments User Context   12/21/2018 0330  12/26/2018 1438 DNR [id number]  Rise Patience, MD ED   12/21/2018 [phone] 12/21/2018 0329 Full Code [id number]  Rise Patience, MD ED   Advance Care Planning Activity    Advance Directive Documentation     Most Recent Value  Type of Advance Directive Healthcare Power of [place], Living will, Out of facility DNR (pink MOST or yellow form)  Pre-existing out of facility DNR order (yellow form or pink MOST form) Yellow form placed in chart (order not valid for inpatient use)  "MOST" Form in Place? --       Prognosis:   Poor long-term prognosis  Discharge Planning:  To Be Determined  Care plan was discussed with RN, Dr. Grandville Silos, Marni Griffon PCCM NP, son/daughter-in-law  Thank you for allowing the Palliative Medicine Team to assist in the care of this patient.   Time In: 1000 Time Out: 1100 Total Time 60 Prolonged Time Billed no      Greater than 50%  of this time was spent counseling and coordinating care related to the above assessment and plan.  Ihor Dow, DNP, FNP-C Palliative Medicine Team  Phone: [phone] Fax: [phone]  Please contact Palliative Medicine Team phone at [phone] for questions and concerns.

## 2020-04-02 NOTE — Progress Notes (Signed)
PT Cancellation Note  Patient Details Name: NAESHA BUCKALEW MRN: [id number] DOB: Jun 13, 1923   Cancelled Treatment:    Reason Eval/Treat Not Completed: (P) Patient at procedure or test/unavailable (off unit for testing will f/u per POC.)   Samuel Mcpeek J Stann Mainland 04/02/2020, 11:46 AM

## 2020-04-02 NOTE — Progress Notes (Signed)
  Echocardiogram 2D Echocardiogram has been performed.  Jennette Dubin 04/02/2020, 12:01 PM

## 2020-04-03 LAB — BASIC METABOLIC PANEL
Anion gap: 10 (ref 5–15)
BUN: 38 mg/dL — ABNORMAL HIGH (ref 8–23)
CO2: 28 mmol/L (ref 22–32)
Calcium: 7.9 mg/dL — ABNORMAL LOW (ref 8.9–10.3)
Chloride: 104 mmol/L (ref 98–111)
Creatinine, Ser: 1.24 mg/dL — ABNORMAL HIGH (ref 0.44–1.00)
GFR calc Af Amer: 43 mL/min — ABNORMAL LOW (ref >60)
GFR calc non Af Amer: 37 mL/min — ABNORMAL LOW (ref >60)
Glucose, Bld: 85 mg/dL (ref 70–99)
Potassium: 3.3 mmol/L — ABNORMAL LOW (ref 3.5–5.1)
Sodium: 142 mmol/L (ref 135–145)

## 2020-04-03 LAB — CBC WITH DIFFERENTIAL/PLATELET
Abs Immature Granulocytes: 0.13 10*3/uL — ABNORMAL HIGH (ref 0.00–0.07)
Basophils Absolute: 0 10*3/uL (ref 0.0–0.1)
Basophils Relative: 0 %
Eosinophils Absolute: 0.1 10*3/uL (ref 0.0–0.5)
Eosinophils Relative: 1 %
HCT: 34 % — ABNORMAL LOW (ref 36.0–46.0)
Hemoglobin: 11.2 g/dL — ABNORMAL LOW (ref 12.0–15.0)
Immature Granulocytes: 1 %
Lymphocytes Relative: 7 %
Lymphs Abs: 1.5 10*3/uL (ref 0.7–4.0)
MCH: 30.9 pg (ref 26.0–34.0)
MCHC: 32.9 g/dL (ref 30.0–36.0)
MCV: 93.9 fL (ref 80.0–100.0)
Monocytes Absolute: 1.5 10*3/uL — ABNORMAL HIGH (ref 0.1–1.0)
Monocytes Relative: 7 %
Neutro Abs: 17.8 10*3/uL — ABNORMAL HIGH (ref 1.7–7.7)
Neutrophils Relative %: 84 %
Platelets: 171 10*3/uL (ref 150–400)
RBC: 3.62 MIL/uL — ABNORMAL LOW (ref 3.87–5.11)
RDW: 15 % (ref 11.5–15.5)
WBC: 21 10*3/uL — ABNORMAL HIGH (ref 4.0–10.5)
nRBC: 0 % (ref 0.0–0.2)

## 2020-04-03 LAB — GLUCOSE, CAPILLARY
Glucose-Capillary: 114 mg/dL — ABNORMAL HIGH (ref 70–99)
Glucose-Capillary: 123 mg/dL — ABNORMAL HIGH (ref 70–99)
Glucose-Capillary: 82 mg/dL (ref 70–99)
Glucose-Capillary: 90 mg/dL (ref 70–99)

## 2020-04-03 MED ORDER — ENSURE ENLIVE PO LIQD
237.0000 mL | Freq: Two times a day (BID) | ORAL | Status: DC
  Administered 2020-04-03: 237 mL via ORAL

## 2020-04-03 MED ORDER — CEPHALEXIN 250 MG PO CAPS: 250.0000 mg | ORAL_CAPSULE | Freq: Four times a day (QID) | ORAL | 0 refills | Status: AC

## 2020-04-03 NOTE — Progress Notes (Signed)
Nutrition Follow-up  DOCUMENTATION CODES:   Severe malnutrition in context of chronic illness  INTERVENTION:   -D/c Prostat -Continue MVI with minerals daily -Ensure Enlive po BID, each supplement provides 350 kcal and 20 grams of protein -Continue MVI with minerals daily -Continue Magic cup TID with meals, each supplement provides 290 kcal and 9 grams of protein -Continue Hormel Shake TID with meals, each supplement provides 520 kcals and 22 grams protein  NUTRITION DIAGNOSIS:   Severe Malnutrition related to chronic illness (dementia) as evidenced by moderate fat depletion, severe fat depletion, moderate muscle depletion, severe muscle depletion.  Ongoing  GOAL:   Patient will meet greater than or equal to 90% of their needs  Progressing  MONITOR:   PO intake, Supplement acceptance, Diet advancement, Labs, Weight trends, Skin, I & O's  REASON FOR ASSESSMENT:   Consult Assessment of nutrition requirement/status  ASSESSMENT:   86 y.o. female with PMH of right hip fracture s/p fixation March 2020, Takotsubo cardiomyopathy, aortic stenosis and mild dementia who presented to ED with AMS and admitted for CAP and UTI.  6/10- s/p BSE- advanced to dysphagia 2 diet with thin liquids 6/15- s/p BSE- advanced to regular diet   Reviewed I/O's: +90 ml x 24 hours and +1 L x 24 hours  UOP: 400 ml x 24 hours  Attempted to speak with pt via phone, however, no answer.   Pt diet was advanced to regular yesterday after SLP re-eval. Pt's son had brought in dentures in hopes to advancing diet. Pt does not like Prostat supplements. She is more alert per chart review.   Meal completion remains poor; noted po 10-40%.   Per MD notes, plan to d/c back to [facility] with Palliative Care services.   Labs reviewed: K: 3.3, CBGS: 75-90.   Diet Order:   Diet Order            Diet regular Room service appropriate? Yes; Fluid consistency: Thin  Diet effective now                  EDUCATION NEEDS:   Not appropriate for education at this time  Skin:  Skin Assessment: Skin Integrity Issues: Skin Integrity Issues:: Stage I, Stage III, DTI DTI: rt heel Stage I: coccyx Stage III: coccyx  Last BM:  03/31/20  Height:   Ht Readings from Last 1 Encounters:  03/27/20 4\' 6"  (1.372 m)    Weight:   Wt Readings from Last 1 Encounters:  04/03/20 55.8 kg   BMI:  Body mass index is 29.66 kg/m.  Estimated Nutritional Needs:   Kcal:  1700-1900  Protein:  80-95 grams  Fluid:  > 1.7 L    Loistine Chance, RD, LDN, [place] Registered Dietitian II Certified Diabetes Care and Education Specialist Please refer to [facility] for RD and/or RD on-call/weekend/after hours pager

## 2020-04-03 NOTE — TOC Progression Note (Signed)
Transition of Care [facility]) - Progression Note    Patient Details  Name: [name] MRN: [id number] Date of Birth: 27-Aug-1923  [facility] CM/SW [place], [place] Phone Number: 04/03/2020, 11:06 AM  Clinical Narrative:    Per MD pt stable for d/c today. CSW has followed up with PMT regarding disposition of hospice vs palliative at [facility]. Yates Decamp in admissions aware and following. She will be able to accept pt back regardless of what is recommended. TOC team following.    Expected Discharge Plan: [place] Barriers to Discharge: Continued Medical Work up, Ship broker  Expected Discharge Plan and Services Expected Discharge Plan: [place] In-house Referral: Clinical Social Work Discharge Planning Services: CM Consult Post Acute Care Choice: Resumption of Product/process development scientist, [place] arrangements for the past 2 months: [place]  Readmission Risk Interventions Readmission Risk Prevention Plan 03/28/2020  Transportation Screening Complete  PCP or Specialist Appt within 5-7 Days Complete  Home Care Screening Not Complete  Home Care Screening Not Completed Comments SNF resident  Medication Review (RN CM) Referral to Pharmacy  Some recent data might be hidden

## 2020-04-03 NOTE — Progress Notes (Signed)
Daily Progress Note   Patient Name: [name]       Date: 04/03/2020 DOB: 1923/03/04  Age: 86 y.o. MRN#: [id number] Attending Physician: Little Ishikawa, MD Primary Care Physician: [name], MD Admit Date: 03/27/2020  Reason for Consultation/Follow-up: Disposition, Establishing goals of care, Non pain symptom management, Pain control and Psychosocial/spiritual support  Subjective: Patient lying in bed awake and alert. She was able to interact and answer non-complex questions. The RN was in the room administering medications during visit and stated the patient had eaten a small amount of breakfast. Patient denies pain. No family was at bedside.   GOC:  Called and spoke with patient's son/[name] over the phone. Provided update on pulmonary recommendation to not pursue thoracentesis, which he was agreeable with. Also provided update that patient was medically stable enough per attending MD to be discharged today with oral antibiotics as well as continuing torsemide. [name]'s goal was to get [name] back to [facility]. Discussed and educated on outpatient options of Palliative Care vs hospice support at discharge. [name] wanted to call and discuss these options with [name] in admissions at [facility]. He stated he feels as though he would prefer Palliative Care outpatient rather than hospice at this time, but he would call PMT back this afternoon after discussion with [name] to make sure they could support this decision.   Spoke with and updated [name] in [facility] - she explained referral for outpatient palliative care has been previously reviewed and completed. They went with [facility] preferred, which was [facility].    Length of Stay: 7  Current  Medications: Scheduled Meds:  . acetaminophen  650 mg Oral BID  . azelastine  2 spray Each Nare Daily  . demeclocycline  150 mg Oral BID  . dextromethorphan-guaiFENesin  1 tablet Oral BID  . famotidine  10 mg Oral Daily  . feeding supplement (ENSURE ENLIVE)  237 mL Oral BID BM  . fluticasone  2 spray Each Nare Daily  . heparin  5,000 Units Subcutaneous Q8H  . ipratropium-albuterol  3 mL Nebulization BID  . loratadine  10 mg Oral Daily  . multivitamin with minerals  1 tablet Oral Q1200  . pantoprazole  40 mg Oral Q0600  . pregabalin  25 mg Oral BID  . torsemide  20  mg Oral Daily    Continuous Infusions: . ceFEPime (MAXIPIME) IV 2 g (04/03/20 1026)    PRN Meds: acetaminophen, albuterol, polyvinyl alcohol  Physical Exam Vitals and nursing note reviewed.  Constitutional:      General: She is awake.     Appearance: She is ill-appearing.  HENT:     Head: Normocephalic and atraumatic.  Pulmonary:     Effort: No tachypnea, accessory muscle usage or respiratory distress.  Neurological:     Mental Status: She is alert.     Comments: Awake, interacting. Pleasant confusion with baseline dementia.             Vital Signs: BP (!) 130/54 (BP Location: Left Arm)   Pulse 72   Temp 98.5 F (36.9 C) (Oral)   Resp 18   Ht 4\' 6"  (1.372 m)   Wt 55.8 kg   SpO2 92%   BMI 29.66 kg/m  SpO2: SpO2: 92 % O2 Device: O2 Device: Room Air O2 Flow Rate: O2 Flow Rate (L/min): 2 L/min  Intake/output summary:   Intake/Output Summary (Last 24 hours) at 04/03/2020 1136 Last data filed at 04/03/2020 0810 Gross per 24 hour  Intake 490 ml  Output 400 ml  Net 90 ml   LBM: Last BM Date: 03/31/20 Baseline Weight: Weight: 31.9 kg Most recent weight: Weight: 55.8 kg       Palliative Assessment/Data: PPS 40%      Patient Active Problem List   Diagnosis Date Noted  . Palliative care by specialist   . Goals of care, counseling/discussion   . DNR (do not resuscitate)   . Hypokalemia   .  Hypernatremia   . Pressure injury of skin 03/28/2020  . Acute on chronic diastolic CHF (congestive heart failure) ([place]) 03/28/2020  . Acute cystitis with hematuria   . HCAP (healthcare-associated pneumonia)   . Altered mental status 03/27/2020  . Acute kidney injury superimposed on CKD ([place]) 03/27/2020  . Hypoxemia 03/27/2020  . CAP (community acquired pneumonia) 03/27/2020  . Cystitis 03/27/2020  . Acute encephalopathy 03/27/2020  . Rash 03/25/2020  . Sunburn of second degree 02/29/2020  . Right wrist fracture 01/03/2020  . Fall 12/25/2019  . Right hip pain 11/15/2019  . Slow transit constipation 09/10/2019  . Chronic bronchitis ([place]) 07/21/2019  . Pleural effusion 07/21/2019  . CKD (chronic kidney disease) stage 4, GFR 15-29 ml/min (HCC) 06/23/2019  . Diastolic dysfunction 41/58/3094  . Depression due to dementia ([place]) 06/06/2019  . Hallucination, visual 03/28/2019  . Weight gain 03/28/2019  . Delirium 03/04/2019  . Cognitive impairment 03/04/2019  . Anemia 02/21/2019  . Nondisplaced fracture of distal phalanx of right lesser toe(s), initial encounter for closed fracture 12/23/2018  . Closed right hip fracture ([place]) 12/21/2018  . Closed right hip fracture, initial encounter ([place]) 12/21/2018  . Protein-calorie malnutrition, severe 12/21/2018  . Closed intertrochanteric fracture of hip, right, initial encounter ([place])   . Edema of extremities 04/12/2018  . Aortic stenosis, mild 03/03/2016  . MVP (mitral valve prolapse) 03/03/2016  . Bradycardia 08/28/2015  . Takotsubo cardiomyopathy   . Old MI (myocardial infarction)   . Back pain   . SIADH (syndrome of inappropriate ADH production) ([place])   . Ventricular tachycardia ([place])   . GI bleed due to NSAIDs   . Hyperlipidemia   . Persistent atrial fibrillation ([place])   . Umbilical hernia 07/68/0881  . S/P cholecystectomy 12/03/2011  . Polymyalgia rheumatica ([place]) 10/08/2011  . Essential hypertension, benign 10/08/2011  .  Esophageal  reflux 10/08/2011    Palliative Care Assessment & Plan   Patient Profile: Per intake H&P --> Cheryl N Christiansenis a 86 y.o.femalewith PMH ofright hip fracture s/p fixation March 2020,Takotsubo cardiomyopathy, aortic stenosisand mild dementia who presented to ED with AMS and admitted for CAP and UTI. Ongoing goals of care conversations in the setting of recurrent hospitalizations and clinical decline.   PCCM consult 6/14 with chest xray with volume loss and infiltrate at left base with associated pleural effusion. Questionable empyema with worsening WBC count.    Assessment: Acute toxic metabolic encephalopathy HCAP Acute respiratory failure with hypoxia Left pleural effusion Diastolic dysfunction, a/c diastolic heart failure ARF on CKD stage IV UTI Severe protein calorie malnutrition Depression SIADH Hypernatremia Hypokalemia  Recommendations/Plan:  Continue current plan of care and medical management.  Continue DNR/DNI, NO feeding tube.   Continue comfort feeds and dysphagia diet. Patient needs assist and encouragement with meals.   Son would like for patient to return to [facility] - he is to follow up with PMT this afternoon on decision for outpatient Palliative vs hospice. He seems to prefer Palliative follow up per discussion this morning. SW following and aware.  Code Status: DNR   Code Status Orders  (From admission, onward)         Start     Ordered   03/27/20 2312  Do not attempt resuscitation (DNR)  Continuous       Question Answer Comment  In the event of cardiac or respiratory ARREST Do not call a "code blue"   In the event of cardiac or respiratory ARREST Do not perform Intubation, CPR, defibrillation or ACLS   In the event of cardiac or respiratory ARREST Use medication by any route, position, wound care, and other measures to relive pain and suffering. May use oxygen, suction and manual treatment of airway obstruction as needed  for comfort.   Comments DNR/DNI      03/27/20 2311        Code Status History    Date Active Date Inactive Code Status Order ID Comments User Context   12/21/2018 0330 12/26/2018 1438 DNR [id number]  Rise Patience, MD ED   12/21/2018 [id number] 12/21/2018 0329 Full Code [id number]  Rise Patience, MD ED   Advance Care Planning Activity    Advance Directive Documentation     Most Recent Value  Type of Advance Directive Healthcare Power of [place], Living will, Out of facility DNR (pink MOST or yellow form)  Pre-existing out of facility DNR order (yellow form or pink MOST form) Yellow form placed in chart (order not valid for inpatient use)  "MOST" Form in Place? --       Prognosis:   Poor long-term prognosis   Discharge Planning:  [place] for rehab with Palliative care service follow-up  Care plan was discussed with RN, son/[name], SW  Thank you for allowing the Palliative Medicine Team to assist in the care of this patient.   Time In:  1125 Time Out:  1145 Total Time  20 minutes Prolonged Time Billed no       Greater than 50% of this time was spent counseling and coordinating care related to the above assessment and plan.  Ihor Dow, DNP, FNP-C Palliative Medicine Team  Phone: [phone] Fax: [phone]  Please contact Palliative Medicine Team phone at [phone] for questions and concerns.

## 2020-04-03 NOTE — Social Work (Signed)
Clinical Social Worker facilitated patient discharge including contacting patient family and facility to confirm patient discharge plans.  Clinical information faxed to facility and family agreeable with plan.  CSW arranged ambulance transport via [facility] to [place] RN to call [phone] and ask for Ms. Drawdy's RN with report prior to discharge.  Clinical Social Worker will sign off for now as social work intervention is no longer needed. Please consult [place] again if new need arises.  Westley Hummer, MSW, LCSW Clinical Social Worker

## 2020-04-03 NOTE — Plan of Care (Signed)
  Problem: Health Behavior/Discharge Planning: Goal: Ability to manage health-related needs will improve Outcome: Progressing  Aeb pt participates in poc Problem: Clinical Measurements: Goal: Ability to maintain clinical measurements within normal limits will improve Outcome: Progressing  Aeb no new s/s Problem: Education: Goal: Knowledge of General Education information will improve Description: Including pain rating scale, medication(s)/side effects and non-pharmacologic comfort measures Outcome: Not Progressing  Aeb pt unable to verbalize poc this AM

## 2020-04-03 NOTE — TOC Transition Note (Signed)
Transition of Care [facility]) - CM/SW Discharge Note   Patient Details  Name: [name] MRN: [id number] Date of Birth: 1923/07/28  Transition of [facility]) CM/SW Contact:  Alexander Mt, LCSW Phone Number: 04/03/2020, 3:03 PM   Clinical Narrative:    Pt auth received from [facility] [phone]), 6/16-6/18. [place] aware, pt family preference remains for some rehab with palliative f/u. Referral given to [facility] for d/c today. [place] with [facility] aware, pt son [name] aware and will meet pt at [facility]. [facility] papers completed, they are here to pick pt up.     Final next level of care: Skilled Nursing Facility Barriers to Discharge: Barriers Resolved   Patient Goals and CMS Choice Patient states their goals for this hospitalization and ongoing recovery are:: return to [facility] when ready   Choice offered to / list presented to : Adult Children  Discharge Placement   Existing PASRR number confirmed : 03/28/20          Patient chooses bed at: [facility] Patient to be transferred to facility by: [place] Name of family member notified: pt son [name] Patient and family notified of of transfer: 04/03/20  Discharge Plan and Services In-house Referral: Clinical Social Work Discharge Planning Services: CM Consult Post Acute Care Choice: Resumption of Product/process development scientist, [place]:  (Palliative) [place] Agency: Hospice and [place] Date [place]: 04/03/20 Time [place]: 42 Representative spoke with at [place]: Farrel Gordon  Readmission Risk Interventions Readmission Risk Prevention Plan 03/28/2020  Transportation Screening Complete  PCP or Specialist Appt within 5-7 Days Complete  PCP or Specialist Appt within 3-5 Days Complete  Home Care Screening Not Complete  Home Care Screening Not Completed Comments SNF resident  Medication Review (RN CM) Referral  to Pharmacy  HRI or Home Care Consult Complete  Social Work Consult for [place] Planning/Counseling Complete  Palliative Care Screening Complete  Medication Review Press photographer) Referral to Pharmacy  Some recent data might be hidden

## 2020-04-03 NOTE — Discharge Summary (Signed)
Physician Discharge Summary  [name] IFO:[id number] DOB: May 06, 1923 DOA: 03/27/2020  PCP: [name], MD  Admit date: 03/27/2020 Discharge date: 04/03/2020  Admitted From: SNF Disposition: SNF  Recommendations for Outpatient Follow-up:  1. Follow up with PCP in 1-2 weeks; palliative care to see hopefully later this week at facility 2. Please obtain BMP/CBC in one week  Discharge Condition: Stable CODE STATUS: DNR Diet recommendation: As tolerated Brief/Interim Summary: Cheryl N Christiansenis a 86 y.o.femalewith PMH ofright hip fracture s/p fixation March 2020,Takotsubo cardiomyopathy, aortic stenosisand mild dementia who presented to ED with AMS and admitted for CAP and UTI. Patient non-verbal currently. All history provided by patient's son. [name], [name], reports that patient lives in a nursing home after her physical decline following hip fracture. At baseline, she walks with a walker and converses normally. Reports only very mild dementia that is usually not noticeable. He went to visit her on Sunday and noticed that she was not quite as mentally clear and her speech was a little different. His sister saw her on Monday and noted similar findings. Yesterday they obtained imaging which was c/w PNA and patient was started on Doxycyline. [name] went to see her today and noticed a marked decline. She was completely disoriented, not speaking and not walking. The NP at the nursing home obtained labs and was concerned for dehydration and gave IVF. They then recommended she come to the ER.  Patient admitted as above with concern over community-acquired pneumonia and UTI resulting in acute metabolic encephalopathy that appear to be resolving.  Patient to be discharged on remainder of antibiotics as below, patient's respiratory status appears quite well, patient denies any shortness of breath, cough, chest pain sputum production or fevers overnight.  Patient's mental status  appears to be markedly improved from previous, this morning lengthy discussion about need for medication compliance, patient understood that she was being discharged back to facility where she currently lives, was at least alert and oriented to person and place.  Palliative care has been consulted and will follow in the outpatient setting, given patient's advanced age and multiple comorbid conditions.  Encouraged patient to continue to increase p.o. intake of both free water and food as tolerated given her profound dehydration upon admission.  Patient otherwise stable and agreeable for discharge back to facility.  Family was unavailable over the phone at time of discharge.  Discharge Diagnoses:  Principal Problem:   Acute encephalopathy Active Problems:   SIADH (syndrome of inappropriate ADH production) (HCC)   Edema of extremities   Protein-calorie malnutrition, severe   Depression due to dementia (HCC)   CKD (chronic kidney disease) stage 4, GFR 15-29 ml/min (HCC)   Diastolic dysfunction   Pleural effusion   Acute kidney injury superimposed on CKD (HCC)   Hypoxemia   CAP (community acquired pneumonia)   Cystitis   Pressure injury of skin   Acute on chronic diastolic CHF (congestive heart failure) ([place])   Acute cystitis with hematuria   HCAP (healthcare-associated pneumonia)   Hypokalemia   Hypernatremia   Palliative care by specialist   Goals of care, counseling/discussion   DNR (do not resuscitate)    Discharge Instructions  Discharge Instructions    Diet - low sodium heart healthy   Complete by: As directed    Discharge wound care:   Complete by: As directed    Per wound care   Increase activity slowly   Complete by: As directed      Allergies as of  04/03/2020      Reactions   Amlodipine Swelling   To feet and ankles    Avelox [moxifloxacin Hcl In Nacl] Other (See Comments)   Pt does not remember   Biaxin [clarithromycin]    Ceftin [cefuroxime Axetil]    Duragesic  Disc Transdermal System [fentanyl] Other (See Comments)   Reaction unknown   Forteo [parathyroid Hormone (recomb)] Other (See Comments)   Made skin dry   Morphine And Related Other (See Comments)   "My Sister and daughter can,t take it. I've had it before without problems."   Teriparatide    Lidocaine Rash      Medication List    STOP taking these medications   amoxicillin-clavulanate 500-125 MG tablet Commonly known as: AUGMENTIN     TAKE these medications   acetaminophen 500 MG tablet Commonly known as: TYLENOL Take 500 mg by mouth every 6 (six) hours as needed for moderate pain. Every 6hrs.   acetaminophen 325 MG tablet Commonly known as: TYLENOL Take 650 mg by mouth 2 (two) times daily.   albuterol (2.5 MG/3ML) 0.083% nebulizer solution Commonly known as: PROVENTIL Take 2.5 mg by nebulization as directed. Take 1 neb inhalation every 6 hours and Take 1 neb inhalation every 6 hours PRN FOR SOB AND WHEEZING   azelastine 0.1 % nasal spray Commonly known as: ASTELIN Place 2 sprays into both nostrils daily.   budesonide-formoterol 80-4.5 MCG/ACT inhaler Commonly known as: SYMBICORT Inhale 2 puffs into the lungs 2 (two) times daily.   buPROPion 150 MG 12 hr tablet Commonly known as: WELLBUTRIN SR Take 1 tablet (150 mg total) by mouth every morning.   cephALEXin 250 MG capsule Commonly known as: KEFLEX Take 1 capsule (250 mg total) by mouth 4 (four) times daily for 3 days.   cetirizine 10 MG tablet Commonly known as: ZYRTEC Take 10 mg by mouth daily.   demeclocycline 150 MG tablet Commonly known as: DECLOMYCIN Take 150 mg by mouth 2 (two) times daily.   diclofenac sodium 1 % Gel Commonly known as: VOLTAREN Apply 1 application topically 4 (four) times daily as needed (pain).   famotidine 20 MG tablet Commonly known as: PEPCID Take 20 mg by mouth daily.   feeding supplement (PRO-STAT SUGAR FREE 64) Liqd Take 30 mLs by mouth daily.   fluticasone 50 MCG/ACT  nasal spray Commonly known as: FLONASE Place 2 sprays into both nostrils daily.   Iron 325 (65 Fe) MG Tabs Take 1 tablet by mouth every other day. Give with meals   Lubricating Eye Drops 0.5-0.9 % ophthalmic solution Generic drug: carboxymethylcellul-glycerin Place 1 drop into both eyes 3 (three) times daily as needed for dry eyes. For allergies   memantine 5 MG tablet Commonly known as: NAMENDA Take 5 mg by mouth 2 (two) times daily.   nystatin cream Commonly known as: MYCOSTATIN Apply 1 application topically 2 (two) times daily.   ondansetron 4 MG disintegrating tablet Commonly known as: ZOFRAN-ODT Take 4 mg by mouth every 6 (six) hours as needed for nausea or vomiting.   pantoprazole 40 MG tablet Commonly known as: PROTONIX Take 40 mg by mouth daily.   polyethylene glycol 17 g packet Commonly known as: MIRALAX / GLYCOLAX Take 17 g by mouth daily as needed for mild constipation.   pregabalin 25 MG capsule Commonly known as: LYRICA Take 1 capsule (25 mg total) by mouth 2 (two) times daily.   torsemide 20 MG tablet Commonly known as: DEMADEX Take 20 mg by mouth daily.  triamcinolone cream 0.5 % Commonly known as: KENALOG Apply 1 application topically 2 (two) times daily.   zinc oxide 20 % ointment Apply 1 application topically as needed for irritation. To buttocks after every incontinent episode and as needed for redness. May keep at bedside            Discharge Care Instructions  (From admission, onward)         Start     Ordered   04/03/20 0000  Discharge wound care:       Comments: Per wound care   04/03/20 1207          Contact information for after-discharge care    Destination    [facility] .   Service: Skilled Nursing Contact information: [place] [phone]                 Allergies  Allergen Reactions  . Amlodipine Swelling    To feet and ankles   . Avelox  [Moxifloxacin Hcl In Nacl] Other (See Comments)    Pt does not remember   . Biaxin [Clarithromycin]   . Ceftin [Cefuroxime Axetil]   . Duragesic Disc Transdermal System [Fentanyl] Other (See Comments)    Reaction unknown  . Forteo [Parathyroid Hormone (Recomb)] Other (See Comments)    Made skin dry  . Morphine And Related Other (See Comments)    "My Sister and daughter can,t take it. I've had it before without problems."  . Teriparatide   . Lidocaine Rash    Consultations:  Palliative care/hospice   Procedures/Studies: CT HEAD WO CONTRAST  Result Date: 03/27/2020 CLINICAL DATA:  86 year old female with encephalopathy. EXAM: CT HEAD WITHOUT CONTRAST TECHNIQUE: Contiguous axial images were obtained from the base of the skull through the vertex without intravenous contrast. COMPARISON:  Head CT 11/14/2005. FINDINGS: Brain: No midline shift, mass effect, or evidence of intracranial mass lesion. Cerebral volume is within normal limits for age. No ventriculomegaly. No acute intracranial hemorrhage identified. Widespread, patchy hypodensity in the bilateral cerebral white matter, deep gray nuclei. Progression since 2007. No acute cortically based infarct or cortical encephalomalacia identified. Vascular: Calcified atherosclerosis at the skull base. No suspicious intracranial vascular hyperdensity. Skull: Advanced chronic degeneration at the skull base-C1 and odontoid. Associated cervicomedullary junction stenosis (series 3, image 1). No acute osseous abnormality identified. Sinuses/Orbits: Visualized paranasal sinuses and mastoids are stable and well pneumatized. Other: No acute orbit or scalp soft tissue finding. IMPRESSION: 1. No acute intracranial abnormality. Evidence of small vessel disease with progression since 2007. 2. Very severe degeneration of the skull base, C1, and odontoid with cervicomedullary junction stenosis. Electronically Signed   By: Genevie Ann M.D.   On: 03/27/2020 21:23   DG  CHEST PORT 1 VIEW  Result Date: 04/01/2020 CLINICAL DATA:  Shortness of breath, altered mental status EXAM: PORTABLE CHEST 1 VIEW COMPARISON:  03/27/2020 FINDINGS: Stable cardiomediastinal contours. Atherosclerotic calcification of the aortic knob. Small left pleural effusion with associated left basilar opacity similar in appearance to prior. Right lung appears clear. No pneumothorax. Demineralized osseous structures with chronic arthropathy of the bilateral shoulders and numerous prior kyphoplasty/vertebroplasty changes of the visualized thoracic spine. IMPRESSION: Small left pleural effusion with associated left basilar opacity, similar in appearance to prior. Electronically Signed   By: Davina Poke D.O.   On: 04/01/2020 11:23   DG Chest Portable 1 View  Result Date: 03/27/2020 CLINICAL DATA:  Shortness of breath EXAM: PORTABLE CHEST 1 VIEW  COMPARISON:  Feb 20, 2019 FINDINGS: There is airspace opacity in the left base with small left pleural effusion. The lungs elsewhere are clear. The heart is upper normal in size with pulmonary vascularity normal. There is aortic atherosclerosis. No adenopathy appreciable. There have been previous kyphoplasty procedures in the upper lumbar region. There is arthropathy in each shoulder with superior migration of each humeral head. Bones are osteoporotic. IMPRESSION: Airspace consolidation consistent with pneumonia left base with small left pleural effusion. Lungs elsewhere clear. Stable cardiac prominence. Aortic Atherosclerosis (ICD10-I70.0). Bones osteoporotic. Probable chronic rotator cuff tears bilaterally, characterized by superior migration of each humeral head. Electronically Signed   By: Lowella Grip III M.D.   On: 03/27/2020 15:04   ECHOCARDIOGRAM COMPLETE  Result Date: 04/02/2020    ECHOCARDIOGRAM REPORT   Patient Name:   DONNIA POPLASKI Date of Exam: 04/02/2020 Medical Rec #:  [id number]                Height:       54.0 in Accession #:     [id number]               Weight:       135.0 lb Date of Birth:  May 26, 1923                 BSA:          1.464 m Patient Age:    86 years                 BP:           120/50 mmHg Patient Gender: F                        HR:           86 bpm. Exam Location:  Inpatient Procedure: 2D Echo Indications:    CHF- Acute Diastolic F16.38  History:        Patient has prior history of Echocardiogram examinations, most                 recent 09/28/2018. Cardiomyopathy, Previous Myocardial                 Infarction, Aortic Valve Disease, Arrythmias:Atrial                 Fibrillation; Risk Factors:Hypertension.  Sonographer:    [name] RDCS (AE) Referring Phys: [place]  1. There is systolic left ventricular cavity obliteration with a mid chamber "gradient" up to 3 m/s. There is no evidence of systolic anterior motion of the mitral leaflet or true subvalvular dynamic obstruction and there are minimal degenerative valve leaflet changes. . Left ventricular ejection fraction, by estimation, is >75%. The left ventricle has hyperdynamic function. The left ventricle has no regional wall motion abnormalities. Left ventricular diastolic parameters are consistent with Grade I diastolic dysfunction (impaired relaxation).  2. Right ventricular systolic function is normal. The right ventricular size is normal. There is normal pulmonary artery systolic pressure. The estimated right ventricular systolic pressure is 46.6 mmHg.  3. The mitral valve is normal in structure. Mild mitral valve regurgitation.  4. Mildly increased aortic valve gradients due to hyperdynamic left ventricular function. The aortic valve is normal in structure. Aortic valve regurgitation is not visualized. No aortic stenosis is present.  5. The inferior vena cava is normal in size with greater than 50% respiratory variability, suggesting right atrial pressure of 3  mmHg. Comparison(s): No significant change from prior study. Prior  images reviewed side by side. FINDINGS  Left Ventricle: There is systolic left ventricular cavity obliteration with a mid chamber "gradient" up to 3 m/s. There is no evidence of systolic anterior motion of the mitral leaflet or true subvalvular dynamic obstruction and there are minimal degenerative valve leaflet changes. Left ventricular ejection fraction, by estimation, is >75%. The left ventricle has hyperdynamic function. The left ventricle has no regional wall motion abnormalities. The left ventricular internal cavity size was small. There is no left ventricular hypertrophy. Left ventricular diastolic parameters are consistent with Grade I diastolic dysfunction (impaired relaxation). Indeterminate filling pressures. Right Ventricle: The right ventricular size is normal. No increase in right ventricular wall thickness. Right ventricular systolic function is normal. There is normal pulmonary artery systolic pressure. The tricuspid regurgitant velocity is 2.74 m/s, and  with an assumed right atrial pressure of 3 mmHg, the estimated right ventricular systolic pressure is 88.8 mmHg. Left Atrium: Left atrial size was normal in size. Right Atrium: Right atrial size was normal in size. Pericardium: There is no evidence of pericardial effusion. Mitral Valve: The mitral valve is normal in structure. There is mild late systolic prolapse of both leaflets of the mitral valve. Mild mitral valve regurgitation. Tricuspid Valve: The tricuspid valve is normal in structure. Tricuspid valve regurgitation is trivial. Aortic Valve: Mildly increased aortic valve gradients due to hyperdynamic left ventricular function. The aortic valve is normal in structure. Aortic valve regurgitation is not visualized. No aortic stenosis is present. Aortic valve mean gradient measures  8.0 mmHg. Aortic valve peak gradient measures 20.1 mmHg. Aortic valve area, by VTI measures 1.69 cm. Pulmonic Valve: The pulmonic valve was grossly normal. Pulmonic  valve regurgitation is trivial. Aorta: The aortic root was not well visualized. Venous: The inferior vena cava is normal in size with greater than 50% respiratory variability, suggesting right atrial pressure of 3 mmHg. IAS/Shunts: No atrial level shunt detected by color flow Doppler.  LEFT VENTRICLE PLAX 2D LVIDd:         2.80 cm  Diastology LVIDs:         2.00 cm  LV e' lateral:   5.87 cm/s LV PW:         1.00 cm  LV E/e' lateral: 12.1 LV IVS:        1.10 cm  LV e' medial:    3.92 cm/s LVOT diam:     2.00 cm  LV E/e' medial:  18.1 LV SV:         63 LV SV Index:   43 LVOT Area:     3.14 cm  RIGHT VENTRICLE TAPSE (M-mode): 1.3 cm LEFT ATRIUM           Index       RIGHT ATRIUM           Index LA diam:      1.80 cm 1.23 cm/m  RA Area:     11.00 cm LA Vol (A2C): 11.1 ml 7.58 ml/m  RA Volume:   20.80 ml  14.21 ml/m LA Vol (A4C): 40.5 ml 27.67 ml/m  AORTIC VALVE AV Area (Vmax):    1.50 cm AV Area (Vmean):   1.58 cm AV Area (VTI):     1.69 cm AV Vmax:           224.00 cm/s AV Vmean:          131.000 cm/s AV VTI:  0.371 m AV Peak Grad:      20.1 mmHg AV Mean Grad:      8.0 mmHg LVOT Vmax:         107.00 cm/s LVOT Vmean:        65.700 cm/s LVOT VTI:          0.199 m LVOT/AV VTI ratio: 0.54  AORTA Ao Root diam: 2.40 cm MITRAL VALVE                TRICUSPID VALVE MV Area (PHT): 3.54 cm     TR Peak grad:   30.0 mmHg MV Decel Time: 214 msec     TR Vmax:        274.00 cm/s MV E velocity: 71.10 cm/s MV A velocity: 174.00 cm/s  SHUNTS MV E/A ratio:  0.41         Systemic VTI:  0.20 m                             Systemic Diam: 2.00 cm Dani Gobble Croitoru MD Electronically signed by Sanda Klein MD Signature Date/Time: 04/02/2020/2:19:17 PM    Final    DG Hip Port Unilat W or Wo Pelvis 1 View Right  Result Date: 03/27/2020 CLINICAL DATA:  Recent hip surgery, multiple falls EXAM: DG HIP (WITH OR WITHOUT PELVIS) 1V PORT RIGHT COMPARISON:  12/21/2018 FINDINGS: Frontal view of the pelvis as well as frontal view of the  right hip are obtained. Since the prior exam, intramedullary rod with proximal dynamic and distal interlocking screw has been placed across the previously seen intertrochanteric right hip fracture. There has been bony resorption of the femoral head, with migration of the dynamic screw now resulting in acetabular roof erosion. Residual fracture line can be seen along the remaining portion of the femoral neck consistent with nonunion. No acute bony abnormality.  Stable left hip osteoarthritis. IMPRESSION: 1. 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 Bony resorption of the majority of the right femoral head, with bony erosion of the acetabular roof by the dynamic screw. 2. Stable left hip osteoarthritis. Electronically Signed   By: Randa Ngo M.D.   On: 03/27/2020 15:29      Subjective: No acute issues or events overnight denies headache, fever, chills, nausea, vomiting, diarrhea, constipation.   Discharge Exam: Vitals:   04/03/20 0356 04/03/20 0828  BP: (!) 130/54   Pulse: 71 72  Resp: 16 18  Temp: 98.5 F (36.9 C)   SpO2: 98% 92%   Vitals:   04/02/20 2028 04/03/20 0356 04/03/20 0524 04/03/20 0828  BP: (!) 139/49 (!) 130/54    Pulse: 79 71  72  Resp: 16 16  18   Temp: 98.2 F (36.8 C) 98.5 F (36.9 C)    TempSrc: Axillary Oral    SpO2: 95% 98%  92%  Weight:   55.8 kg   Height:        General: Pt is alert, awake, not in acute distress Cardiovascular: RRR, S1/S2 +, no rubs, no gallops Respiratory: CTA bilaterally, no wheezing, no rhonchi Abdominal: Soft, NT, ND, bowel sounds + Extremities: no edema, no cyanosis    The results of significant diagnostics from this hospitalization (including imaging, microbiology, ancillary and laboratory) are listed below for reference.     Microbiology: Recent Results (from the past 240 hour(s))  SARS Coronavirus 2 by RT PCR (hospital order, performed in [facility] hospital lab) Nasopharyngeal  Nasopharyngeal  Swab     Status: None   Collection Time: 03/27/20  2:27 PM   Specimen: Nasopharyngeal Swab  Result Value Ref Range Status   SARS Coronavirus 2 NEGATIVE NEGATIVE Final    Comment: (NOTE) SARS-CoV-2 target nucleic acids are NOT DETECTED. The SARS-CoV-2 RNA is generally detectable in upper and lower respiratory specimens during the acute phase of infection. The lowest concentration of SARS-CoV-2 viral copies this assay can detect is 250 copies / mL. A negative result does not preclude SARS-CoV-2 infection and should not be used as the sole basis for treatment or other patient management decisions.  A negative result may occur with improper specimen collection / handling, submission of specimen other than nasopharyngeal swab, presence of viral mutation(s) within the areas targeted by this assay, and inadequate number of viral copies (<250 copies / mL). A negative result must be combined with clinical observations, patient history, and epidemiological information. Fact Sheet for Patients:   StrictlyIdeas.no Fact Sheet for Healthcare Providers: BankingDealers.co.za This test is not yet approved or cleared  by the [place] FDA and has been authorized for detection and/or diagnosis of SARS-CoV-2 by FDA under an Emergency Use Authorization (EUA).  This EUA will remain in effect (meaning this test can be used) for the duration of the COVID-19 declaration under Section 564(b)(1) of the Act, 21 U.S.C. section 360bbb-3(b)(1), unless the authorization is terminated or revoked sooner. Performed at [facility], [place] [address] [place], [place] [address]   Urine culture     Status: Abnormal   Collection Time: 03/27/20  5:06 PM   Specimen: Urine, Random  Result Value Ref Range Status   Specimen Description URINE, RANDOM  Final   Special Requests   Final    NONE Performed at [facility], [place] [address]  [place], [place] [address]    Culture MULTIPLE SPECIES PRESENT, SUGGEST RECOLLECTION (A)  Final   Report Status 03/28/2020 FINAL  Final  Culture, blood (routine x 2)     Status: None   Collection Time: 03/27/20  7:30 PM   Specimen: BLOOD RIGHT HAND  Result Value Ref Range Status   Specimen Description BLOOD RIGHT HAND  Final   Special Requests   Final    BOTTLES DRAWN AEROBIC AND ANAEROBIC Blood Culture adequate volume   Culture   Final    NO GROWTH 5 DAYS Performed at [facility], [place] [address], [place], [place] [address]    Report Status 04/01/2020 FINAL  Final  Culture, blood (routine x 2)     Status: None   Collection Time: 03/27/20  7:30 PM   Specimen: BLOOD  Result Value Ref Range Status   Specimen Description BLOOD RIGHT ANTECUBITAL  Final   Special Requests   Final    BOTTLES DRAWN AEROBIC AND ANAEROBIC Blood Culture adequate volume   Culture   Final    NO GROWTH 5 DAYS Performed at [facility], [place] [address], [place], [place] [address]    Report Status 04/01/2020 FINAL  Final     Labs: BNP (last 3 results) Recent Labs    05/02/19 0000 03/27/20 1436 04/01/20 1305  BNP 95 244.9* 789.3*   Basic Metabolic Panel: Recent Labs  Lab 03/28/20 0140 03/29/20 1506 03/31/20 0256 03/31/20 1451 04/01/20 0425 04/02/20 0441 04/03/20 0240  NA 145   < > 148* 145 147* 145 142  K 4.0   < > 3.5 3.6 3.9 3.5 3.3*  CL 107   < >  108 109 107 107 104  CO2 30   < > 31 28 28 27 28   GLUCOSE 54*   < > 96 102* 106* 84 85  BUN 68*   < > 25* 23 21 26* 38*  CREATININE 1.73*   < > 0.99 0.84 0.82 0.99 1.24*  CALCIUM 8.3*   < > 8.4* 8.0* 8.3* 8.2* 7.9*  MG 2.2  --   --   --   --   --   --   PHOS 4.2  --   --   --   --   --   --    < > = values in this interval not displayed.   Liver Function Tests: No results for input(s): AST, ALT, ALKPHOS, BILITOT, PROT, ALBUMIN in the last 168 hours. No results for input(s): LIPASE, AMYLASE in the last 168 hours. No results for  input(s): AMMONIA in the last 168 hours. CBC: Recent Labs  Lab 03/27/20 1436 03/28/20 0140 03/30/20 0259 03/31/20 0256 04/01/20 0425 04/02/20 0441 04/03/20 0240  WBC 15.3*   < > 10.9* 11.1* 14.3* 20.3* 21.0*  NEUTROABS 13.9*  --   --   --   --  16.8* 17.8*  HGB 11.9*   < > 12.3 12.9 12.9 12.7 11.2*  HCT 36.8   < > 38.5 40.4 40.2 39.3 34.0*  MCV 96.3   < > 97.0 96.9 97.3 94.9 93.9  PLT 188   < > 148* 136* 143* 156 171   < > = values in this interval not displayed.   Cardiac Enzymes: No results for input(s): CKTOTAL, CKMB, CKMBINDEX, TROPONINI in the last 168 hours. BNP: Invalid input(s): POCBNP CBG: Recent Labs  Lab 04/02/20 2025 04/03/20 0016 04/03/20 0359 04/03/20 0745 04/03/20 1150  GLUCAP 75 90 82 114* 123*   D-Dimer No results for input(s): DDIMER in the last 72 hours. Hgb A1c No results for input(s): HGBA1C in the last 72 hours. Lipid Profile No results for input(s): CHOL, HDL, LDLCALC, TRIG, CHOLHDL, LDLDIRECT in the last 72 hours. Thyroid function studies No results for input(s): TSH, T4TOTAL, T3FREE, THYROIDAB in the last 72 hours.  Invalid input(s): FREET3 Anemia work up No results for input(s): VITAMINB12, FOLATE, FERRITIN, TIBC, IRON, RETICCTPCT in the last 72 hours. Urinalysis    Component Value Date/Time   COLORURINE YELLOW 03/27/2020 1706   APPEARANCEUR HAZY (A) 03/27/2020 1706   LABSPEC 1.014 03/27/2020 1706   PHURINE 5.0 03/27/2020 1706   GLUCOSEU NEGATIVE 03/27/2020 1706   HGBUR SMALL (A) 03/27/2020 1706   BILIRUBINUR NEGATIVE 03/27/2020 1706   KETONESUR NEGATIVE 03/27/2020 1706   PROTEINUR NEGATIVE 03/27/2020 1706   UROBILINOGEN 1.0 07/20/2014 1209   NITRITE NEGATIVE 03/27/2020 1706   LEUKOCYTESUR LARGE (A) 03/27/2020 1706   Sepsis Labs Invalid input(s): PROCALCITONIN,  WBC,  LACTICIDVEN Microbiology Recent Results (from the past 240 hour(s))  SARS Coronavirus 2 by RT PCR (hospital order, performed in [facility] hospital lab)  Nasopharyngeal Nasopharyngeal Swab     Status: None   Collection Time: 03/27/20  2:27 PM   Specimen: Nasopharyngeal Swab  Result Value Ref Range Status   SARS Coronavirus 2 NEGATIVE NEGATIVE Final    Comment: (NOTE) SARS-CoV-2 target nucleic acids are NOT DETECTED. The SARS-CoV-2 RNA is generally detectable in upper and lower respiratory specimens during the acute phase of infection. The lowest concentration of SARS-CoV-2 viral copies this assay can detect is 250 copies / mL. A negative result does not preclude SARS-CoV-2 infection and should not  be used as the sole basis for treatment or other patient management decisions.  A negative result may occur with improper specimen collection / handling, submission of specimen other than nasopharyngeal swab, presence of viral mutation(s) within the areas targeted by this assay, and inadequate number of viral copies (<250 copies / mL). A negative result must be combined with clinical observations, patient history, and epidemiological information. Fact Sheet for Patients:   StrictlyIdeas.no Fact Sheet for Healthcare Providers: BankingDealers.co.za This test is not yet approved or cleared  by the [place] FDA and has been authorized for detection and/or diagnosis of SARS-CoV-2 by FDA under an Emergency Use Authorization (EUA).  This EUA will remain in effect (meaning this test can be used) for the duration of the COVID-19 declaration under Section 564(b)(1) of the Act, 21 U.S.C. section 360bbb-3(b)(1), unless the authorization is terminated or revoked sooner. Performed at [facility], [place] [address] [place], [place] [address]   Urine culture     Status: Abnormal   Collection Time: 03/27/20  5:06 PM   Specimen: Urine, Random  Result Value Ref Range Status   Specimen Description URINE, RANDOM  Final   Special Requests   Final    NONE Performed at [facility], [place]  [address] [place], [place] [address]    Culture MULTIPLE SPECIES PRESENT, SUGGEST RECOLLECTION (A)  Final   Report Status 03/28/2020 FINAL  Final  Culture, blood (routine x 2)     Status: None   Collection Time: 03/27/20  7:30 PM   Specimen: BLOOD RIGHT HAND  Result Value Ref Range Status   Specimen Description BLOOD RIGHT HAND  Final   Special Requests   Final    BOTTLES DRAWN AEROBIC AND ANAEROBIC Blood Culture adequate volume   Culture   Final    NO GROWTH 5 DAYS Performed at [facility], [place] [address] [place], [place] [address]    Report Status 04/01/2020 FINAL  Final  Culture, blood (routine x 2)     Status: None   Collection Time: 03/27/20  7:30 PM   Specimen: BLOOD  Result Value Ref Range Status   Specimen Description BLOOD RIGHT ANTECUBITAL  Final   Special Requests   Final    BOTTLES DRAWN AEROBIC AND ANAEROBIC Blood Culture adequate volume   Culture   Final    NO GROWTH 5 DAYS Performed at [facility], [place] [address], [place], [place] [address]    Report Status 04/01/2020 FINAL  Final     Time coordinating discharge: Over 30 minutes  SIGNED:   Little Ishikawa, DO [facility] 04/03/2020, 12:07 PM

## 2020-04-04 ENCOUNTER — Non-Acute Institutional Stay (SKILLED_NURSING_FACILITY): Payer: Medicare PPO | Admitting: Internal Medicine

## 2020-04-04 ENCOUNTER — Encounter: Payer: Self-pay | Admitting: Internal Medicine

## 2020-04-04 DIAGNOSIS — N189 Chronic kidney disease, unspecified: Secondary | ICD-10-CM | POA: Diagnosis not present

## 2020-04-04 DIAGNOSIS — I10 Essential (primary) hypertension: Secondary | ICD-10-CM | POA: Diagnosis not present

## 2020-04-04 DIAGNOSIS — E222 Syndrome of inappropriate secretion of antidiuretic hormone: Secondary | ICD-10-CM | POA: Diagnosis not present

## 2020-04-04 DIAGNOSIS — N179 Acute kidney failure, unspecified: Secondary | ICD-10-CM

## 2020-04-04 DIAGNOSIS — D649 Anemia, unspecified: Secondary | ICD-10-CM | POA: Diagnosis not present

## 2020-04-04 DIAGNOSIS — J189 Pneumonia, unspecified organism: Secondary | ICD-10-CM | POA: Diagnosis not present

## 2020-04-04 DIAGNOSIS — I4819 Other persistent atrial fibrillation: Secondary | ICD-10-CM | POA: Diagnosis not present

## 2020-04-04 NOTE — Progress Notes (Signed)
Provider:   Location:  [place] Room Number: 69 Place of Service:  SNF (31)  PCP: [name], MD Patient Care Team: [name], MD as PCP - General (Internal Medicine) [name], MD as PCP - Cardiology (Cardiology) [name], MD as Consulting Physician (Internal Medicine) Ngetich, Nelda Bucks, NP as Nurse Practitioner (Family Medicine)  Extended Emergency Contact Information Primary Emergency Contact: Centrella,Bob Address: [address]           [place], [place] Phone: [phone] Relation: Son Secondary Emergency Contact: Philmore [place] Phone: [phone] Mobile Phone: [phone] Relation: Daughter  Code Status: DNR Goals of Care: Advanced Directive information Advanced Directives 04/04/2020  Does Patient Have a Medical Advance Directive? Yes  Type of Paramedic of [place];Out of facility DNR (pink MOST or yellow form);Living will  Does patient want to make changes to medical advance directive? No - Patient declined  Copy of [place] in Chart? Yes - validated most recent copy scanned in chart (See row information)  Would patient like information on creating a medical advance directive? -  Pre-existing out of facility DNR order (yellow form or pink MOST form) -      Chief Complaint  Patient presents with  . Admission    Re-Admission    HPI: Patient is a 86 y.o. female seen today for Readmission to SNF for Therapy and Long term Care Patient was admitted in the hospital from 6/9-6/16 for left lower lobe pneumonia   She has a history of Takotsubo's cardiomyopathy with EF of 55 to 18%, diastolic dysfunction, hypertension, PAF not on any anticoagulation, SIADH, depressionand S/P Right Hip Fractureunderwent InterMedullary Implant on 03/04. And recentdisplaced fracture of distal right ulnarDue to Fall  Patient was  evaluated initially in the facility for lethargy and confusion.  Her x-ray showed bilateral infiltrate.  Her labs came back with acute on chronic renal insufficiency.  With BUN of 92 and creatinine of 3.  She was started on IV fluids but patient continued to be lethargic and it was decided to send her back to the hospital. Was treated with antibiotics. Also seen by palliative. Today patient is back to her baseline.  Does not remember much from the hospital except that she had pneumonia.  Patient does have limited bruises on her hands and legs. She denies any cough or shortness of breath or chest pain or fever P.o. intake seems sufficient. No Nausea or vomiting   Past Medical History:  Diagnosis Date  . Amputation of finger of right hand   . Aortic stenosis, mild 03/03/2016   By echo 08/2015  . Arthritis   . Back pain   . Bradycardia 08/28/2015  . CKD (chronic kidney disease) stage 3, GFR 30-59 ml/min   . Closed right hip fracture ([place]) 12/2018  . Depression   . GERD (gastroesophageal reflux disease)   . GI bleed due to NSAIDs   . Hyperlipidemia   . Hypertension   . Macular degeneration   . Maxillary sinusitis   . MVP (mitral valve prolapse) 03/03/2016   Bileaflet MVP with mild MR by echo 2016  . Old MI (myocardial infarction)    NSTEMI secondary to stress MI/Takotsubo CM, minimal nonobstructive ASCAD at cath  . Osteoporosis   . Persistent atrial fibrillation ([place]) 2006   not on anticoagulation due to GI bleed and increased fall risk  . PMR (polymyalgia rheumatica) (  [place])   . SIADH (syndrome of inappropriate ADH production) ([place])   . Takotsubo cardiomyopathy    EF normalized to 70%  . Ventricular tachycardia ([place])    on initial presentation of MI   Past Surgical History:  Procedure Laterality Date  . CARDIAC CATHETERIZATION     normal coronary arteries  . CHOLECYSTECTOMY  11/10/2011   Procedure: LAPAROSCOPIC CHOLECYSTECTOMY WITH INTRAOPERATIVE CHOLANGIOGRAM;  Surgeon: Pedro Earls, MD;  Location: [facility];  Service: General;  Laterality: N/A;  . EYE SURGERY  06/10/11   membrane peel   . INTRAMEDULLARY (IM) NAIL INTERTROCHANTERIC Right 12/21/2018   Procedure: INTRAMEDULLARY (IM) NAIL INTERTROCHANTRIC;  Surgeon: Nicholes Stairs, MD;  Location: [facility];  Service: Orthopedics;  Laterality: Right;  . NASAL SINUS SURGERY Left   . ROTATOR CUFF REPAIR  1998  . UMBILICAL HERNIA REPAIR  11/10/2011   Procedure: HERNIA REPAIR UMBILICAL ADULT;  Surgeon: Pedro Earls, MD;  Location: [facility];  Service: General;  Laterality: N/A;  . WISDOM TOOTH EXTRACTION      reports that she has never smoked. She has never used smokeless tobacco. She reports that she does not drink alcohol and does not use drugs. Social History   Socioeconomic History  . Marital status: Married    Spouse name: Not on file  . Number of children: Not on file  . Years of education: Not on file  . Highest education level: Not on file  Occupational History  . Not on file  Tobacco Use  . Smoking status: Never Smoker  . Smokeless tobacco: Never Used  Vaping Use  . Vaping Use: Never used  Substance and Sexual Activity  . Alcohol use: No    Alcohol/week: 1.0 standard drink    Types: 1 Glasses of wine per week    Comment: occassionally  . Drug use: No  . Sexual activity: Not on file  Other Topics Concern  . Not on file  Social History Narrative  . Not on file   Social Determinants of Health   Financial Resource Strain:   . Difficulty of Paying Living Expenses:   Food Insecurity:   . Worried About Charity fundraiser in the Last Year:   . Arboriculturist in the Last Year:   Transportation Needs:   . Film/video editor (Medical):   Marland Kitchen Lack of Transportation (Non-Medical):   Physical Activity:   . Days of Exercise per Week:   . Minutes of Exercise per Session:   Stress:   . Feeling of Stress :   Social Connections:   . Frequency of Communication with Friends and Family:   . Frequency  of Social Gatherings with Friends and Family:   . Attends Religious Services:   . Active Member of Clubs or Organizations:   . Attends Archivist Meetings:   Marland Kitchen Marital Status:   Intimate Partner Violence:   . Fear of Current or Ex-Partner:   . Emotionally Abused:   Marland Kitchen Physically Abused:   . Sexually Abused:     Functional Status Survey:    Family History  Problem Relation Age of Onset  . Cancer Mother     Health Maintenance  Topic Date Due  . TETANUS/TDAP  Never done  . DEXA SCAN  Never done  . INFLUENZA VACCINE  05/19/2020  . COVID-19 Vaccine  Completed  . PNA vac Low Risk Adult  Completed    Allergies  Allergen Reactions  . Amlodipine Swelling  To feet and ankles   . Avelox [Moxifloxacin Hcl In Nacl] Other (See Comments)    Pt does not remember   . Biaxin [Clarithromycin]   . Ceftin [Cefuroxime Axetil]   . Duragesic Disc Transdermal System [Fentanyl] Other (See Comments)    Reaction unknown  . Forteo [Parathyroid Hormone (Recomb)] Other (See Comments)    Made skin dry  . Morphine And Related Other (See Comments)    "My Sister and daughter can,t take it. I've had it before without problems."  . Teriparatide   . Lidocaine Rash    Outpatient Encounter Medications as of 04/04/2020  Medication Sig  . acetaminophen (TYLENOL) 325 MG tablet Take 650 mg by mouth 2 (two) times daily.   Marland Kitchen acetaminophen (TYLENOL) 500 MG tablet Take 500 mg by mouth every 6 (six) hours as needed for moderate pain. Every 6hrs.  . albuterol (PROVENTIL) (2.5 MG/3ML) 0.083% nebulizer solution Take 2.5 mg by nebulization as directed. Take 1 neb inhalation every 6 hours and Take 1 neb inhalation every 6 hours PRN FOR SOB AND WHEEZING  . Amino Acids-Protein Hydrolys (FEEDING SUPPLEMENT, PRO-STAT SUGAR FREE 64,) LIQD Take 30 mLs by mouth daily.  Marland Kitchen azelastine (ASTELIN) 0.1 % nasal spray Place 2 sprays into both nostrils daily.   . budesonide-formoterol (SYMBICORT) 80-4.5 MCG/ACT inhaler  Inhale 2 puffs into the lungs 2 (two) times daily.   Marland Kitchen buPROPion (WELLBUTRIN SR) 150 MG 12 hr tablet Take 1 tablet (150 mg total) by mouth every morning.  . carboxymethylcellul-glycerin (LUBRICATING EYE DROPS) 0.5-0.9 % ophthalmic solution Place 1 drop into both eyes 3 (three) times daily as needed for dry eyes. For allergies  . cephALEXin (KEFLEX) 250 MG capsule Take 1 capsule (250 mg total) by mouth 4 (four) times daily for 3 days.  . cetirizine (ZYRTEC) 10 MG tablet Take 10 mg by mouth daily.   Marland Kitchen demeclocycline (DECLOMYCIN) 150 MG tablet Take 150 mg by mouth 2 (two) times daily.   . diclofenac sodium (VOLTAREN) 1 % GEL Apply 1 application topically 4 (four) times daily as needed (pain).   . famotidine (PEPCID) 20 MG tablet Take 20 mg by mouth daily.  . Ferrous Sulfate (IRON) 325 (65 Fe) MG TABS Take 1 tablet by mouth every other day. Give with meals  . fluticasone (FLONASE) 50 MCG/ACT nasal spray Place 2 sprays into both nostrils daily.   . memantine (NAMENDA) 5 MG tablet Take 5 mg by mouth 2 (two) times daily.   Marland Kitchen nystatin cream (MYCOSTATIN) Apply 1 application topically 2 (two) times daily.   . ondansetron (ZOFRAN-ODT) 4 MG disintegrating tablet Take 4 mg by mouth every 6 (six) hours as needed for nausea or vomiting.   . pantoprazole (PROTONIX) 40 MG tablet Take 40 mg by mouth daily.  . polyethylene glycol (MIRALAX / GLYCOLAX) packet Take 17 g by mouth daily as needed for mild constipation.   . pregabalin (LYRICA) 25 MG capsule Take 25 mg by mouth every evening.  . torsemide (DEMADEX) 20 MG tablet Take 20 mg by mouth daily.   Marland Kitchen triamcinolone cream (KENALOG) 0.5 % Apply 1 application topically 2 (two) times daily.  Marland Kitchen zinc oxide 20 % ointment Apply 1 application topically as needed for irritation. To buttocks after every incontinent episode and as needed for redness. May keep at bedside  . [DISCONTINUED] pregabalin (LYRICA) 25 MG capsule Take 1 capsule (25 mg total) by mouth 2 (two) times  daily.   No facility-administered encounter medications on file as of 04/04/2020.  Review of Systems  Constitutional: Positive for activity change and appetite change.  HENT: Negative.   Respiratory: Positive for cough.   Cardiovascular: Positive for leg swelling.  Gastrointestinal: Negative.   Genitourinary: Negative.   Musculoskeletal: Positive for gait problem.  Neurological: Positive for weakness.  Psychiatric/Behavioral: Positive for confusion and dysphoric mood.    Vitals:   04/04/20 1010  BP: 120/72  Pulse: 66  Resp: 18  Temp: 97.9 F (36.6 C)  SpO2: 94%  Weight: 132 lb 6.4 oz (60.1 kg)  Height: 4\' 6"  (1.372 m)   Body mass index is 31.92 kg/m. Physical Exam Vitals reviewed.  Constitutional:      Appearance: Normal appearance.  HENT:     Head: Normocephalic.     Nose: Nose normal.     Mouth/Throat:     Mouth: Mucous membranes are moist.     Pharynx: Oropharynx is clear.  Eyes:     Pupils: Pupils are equal, round, and reactive to light.  Cardiovascular:     Rate and Rhythm: Normal rate. Rhythm irregular.     Pulses: Normal pulses.     Heart sounds: Murmur heard.   Pulmonary:     Effort: Pulmonary effort is normal.     Comments: Has Rales in  Left Lower Lobe Abdominal:     General: Abdomen is flat. Bowel sounds are normal.     Palpations: Abdomen is soft.  Musculoskeletal:        General: Swelling present.     Cervical back: Neck supple.  Skin:    Comments: Has bruises in both Legs Coccyx Wound Stage 2  Pressure wound 5 x2.8   Has 2 DTI in Right heel  Stage 1 in Left Heel  Unstageable on Left 2nd toe  Neurological:     General: No focal deficit present.     Mental Status: She is alert.  Psychiatric:        Mood and Affect: Mood normal.        Thought Content: Thought content normal.     Labs reviewed: Basic Metabolic Panel: Recent Labs    03/28/20 0140 03/29/20 1506 04/01/20 0425 04/02/20 0441 04/03/20 0240  NA 145   < > 147*  145 142  K 4.0   < > 3.9 3.5 3.3*  CL 107   < > 107 107 104  CO2 30   < > 28 27 28   GLUCOSE 54*   < > 106* 84 85  BUN 68*   < > 21 26* 38*  CREATININE 1.73*   < > 0.82 0.99 1.24*  CALCIUM 8.3*   < > 8.3* 8.2* 7.9*  MG 2.2  --   --   --   --   PHOS 4.2  --   --   --   --    < > = values in this interval not displayed.   Liver Function Tests: Recent Labs    11/23/19 0000 12/26/19 0000 03/27/20 0000  AST 15 16 32  ALT 13 15 34  ALKPHOS 82 108 201*  ALBUMIN 3.0* 3.0* 2.9*   No results for input(s): LIPASE, AMYLASE in the last 8760 hours. No results for input(s): AMMONIA in the last 8760 hours. CBC: Recent Labs    03/27/20 1436 03/28/20 0140 04/01/20 0425 04/02/20 0441 04/03/20 0240  WBC 15.3*   < > 14.3* 20.3* 21.0*  NEUTROABS 13.9*  --   --  16.8* 17.8*  HGB 11.9*   < > 12.9 12.7 11.2*  HCT 36.8   < > 40.2 39.3 34.0*  MCV 96.3   < > 97.3 94.9 93.9  PLT 188   < > 143* 156 171   < > = values in this interval not displayed.   Cardiac Enzymes: No results for input(s): CKTOTAL, CKMB, CKMBINDEX, TROPONINI in the last 8760 hours. BNP: Invalid input(s): POCBNP No results found for: HGBA1C Lab Results  Component Value Date   TSH 2.600 09/19/2018   No results found for: VITAMINB12 No results found for: FOLATE Lab Results  Component Value Date   IRON 26 (L) 04/28/2007   TIBC 178 (L) 04/28/2007   FERRITIN 208 04/28/2007    Imaging and Procedures obtained prior to SNF admission: CT HEAD WO CONTRAST  Result Date: 03/27/2020 CLINICAL DATA:  86 year old female with encephalopathy. EXAM: CT HEAD WITHOUT CONTRAST TECHNIQUE: Contiguous axial images were obtained from the base of the skull through the vertex without intravenous contrast. COMPARISON:  Head CT 11/14/2005. FINDINGS: Brain: No midline shift, mass effect, or evidence of intracranial mass lesion. Cerebral volume is within normal limits for age. No ventriculomegaly. No acute intracranial hemorrhage identified.  Widespread, patchy hypodensity in the bilateral cerebral white matter, deep gray nuclei. Progression since 2007. No acute cortically based infarct or cortical encephalomalacia identified. Vascular: Calcified atherosclerosis at the skull base. No suspicious intracranial vascular hyperdensity. Skull: Advanced chronic degeneration at the skull base-C1 and odontoid. Associated cervicomedullary junction stenosis (series 3, image 1). No acute osseous abnormality identified. Sinuses/Orbits: Visualized paranasal sinuses and mastoids are stable and well pneumatized. Other: No acute orbit or scalp soft tissue finding. IMPRESSION: 1. No acute intracranial abnormality. Evidence of small vessel disease with progression since 2007. 2. Very severe degeneration of the skull base, C1, and odontoid with cervicomedullary junction stenosis. Electronically Signed   By: Genevie Ann M.D.   On: 03/27/2020 21:23   DG Chest Portable 1 View  Result Date: 03/27/2020 CLINICAL DATA:  Shortness of breath EXAM: PORTABLE CHEST 1 VIEW COMPARISON:  Feb 20, 2019 FINDINGS: There is airspace opacity in the left base with small left pleural effusion. The lungs elsewhere are clear. The heart is upper normal in size with pulmonary vascularity normal. There is aortic atherosclerosis. No adenopathy appreciable. There have been previous kyphoplasty procedures in the upper lumbar region. There is arthropathy in each shoulder with superior migration of each humeral head. Bones are osteoporotic. IMPRESSION: Airspace consolidation consistent with pneumonia left base with small left pleural effusion. Lungs elsewhere clear. Stable cardiac prominence. Aortic Atherosclerosis (ICD10-I70.0). Bones osteoporotic. Probable chronic rotator cuff tears bilaterally, characterized by superior migration of each humeral head. Electronically Signed   By: Lowella Grip III M.D.   On: 03/27/2020 15:04   DG Hip Port Unilat W or Wo Pelvis 1 View Right  Result Date:  03/27/2020 CLINICAL DATA:  Recent hip surgery, multiple falls EXAM: DG HIP (WITH OR WITHOUT PELVIS) 1V PORT RIGHT COMPARISON:  12/21/2018 FINDINGS: Frontal view of the pelvis as well as frontal view of the right hip are obtained. Since the prior exam, intramedullary rod with proximal dynamic and distal interlocking screw has been placed across the previously seen intertrochanteric right hip fracture. There has been bony resorption of the femoral head, with migration of the dynamic screw now resulting in acetabular roof erosion. Residual fracture line can be seen along the remaining portion of the femoral neck consistent with nonunion. No acute bony abnormality.  Stable left hip osteoarthritis. IMPRESSION: 1. 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 Bony resorption of the majority of the right femoral head, with bony erosion of the acetabular roof by the dynamic screw. 2. Stable left hip osteoarthritis. Electronically Signed   By: Randa Ngo M.D.   On: 03/27/2020 15:29    Assessment/Plan HCAP (healthcare-associated pneumonia) On Keflex for Cystitis and Pneumonia  Acute kidney injury superimposed on CKD ([place]) Creat Back to Normal LE edema Back on Toresimide Persistent atrial fibrillation ([facility]) Not on any Anticoagulation due to Falls and Bleeding Not on Coreg anymore  SIADH (syndrome of inappropriate ADH production) ([facility]) On Demeclocycline Sodium stable  Anemia, unspecified type On Iron COPD On Symbicort Allergic Rhinitis On Flonase and Astelin and Zyrtec Depression  stable on Wellbutrin Pain with neuropathy in Legs Continue Lyrica Multiple pressure Wounds Coccyx wound Silver with Foam dressing DTI Wounds  Skin prep and Elevating   Family/ staff Communication:   Labs/tests ordered: CBC,CMP in 1 week  Total time spent in this patient care encounter was  45_  minutes; greater than 50% of the visit spent counseling patient and staff,  reviewing records , Labs and coordinating care for problems addressed at this encounter.

## 2020-04-08 ENCOUNTER — Encounter: Payer: Self-pay | Admitting: Nurse Practitioner

## 2020-04-08 ENCOUNTER — Non-Acute Institutional Stay (SKILLED_NURSING_FACILITY): Payer: Medicare PPO | Admitting: Nurse Practitioner

## 2020-04-08 DIAGNOSIS — F0280 Dementia in other diseases classified elsewhere without behavioral disturbance: Secondary | ICD-10-CM

## 2020-04-08 DIAGNOSIS — M353 Polymyalgia rheumatica: Secondary | ICD-10-CM

## 2020-04-08 DIAGNOSIS — F0393 Unspecified dementia, unspecified severity, with mood disturbance: Secondary | ICD-10-CM

## 2020-04-08 DIAGNOSIS — K5901 Slow transit constipation: Secondary | ICD-10-CM

## 2020-04-08 DIAGNOSIS — E870 Hyperosmolality and hypernatremia: Secondary | ICD-10-CM

## 2020-04-08 DIAGNOSIS — D649 Anemia, unspecified: Secondary | ICD-10-CM | POA: Diagnosis not present

## 2020-04-08 DIAGNOSIS — R4189 Other symptoms and signs involving cognitive functions and awareness: Secondary | ICD-10-CM

## 2020-04-08 DIAGNOSIS — J42 Unspecified chronic bronchitis: Secondary | ICD-10-CM

## 2020-04-08 DIAGNOSIS — R630 Anorexia: Secondary | ICD-10-CM | POA: Insufficient documentation

## 2020-04-08 DIAGNOSIS — I4819 Other persistent atrial fibrillation: Secondary | ICD-10-CM

## 2020-04-08 DIAGNOSIS — I5189 Other ill-defined heart diseases: Secondary | ICD-10-CM

## 2020-04-08 DIAGNOSIS — F329 Major depressive disorder, single episode, unspecified: Secondary | ICD-10-CM

## 2020-04-08 DIAGNOSIS — K219 Gastro-esophageal reflux disease without esophagitis: Secondary | ICD-10-CM | POA: Diagnosis not present

## 2020-04-08 MED ORDER — PREGABALIN 25 MG PO CAPS: 25.0000 mg | ORAL_CAPSULE | Freq: Two times a day (BID) | ORAL | 0 refills | Status: DC

## 2020-04-08 NOTE — Assessment & Plan Note (Signed)
Continue Memantine for memory.

## 2020-04-08 NOTE — Assessment & Plan Note (Signed)
Stable, continue Famotidine, Pantoprazole, prn Zofran.

## 2020-04-08 NOTE — Progress Notes (Signed)
Location:   [facility] [place] Room Number: 34 Place of Service:  SNF (31) Provider: Lennie Odor Ishmael Berkovich NP  [name], MD  Patient Care Team: [name], MD as PCP - General (Internal Medicine) [name], MD as PCP - Cardiology (Cardiology) [name], MD as Consulting Physician (Internal Medicine) Ngetich, Nelda Bucks, NP as Nurse Practitioner (Family Medicine)  Extended Emergency Contact Information Primary Emergency Contact: [name],[name] Address: [address]           [place], [name] Phone: [phone] Relation: Son Secondary Emergency Contact: Philmore [place] Phone: [phone] Mobile Phone: [phone] Relation: Daughter  Code Status: DNR Goals of care: Advanced Directive information Advanced Directives 04/04/2020  Does Patient Have a Medical Advance Directive? Yes  Type of Paramedic of [place];Out of facility DNR (pink MOST or yellow form);Living will  Does patient want to make changes to medical advance directive? No - Patient declined  Copy of [place] in Chart? Yes - validated most recent copy scanned in chart (See row information)  Would patient like information on creating a medical advance directive? -  Pre-existing out of facility DNR order (yellow form or pink MOST form) -     Chief Complaint  Patient presents with  . Acute Visit    poor appetite    HPI:  Pt is a [name] seen today for an acute visit for reported the patient's poor appetite, the patient's son desires trial of appetite stimulant.   Hospitalized 6/9-6/16 for left lobe PNA, UTI, completed 3 more days  Keflex in [facility], pending CBC CMP in one wk since dc from hospital.   Hx of depression, taking Wellbutrin.   Hx of COPD, chronic DOE, cough, on prn Albuterol Neb, Symbicort bid, Cetirizine 39m qd, Fluticasone nasal spray qd.   GERD, stable, on  Famotidine 231mqd, Pantoprazole 4054md, prn Zofran.   Dementia, [facility] resident, on Memantine 5mg83md.   PMR, on Lyrica 63m 72mTylenol bid, pain is not well controlled, mostly in her back.   Hyponatremia, chronic Demeclocycline 150mg 46m Anemia, stable, on Fe  Constipation, stable, on prn MiraLax  CHF/edema BLE, taking Torsemide 20mg q79moist rales, swelling in legs, DOE.    Past Medical History:  Diagnosis Date  . Amputation of finger of right hand   . Aortic stenosis, mild 03/03/2016   By echo 08/2015  . Arthritis   . Back pain   . Bradycardia 08/28/2015  . CKD (chronic kidney disease) stage 3, GFR 30-59 ml/min   . Closed right hip fracture (HCC) [id number]  . Depression   . GERD (gastroesophageal reflux disease)   . GI bleed due to NSAIDs   . Hyperlipidemia   . Hypertension   . Macular degeneration   . Maxillary sinusitis   . MVP (mitral valve prolapse) 03/03/2016   Bileaflet MVP with mild MR by echo 2016  . Old MI (myocardial infarction)    NSTEMI secondary to stress MI/Takotsubo CM, minimal nonobstructive ASCAD at cath  . Osteoporosis   . Persistent atrial fibrillation (HCC) 20[place]  not on anticoagulation due to GI bleed and increased fall risk  . PMR (polymyalgia rheumatica) (HCC)   . SIADH (syndrome of inappropriate ADH production) (HCC)   Emery[place]otsubo cardiomyopathy    EF normalized to 70%  . Ventricular tachycardia (HCC)   [place] initial presentation of MI  Past Surgical History:  Procedure Laterality Date  . CARDIAC CATHETERIZATION     normal coronary arteries  . CHOLECYSTECTOMY  11/10/2011   Procedure: LAPAROSCOPIC CHOLECYSTECTOMY WITH INTRAOPERATIVE CHOLANGIOGRAM;  Surgeon: Pedro Earls, MD;  Location: [facility];  Service: General;  Laterality: N/A;  . EYE SURGERY  06/10/11   membrane peel   . INTRAMEDULLARY (IM) NAIL INTERTROCHANTERIC Right 12/21/2018   Procedure: INTRAMEDULLARY (IM) NAIL INTERTROCHANTRIC;  Surgeon: Nicholes Stairs, MD;  Location: [facility];   Service: Orthopedics;  Laterality: Right;  . NASAL SINUS SURGERY Left   . ROTATOR CUFF REPAIR  1998  . UMBILICAL HERNIA REPAIR  11/10/2011   Procedure: HERNIA REPAIR UMBILICAL ADULT;  Surgeon: Pedro Earls, MD;  Location: [facility];  Service: General;  Laterality: N/A;  . WISDOM TOOTH EXTRACTION      Allergies  Allergen Reactions  . Amlodipine Swelling    To feet and ankles   . Avelox [Moxifloxacin Hcl In Nacl] Other (See Comments)    Pt does not remember   . Biaxin [Clarithromycin]   . Ceftin [Cefuroxime Axetil]   . Duragesic Disc Transdermal System [Fentanyl] Other (See Comments)    Reaction unknown  . Forteo [Parathyroid Hormone (Recomb)] Other (See Comments)    Made skin dry  . Morphine And Related Other (See Comments)    "My Sister and daughter can,t take it. I've had it before without problems."  . Teriparatide   . Lidocaine Rash    Allergies as of 04/08/2020      Reactions   Amlodipine Swelling   To feet and ankles    Avelox [moxifloxacin Hcl In Nacl] Other (See Comments)   Pt does not remember   Biaxin [clarithromycin]    Ceftin [cefuroxime Axetil]    Duragesic Disc Transdermal System [fentanyl] Other (See Comments)   Reaction unknown   Forteo [parathyroid Hormone (recomb)] Other (See Comments)   Made skin dry   Morphine And Related Other (See Comments)   "My Sister and daughter can,t take it. I've had it before without problems."   Teriparatide    Lidocaine Rash      Medication List       Accurate as of April 08, 2020 11:59 PM. If you have any questions, ask your nurse or doctor.        acetaminophen 500 MG tablet Commonly known as: TYLENOL Take 500 mg by mouth every 6 (six) hours as needed for moderate pain. Every 6hrs.   acetaminophen 325 MG tablet Commonly known as: TYLENOL Take 650 mg by mouth 2 (two) times daily.   albuterol (2.5 MG/3ML) 0.083% nebulizer solution Commonly known as: PROVENTIL Take 2.5 mg by nebulization as directed. Take 1 neb  inhalation every 6 hours and Take 1 neb inhalation every 6 hours PRN FOR SOB AND WHEEZING   azelastine 0.1 % nasal spray Commonly known as: ASTELIN Place 2 sprays into both nostrils daily.   budesonide-formoterol 80-4.5 MCG/ACT inhaler Commonly known as: SYMBICORT Inhale 2 puffs into the lungs 2 (two) times daily.   buPROPion 150 MG 12 hr tablet Commonly known as: WELLBUTRIN SR Take 1 tablet (150 mg total) by mouth every morning.   cetirizine 10 MG tablet Commonly known as: ZYRTEC Take 10 mg by mouth daily.   demeclocycline 150 MG tablet Commonly known as: DECLOMYCIN Take 150 mg by mouth 2 (two) times daily.   diclofenac sodium 1 % Gel Commonly known as: VOLTAREN Apply 1 application topically 4 (four) times daily as needed (pain).  famotidine 20 MG tablet Commonly known as: PEPCID Take 20 mg by mouth daily.   feeding supplement (PRO-STAT SUGAR FREE 64) Liqd Take 30 mLs by mouth daily.   fluticasone 50 MCG/ACT nasal spray Commonly known as: FLONASE Place 2 sprays into both nostrils daily.   Iron 325 (65 Fe) MG Tabs Take 1 tablet by mouth every other day. Give with meals   Lubricating Eye Drops 0.5-0.9 % ophthalmic solution Generic drug: carboxymethylcellul-glycerin Place 1 drop into both eyes 3 (three) times daily as needed for dry eyes. For allergies   memantine 5 MG tablet Commonly known as: NAMENDA Take 5 mg by mouth 2 (two) times daily.   nystatin cream Commonly known as: MYCOSTATIN Apply 1 application topically 2 (two) times daily.   ondansetron 4 MG disintegrating tablet Commonly known as: ZOFRAN-ODT Take 4 mg by mouth every 6 (six) hours as needed for nausea or vomiting.   pantoprazole 40 MG tablet Commonly known as: PROTONIX Take 40 mg by mouth daily.   polyethylene glycol 17 g packet Commonly known as: MIRALAX / GLYCOLAX Take 17 g by mouth daily as needed for mild constipation.   pregabalin 25 MG capsule Commonly known as: Lyrica Take 1  capsule (25 mg total) by mouth 2 (two) times daily. What changed: when to take this Changed by: Mysty Kielty X Najiyah Paris, NP   torsemide 20 MG tablet Commonly known as: DEMADEX Take 20 mg by mouth daily.   triamcinolone cream 0.5 % Commonly known as: KENALOG Apply 1 application topically 2 (two) times daily.   zinc oxide 20 % ointment Apply 1 application topically as needed for irritation. To buttocks after every incontinent episode and as needed for redness. May keep at bedside       Review of Systems  Constitutional: Positive for appetite change and fatigue. Negative for activity change and fever.       Lethargic  HENT: Positive for hearing loss. Negative for congestion and voice change.   Eyes: Negative for visual disturbance.  Respiratory: Positive for cough and shortness of breath. Negative for wheezing.        DOE  Cardiovascular: Positive for leg swelling. Negative for chest pain and palpitations.  Gastrointestinal: Negative for abdominal pain and constipation.       C/o nauseated this morning, but better now, did not vomit, c/o loose stools.   Genitourinary: Negative for difficulty urinating, dysuria and urgency.  Musculoskeletal: Positive for arthralgias, back pain, gait problem and myalgias.       Right hip, lower back.   Skin: Negative for color change.  Neurological: Negative for speech difficulty, weakness, light-headedness and headaches.       More confused.   Psychiatric/Behavioral: Positive for confusion. Negative for sleep disturbance.    Immunization History  Administered Date(s) Administered  . DTaP 12/14/2011  . Influenza, High Dose Seasonal PF 07/12/2017, 08/03/2019  . Moderna SARS-COVID-2 Vaccination 10/23/2019, 11/18/2019  . Pneumococcal Conjugate-13 06/01/2014  . Pneumococcal Polysaccharide-23 06/11/1999   Pertinent  Health Maintenance Due  Topic Date Due  . DEXA SCAN  Never done  . INFLUENZA VACCINE  05/19/2020  . PNA vac Low Risk Adult  Completed   No  flowsheet data found. Functional Status Survey:    Vitals:   04/08/20 1557  BP: 128/74  Pulse: 80  Resp: 18  Temp: 97.9 F (36.6 C)  SpO2: 93%  Weight: 132 lb 6.4 oz (60.1 kg)  Height: '4\' 6"'  (1.372 m)   Body mass index is 31.92 kg/m. Physical  Exam Vitals and nursing note reviewed.  Constitutional:      General: She is not in acute distress.    Appearance: She is not ill-appearing.     Comments: frail  HENT:     Head: Normocephalic and atraumatic.     Mouth/Throat:     Mouth: Mucous membranes are dry.  Eyes:     Conjunctiva/sclera: Conjunctivae normal.     Pupils: Pupils are equal, round, and reactive to light.  Cardiovascular:     Rate and Rhythm: Normal rate and regular rhythm.     Heart sounds: Murmur heard.      Comments: HR in 60 upon my examination Pulmonary:     Breath sounds: Rales present. No wheezing or rhonchi.     Comments: Decreased air entry both lungs. Posterior mid to lower lung moist rales.  Abdominal:     General: Bowel sounds are normal.     Tenderness: There is no abdominal tenderness.  Musculoskeletal:     Cervical back: Normal range of motion and neck supple.     Right lower leg: Edema present.     Left lower leg: Edema present.     Comments: 1-2 + edema BLE. Chronic pain in the lower back, right hip.  Skin:    General: Skin is warm and dry.     Comments: Multiple ecchymoses all limbs  Neurological:     General: No focal deficit present.     Mental Status: She is alert.     Motor: No weakness.     Gait: Gait abnormal.     Comments: Oriented to person, place.   Psychiatric:        Mood and Affect: Mood normal.        Behavior: Behavior normal.     Labs reviewed: Recent Labs    03/28/20 0140 03/29/20 1506 04/01/20 0425 04/02/20 0441 04/03/20 0240  NA 145   < > 147* 145 142  K 4.0   < > 3.9 3.5 3.3*  CL 107   < > 107 107 104  CO2 30   < > '28 27 28  ' GLUCOSE 54*   < > 106* Cheryl 85  BUN 68*   < > 21 26* 38*  CREATININE 1.73*   <  > 0.82 0.99 1.24*  CALCIUM 8.3*   < > 8.3* 8.2* 7.9*  MG 2.2  --   --   --   --   PHOS 4.2  --   --   --   --    < > = values in this interval not displayed.   Recent Labs    11/23/19 0000 12/26/19 0000 03/27/20 0000  AST 15 16 32  ALT 13 15 34  ALKPHOS 82 108 201*  ALBUMIN 3.0* 3.0* 2.9*   Recent Labs    03/27/20 1436 03/28/20 0140 04/01/20 0425 04/02/20 0441 04/03/20 0240  WBC 15.3*   < > 14.3* 20.3* 21.0*  NEUTROABS 13.9*  --   --  16.8* 17.8*  HGB 11.9*   < > 12.9 12.7 11.2*  HCT 36.8   < > 40.2 39.3 34.0*  MCV 96.3   < > 97.3 94.9 93.9  PLT 188   < > 143* 156 171   < > = values in this interval not displayed.   Lab Results  Component Value Date   TSH 2.600 09/19/2018   No results found for: HGBA1C No results found for: CHOL, HDL, LDLCALC, LDLDIRECT, TRIG, CHOLHDL  Significant  Diagnostic Results in last 30 days:  CT HEAD WO CONTRAST  Result Date: 03/27/2020 CLINICAL DATA:  Cheryl year old [place] with encephalopathy. EXAM: CT HEAD WITHOUT CONTRAST TECHNIQUE: Contiguous axial images were obtained from the base of the skull through the vertex without intravenous contrast. COMPARISON:  Head CT 11/14/2005. FINDINGS: Brain: No midline shift, mass effect, or evidence of intracranial mass lesion. Cerebral volume is within normal limits for age. No ventriculomegaly. No acute intracranial hemorrhage identified. Widespread, patchy hypodensity in the bilateral cerebral white matter, deep gray nuclei. Progression since 2007. No acute cortically based infarct or cortical encephalomalacia identified. Vascular: Calcified atherosclerosis at the skull base. No suspicious intracranial vascular hyperdensity. Skull: Advanced chronic degeneration at the skull base-C1 and odontoid. Associated cervicomedullary junction stenosis (series 3, image 1). No acute osseous abnormality identified. Sinuses/Orbits: Visualized paranasal sinuses and mastoids are stable and well pneumatized. Other: No acute orbit  or scalp soft tissue finding. IMPRESSION: 1. No acute intracranial abnormality. Evidence of small vessel disease with progression since 2007. 2. Very severe degeneration of the skull base, C1, and odontoid with cervicomedullary junction stenosis. Electronically Signed   By: Genevie Ann M.D.   On: 03/27/2020 21:23   DG CHEST PORT 1 VIEW  Result Date: 04/01/2020 CLINICAL DATA:  Shortness of breath, altered mental status EXAM: PORTABLE CHEST 1 VIEW COMPARISON:  03/27/2020 FINDINGS: Stable cardiomediastinal contours. Atherosclerotic calcification of the aortic knob. Small left pleural effusion with associated left basilar opacity similar in appearance to prior. Right lung appears clear. No pneumothorax. Demineralized osseous structures with chronic arthropathy of the bilateral shoulders and numerous prior kyphoplasty/vertebroplasty changes of the visualized thoracic spine. IMPRESSION: Small left pleural effusion with associated left basilar opacity, similar in appearance to prior. Electronically Signed   By: Davina Poke D.O.   On: 04/01/2020 11:23   DG Chest Portable 1 View  Result Date: 03/27/2020 CLINICAL DATA:  Shortness of breath EXAM: PORTABLE CHEST 1 VIEW COMPARISON:  Feb 20, 2019 FINDINGS: There is airspace opacity in the left base with small left pleural effusion. The lungs elsewhere are clear. The heart is upper normal in size with pulmonary vascularity normal. There is aortic atherosclerosis. No adenopathy appreciable. There have been previous kyphoplasty procedures in the upper lumbar region. There is arthropathy in each shoulder with superior migration of each humeral head. Bones are osteoporotic. IMPRESSION: Airspace consolidation consistent with pneumonia left base with small left pleural effusion. Lungs elsewhere clear. Stable cardiac prominence. Aortic Atherosclerosis (ICD10-I70.0). Bones osteoporotic. Probable chronic rotator cuff tears bilaterally, characterized by superior migration of each  humeral head. Electronically Signed   By: Lowella Grip III M.D.   On: 03/27/2020 15:04   ECHOCARDIOGRAM COMPLETE  Result Date: 04/02/2020    ECHOCARDIOGRAM REPORT   Patient Name:   ALLAYNA ERLICH Date of Exam: 04/02/2020 Medical Rec #:  [id number]                Height:       54.0 in Accession #:    [id number]               Weight:       135.0 lb Date of Birth:  12/26/1922                 BSA:          1.464 m Patient Age:    Cheryl years                 BP:  120/50 mmHg Patient Gender: F                        HR:           86 bpm. Exam Location:  Inpatient Procedure: 2D Echo Indications:    CHF- Acute Diastolic X93.71  History:        Patient has prior history of Echocardiogram examinations, most                 recent 09/28/2018. Cardiomyopathy, Previous Myocardial                 Infarction, Aortic Valve Disease, Arrythmias:Atrial                 Fibrillation; Risk Factors:Hypertension.  Sonographer:    [name] RDCS (AE) Referring Phys: [place]  1. There is systolic left ventricular cavity obliteration with a mid chamber "gradient" up to 3 m/s. There is no evidence of systolic anterior motion of the mitral leaflet or true subvalvular dynamic obstruction and there are minimal degenerative valve leaflet changes. . Left ventricular ejection fraction, by estimation, is >75%. The left ventricle has hyperdynamic function. The left ventricle has no regional wall motion abnormalities. Left ventricular diastolic parameters are consistent with Grade I diastolic dysfunction (impaired relaxation).  2. Right ventricular systolic function is normal. The right ventricular size is normal. There is normal pulmonary artery systolic pressure. The estimated right ventricular systolic pressure is 69.6 mmHg.  3. The mitral valve is normal in structure. Mild mitral valve regurgitation.  4. Mildly increased aortic valve gradients due to hyperdynamic left ventricular function.  The aortic valve is normal in structure. Aortic valve regurgitation is not visualized. No aortic stenosis is present.  5. The inferior vena cava is normal in size with greater than 50% respiratory variability, suggesting right atrial pressure of 3 mmHg. Comparison(s): No significant change from prior study. Prior images reviewed side by side. FINDINGS  Left Ventricle: There is systolic left ventricular cavity obliteration with a mid chamber "gradient" up to 3 m/s. There is no evidence of systolic anterior motion of the mitral leaflet or true subvalvular dynamic obstruction and there are minimal degenerative valve leaflet changes. Left ventricular ejection fraction, by estimation, is >75%. The left ventricle has hyperdynamic function. The left ventricle has no regional wall motion abnormalities. The left ventricular internal cavity size was small. There is no left ventricular hypertrophy. Left ventricular diastolic parameters are consistent with Grade I diastolic dysfunction (impaired relaxation). Indeterminate filling pressures. Right Ventricle: The right ventricular size is normal. No increase in right ventricular wall thickness. Right ventricular systolic function is normal. There is normal pulmonary artery systolic pressure. The tricuspid regurgitant velocity is 2.74 m/s, and  with an assumed right atrial pressure of 3 mmHg, the estimated right ventricular systolic pressure is 78.9 mmHg. Left Atrium: Left atrial size was normal in size. Right Atrium: Right atrial size was normal in size. Pericardium: There is no evidence of pericardial effusion. Mitral Valve: The mitral valve is normal in structure. There is mild late systolic prolapse of both leaflets of the mitral valve. Mild mitral valve regurgitation. Tricuspid Valve: The tricuspid valve is normal in structure. Tricuspid valve regurgitation is trivial. Aortic Valve: Mildly increased aortic valve gradients due to hyperdynamic left ventricular function. The  aortic valve is normal in structure. Aortic valve regurgitation is not visualized. No aortic stenosis is present. Aortic valve mean gradient measures  8.0 mmHg.  Aortic valve peak gradient measures 20.1 mmHg. Aortic valve area, by VTI measures 1.69 cm. Pulmonic Valve: The pulmonic valve was grossly normal. Pulmonic valve regurgitation is trivial. Aorta: The aortic root was not well visualized. Venous: The inferior vena cava is normal in size with greater than 50% respiratory variability, suggesting right atrial pressure of 3 mmHg. IAS/Shunts: No atrial level shunt detected by color flow Doppler.  LEFT VENTRICLE PLAX 2D LVIDd:         2.80 cm  Diastology LVIDs:         2.00 cm  LV e' lateral:   5.87 cm/s LV PW:         1.00 cm  LV E/e' lateral: 12.1 LV IVS:        1.10 cm  LV e' medial:    3.92 cm/s LVOT diam:     2.00 cm  LV E/e' medial:  18.1 LV SV:         63 LV SV Index:   43 LVOT Area:     3.14 cm  RIGHT VENTRICLE TAPSE (M-mode): 1.3 cm LEFT ATRIUM           Index       RIGHT ATRIUM           Index LA diam:      1.80 cm 1.23 cm/m  RA Area:     11.00 cm LA Vol (A2C): 11.1 ml 7.58 ml/m  RA Volume:   20.80 ml  14.21 ml/m LA Vol (A4C): 40.5 ml 27.67 ml/m  AORTIC VALVE AV Area (Vmax):    1.50 cm AV Area (Vmean):   1.58 cm AV Area (VTI):     1.69 cm AV Vmax:           224.00 cm/s AV Vmean:          131.000 cm/s AV VTI:            0.371 m AV Peak Grad:      20.1 mmHg AV Mean Grad:      8.0 mmHg LVOT Vmax:         107.00 cm/s LVOT Vmean:        65.700 cm/s LVOT VTI:          0.199 m LVOT/AV VTI ratio: 0.54  AORTA Ao Root diam: 2.40 cm MITRAL VALVE                TRICUSPID VALVE MV Area (PHT): 3.54 cm     TR Peak grad:   30.0 mmHg MV Decel Time: 214 msec     TR Vmax:        274.00 cm/s MV E velocity: 71.10 cm/s MV A velocity: 174.00 cm/s  SHUNTS MV E/A ratio:  0.41         Systemic VTI:  0.20 m                             Systemic Diam: 2.00 cm Dani Gobble Croitoru MD Electronically signed by Sanda Klein MD  Signature Date/Time: 04/02/2020/2:19:17 PM    Final    DG Hip Port Unilat W or Wo Pelvis 1 View Right  Result Date: 03/27/2020 CLINICAL DATA:  Recent hip surgery, multiple falls EXAM: DG HIP (WITH OR WITHOUT PELVIS) 1V PORT RIGHT COMPARISON:  12/21/2018 FINDINGS: Frontal view of the pelvis as well as frontal view of the right hip are obtained. Since the prior exam, intramedullary rod with proximal dynamic  and distal interlocking screw has been placed across the previously seen intertrochanteric right hip fracture. There has been bony resorption of the femoral head, with migration of the dynamic screw now resulting in acetabular roof erosion. Residual fracture line can be seen along the remaining portion of the femoral neck consistent with nonunion. No acute bony abnormality.  Stable left hip osteoarthritis. IMPRESSION: 1. 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 Bony resorption of the majority of the right femoral head, with bony erosion of the acetabular roof by the dynamic screw. 2. Stable left hip osteoarthritis. Electronically Signed   By: Randa Ngo M.D.   On: 03/27/2020 15:29    Assessment/Plan: Decrease in appetite Will try Mirtazapine 7.89m qd, update CBC/diff, CMP/eGFR  Hypernatremia Stable, continue Demeclocycline   Polymyalgia rheumatica Better control pain, increase Lyrica 232mbid, continue  Tylenol.   Diastolic dysfunction Chronic, 1-2+  edema BLE, DOE in setting of COPD, moist rales noted, increase  Torsemide 2071mod, 8m103md.   Cognitive impairment Continue Memantine for memory.   Anemia Stable, continue Fe  Dep[facility] due to [facility] mood is stable, continue Wellbutrin.   Slow transit constipation Stable, continue prn MIraLax.   Esophageal reflux Stable, continue Famotidine, Pantoprazole, prn Zofran.   Chronic bronchitis (HCC) chronic DOE, cough, continue prn Albuterol Neb, Symbicort bid, Cetirizine 10mg102m  Fluticasone nasal spray qd.    Persistent atrial fibrillation (HCC) Heart rate is in control, not taking anticoagulation due to Hx of GI bleed.     Family/ staff Communication: plan of care reviewed with the patient and charge nurse.   Labs/tests ordered: CBC/diff, CMP/eGFR  Time spend 35 minutes.

## 2020-04-08 NOTE — Assessment & Plan Note (Signed)
chronic DOE, cough, continue prn Albuterol Neb, Symbicort bid, Cetirizine 10mg  qd, Fluticasone nasal spray qd.

## 2020-04-08 NOTE — Assessment & Plan Note (Signed)
Heart rate is in control, not taking anticoagulation due to Hx of GI bleed.

## 2020-04-08 NOTE — Assessment & Plan Note (Addendum)
Better control pain, increase Lyrica 25mg  bid, continue  Tylenol.

## 2020-04-08 NOTE — Assessment & Plan Note (Signed)
Stable, continue Demeclocycline

## 2020-04-08 NOTE — Assessment & Plan Note (Signed)
Her mood is stable, continue Wellbutrin.

## 2020-04-08 NOTE — Assessment & Plan Note (Signed)
Stable, continue Fe 

## 2020-04-08 NOTE — Assessment & Plan Note (Signed)
Will try Mirtazapine 7.71m qd, update CBC/diff, CMP/eGFR

## 2020-04-08 NOTE — Assessment & Plan Note (Signed)
Stable, continue prn MIraLax.

## 2020-04-08 NOTE — Assessment & Plan Note (Addendum)
Chronic, 1-2+  edema BLE, DOE in setting of COPD, moist rales noted, increase  Torsemide 20mg  qod, 40mg  qod.

## 2020-04-09 ENCOUNTER — Encounter: Payer: Self-pay | Admitting: Nurse Practitioner

## 2020-04-09 LAB — HEPATIC FUNCTION PANEL
ALT: 30 (ref 7–35)
AST: 27 (ref 13–35)
Alkaline Phosphatase: 169 — AB (ref 25–125)
Bilirubin, Total: 0.4

## 2020-04-09 LAB — BASIC METABOLIC PANEL
BUN: 54 — AB (ref 4–21)
CO2: 29 — AB (ref 13–22)
Chloride: 100 (ref 99–108)
Creatinine: 1.3 — AB (ref <=1.1)
Glucose: 74
Potassium: 3.6 (ref 3.4–5.3)
Sodium: 138 (ref 137–147)

## 2020-04-09 LAB — COMPREHENSIVE METABOLIC PANEL
Calcium: 7.7 — AB (ref 8.7–10.7)
GFR calc Af Amer: 40
GFR calc non Af Amer: 35
Globulin: 2.4

## 2020-04-09 LAB — CBC AND DIFFERENTIAL
HCT: 30 — AB (ref 36–46)
Hemoglobin: 9.9 — AB (ref 12.0–16.0)
Neutrophils Absolute: 10292
Platelets: 457 — AB (ref 150–399)
WBC: 12.4

## 2020-04-09 LAB — CBC: RBC: 3.17 — AB (ref 3.87–5.11)

## 2020-04-11 ENCOUNTER — Non-Acute Institutional Stay: Payer: Medicare PPO | Admitting: Internal Medicine

## 2020-04-11 ENCOUNTER — Encounter: Payer: Self-pay | Admitting: Internal Medicine

## 2020-04-11 ENCOUNTER — Non-Acute Institutional Stay (SKILLED_NURSING_FACILITY): Payer: Medicare PPO | Admitting: Internal Medicine

## 2020-04-11 DIAGNOSIS — R630 Anorexia: Secondary | ICD-10-CM

## 2020-04-11 DIAGNOSIS — I4819 Other persistent atrial fibrillation: Secondary | ICD-10-CM

## 2020-04-11 DIAGNOSIS — Z7189 Other specified counseling: Secondary | ICD-10-CM

## 2020-04-11 DIAGNOSIS — R0902 Hypoxemia: Secondary | ICD-10-CM

## 2020-04-11 DIAGNOSIS — I5033 Acute on chronic diastolic (congestive) heart failure: Secondary | ICD-10-CM

## 2020-04-11 DIAGNOSIS — J42 Unspecified chronic bronchitis: Secondary | ICD-10-CM

## 2020-04-11 DIAGNOSIS — D649 Anemia, unspecified: Secondary | ICD-10-CM | POA: Diagnosis not present

## 2020-04-11 DIAGNOSIS — Z515 Encounter for palliative care: Secondary | ICD-10-CM

## 2020-04-11 MED ORDER — LORAZEPAM 0.5 MG PO TABS
0.5000 mg | ORAL_TABLET | ORAL | 1 refills | Status: AC | PRN
Start: 2020-04-11 — End: ?

## 2020-04-11 MED ORDER — MORPHINE SULFATE (CONCENTRATE) 20 MG/ML PO SOLN: 5.0000 mg | ORAL | 0 refills | Status: AC | PRN

## 2020-04-11 NOTE — Progress Notes (Signed)
Location: [facility] manager of Service:  SNF (31)  Provider:   Code Status: DNR Goals of Care:  Advanced Directives 04/04/2020  Does Patient Have a Medical Advance Directive? Yes  Type of Paramedic of [place];Out of facility DNR (pink MOST or yellow form);Living will  Does patient want to make changes to medical advance directive? No - Patient declined  Copy of [place] in Chart? Yes - validated most recent copy scanned in chart (See row information)  Would patient like information on creating a medical advance directive? -  Pre-existing out of facility DNR order (yellow form or pink MOST form) -     Chief Complaint  Patient presents with  . Acute Visit    HPI: Patient is a 86 y.o. female seen today for an acute visit for SOB And Low BP   Patient was Recently admitted in the hospital from 6/9-6/16 for left lower lobe pneumonia   She has a history of Takotsubo's cardiomyopathy with EF of 55 to 57%, diastolic dysfunction, hypertension, PAF not on any anticoagulation, SIADH, depressionand S/P Right Hip Fractureunderwent InterMedullary Implant on 03/04. And recentdisplaced fracture of distal right ulnarDue to Fall  Palliative nurse came for Regular visit with the patient and she found patient to be tachycardic, SOB and Low BP.She has not been eating well for past 2 weeks Patient continues to be alert c/o Pain in her Back. Talked to the son who wants her mom to be comfortable and not send her to Hospital for further Eval   Past Medical History:  Diagnosis Date  . Amputation of finger of right hand   . Aortic stenosis, mild 03/03/2016   By echo 08/2015  . Arthritis   . Back pain   . Bradycardia 08/28/2015  . CKD (chronic kidney disease) stage 3, GFR 30-59 ml/min   . Closed right hip fracture ([place]) 12/2018  . Depression   . GERD (gastroesophageal reflux disease)   . GI bleed due to NSAIDs   . Hyperlipidemia     . Hypertension   . Macular degeneration   . Maxillary sinusitis   . MVP (mitral valve prolapse) 03/03/2016   Bileaflet MVP with mild MR by echo 2016  . Old MI (myocardial infarction)    NSTEMI secondary to stress MI/Takotsubo CM, minimal nonobstructive ASCAD at cath  . Osteoporosis   . Persistent atrial fibrillation ([place]) 2006   not on anticoagulation due to GI bleed and increased fall risk  . PMR (polymyalgia rheumatica) (HCC)   . SIADH (syndrome of inappropriate ADH production) ([facility])   . Takotsubo cardiomyopathy    EF normalized to 70%  . Ventricular tachycardia ([place])    on initial presentation of MI    Past Surgical History:  Procedure Laterality Date  . CARDIAC CATHETERIZATION     normal coronary arteries  . CHOLECYSTECTOMY  11/10/2011   Procedure: LAPAROSCOPIC CHOLECYSTECTOMY WITH INTRAOPERATIVE CHOLANGIOGRAM;  Surgeon: Pedro Earls, MD;  Location: [facility];  Service: General;  Laterality: N/A;  . EYE SURGERY  06/10/11   membrane peel   . INTRAMEDULLARY (IM) NAIL INTERTROCHANTERIC Right 12/21/2018   Procedure: INTRAMEDULLARY (IM) NAIL INTERTROCHANTRIC;  Surgeon: Nicholes Stairs, MD;  Location: [facility];  Service: Orthopedics;  Laterality: Right;  . NASAL SINUS SURGERY Left   . ROTATOR CUFF REPAIR  1998  . UMBILICAL HERNIA REPAIR  11/10/2011   Procedure: HERNIA REPAIR UMBILICAL ADULT;  Surgeon: Pedro Earls, MD;  Location: [facility];  Service: General;  Laterality: N/A;  . WISDOM TOOTH EXTRACTION      Allergies  Allergen Reactions  . Amlodipine Swelling    To feet and ankles   . Avelox [Moxifloxacin Hcl In Nacl] Other (See Comments)    Pt does not remember   . Biaxin [Clarithromycin]   . Ceftin [Cefuroxime Axetil]   . Duragesic Disc Transdermal System [Fentanyl] Other (See Comments)    Reaction unknown  . Forteo [Parathyroid Hormone (Recomb)] Other (See Comments)    Made skin dry  . Morphine And Related Other (See Comments)    "My Sister and daughter can,t take  it. I've had it before without problems."  . Teriparatide   . Lidocaine Rash    Outpatient Encounter Medications as of 04/11/2020  Medication Sig  . azelastine (ASTELIN) 0.1 % nasal spray Place 2 sprays into both nostrils 2 (two) times daily. Use in each nostril as directed  . ferrous sulfate 325 (65 FE) MG EC tablet Take 325 mg by mouth every other day.  . fluticasone (FLONASE) 50 MCG/ACT nasal spray Place 2 sprays into both nostrils daily.  . pregabalin (LYRICA) 25 MG capsule Take 25 mg by mouth 2 (two) times daily.  Mar[place] Kitchen acetaminophen (TYLENOL) 325 MG tablet Take 650 mg by mouth 2 (two) times daily.   Marland Kitchen acetaminophen (TYLENOL) 500 MG tablet Take 500 mg by mouth every 6 (six) hours as needed for moderate pain. Every 6hrs.  . albuterol (PROVENTIL) (2.5 MG/3ML) 0.083% nebulizer solution Take 2.5 mg by nebulization as directed. Take 1 neb inhalation every 6 hours and Take 1 neb inhalation every 6 hours PRN FOR SOB AND WHEEZING  . carboxymethylcellul-glycerin (LUBRICATING EYE DROPS) 0.5-0.9 % ophthalmic solution Place 1 drop into both eyes 3 (three) times daily as needed for dry eyes. For allergies  . diclofenac sodium (VOLTAREN) 1 % GEL Apply 1 application topically 4 (four) times daily as needed (pain).   . LORazepam (ATIVAN) 0.5 MG tablet Take 1 tablet (0.5 mg total) by mouth every 4 (four) hours as needed for anxiety.  Marland Kitchen morphine (ROXANOL) 20 MG/ML concentrated solution Take 0.25 mLs (5 mg total) by mouth every 2 (two) hours as needed for severe pain.  Marland Kitchen nystatin cream (MYCOSTATIN) Apply 1 application topically 2 (two) times daily.   . ondansetron (ZOFRAN-ODT) 4 MG disintegrating tablet Take 4 mg by mouth every 6 (six) hours as needed for nausea or vomiting.   . pantoprazole (PROTONIX) 40 MG tablet Take 40 mg by mouth daily.  . polyethylene glycol (MIRALAX / GLYCOLAX) packet Take 17 g by mouth daily as needed for mild constipation.   . torsemide (DEMADEX) 20 MG tablet Take 40 mg by mouth daily.     Marland Kitchen triamcinolone cream (KENALOG) 0.5 % Apply 1 application topically 2 (two) times daily.  Marland Kitchen zinc oxide 20 % ointment Apply 1 application topically as needed for irritation. To buttocks after every incontinent episode and as needed for redness. May keep at bedside  . [DISCONTINUED] Amino Acids-Protein Hydrolys (FEEDING SUPPLEMENT, PRO-STAT SUGAR FREE 64,) LIQD Take 30 mLs by mouth daily.  . [DISCONTINUED] azelastine (ASTELIN) 0.1 % nasal spray Place 2 sprays into both nostrils daily.   . [DISCONTINUED] budesonide-formoterol (SYMBICORT) 80-4.5 MCG/ACT inhaler Inhale 2 puffs into the lungs 2 (two) times daily.   . [DISCONTINUED] buPROPion (WELLBUTRIN SR) 150 MG 12 hr tablet Take 1 tablet (150 mg total) by mouth every morning.  . [DISCONTINUED] cetirizine (ZYRTEC) 10 MG tablet Take 10 mg  by mouth daily.   . [DISCONTINUED] demeclocycline (DECLOMYCIN) 150 MG tablet Take 150 mg by mouth 2 (two) times daily.   . [DISCONTINUED] famotidine (PEPCID) 20 MG tablet Take 20 mg by mouth daily.  . [DISCONTINUED] Ferrous Sulfate (IRON) 325 (65 Fe) MG TABS Take 1 tablet by mouth every other day. Give with meals  . [DISCONTINUED] fluticasone (FLONASE) 50 MCG/ACT nasal spray Place 2 sprays into both nostrils daily.   . [DISCONTINUED] memantine (NAMENDA) 5 MG tablet Take 5 mg by mouth 2 (two) times daily.   . [DISCONTINUED] pregabalin (LYRICA) 25 MG capsule Take 1 capsule (25 mg total) by mouth 2 (two) times daily. (Patient taking differently: Take 25 mg by mouth daily. )   No facility-administered encounter medications on file as of 04/11/2020.    Review of Systems:  Review of Systems  Constitutional: Positive for activity change, appetite change and unexpected weight change.  HENT: Negative.   Respiratory: Positive for cough, shortness of breath and wheezing.   Cardiovascular: Positive for leg swelling.  Gastrointestinal: Positive for nausea.  Genitourinary: Negative.   Musculoskeletal: Positive for  arthralgias, back pain and gait problem.  Neurological: Positive for weakness.  Psychiatric/Behavioral: Positive for confusion.    Health Maintenance  Topic Date Due  . TETANUS/TDAP  Never done  . DEXA SCAN  Never done  . INFLUENZA VACCINE  05/19/2020  . COVID-19 Vaccine  Completed  . PNA vac Low Risk Adult  Completed    Physical Exam: Vitals:   04/11/20 1646  BP: (!) 75/40  Pulse: (!) 116  Resp: (!) 25  Temp: (!) 97.5 F (36.4 C)  SpO2: (!) 83%   There is no height or weight on file to calculate BMI. Physical Exam Vitals reviewed.  Constitutional:      Appearance: Normal appearance.  HENT:     Head: Normocephalic.     Nose: Nose normal.     Mouth/Throat:     Mouth: Mucous membranes are moist.     Pharynx: Oropharynx is clear.  Eyes:     Pupils: Pupils are equal, round, and reactive to light.  Cardiovascular:     Rate and Rhythm: Tachycardia present. Rhythm irregular.     Heart sounds: Murmur heard.   Pulmonary:     Breath sounds: Wheezing and rales present.  Abdominal:     General: Abdomen is flat. Bowel sounds are normal.     Palpations: Abdomen is soft.  Musculoskeletal:     Cervical back: Neck supple.     Comments: Moderate Swelling Bilateral  Skin:    Comments: Bruises and Pressure wounds  Neurological:     Mental Status: She is alert.     Comments: Alert And Responsive  Psychiatric:        Mood and Affect: Mood normal.     Labs reviewed: Basic Metabolic Panel: Recent Labs    03/28/20 0140 03/29/20 1506 04/01/20 0425 04/02/20 0441 04/03/20 0240  NA 145   < > 147* 145 142  K 4.0   < > 3.9 3.5 3.3*  CL 107   < > 107 107 104  CO2 30   < > 28 27 28   GLUCOSE 54*   < > 106* 84 85  BUN 68*   < > 21 26* 38*  CREATININE 1.73*   < > 0.82 0.99 1.24*  CALCIUM 8.3*   < > 8.3* 8.2* 7.9*  MG 2.2  --   --   --   --   PHOS  4.2  --   --   --   --    < > = values in this interval not displayed.   Liver Function Tests: Recent Labs    11/23/19 0000  12/26/19 0000 03/27/20 0000  AST 15 16 32  ALT 13 15 34  ALKPHOS 82 108 201*  ALBUMIN 3.0* 3.0* 2.9*   No results for input(s): LIPASE, AMYLASE in the last 8760 hours. No results for input(s): AMMONIA in the last 8760 hours. CBC: Recent Labs    03/27/20 1436 03/28/20 0140 04/01/20 0425 04/02/20 0441 04/03/20 0240  WBC 15.3*   < > 14.3* 20.3* 21.0*  NEUTROABS 13.9*  --   --  16.8* 17.8*  HGB 11.9*   < > 12.9 12.7 11.2*  HCT 36.8   < > 40.2 39.3 34.0*  MCV 96.3   < > 97.3 94.9 93.9  PLT 188   < > 143* 156 171   < > = values in this interval not displayed.   Lipid Panel: No results for input(s): CHOL, HDL, LDLCALC, TRIG, CHOLHDL, LDLDIRECT in the last 8760 hours. No results found for: HGBA1C  Procedures since last visit: CT HEAD WO CONTRAST  Result Date: 03/27/2020 CLINICAL DATA:  86 year old female with encephalopathy. EXAM: CT HEAD WITHOUT CONTRAST TECHNIQUE: Contiguous axial images were obtained from the base of the skull through the vertex without intravenous contrast. COMPARISON:  Head CT 11/14/2005. FINDINGS: Brain: No midline shift, mass effect, or evidence of intracranial mass lesion. Cerebral volume is within normal limits for age. No ventriculomegaly. No acute intracranial hemorrhage identified. Widespread, patchy hypodensity in the bilateral cerebral white matter, deep gray nuclei. Progression since 2007. No acute cortically based infarct or cortical encephalomalacia identified. Vascular: Calcified atherosclerosis at the skull base. No suspicious intracranial vascular hyperdensity. Skull: Advanced chronic degeneration at the skull base-C1 and odontoid. Associated cervicomedullary junction stenosis (series 3, image 1). No acute osseous abnormality identified. Sinuses/Orbits: Visualized paranasal sinuses and mastoids are stable and well pneumatized. Other: No acute orbit or scalp soft tissue finding. IMPRESSION: 1. No acute intracranial abnormality. Evidence of small vessel  disease with progression since 2007. 2. Very severe degeneration of the skull base, C1, and odontoid with cervicomedullary junction stenosis. Electronically Signed   By: Genevie Ann M.D.   On: 03/27/2020 21:23   DG CHEST PORT 1 VIEW  Result Date: 04/01/2020 CLINICAL DATA:  Shortness of breath, altered mental status EXAM: PORTABLE CHEST 1 VIEW COMPARISON:  03/27/2020 FINDINGS: Stable cardiomediastinal contours. Atherosclerotic calcification of the aortic knob. Small left pleural effusion with associated left basilar opacity similar in appearance to prior. Right lung appears clear. No pneumothorax. Demineralized osseous structures with chronic arthropathy of the bilateral shoulders and numerous prior kyphoplasty/vertebroplasty changes of the visualized thoracic spine. IMPRESSION: Small left pleural effusion with associated left basilar opacity, similar in appearance to prior. Electronically Signed   By: Davina Poke D.O.   On: 04/01/2020 11:23   DG Chest Portable 1 View  Result Date: 03/27/2020 CLINICAL DATA:  Shortness of breath EXAM: PORTABLE CHEST 1 VIEW COMPARISON:  Feb 20, 2019 FINDINGS: There is airspace opacity in the left base with small left pleural effusion. The lungs elsewhere are clear. The heart is upper normal in size with pulmonary vascularity normal. There is aortic atherosclerosis. No adenopathy appreciable. There have been previous kyphoplasty procedures in the upper lumbar region. There is arthropathy in each shoulder with superior migration of each humeral head. Bones are osteoporotic. IMPRESSION: Airspace consolidation consistent with pneumonia  left base with small left pleural effusion. Lungs elsewhere clear. Stable cardiac prominence. Aortic Atherosclerosis (ICD10-I70.0). Bones osteoporotic. Probable chronic rotator cuff tears bilaterally, characterized by superior migration of each humeral head. Electronically Signed   By: Lowella Grip III M.D.   On: 03/27/2020 15:04    ECHOCARDIOGRAM COMPLETE  Result Date: 04/02/2020    ECHOCARDIOGRAM REPORT   Patient Name:   [name] Date of Exam: 04/02/2020 Medical Rec #:  [id number]                Height:       54.0 in Accession #:    [id number]               Weight:       135.0 lb Date of Birth:  January 06, 1923                 BSA:          1.464 m Patient Age:    89 years                 BP:           120/50 mmHg Patient Gender: F                        HR:           86 bpm. Exam Location:  Inpatient Procedure: 2D Echo Indications:    CHF- Acute Diastolic E23.36  History:        Patient has prior history of Echocardiogram examinations, most                 recent 09/28/2018. Cardiomyopathy, Previous Myocardial                 Infarction, Aortic Valve Disease, Arrythmias:Atrial                 Fibrillation; Risk Factors:Hypertension.  Sonographer:    [name] RDCS (AE) Referring Phys: [place]  1. There is systolic left ventricular cavity obliteration with a mid chamber "gradient" up to 3 m/s. There is no evidence of systolic anterior motion of the mitral leaflet or true subvalvular dynamic obstruction and there are minimal degenerative valve leaflet changes. . Left ventricular ejection fraction, by estimation, is >75%. The left ventricle has hyperdynamic function. The left ventricle has no regional wall motion abnormalities. Left ventricular diastolic parameters are consistent with Grade I diastolic dysfunction (impaired relaxation).  2. Right ventricular systolic function is normal. The right ventricular size is normal. There is normal pulmonary artery systolic pressure. The estimated right ventricular systolic pressure is 12.2 mmHg.  3. The mitral valve is normal in structure. Mild mitral valve regurgitation.  4. Mildly increased aortic valve gradients due to hyperdynamic left ventricular function. The aortic valve is normal in structure. Aortic valve regurgitation is not visualized. No  aortic stenosis is present.  5. The inferior vena cava is normal in size with greater than 50% respiratory variability, suggesting right atrial pressure of 3 mmHg. Comparison(s): No significant change from prior study. Prior images reviewed side by side. FINDINGS  Left Ventricle: There is systolic left ventricular cavity obliteration with a mid chamber "gradient" up to 3 m/s. There is no evidence of systolic anterior motion of the mitral leaflet or true subvalvular dynamic obstruction and there are minimal degenerative valve leaflet changes. Left ventricular ejection fraction, by estimation, is >75%. The left ventricle has hyperdynamic function. The  left ventricle has no regional wall motion abnormalities. The left ventricular internal cavity size was small. There is no left ventricular hypertrophy. Left ventricular diastolic parameters are consistent with Grade I diastolic dysfunction (impaired relaxation). Indeterminate filling pressures. Right Ventricle: The right ventricular size is normal. No increase in right ventricular wall thickness. Right ventricular systolic function is normal. There is normal pulmonary artery systolic pressure. The tricuspid regurgitant velocity is 2.74 m/s, and  with an assumed right atrial pressure of 3 mmHg, the estimated right ventricular systolic pressure is 93.9 mmHg. Left Atrium: Left atrial size was normal in size. Right Atrium: Right atrial size was normal in size. Pericardium: There is no evidence of pericardial effusion. Mitral Valve: The mitral valve is normal in structure. There is mild late systolic prolapse of both leaflets of the mitral valve. Mild mitral valve regurgitation. Tricuspid Valve: The tricuspid valve is normal in structure. Tricuspid valve regurgitation is trivial. Aortic Valve: Mildly increased aortic valve gradients due to hyperdynamic left ventricular function. The aortic valve is normal in structure. Aortic valve regurgitation is not visualized. No aortic  stenosis is present. Aortic valve mean gradient measures  8.0 mmHg. Aortic valve peak gradient measures 20.1 mmHg. Aortic valve area, by VTI measures 1.69 cm. Pulmonic Valve: The pulmonic valve was grossly normal. Pulmonic valve regurgitation is trivial. Aorta: The aortic root was not well visualized. Venous: The inferior vena cava is normal in size with greater than 50% respiratory variability, suggesting right atrial pressure of 3 mmHg. IAS/Shunts: No atrial level shunt detected by color flow Doppler.  LEFT VENTRICLE PLAX 2D LVIDd:         2.80 cm  Diastology LVIDs:         2.00 cm  LV e' lateral:   5.87 cm/s LV PW:         1.00 cm  LV E/e' lateral: 12.1 LV IVS:        1.10 cm  LV e' medial:    3.92 cm/s LVOT diam:     2.00 cm  LV E/e' medial:  18.1 LV SV:         63 LV SV Index:   43 LVOT Area:     3.14 cm  RIGHT VENTRICLE TAPSE (M-mode): 1.3 cm LEFT ATRIUM           Index       RIGHT ATRIUM           Index LA diam:      1.80 cm 1.23 cm/m  RA Area:     11.00 cm LA Vol (A2C): 11.1 ml 7.58 ml/m  RA Volume:   20.80 ml  14.21 ml/m LA Vol (A4C): 40.5 ml 27.67 ml/m  AORTIC VALVE AV Area (Vmax):    1.50 cm AV Area (Vmean):   1.58 cm AV Area (VTI):     1.69 cm AV Vmax:           224.00 cm/s AV Vmean:          131.000 cm/s AV VTI:            0.371 m AV Peak Grad:      20.1 mmHg AV Mean Grad:      8.0 mmHg LVOT Vmax:         107.00 cm/s LVOT Vmean:        65.700 cm/s LVOT VTI:          0.199 m LVOT/AV VTI ratio: 0.54  AORTA Ao Root diam: 2.40 cm MITRAL  VALVE                TRICUSPID VALVE MV Area (PHT): 3.54 cm     TR Peak grad:   30.0 mmHg MV Decel Time: 214 msec     TR Vmax:        274.00 cm/s MV E velocity: 71.10 cm/s MV A velocity: 174.00 cm/s  SHUNTS MV E/A ratio:  0.41         Systemic VTI:  0.20 m                             Systemic Diam: 2.00 cm Dani Gobble Croitoru MD Electronically signed by Sanda Klein MD Signature Date/Time: 04/02/2020/2:19:17 PM    Final    DG Hip Port Unilat W or Wo Pelvis 1 View  Right  Result Date: 03/27/2020 CLINICAL DATA:  Recent hip surgery, multiple falls EXAM: DG HIP (WITH OR WITHOUT PELVIS) 1V PORT RIGHT COMPARISON:  12/21/2018 FINDINGS: Frontal view of the pelvis as well as frontal view of the right hip are obtained. Since the prior exam, intramedullary rod with proximal dynamic and distal interlocking screw has been placed across the previously seen intertrochanteric right hip fracture. There has been bony resorption of the femoral head, with migration of the dynamic screw now resulting in acetabular roof erosion. Residual fracture line can be seen along the remaining portion of the femoral neck consistent with nonunion. No acute bony abnormality.  Stable left hip osteoarthritis. IMPRESSION: 1. 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 Bony resorption of the majority of the right femoral head, with bony erosion of the acetabular roof by the dynamic screw. 2. Stable left hip osteoarthritis. Electronically Signed   By: Randa Ngo M.D.   On: 03/27/2020 15:29    Assessment/Plan CHF Excerabation and Possible Coronary Disease With her Low BP , Tachycardia and Hypoxia   D/W the family They want her to be comfortable Do not want Hospice yet Want her to stay in the facility Will discontinue most of her Meds Will Continue Demadex and Nebs PRN Increase the Demadex to 40 mg QD Also Zofran PRN Start her on Roxanol and Ativan Prn for Comfort    Labs/tests ordered:  * No order type specified * Next appt:  Visit date not found

## 2020-04-12 ENCOUNTER — Other Ambulatory Visit: Payer: Self-pay

## 2020-04-12 NOTE — Progress Notes (Signed)
[facility] Consult Note Telephone: [phone]  Fax: [phone]  PATIENT NAME: [name] DOB: December 01, 1922 MRN: [id number]  PRIMARY CARE PROVIDER:   [name], MD  REFERRING PROVIDER:  [name], MD [place],  [place] [id number]  RESPONSIBLE PARTY:    Vasey,Bob Son [phone]     RECOMMENDATIONS and PLAN:  Palliative care encounter  Z51.3  1.  Advance care planning: Notified Dr. Lyndel Safe of current status.  Phoned son and spoke with him in person after he returned to the facility related to acute change of patient status with respiratory and cardiovascular decline.  Explanation of findings and discussed poor prognosis in coordination with Dr. Lyndel Safe.  Discussed need for Hospice care however, patient appears to be approaching end of life rapidly. Son is in agreement to transition all care to comfort measures only at the facility.  MOST form was re-evaluated and a new form designating DNAR, comfort measures, no antibiotics, no IVs and no feeding tube was completed and signed by son.  MOST and golden rod forms are in place in facility notebook.  Son states that he was aware that she was in very poor health and "does not want her to suffer anymore".  He and his wife will visit at pt's bedside. Multiple medications were discontinued by Dr. Lyndel Safe and comfort meds ordered.  Palliative care will f/u on patient status on 04/12/20.  Consider hospice referral for additional support if patient survives this evening.  2.  Respiratory distress with hypoxia:  Oxygen at 4L/min applied and O2 sats improved to 84% at best.  Continue supplemental O2 for comfort.  Utilized Morphine concentrate as needed for dyspnea/air hunger.   3.  Edema:  Related to poor nutritional status and CHF.  Elevated head of bed for comfort.  Demadex will be continued.  Monitor  4.  Protein calorie malnutrition: Comfort nutrition as desired.  No  plans for tube feedings.  I spent 90 minutes providing this consultation,  from 1530 to 1700. More than 50% of the time in this consultation was spent coordinating communication with clinical staff. Dr. Lyndel Safe, patient and son/DIL.     HISTORY OF PRESENT ILLNESS:  [name] is a 86 y.o. year old female with multiple medical problems including CHF, MI, Takotsubo cardiomyopathy, CKD,afib.  Staff and son report poor nutritional intake over past [redacted] weeks along with increased weakness.Marland Kitchen  She was admitted to the hospital from 6/9-6/16/21 due to pneumonia, UTI and mental status changes. Today, she is in a an acute state of decline with noted dyspnea with hypoxia 79% on room air, tachycardia and hypotension. (see vitals below). Patient also complains of pain of her R "shoulder blade" area. She denies chest pain.  No reports of acute findings earlier today. Supplemental oxygen was applied and improved O2 sats minimally.  Decreased respiratory effort. Hypotension remained. Palliative Care was asked to help address goals of care.  CODE STATUS: DNAR/DNI  PPS: 20%  Sips and bites HOSPICE ELIGIBILITY/DIAGNOSIS: YES/ End stage cardiovasculare disease/ CHF  PAST MEDICAL HISTORY:  Past Medical History:  Diagnosis Date   Amputation of finger of right hand    Aortic stenosis, mild 03/03/2016   By echo 08/2015   Arthritis    Back pain    Bradycardia 08/28/2015   CKD (chronic kidney disease) stage 3, GFR 30-59 ml/min    Closed right hip fracture ([facility]) 12/2018   Depression    GERD (gastroesophageal  reflux disease)    GI bleed due to NSAIDs    Hyperlipidemia    Hypertension    Macular degeneration    Maxillary sinusitis    MVP (mitral valve prolapse) 03/03/2016   Bileaflet MVP with mild MR by echo 2016   Old MI (myocardial infarction)    NSTEMI secondary to stress MI/Takotsubo CM, minimal nonobstructive ASCAD at cath   Osteoporosis    Persistent atrial fibrillation ([place]) 2006     not on anticoagulation due to GI bleed and increased fall risk   PMR (polymyalgia rheumatica) ([facility])    SIADH (syndrome of inappropriate ADH production) ([facility])    Takotsubo cardiomyopathy    EF normalized to 70%   Ventricular tachycardia ([place])    on initial presentation of MI    SOCIAL HX: Resident at assisted living facility Social History   Tobacco Use   Smoking status: Never Smoker   Smokeless tobacco: Never Used  Substance Use Topics   Alcohol use: No    Alcohol/week: 1.0 standard drink    Types: 1 Glasses of wine per week    Comment: occassionally    ALLERGIES:  Allergies  Allergen Reactions   Amlodipine Swelling    To feet and ankles    Avelox [Moxifloxacin Hcl In Nacl] Other (See Comments)    Pt does not remember    Biaxin [Clarithromycin]    Ceftin [Cefuroxime Axetil]    Duragesic Disc Transdermal System [Fentanyl] Other (See Comments)    Reaction unknown   Forteo [Parathyroid Hormone (Recomb)] Other (See Comments)    Made skin dry   Morphine And Related Other (See Comments)    "My Sister and daughter can,t take it. I've had it before without problems."   Teriparatide    Lidocaine Rash     PERTINENT MEDICATIONS:  Outpatient Encounter Medications as of 04/11/2020  Medication Sig   acetaminophen (TYLENOL) 325 MG tablet Take 650 mg by mouth 2 (two) times daily.    acetaminophen (TYLENOL) 500 MG tablet Take 500 mg by mouth every 6 (six) hours as needed for moderate pain. Every 6hrs.   albuterol (PROVENTIL) (2.5 MG/3ML) 0.083% nebulizer solution Take 2.5 mg by nebulization as directed. Take 1 neb inhalation every 6 hours and Take 1 neb inhalation every 6 hours PRN FOR SOB AND WHEEZING   carboxymethylcellul-glycerin (LUBRICATING EYE DROPS) 0.5-0.9 % ophthalmic solution Place 1 drop into both eyes 3 (three) times daily as needed for dry eyes. For allergies   diclofenac sodium (VOLTAREN) 1 % GEL Apply 1 application topically 4 (four) times daily as  needed (pain).    LORazepam (ATIVAN) 0.5 MG tablet Take 1 tablet (0.5 mg total) by mouth every 4 (four) hours as needed for anxiety.   morphine (ROXANOL) 20 MG/ML concentrated solution Take 0.25 mLs (5 mg total) by mouth every 2 (two) hours as needed for severe pain.   nystatin cream (MYCOSTATIN) Apply 1 application topically 2 (two) times daily.    ondansetron (ZOFRAN-ODT) 4 MG disintegrating tablet Take 4 mg by mouth every 6 (six) hours as needed for nausea or vomiting.    pantoprazole (PROTONIX) 40 MG tablet Take 40 mg by mouth daily.   polyethylene glycol (MIRALAX / GLYCOLAX) packet Take 17 g by mouth daily as needed for mild constipation.    torsemide (DEMADEX) 20 MG tablet Take 20 mg by mouth daily.    triamcinolone cream (KENALOG) 0.5 % Apply 1 application topically 2 (two) times daily.   zinc oxide 20 % ointment Apply  1 application topically as needed for irritation. To buttocks after every incontinent episode and as needed for redness. May keep at bedside   No facility-administered encounter medications on file as of 04/11/2020.    PHYSICAL EXAM:   Vital Signs:  BP 70/54  P 160 apical R 32  Sats 79 room air  General: In distress, frail appearing elderly female reclining in bed Cardiovascular: rapid rate with regular rhythm Pulmonary[place] lung fields, scattered wheezing of upper lung fields.  Grunting respirations and increased work of breathing Abdomen: soft, nontender, + bowel sounds Extremities: 3+ pitting edema BLE Skin:Pale in color, patchy ecchymosis RLE.  Reports of buttock wound but did not visualize. Neurological: Somnolent but arouses easily.  Oriented x2.  Weakness   Gonzella Lex, NP-C

## 2020-04-15 ENCOUNTER — Non-Acute Institutional Stay (SKILLED_NURSING_FACILITY): Payer: Medicare PPO | Admitting: Nurse Practitioner

## 2020-04-15 ENCOUNTER — Encounter: Payer: Self-pay | Admitting: Nurse Practitioner

## 2020-04-15 DIAGNOSIS — R079 Chest pain, unspecified: Secondary | ICD-10-CM | POA: Diagnosis not present

## 2020-04-15 DIAGNOSIS — J42 Unspecified chronic bronchitis: Secondary | ICD-10-CM | POA: Diagnosis not present

## 2020-04-15 DIAGNOSIS — K5901 Slow transit constipation: Secondary | ICD-10-CM

## 2020-04-15 DIAGNOSIS — R627 Adult failure to thrive: Secondary | ICD-10-CM

## 2020-04-15 DIAGNOSIS — R0789 Other chest pain: Secondary | ICD-10-CM | POA: Diagnosis not present

## 2020-04-15 DIAGNOSIS — F0393 Unspecified dementia, unspecified severity, with mood disturbance: Secondary | ICD-10-CM

## 2020-04-15 DIAGNOSIS — F0280 Dementia in other diseases classified elsewhere without behavioral disturbance: Secondary | ICD-10-CM | POA: Diagnosis not present

## 2020-04-15 DIAGNOSIS — I5033 Acute on chronic diastolic (congestive) heart failure: Secondary | ICD-10-CM | POA: Diagnosis not present

## 2020-04-15 DIAGNOSIS — K219 Gastro-esophageal reflux disease without esophagitis: Secondary | ICD-10-CM | POA: Diagnosis not present

## 2020-04-15 DIAGNOSIS — F329 Major depressive disorder, single episode, unspecified: Secondary | ICD-10-CM | POA: Diagnosis not present

## 2020-04-15 DIAGNOSIS — M353 Polymyalgia rheumatica: Secondary | ICD-10-CM

## 2020-04-15 NOTE — Assessment & Plan Note (Signed)
MOST comfort measures, no ABT/IVF Resumed Mirtazapine 7.5mg  qd 04/12/20 per HPOA's request.

## 2020-04-15 NOTE — Assessment & Plan Note (Signed)
Comfort measures, continue prn Lorazepam.

## 2020-04-15 NOTE — Assessment & Plan Note (Signed)
DOE, continue Albuterol Neb prn.

## 2020-04-15 NOTE — Assessment & Plan Note (Signed)
PMR, chronic lower back pain, hip pain, takes Tylenol 650mg  bid, Morphine 5mg  q2h prn, Lyrica 25mg  bid.

## 2020-04-15 NOTE — Assessment & Plan Note (Signed)
Stable, continue prn MiraLax 

## 2020-04-15 NOTE — Assessment & Plan Note (Signed)
Stable, continue prn Zofran, continue daily Pantoprazole.

## 2020-04-15 NOTE — Assessment & Plan Note (Signed)
Minimal edema BLE, chronic DOE, continue Torsemide.

## 2020-04-15 NOTE — Assessment & Plan Note (Addendum)
MOST: comfort measures, no ABT/IVF, revised 04/16/20 limitation of ABT 04/15/20 HPOA son desires CXR ap/lateral, R rib X-ray, ABT if indicated. Morphine is available to her.  04/16/20 Na 138, K 3.5, Bun 40, creat 1.65, eGFR 27, alk phos 602, AST 92, ALT 64, wbc 11.5, Hgb 11.7, plt 453, neutrophils 78.3. US gallbladder, liver, pancreas.  04/17/20 revised MOST comfort measures, no IVF/ABT, declined the above testing, desires Hospice service.

## 2020-04-15 NOTE — Progress Notes (Addendum)
Location:   [facility] Room Number: 34Room 34 Place of Service:  SNF (31) [place] Provider: Lennie Odor Mizraim Harmening NP  Cheryl Dad, MD  Patient Care Team: Cheryl Dad, MD as PCP - General (Internal Medicine) Cheryl Margarita, MD as PCP - Cardiology (Cardiology) Cheryl Dad, MD as Consulting Physician (Internal Medicine) Cheryl, Nelda Bucks, NP as Nurse Practitioner (Family Medicine)  Extended Emergency Contact Information Primary Emergency Contact: Cheryl,[place] Address: [address]           [place], [place] [name] Phone: [phone] Relation: Son Secondary Emergency Contact: [name] of [place] Phone: [phone] Mobile Phone: [phone] Relation: Daughter  Code Status: DNR Goals of care: Advanced Directive information Advanced Directives 04/15/2020  Does Patient Have a Medical Advance Directive? Yes  Type of Paramedic of [place];Out of facility DNR (pink MOST or yellow form)  Does patient want to make changes to medical advance directive? No - Patient declined  Copy of [place] in Chart? Yes - validated most recent copy scanned in chart (See row information)  Would patient like information on creating a medical advance directive? -  Pre-existing out of facility DNR order (yellow form or pink MOST form) Pink MOST/Yellow Form most recent copy in chart - Physician notified to receive inpatient order     Chief Complaint  Patient presents with  . Acute Visit    Chest pain    HPI:  Pt is a 86 y.o. female seen today for an acute visit for grabbing right chest wall/ribs pain, positional and with deep breathing,   PMR, chronic lower back pain, hip pain, takes Tylenol 650m bid, Morphine 538mq2h prn, Lyrica 2560mid.   COPD, Albuterol Neb q6hrs,   Depression/anxiety, takes Lorazepam 0.5mg67mhr prn  GERD takes Pantoprazole 40mg47m Zofran 4mg q34m  prn  Constipation, takes prn MiraLax.   CHF, minimal edema BLE, takes Torsemide 40mg q49m FTT, desires resuming Mirtazapine 7.5mg qd 41m5/21    Past Medical History:  Diagnosis Date  . Amputation of finger of right hand   . Aortic stenosis, mild 03/03/2016   By echo 08/2015  . Arthritis   . Back pain   . Bradycardia 08/28/2015  . CKD (chronic kidney disease) stage 3, GFR 30-59 ml/min   . Closed right hip fracture (HCC) 03/Bells0  . Depression   . GERD (gastroesophageal reflux disease)   . GI bleed due to NSAIDs   . Hyperlipidemia   . Hypertension   . Macular degeneration   . Maxillary sinusitis   . MVP (mitral valve prolapse) 03/03/2016   Bileaflet MVP with mild MR by echo 2016  . Old MI (myocardial infarction)    NSTEMI secondary to stress MI/Takotsubo CM, minimal nonobstructive ASCAD at cath  . Osteoporosis   . Persistent atrial fibrillation (HCC) 200Sylvia not on anticoagulation due to GI bleed and increased fall risk  . PMR (polymyalgia rheumatica) (HCC)   . SIADH (syndrome of inappropriate ADH production) (HCC)   .Ligonierkotsubo cardiomyopathy    EF normalized to 70%  . Ventricular tachycardia (HCC)    Phelpsinitial presentation of MI   Past Surgical History:  Procedure Laterality Date  . CARDIAC CATHETERIZATION     normal coronary arteries  . CHOLECYSTECTOMY  11/10/2011   Procedure: LAPAROSCOPIC CHOLECYSTECTOMY WITH INTRAOPERATIVE CHOLANGIOGRAM;  Surgeon: Matthew Pedro Earlsocation: [facility];  Service: General;  Laterality: N/A;  . EYE SURGERY  06/10/11   membrane peel   . INTRAMEDULLARY (IM) NAIL INTERTROCHANTERIC Right 12/21/2018   Procedure: INTRAMEDULLARY (IM) NAIL INTERTROCHANTRIC;  Surgeon: Nicholes Stairs, MD;  Location: [facility];  Service: Orthopedics;  Laterality: Right;  . NASAL SINUS SURGERY Left   . ROTATOR CUFF REPAIR  1998  . UMBILICAL HERNIA REPAIR  11/10/2011   Procedure: HERNIA REPAIR UMBILICAL ADULT;  Surgeon: Pedro Earls, MD;  Location: [facility];   Service: General;  Laterality: N/A;  . WISDOM TOOTH EXTRACTION      Allergies  Allergen Reactions  . Amlodipine Swelling    To feet and ankles   . Avelox [Moxifloxacin Hcl In Nacl] Other (See Comments)    Pt does not remember   . Biaxin [Clarithromycin]   . Ceftin [Cefuroxime Axetil]   . Duragesic Disc Transdermal System [Fentanyl] Other (See Comments)    Reaction unknown  . Forteo [Parathyroid Hormone (Recomb)] Other (See Comments)    Made skin dry  . Morphine And Related Other (See Comments)    "My Sister and daughter can,t take it. I've had it before without problems."  . Teriparatide   . Lidocaine Rash    Allergies as of 04/15/2020      Reactions   Amlodipine Swelling   To feet and ankles    Avelox [moxifloxacin Hcl In Nacl] Other (See Comments)   Pt does not remember   Biaxin [clarithromycin]    Ceftin [cefuroxime Axetil]    Duragesic Disc Transdermal System [fentanyl] Other (See Comments)   Reaction unknown   Forteo [parathyroid Hormone (recomb)] Other (See Comments)   Made skin dry   Morphine And Related Other (See Comments)   "My Sister and daughter can,t take it. I've had it before without problems."   Teriparatide    Lidocaine Rash      Medication List       Accurate as of April 15, 2020 11:59 PM. If you have any questions, ask your nurse or doctor.        STOP taking these medications   ferrous sulfate 325 (65 FE) MG EC tablet Stopped by: Devaunte Gasparini X Glenroy Crossen, NP   pregabalin 25 MG capsule Commonly known as: LYRICA Stopped by: Trecia Maring X Demisha Nokes, NP     TAKE these medications   acetaminophen 500 MG tablet Commonly known as: TYLENOL Take 500 mg by mouth every 6 (six) hours as needed for moderate pain. Every 6hrs. What changed: Another medication with the same name was removed. Continue taking this medication, and follow the directions you see here. Changed by: Ticara Waner X Leary Mcnulty, NP   albuterol (2.5 MG/3ML) 0.083% nebulizer solution Commonly known as: PROVENTIL Take  2.5 mg by nebulization as directed. Take 1 neb inhalation every 6 hours and Take 1 neb inhalation every 6 hours PRN FOR SOB AND WHEEZING   azelastine 0.1 % nasal spray Commonly known as: ASTELIN Place 2 sprays into both nostrils daily. Use in each nostril as directed   diclofenac sodium 1 % Gel Commonly known as: VOLTAREN Apply 1 application topically 4 (four) times daily as needed (pain).   fluticasone 50 MCG/ACT nasal spray Commonly known as: FLONASE Place 2 sprays into both nostrils daily.   LORazepam 0.5 MG tablet Commonly known as: ATIVAN Take 1 tablet (0.5 mg total) by mouth every 4 (four) hours as needed for anxiety.   Lubricating Eye Drops 0.5-0.9 % ophthalmic solution Generic drug: carboxymethylcellul-glycerin Place 1 drop into both eyes 3 (three)  times daily as needed for dry eyes. For allergies   morphine 20 MG/ML concentrated solution Commonly known as: ROXANOL Take 0.25 mLs (5 mg total) by mouth every 2 (two) hours as needed for severe pain.   nystatin cream Commonly known as: MYCOSTATIN Apply 1 application topically 2 (two) times daily.   ondansetron 4 MG disintegrating tablet Commonly known as: ZOFRAN-ODT Take 4 mg by mouth every 6 (six) hours as needed for nausea or vomiting.   pantoprazole 40 MG tablet Commonly known as: PROTONIX Take 40 mg by mouth daily.   polyethylene glycol 17 g packet Commonly known as: MIRALAX / GLYCOLAX Take 17 g by mouth daily as needed for mild constipation.   torsemide 20 MG tablet Commonly known as: DEMADEX Take 40 mg by mouth daily.   triamcinolone cream 0.5 % Commonly known as: KENALOG Apply 1 application topically 2 (two) times daily.   zinc oxide 20 % ointment Apply 1 application topically as needed for irritation. To buttocks after every incontinent episode and as needed for redness. May keep at bedside       Review of Systems  Constitutional: Positive for appetite change and fatigue. Negative for fever.        Lethargic  HENT: Positive for hearing loss. Negative for congestion and voice change.   Eyes: Negative for visual disturbance.  Respiratory: Positive for cough and shortness of breath. Negative for wheezing.        DOE  Cardiovascular: Positive for chest pain and leg swelling. Negative for palpitations.  Gastrointestinal: Negative for abdominal pain and constipation.       C/o nauseated this morning, but better now, did not vomit, c/o loose stools.   Genitourinary: Negative for difficulty urinating, dysuria and urgency.  Musculoskeletal: Positive for arthralgias, back pain, gait problem and myalgias.       Right hip, lower back. C/o right chest wall pain, grabbing, positional, deep breathing.   Skin: Negative for color change.  Neurological: Negative for dizziness, facial asymmetry, speech difficulty and weakness.       More confused.   Psychiatric/Behavioral: Positive for confusion. Negative for sleep disturbance.    Immunization History  Administered Date(s) Administered  . DTaP 12/14/2011  . Influenza, High Dose Seasonal PF 07/12/2017, 08/03/2019  . Moderna SARS-COVID-2 Vaccination 10/23/2019, 11/18/2019  . Pneumococcal Conjugate-13 06/01/2014  . Pneumococcal Polysaccharide-23 06/11/1999   Pertinent  Health Maintenance Due  Topic Date Due  . DEXA SCAN  Never done  . INFLUENZA VACCINE  05/19/2020  . PNA vac Low Risk Adult  Completed   No flowsheet data found. Functional Status Survey:    Vitals:   04/15/20 1622  BP: 110/60  Pulse: 72  Resp: 18  Temp: (!) 97.5 F (36.4 C)  SpO2: 97%  Weight: 132 lb 6.4 oz (60.1 kg)  Height: '4\' 6"'  (1.372 m)   Body mass index is 31.92 kg/m. Physical Exam Vitals and nursing note reviewed.  Constitutional:      General: She is not in acute distress.    Appearance: She is not ill-appearing.     Comments: frail  HENT:     Head: Normocephalic and atraumatic.     Mouth/Throat:     Mouth: Mucous membranes are dry.  Eyes:      Conjunctiva/sclera: Conjunctivae normal.     Pupils: Pupils are equal, round, and reactive to light.  Cardiovascular:     Rate and Rhythm: Normal rate and regular rhythm.     Heart sounds: Murmur heard.  Comments: HR in 60 upon my examination Pulmonary:     Breath sounds: Rales present. No wheezing or rhonchi.     Comments: Decreased air entry both lungs. Posterior mid to lower lung moist rales.  Abdominal:     General: Bowel sounds are normal.     Tenderness: There is no abdominal tenderness.  Musculoskeletal:     Cervical back: Normal range of motion and neck supple.     Right lower leg: Edema present.     Left lower leg: Edema present.     Comments: trace edema BLE. Chronic pain in the lower back, right hip. Right chest wall/ribs pain not palpated.   Skin:    General: Skin is warm and dry.     Comments: Multiple ecchymoses all limbs  Neurological:     General: No focal deficit present.     Mental Status: She is alert.     Motor: No weakness.     Gait: Gait abnormal.     Comments: Oriented to person, place.   Psychiatric:        Mood and Affect: Mood normal.        Behavior: Behavior normal.     Labs reviewed: Recent Labs    03/28/20 0140 03/29/20 1506 04/01/20 0425 04/01/20 0425 04/02/20 0441 04/03/20 0240 04/09/20 0000  NA 145   < > 147*   < > 145 142 138  K 4.0   < > 3.9   < > 3.5 3.3* 3.6  CL 107   < > 107   < > 107 104 100  CO2 30   < > 28   < > 27 28 29*  GLUCOSE 54*   < > 106*  --  84 85  --   BUN 68*   < > 21   < > 26* 38* 54*  CREATININE 1.73*   < > 0.82   < > 0.99 1.24* 1.3*  CALCIUM 8.3*   < > 8.3*   < > 8.2* 7.9* 7.7*  MG 2.2  --   --   --   --   --   --   PHOS 4.2  --   --   --   --   --   --    < > = values in this interval not displayed.   Recent Labs    11/23/19 0000 11/23/19 0000 12/26/19 0000 03/27/20 0000 04/09/20 0000  AST 15   < > 16 32 27  ALT 13   < > 15 34 30  ALKPHOS 82   < > 108 201* 169*  ALBUMIN 3.0*  --  3.0* 2.9*  --     < > = values in this interval not displayed.   Recent Labs    04/01/20 0425 04/01/20 0425 04/02/20 0441 04/03/20 0240 04/09/20 0000  WBC 14.3*   < > 20.3* 21.0* 12.4  NEUTROABS  --   --  16.8* 17.8* 10,292  HGB 12.9   < > 12.7 11.2* 9.9*  HCT 40.2   < > 39.3 34.0* 30*  MCV 97.3  --  94.9 93.9  --   PLT 143*   < > 156 171 457*   < > = values in this interval not displayed.   Lab Results  Component Value Date   TSH 2.600 09/19/2018   No results found for: HGBA1C No results found for: CHOL, HDL, LDLCALC, LDLDIRECT, TRIG, CHOLHDL  Significant Diagnostic Results in last 30 days:  CT HEAD WO CONTRAST  Result Date: 03/27/2020 CLINICAL DATA:  86 year old female with encephalopathy. EXAM: CT HEAD WITHOUT CONTRAST TECHNIQUE: Contiguous axial images were obtained from the base of the skull through the vertex without intravenous contrast. COMPARISON:  Head CT 11/14/2005. FINDINGS: Brain: No midline shift, mass effect, or evidence of intracranial mass lesion. Cerebral volume is within normal limits for age. No ventriculomegaly. No acute intracranial hemorrhage identified. Widespread, patchy hypodensity in the bilateral cerebral white matter, deep gray nuclei. Progression since 2007. No acute cortically based infarct or cortical encephalomalacia identified. Vascular: Calcified atherosclerosis at the skull base. No suspicious intracranial vascular hyperdensity. Skull: Advanced chronic degeneration at the skull base-C1 and odontoid. Associated cervicomedullary junction stenosis (series 3, image 1). No acute osseous abnormality identified. Sinuses/Orbits: Visualized paranasal sinuses and mastoids are stable and well pneumatized. Other: No acute orbit or scalp soft tissue finding. IMPRESSION: 1. No acute intracranial abnormality. Evidence of small vessel disease with progression since 2007. 2. Very severe degeneration of the skull base, C1, and odontoid with cervicomedullary junction stenosis.  Electronically Signed   By: Genevie Ann M.D.   On: 03/27/2020 21:23   DG CHEST PORT 1 VIEW  Result Date: 04/01/2020 CLINICAL DATA:  Shortness of breath, altered mental status EXAM: PORTABLE CHEST 1 VIEW COMPARISON:  03/27/2020 FINDINGS: Stable cardiomediastinal contours. Atherosclerotic calcification of the aortic knob. Small left pleural effusion with associated left basilar opacity similar in appearance to prior. Right lung appears clear. No pneumothorax. Demineralized osseous structures with chronic arthropathy of the bilateral shoulders and numerous prior kyphoplasty/vertebroplasty changes of the visualized thoracic spine. IMPRESSION: Small left pleural effusion with associated left basilar opacity, similar in appearance to prior. Electronically Signed   By: Davina Poke D.O.   On: 04/01/2020 11:23   DG Chest Portable 1 View  Result Date: 03/27/2020 CLINICAL DATA:  Shortness of breath EXAM: PORTABLE CHEST 1 VIEW COMPARISON:  Feb 20, 2019 FINDINGS: There is airspace opacity in the left base with small left pleural effusion. The lungs elsewhere are clear. The heart is upper normal in size with pulmonary vascularity normal. There is aortic atherosclerosis. No adenopathy appreciable. There have been previous kyphoplasty procedures in the upper lumbar region. There is arthropathy in each shoulder with superior migration of each humeral head. Bones are osteoporotic. IMPRESSION: Airspace consolidation consistent with pneumonia left base with small left pleural effusion. Lungs elsewhere clear. Stable cardiac prominence. Aortic Atherosclerosis (ICD10-I70.0). Bones osteoporotic. Probable chronic rotator cuff tears bilaterally, characterized by superior migration of each humeral head. Electronically Signed   By: Lowella Grip III M.D.   On: 03/27/2020 15:04   ECHOCARDIOGRAM COMPLETE  Result Date: 04/02/2020    ECHOCARDIOGRAM REPORT   Patient Name:   [name] Date of Exam: 04/02/2020 Medical Rec  #:  [id number]                Height:       54.0 in Accession #:    [id number]               Weight:       135.0 lb Date of Birth:  03/13/1923                 BSA:          1.464 m Patient Age:    89 years                 BP:           120/50 mmHg  Patient Gender: F                        HR:           86 bpm. Exam Location:  Inpatient Procedure: 2D Echo Indications:    CHF- Acute Diastolic W73.71  History:        Patient has prior history of Echocardiogram examinations, most                 recent 09/28/2018. Cardiomyopathy, Previous Myocardial                 Infarction, Aortic Valve Disease, Arrythmias:Atrial                 Fibrillation; Risk Factors:Hypertension.  Sonographer:    [name] RDCS (AE) Referring Phys: [place]  1. There is systolic left ventricular cavity obliteration with a mid chamber "gradient" up to 3 m/s. There is no evidence of systolic anterior motion of the mitral leaflet or true subvalvular dynamic obstruction and there are minimal degenerative valve leaflet changes. . Left ventricular ejection fraction, by estimation, is >75%. The left ventricle has hyperdynamic function. The left ventricle has no regional wall motion abnormalities. Left ventricular diastolic parameters are consistent with Grade I diastolic dysfunction (impaired relaxation).  2. Right ventricular systolic function is normal. The right ventricular size is normal. There is normal pulmonary artery systolic pressure. The estimated right ventricular systolic pressure is 06.2 mmHg.  3. The mitral valve is normal in structure. Mild mitral valve regurgitation.  4. Mildly increased aortic valve gradients due to hyperdynamic left ventricular function. The aortic valve is normal in structure. Aortic valve regurgitation is not visualized. No aortic stenosis is present.  5. The inferior vena cava is normal in size with greater than 50% respiratory variability, suggesting right atrial pressure of 3 mmHg.  Comparison(s): No significant change from prior study. Prior images reviewed side by side. FINDINGS  Left Ventricle: There is systolic left ventricular cavity obliteration with a mid chamber "gradient" up to 3 m/s. There is no evidence of systolic anterior motion of the mitral leaflet or true subvalvular dynamic obstruction and there are minimal degenerative valve leaflet changes. Left ventricular ejection fraction, by estimation, is >75%. The left ventricle has hyperdynamic function. The left ventricle has no regional wall motion abnormalities. The left ventricular internal cavity size was small. There is no left ventricular hypertrophy. Left ventricular diastolic parameters are consistent with Grade I diastolic dysfunction (impaired relaxation). Indeterminate filling pressures. Right Ventricle: The right ventricular size is normal. No increase in right ventricular wall thickness. Right ventricular systolic function is normal. There is normal pulmonary artery systolic pressure. The tricuspid regurgitant velocity is 2.74 m/s, and  with an assumed right atrial pressure of 3 mmHg, the estimated right ventricular systolic pressure is 69.4 mmHg. Left Atrium: Left atrial size was normal in size. Right Atrium: Right atrial size was normal in size. Pericardium: There is no evidence of pericardial effusion. Mitral Valve: The mitral valve is normal in structure. There is mild late systolic prolapse of both leaflets of the mitral valve. Mild mitral valve regurgitation. Tricuspid Valve: The tricuspid valve is normal in structure. Tricuspid valve regurgitation is trivial. Aortic Valve: Mildly increased aortic valve gradients due to hyperdynamic left ventricular function. The aortic valve is normal in structure. Aortic valve regurgitation is not visualized. No aortic stenosis is present. Aortic valve mean gradient measures  8.0 mmHg. Aortic valve peak  gradient measures 20.1 mmHg. Aortic valve area, by VTI measures 1.69 cm.  Pulmonic Valve: The pulmonic valve was grossly normal. Pulmonic valve regurgitation is trivial. Aorta: The aortic root was not well visualized. Venous: The inferior vena cava is normal in size with greater than 50% respiratory variability, suggesting right atrial pressure of 3 mmHg. IAS/Shunts: No atrial level shunt detected by color flow Doppler.  LEFT VENTRICLE PLAX 2D LVIDd:         2.80 cm  Diastology LVIDs:         2.00 cm  LV e' lateral:   5.87 cm/s LV PW:         1.00 cm  LV E/e' lateral: 12.1 LV IVS:        1.10 cm  LV e' medial:    3.92 cm/s LVOT diam:     2.00 cm  LV E/e' medial:  18.1 LV SV:         63 LV SV Index:   43 LVOT Area:     3.14 cm  RIGHT VENTRICLE TAPSE (M-mode): 1.3 cm LEFT ATRIUM           Index       RIGHT ATRIUM           Index LA diam:      1.80 cm 1.23 cm/m  RA Area:     11.00 cm LA Vol (A2C): 11.1 ml 7.58 ml/m  RA Volume:   20.80 ml  14.21 ml/m LA Vol (A4C): 40.5 ml 27.67 ml/m  AORTIC VALVE AV Area (Vmax):    1.50 cm AV Area (Vmean):   1.58 cm AV Area (VTI):     1.69 cm AV Vmax:           224.00 cm/s AV Vmean:          131.000 cm/s AV VTI:            0.371 m AV Peak Grad:      20.1 mmHg AV Mean Grad:      8.0 mmHg LVOT Vmax:         107.00 cm/s LVOT Vmean:        65.700 cm/s LVOT VTI:          0.199 m LVOT/AV VTI ratio: 0.54  AORTA Ao Root diam: 2.40 cm MITRAL VALVE                TRICUSPID VALVE MV Area (PHT): 3.54 cm     TR Peak grad:   30.0 mmHg MV Decel Time: 214 msec     TR Vmax:        274.00 cm/s MV E velocity: 71.10 cm/s MV A velocity: 174.00 cm/s  SHUNTS MV E/A ratio:  0.41         Systemic VTI:  0.20 m                             Systemic Diam: 2.00 cm Dani Gobble Croitoru MD Electronically signed by Sanda Klein MD Signature Date/Time: 04/02/2020/2:19:17 PM    Final    DG Hip Port Unilat W or Wo Pelvis 1 View Right  Result Date: 03/27/2020 CLINICAL DATA:  Recent hip surgery, multiple falls EXAM: DG HIP (WITH OR WITHOUT PELVIS) 1V PORT RIGHT COMPARISON:  12/21/2018  FINDINGS: Frontal view of the pelvis as well as frontal view of the right hip are obtained. Since the prior exam, intramedullary rod with proximal dynamic and distal  interlocking screw has been placed across the previously seen intertrochanteric right hip fracture. There has been bony resorption of the femoral head, with migration of the dynamic screw now resulting in acetabular roof erosion. Residual fracture line can be seen along the remaining portion of the femoral neck consistent with nonunion. No acute bony abnormality.  Stable left hip osteoarthritis. IMPRESSION: 1. 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 Bony resorption of the majority of the right femoral head, with bony erosion of the acetabular roof by the dynamic screw. 2. Stable left hip osteoarthritis. Electronically Signed   By: Randa Ngo M.D.   On: 03/27/2020 15:29    Assessment/Plan: Right-sided chest wall pain MOST: comfort measures, no ABT/IVF, revised 04/16/20 limitation of ABT 04/15/20 HPOA son desires CXR ap/lateral, R rib X-ray, ABT if indicated. Morphine is available to her.  04/16/20 Na 138, K 3.5, Bun 40, creat 1.65, eGFR 27, alk phos 602, AST 92, ALT 64, wbc 11.5, Hgb 11.7, plt 453, neutrophils 78.3. US gallbladder, liver, pancreas.  04/17/20 revised MOST comfort measures, no IVF/ABT, declined the above testing, desires Hospice service.    Polymyalgia rheumatica PMR, chronic lower back pain, hip pain, takes Tylenol 615m bid, Morphine 542mq2h prn, Lyrica 2567mid.    Acute on chronic diastolic CHF (congestive heart failure) (HCC) Minimal edema BLE, chronic DOE, continue Torsemide.   Chronic bronchitis (HCC) DOE, continue Albuterol Neb prn.   Esophageal reflux Stable, continue prn Zofran, continue daily Pantoprazole.   Slow transit constipation Stable, continue prn MiraLax.   Depression due to dementia ([facility] Center[facility]fort measures, continue prn Lorazepam.   Adult failure to  thrive MOST comfort measures, no ABT/IVF Resumed Mirtazapine 7.5mg86m 04/12/20 per HPOA's request.     Family/ staff Communication: plan of care reviewed with the patient, the patient's HPOA son, and charge nurse.   Labs/tests ordered: none  Time spend 35 minutes.

## 2020-04-16 ENCOUNTER — Encounter: Payer: Self-pay | Admitting: Nurse Practitioner

## 2020-04-16 DIAGNOSIS — I10 Essential (primary) hypertension: Secondary | ICD-10-CM | POA: Diagnosis not present

## 2020-04-16 NOTE — Assessment & Plan Note (Addendum)
04/15/20 CXR pulmonary infiltrate in the left lung base and small pleural effusion, CHF vs PNA. Completed Keflex for left lower lobe PNA/UTI about 10 days ago. Monitor for s/s PNA. Increase Torsemide 40mg  qam 10mg  q2pm, BMP one week  Revised MOST comfort measures, limitation of ABT, no IVF

## 2020-05-19 DEATH — deceased

## 2020-09-13 IMAGING — CT CT HEAD W/O CM
4 series · 16 of 47 positions shown, 18 images · non-contrast
Comparison: Head CT 11/14/2005.

CLINICAL DATA: [AGE] female with encephalopathy.

EXAM:
CT HEAD WITHOUT CONTRAST
TECHNIQUE: Contiguous axial images were obtained from the base of the skull
through the vertex without intravenous contrast.

[Series 3: head wo · axial · 0.45mm/px · z∈[-165,-50]mm · 7 of 31 slices shown, 9 images]
[im 4/31  brain]
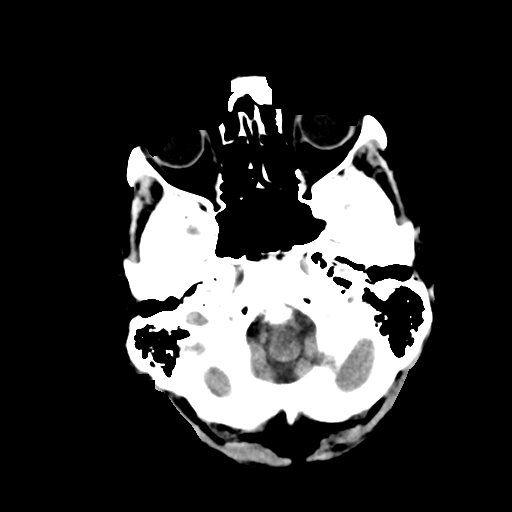
[im 4/31  bone]
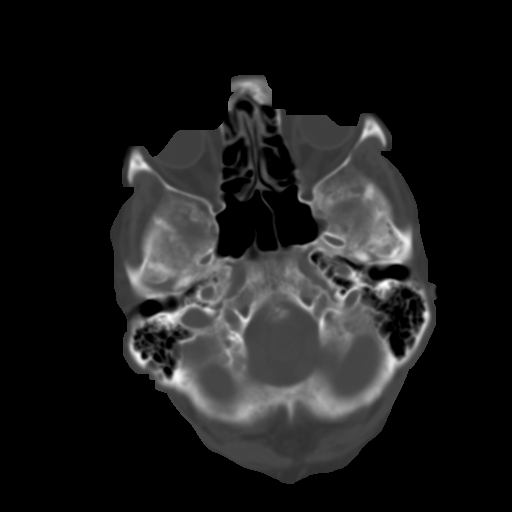
[im 8/31  brain]
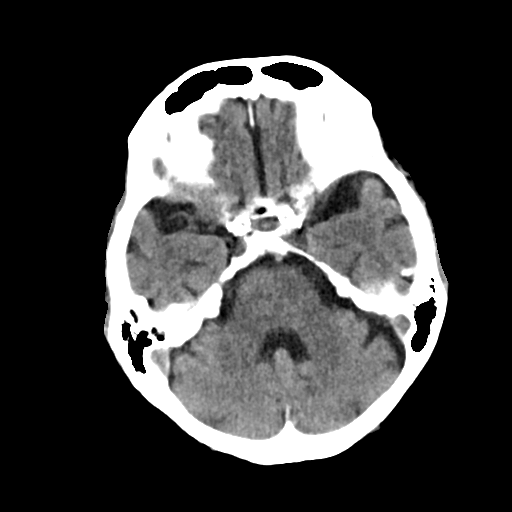
[im 12/31  brain]
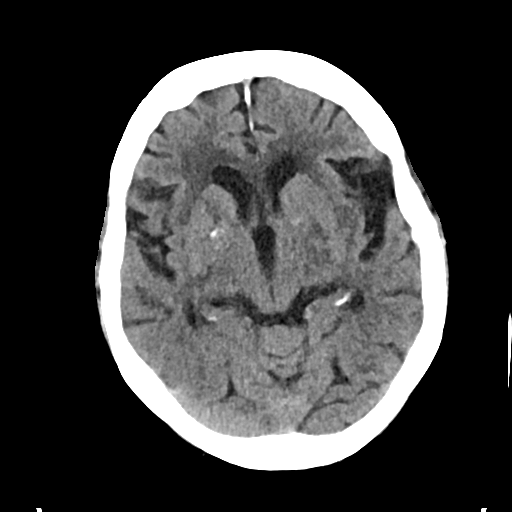
[im 16/31  brain]
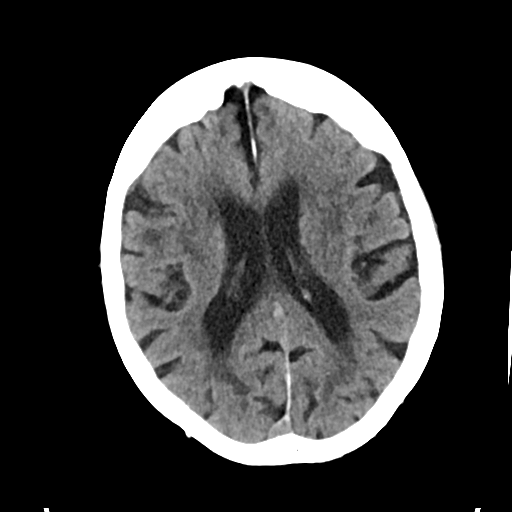
[im 19/31  brain]
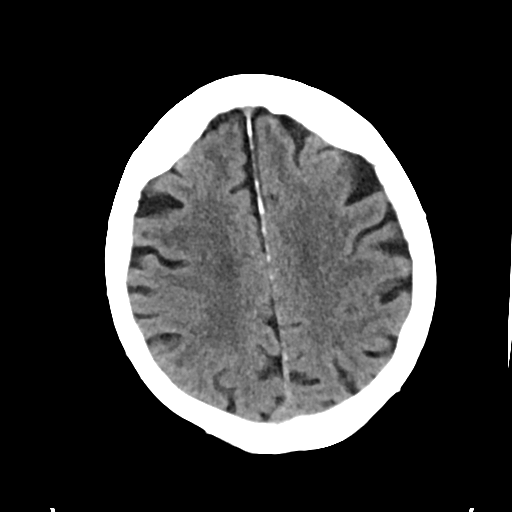
[im 19/31  bone]
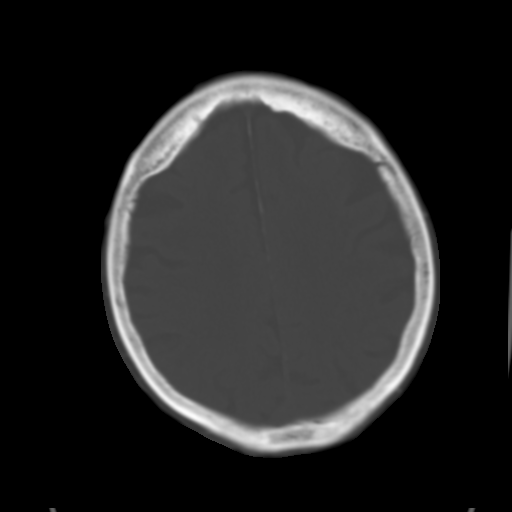
[im 23/31  brain]
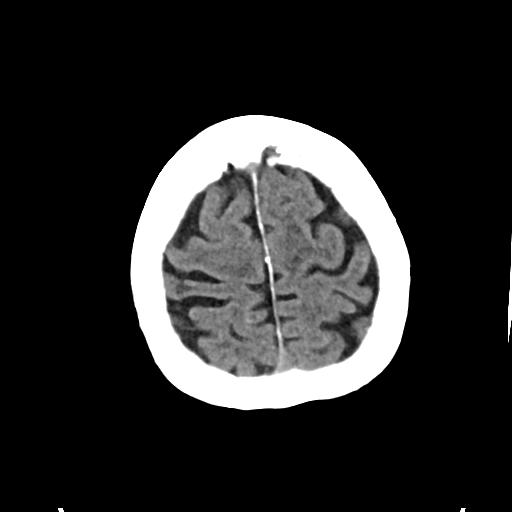
[im 27/31  brain]
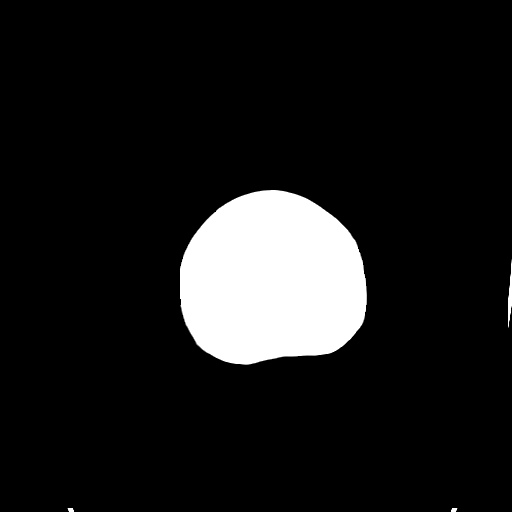

[Series 4: head bone · axial · 0.45mm/px · z∈[-166,-136]mm · 3 of 76 slices shown]
[im 8/76  bone]
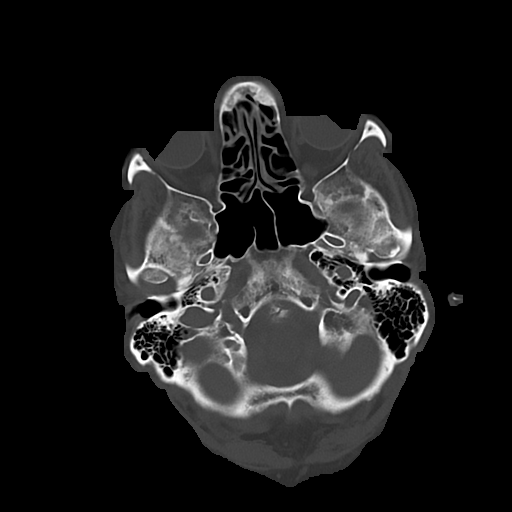
[im 16/76  bone]
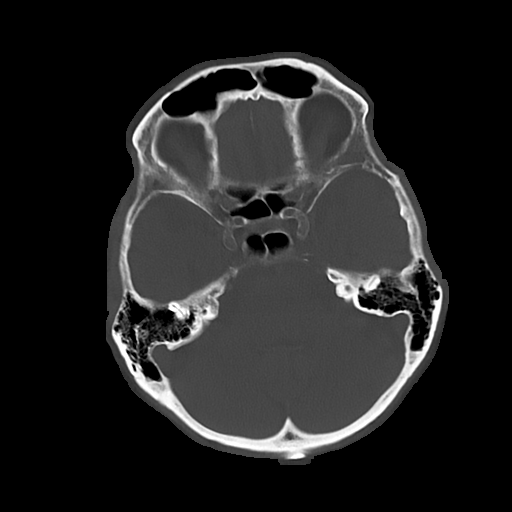
[im 23/76  bone]
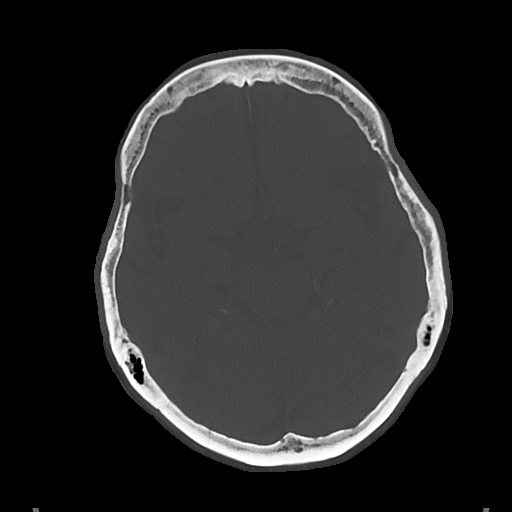

[Series 5: cor soft · coronal · 0.34mm/px · 3 of 69 slices shown]
[im 23/69  brain]
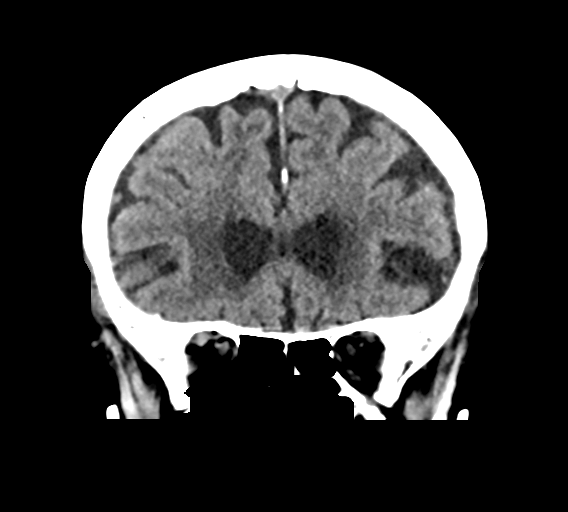
[im 31/69  brain]
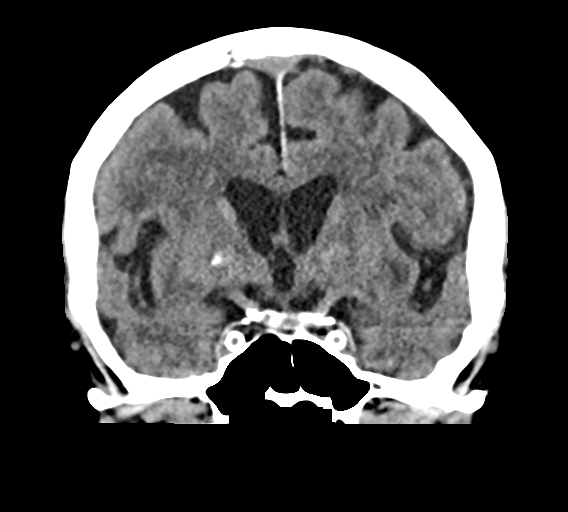
[im 38/69  brain]
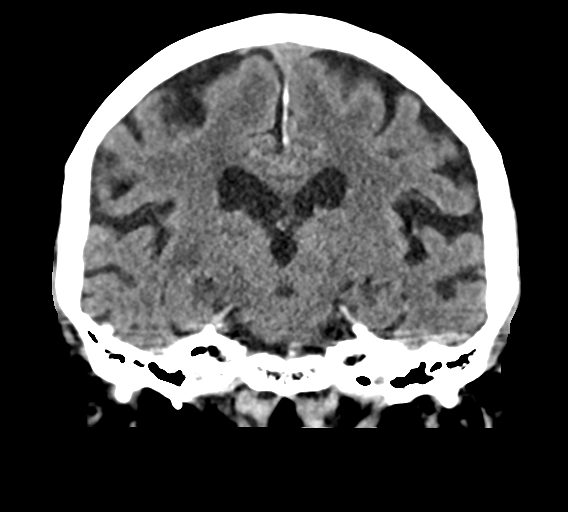

[Series 6: sag soft · sagittal · 0.33mm/px · 3 of 63 slices shown]
[im 21/63  brain]
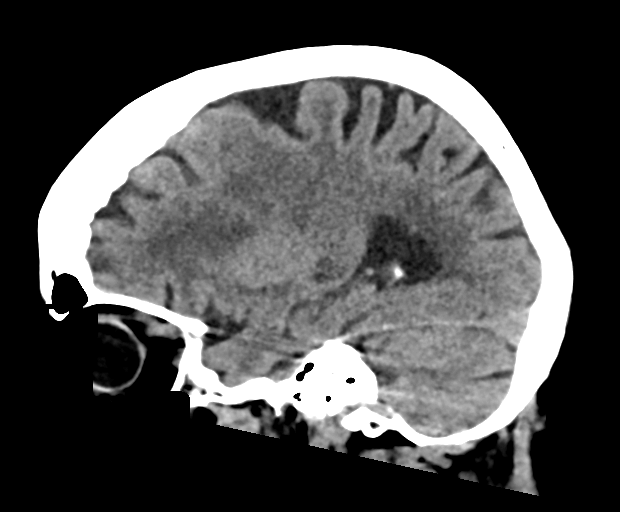
[im 32/63  brain]
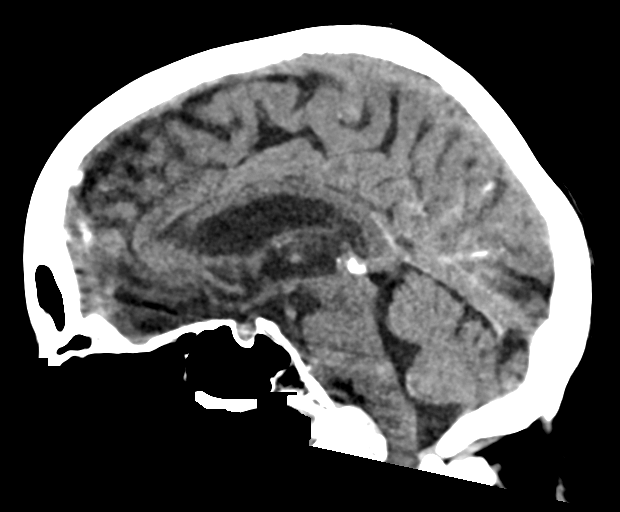
[im 42/63  brain]
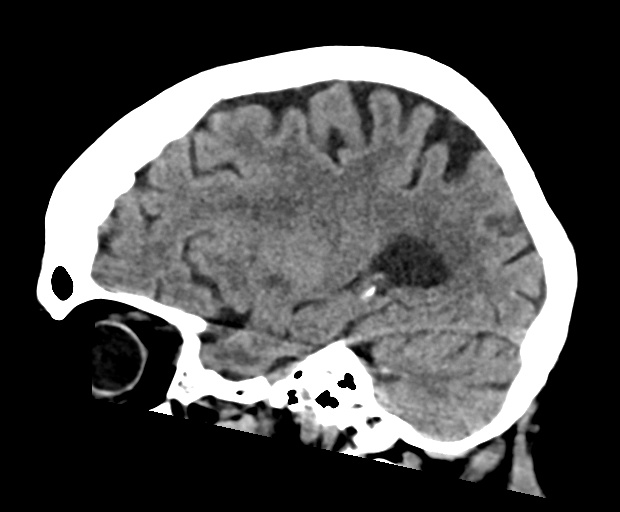

[16 of 47 positions shown; findings below may reference images not displayed]

FINDINGS: Brain: No midline shift, mass effect, or evidence of intracranial
mass lesion. Cerebral volume is within normal limits for age. No
ventriculomegaly. No acute intracranial hemorrhage identified.

Widespread, patchy hypodensity in the bilateral cerebral white
matter, deep gray nuclei. Progression since 9335. No acute
cortically based infarct or cortical encephalomalacia identified.

Vascular: Calcified atherosclerosis at the skull base. No suspicious
intracranial vascular hyperdensity.

Skull: Advanced chronic degeneration at the skull base-6L and
odontoid. Associated cervicomedullary junction stenosis (series 3,
image 1). No acute osseous abnormality identified.

Sinuses/Orbits: Visualized paranasal sinuses and mastoids are stable
and well pneumatized.

Other: No acute orbit or scalp soft tissue finding.
IMPRESSION: 1. No acute intracranial abnormality. Evidence of small vessel
disease with progression since [DATE]. Very severe degeneration of the skull base, C1, and odontoid with
cervicomedullary junction stenosis.

## 2020-09-13 IMAGING — DX DG HIP (WITH OR WITHOUT PELVIS) 1V PORT*R*
3 series · 3 of 3 positions shown · non-contrast
Comparison: 12/21/2018

CLINICAL DATA: Recent hip surgery, multiple falls

EXAM:
DG HIP (WITH OR WITHOUT PELVIS) 1V PORT RIGHT

[pelvis ap (1 of 2)]
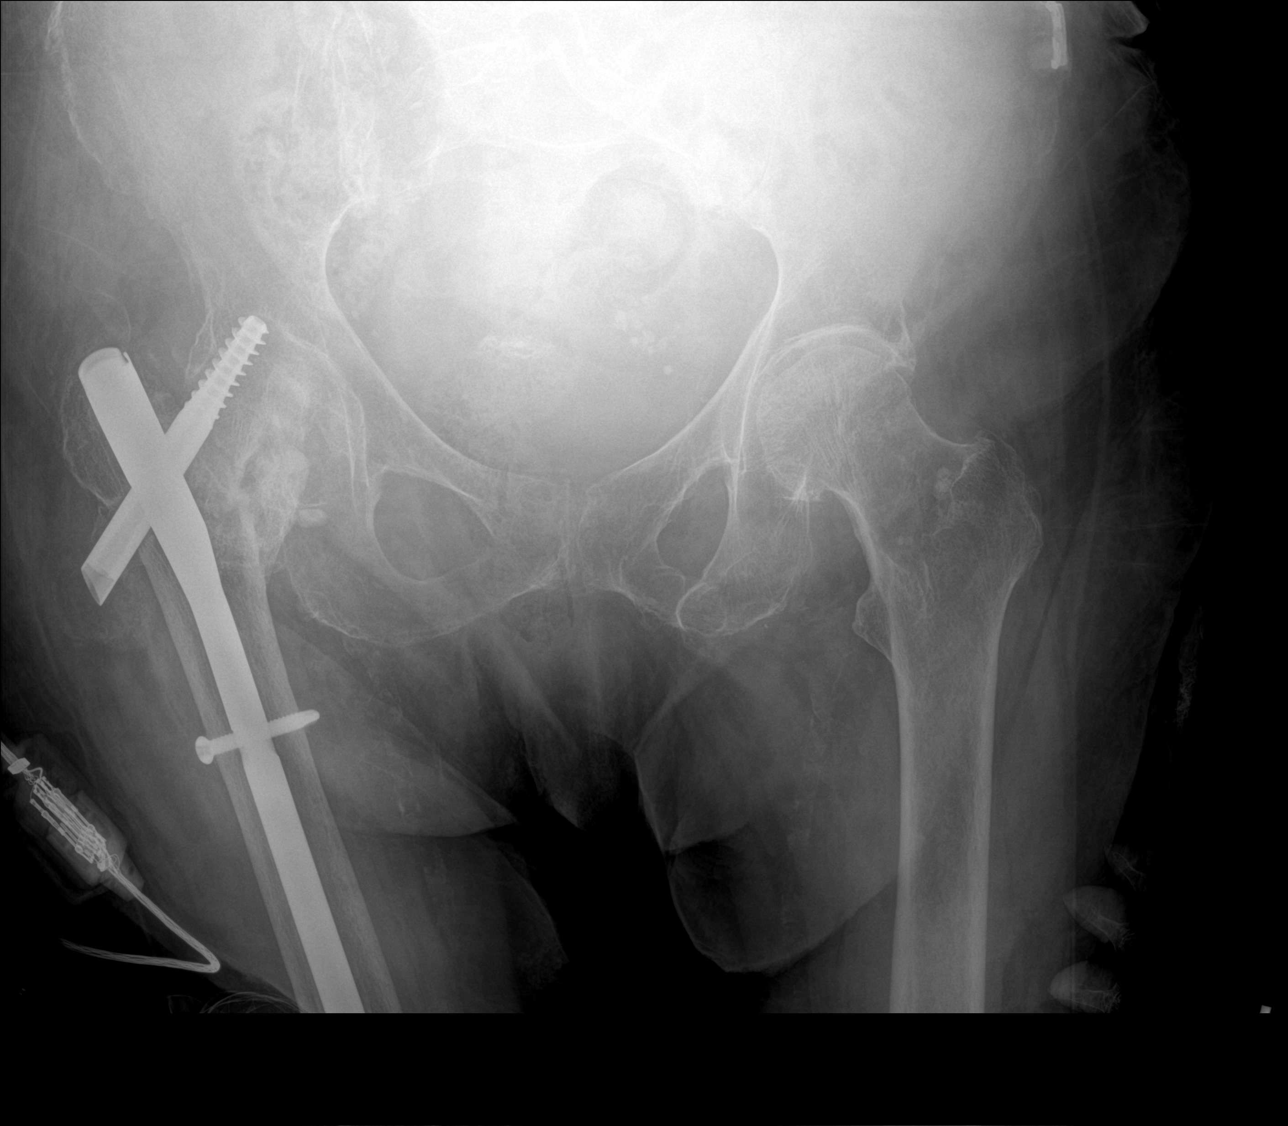

[hip frog leg]
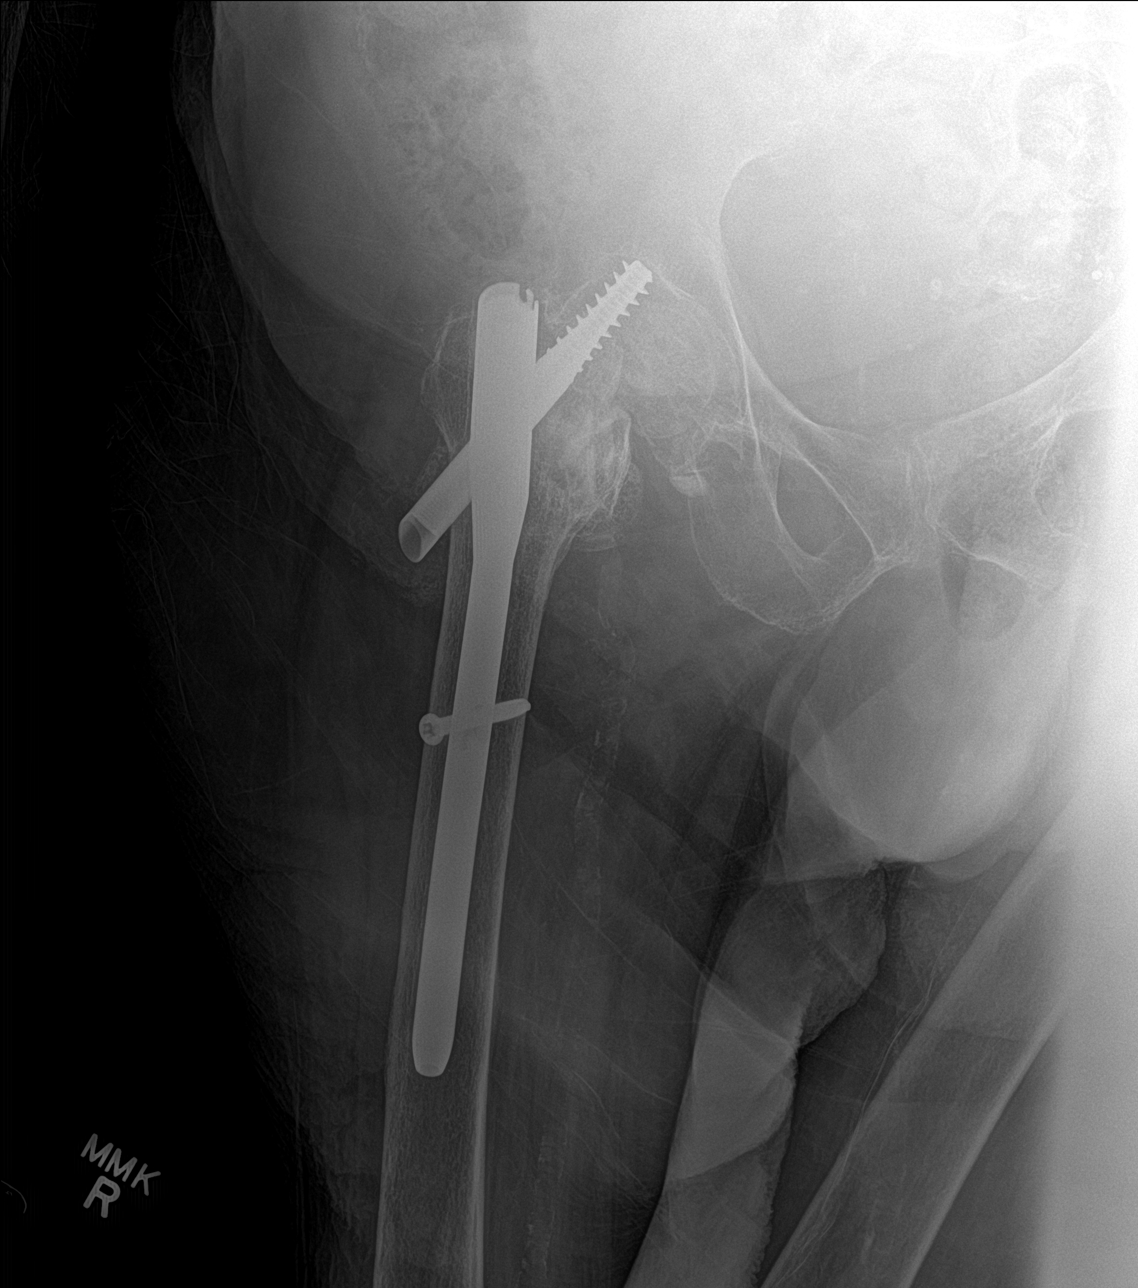

[pelvis ap (2 of 2)]
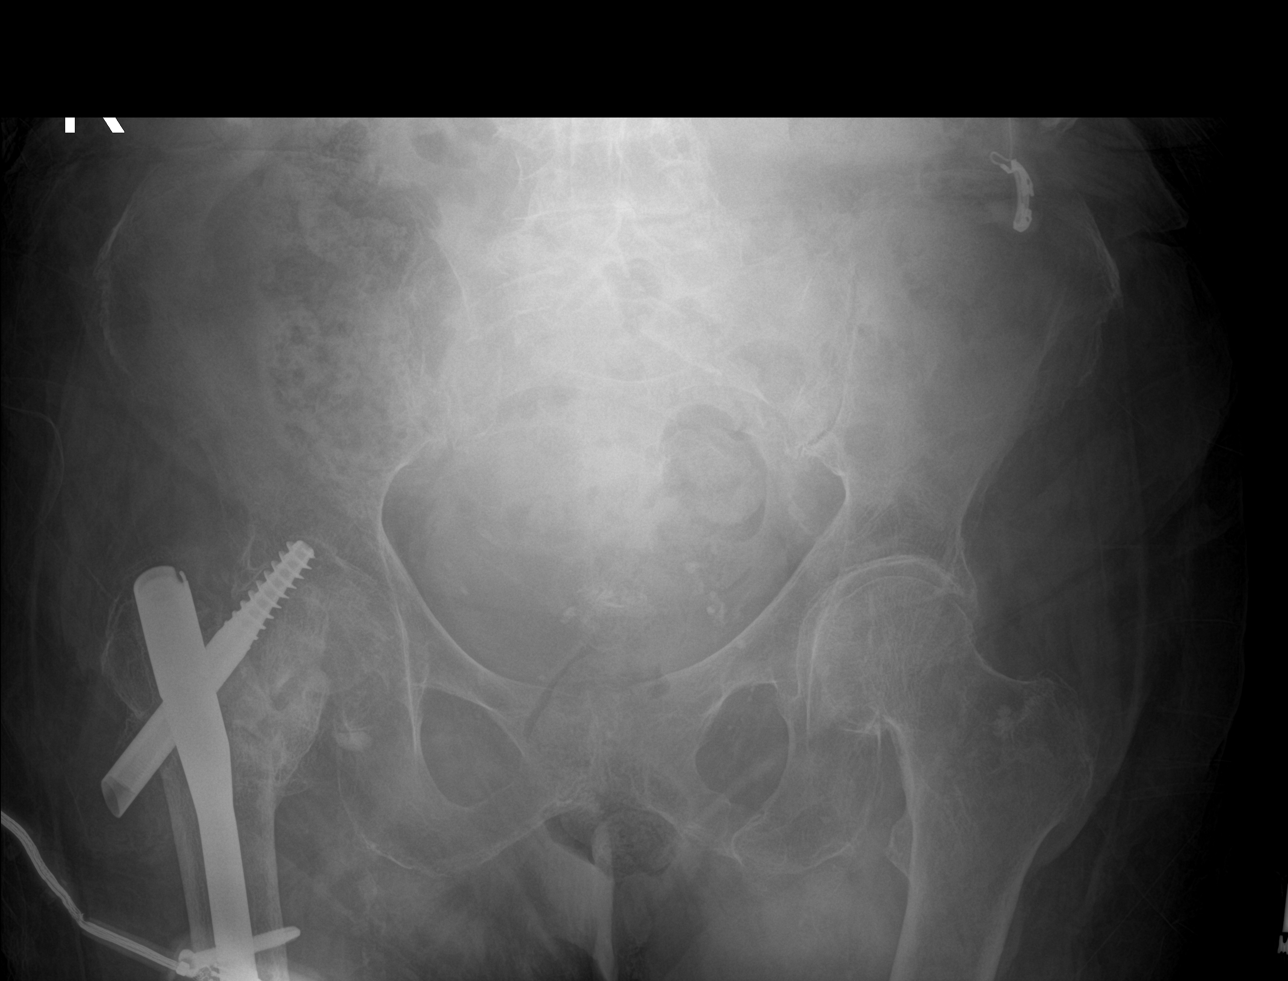

[3 of 3 positions shown; findings below may reference images not displayed]

FINDINGS: Frontal view of the pelvis as well as frontal view of the right hip
are obtained. Since the prior exam, intramedullary rod with proximal
dynamic and distal interlocking screw has been placed across the
previously seen intertrochanteric right hip fracture. There has been
bony resorption of the femoral head, with migration of the dynamic
screw now resulting in acetabular roof erosion. Residual fracture
line can be seen along the remaining portion of the femoral neck
consistent with nonunion.

No acute bony abnormality.  Stable left hip osteoarthritis.
IMPRESSION: 1. Nonunion of the previous intertrochanteric right hip fracture
just plate ORIF. Bony resorption of the majority of the right
femoral head, with bony erosion of the acetabular roof by the
dynamic screw.
2. Stable left hip osteoarthritis.
